# Patient Record
Sex: Male | Born: 1940 | Race: White | Hispanic: No | Marital: Married | Smoking: Former smoker
Health system: Southern US, Community
[De-identification: ages and names within clinical notes are randomized; demographics above are authoritative.]

## PROBLEM LIST (undated history)

## (undated) DIAGNOSIS — E785 Hyperlipidemia, unspecified: Secondary | ICD-10-CM

## (undated) DIAGNOSIS — E119 Type 2 diabetes mellitus without complications: Secondary | ICD-10-CM

## (undated) DIAGNOSIS — R0600 Dyspnea, unspecified: Secondary | ICD-10-CM

## (undated) DIAGNOSIS — K635 Polyp of colon: Secondary | ICD-10-CM

## (undated) DIAGNOSIS — I219 Acute myocardial infarction, unspecified: Secondary | ICD-10-CM

## (undated) DIAGNOSIS — I739 Peripheral vascular disease, unspecified: Secondary | ICD-10-CM

## (undated) DIAGNOSIS — I10 Essential (primary) hypertension: Secondary | ICD-10-CM

## (undated) DIAGNOSIS — I2510 Atherosclerotic heart disease of native coronary artery without angina pectoris: Secondary | ICD-10-CM

## (undated) HISTORY — PX: ANGIOPLASTY: SHX39

## (undated) HISTORY — DX: Essential (primary) hypertension: I10

## (undated) HISTORY — DX: Atherosclerotic heart disease of native coronary artery without angina pectoris: I25.10

## (undated) HISTORY — DX: Type 2 diabetes mellitus without complications: E11.9

## (undated) HISTORY — DX: Hyperlipidemia, unspecified: E78.5

## (undated) HISTORY — DX: Polyp of colon: K63.5

## (undated) HISTORY — PX: PTCA: SHX146

## (undated) HISTORY — PX: CATARACT EXTRACTION: SUR2

## (undated) HISTORY — DX: Peripheral vascular disease, unspecified: I73.9

---

## 2002-02-14 LAB — HM COLONOSCOPY

## 2004-04-22 ENCOUNTER — Ambulatory Visit: Payer: Self-pay | Admitting: Family Medicine

## 2004-04-27 ENCOUNTER — Ambulatory Visit: Payer: Self-pay | Admitting: Family Medicine

## 2004-05-04 ENCOUNTER — Ambulatory Visit: Payer: Self-pay | Admitting: Family Medicine

## 2004-05-25 ENCOUNTER — Ambulatory Visit: Payer: Self-pay | Admitting: Family Medicine

## 2004-10-21 ENCOUNTER — Ambulatory Visit: Payer: Self-pay | Admitting: Family Medicine

## 2004-10-27 ENCOUNTER — Ambulatory Visit: Payer: Self-pay | Admitting: Family Medicine

## 2004-11-03 ENCOUNTER — Ambulatory Visit: Payer: Self-pay | Admitting: Family Medicine

## 2005-01-26 ENCOUNTER — Ambulatory Visit: Payer: Self-pay | Admitting: Family Medicine

## 2005-02-15 ENCOUNTER — Ambulatory Visit: Payer: Self-pay | Admitting: Family Medicine

## 2005-09-03 ENCOUNTER — Ambulatory Visit: Payer: Self-pay | Admitting: Family Medicine

## 2005-10-15 ENCOUNTER — Ambulatory Visit: Payer: Self-pay | Admitting: Family Medicine

## 2005-10-18 ENCOUNTER — Encounter: Payer: Self-pay | Admitting: Family Medicine

## 2005-10-18 LAB — CONVERTED CEMR LAB: PSA: 0.5 ng/mL

## 2005-11-19 ENCOUNTER — Ambulatory Visit: Payer: Self-pay | Admitting: Family Medicine

## 2005-12-14 ENCOUNTER — Ambulatory Visit: Payer: Self-pay | Admitting: Family Medicine

## 2006-02-18 ENCOUNTER — Ambulatory Visit: Payer: Self-pay | Admitting: Family Medicine

## 2006-03-04 ENCOUNTER — Ambulatory Visit: Payer: Self-pay | Admitting: Family Medicine

## 2006-03-24 ENCOUNTER — Ambulatory Visit: Payer: Self-pay | Admitting: Family Medicine

## 2006-04-06 ENCOUNTER — Ambulatory Visit: Payer: Self-pay | Admitting: Family Medicine

## 2006-05-05 ENCOUNTER — Ambulatory Visit: Payer: Self-pay | Admitting: Family Medicine

## 2006-06-23 ENCOUNTER — Ambulatory Visit: Payer: Self-pay | Admitting: Family Medicine

## 2006-06-23 LAB — CONVERTED CEMR LAB
BUN: 15 mg/dL (ref 6–23)
Creatinine, Ser: 1.2 mg/dL (ref 0.4–1.5)
Direct LDL: 54 mg/dL
Hgb A1c MFr Bld: 11.2 % — ABNORMAL HIGH (ref 4.6–6.0)
Potassium: 4.9 meq/L (ref 3.5–5.1)
Triglycerides: 211 mg/dL (ref 0–149)

## 2006-07-07 ENCOUNTER — Ambulatory Visit: Payer: Self-pay | Admitting: Endocrinology

## 2006-07-21 ENCOUNTER — Ambulatory Visit: Payer: Self-pay | Admitting: Endocrinology

## 2006-08-11 ENCOUNTER — Ambulatory Visit: Payer: Self-pay | Admitting: Endocrinology

## 2006-08-31 ENCOUNTER — Ambulatory Visit: Payer: Self-pay | Admitting: Cardiology

## 2006-09-08 ENCOUNTER — Ambulatory Visit: Payer: Self-pay

## 2006-09-08 ENCOUNTER — Ambulatory Visit: Payer: Self-pay | Admitting: Endocrinology

## 2006-10-13 ENCOUNTER — Ambulatory Visit: Payer: Self-pay | Admitting: Cardiology

## 2006-10-20 ENCOUNTER — Ambulatory Visit: Payer: Self-pay | Admitting: Endocrinology

## 2006-11-03 ENCOUNTER — Encounter: Payer: Self-pay | Admitting: Family Medicine

## 2006-11-03 DIAGNOSIS — E119 Type 2 diabetes mellitus without complications: Secondary | ICD-10-CM

## 2006-11-03 DIAGNOSIS — E785 Hyperlipidemia, unspecified: Secondary | ICD-10-CM

## 2006-11-03 DIAGNOSIS — I10 Essential (primary) hypertension: Secondary | ICD-10-CM

## 2006-11-03 HISTORY — DX: Essential (primary) hypertension: I10

## 2006-11-03 HISTORY — DX: Hyperlipidemia, unspecified: E78.5

## 2006-11-03 HISTORY — DX: Type 2 diabetes mellitus without complications: E11.9

## 2006-12-02 ENCOUNTER — Ambulatory Visit: Payer: Self-pay | Admitting: Endocrinology

## 2006-12-02 LAB — CONVERTED CEMR LAB: Hgb A1c MFr Bld: 8.8 % — ABNORMAL HIGH (ref 4.6–6.0)

## 2006-12-16 ENCOUNTER — Encounter: Payer: Self-pay | Admitting: Endocrinology

## 2007-01-09 ENCOUNTER — Encounter: Payer: Self-pay | Admitting: Family Medicine

## 2007-03-03 ENCOUNTER — Ambulatory Visit: Payer: Self-pay | Admitting: Endocrinology

## 2007-03-03 LAB — CONVERTED CEMR LAB: Hgb A1c MFr Bld: 8.9 % — ABNORMAL HIGH (ref 4.6–6.0)

## 2007-05-22 ENCOUNTER — Encounter: Payer: Self-pay | Admitting: Family Medicine

## 2007-06-01 ENCOUNTER — Ambulatory Visit: Payer: Self-pay | Admitting: Family Medicine

## 2007-06-01 DIAGNOSIS — M79609 Pain in unspecified limb: Secondary | ICD-10-CM | POA: Insufficient documentation

## 2007-06-01 DIAGNOSIS — J069 Acute upper respiratory infection, unspecified: Secondary | ICD-10-CM | POA: Insufficient documentation

## 2007-06-01 LAB — CONVERTED CEMR LAB: Uric Acid, Serum: 4.3 mg/dL (ref 2.4–7.0)

## 2007-06-21 ENCOUNTER — Telehealth (INDEPENDENT_AMBULATORY_CARE_PROVIDER_SITE_OTHER): Payer: Self-pay | Admitting: *Deleted

## 2007-06-23 ENCOUNTER — Encounter: Payer: Self-pay | Admitting: Endocrinology

## 2007-07-07 ENCOUNTER — Ambulatory Visit: Payer: Self-pay | Admitting: Endocrinology

## 2007-07-07 LAB — CONVERTED CEMR LAB: Hgb A1c MFr Bld: 9.8 % — ABNORMAL HIGH

## 2007-08-31 ENCOUNTER — Telehealth (INDEPENDENT_AMBULATORY_CARE_PROVIDER_SITE_OTHER): Payer: Self-pay | Admitting: *Deleted

## 2007-08-31 ENCOUNTER — Encounter: Payer: Self-pay | Admitting: Endocrinology

## 2007-09-22 ENCOUNTER — Ambulatory Visit: Payer: Self-pay | Admitting: Endocrinology

## 2007-09-22 LAB — CONVERTED CEMR LAB: Hgb A1c MFr Bld: 11.2 % — ABNORMAL HIGH (ref 4.6–6.0)

## 2007-10-02 ENCOUNTER — Encounter: Payer: Self-pay | Admitting: Endocrinology

## 2007-11-10 ENCOUNTER — Ambulatory Visit: Payer: Self-pay | Admitting: Endocrinology

## 2008-01-10 ENCOUNTER — Ambulatory Visit: Payer: Self-pay | Admitting: Endocrinology

## 2008-01-11 ENCOUNTER — Encounter: Payer: Self-pay | Admitting: Endocrinology

## 2008-01-11 LAB — CONVERTED CEMR LAB: Hgb A1c MFr Bld: 9.9 % — ABNORMAL HIGH (ref 4.6–6.0)

## 2008-01-12 ENCOUNTER — Ambulatory Visit: Payer: Self-pay | Admitting: Endocrinology

## 2008-01-16 ENCOUNTER — Encounter: Payer: Self-pay | Admitting: Endocrinology

## 2008-02-01 ENCOUNTER — Encounter: Payer: Self-pay | Admitting: Endocrinology

## 2008-03-20 ENCOUNTER — Ambulatory Visit: Payer: Self-pay | Admitting: Endocrinology

## 2008-03-20 LAB — CONVERTED CEMR LAB: Hgb A1c MFr Bld: 11.7 % — ABNORMAL HIGH (ref 4.6–6.0)

## 2008-03-22 ENCOUNTER — Ambulatory Visit: Payer: Self-pay | Admitting: Endocrinology

## 2008-03-25 ENCOUNTER — Encounter: Payer: Self-pay | Admitting: Endocrinology

## 2008-05-20 ENCOUNTER — Ambulatory Visit: Payer: Self-pay | Admitting: Endocrinology

## 2008-06-07 ENCOUNTER — Ambulatory Visit: Payer: Self-pay | Admitting: Endocrinology

## 2008-07-26 ENCOUNTER — Ambulatory Visit: Payer: Self-pay | Admitting: Endocrinology

## 2008-07-26 LAB — CONVERTED CEMR LAB: Hgb A1c MFr Bld: 11 % — ABNORMAL HIGH (ref 4.6–6.0)

## 2008-09-06 ENCOUNTER — Ambulatory Visit: Payer: Self-pay | Admitting: Endocrinology

## 2008-11-08 ENCOUNTER — Ambulatory Visit: Payer: Self-pay | Admitting: Endocrinology

## 2008-11-08 LAB — CONVERTED CEMR LAB
ALT: 43 units/L (ref 0–53)
AST: 32 units/L (ref 0–37)
Albumin: 3.9 g/dL (ref 3.5–5.2)
Alkaline Phosphatase: 54 units/L (ref 39–117)
BUN: 18 mg/dL (ref 6–23)
Bilirubin, Direct: 0.1 mg/dL (ref 0.0–0.3)
CO2: 29 meq/L (ref 19–32)
Calcium: 9.7 mg/dL (ref 8.4–10.5)
Chloride: 102 meq/L (ref 96–112)
Cholesterol: 176 mg/dL (ref 0–200)
Creatinine, Ser: 1.3 mg/dL (ref 0.4–1.5)
Direct LDL: 84.9 mg/dL
GFR calc non Af Amer: 58.63 mL/min (ref >60)
Glucose, Bld: 167 mg/dL — ABNORMAL HIGH (ref 70–99)
HDL: 42.5 mg/dL (ref >39.00)
Hgb A1c MFr Bld: 10.2 % — ABNORMAL HIGH (ref 4.6–6.5)
Potassium: 4.8 meq/L (ref 3.5–5.1)
Sodium: 139 meq/L (ref 135–145)
Total Bilirubin: 0.8 mg/dL (ref 0.3–1.2)
Total CHOL/HDL Ratio: 4
Total Protein: 7.2 g/dL (ref 6.0–8.3)
Triglycerides: 304 mg/dL — ABNORMAL HIGH (ref 0.0–149.0)
VLDL: 60.8 mg/dL — ABNORMAL HIGH (ref 0.0–40.0)

## 2008-12-27 ENCOUNTER — Ambulatory Visit: Payer: Self-pay | Admitting: Family Medicine

## 2008-12-27 LAB — HM DIABETES FOOT EXAM

## 2009-01-27 ENCOUNTER — Encounter (INDEPENDENT_AMBULATORY_CARE_PROVIDER_SITE_OTHER): Payer: Self-pay | Admitting: *Deleted

## 2009-02-14 ENCOUNTER — Ambulatory Visit: Payer: Self-pay | Admitting: Endocrinology

## 2009-02-14 LAB — CONVERTED CEMR LAB: Hgb A1c MFr Bld: 8.9 % — ABNORMAL HIGH (ref 4.6–6.5)

## 2009-02-20 ENCOUNTER — Encounter: Payer: Self-pay | Admitting: Endocrinology

## 2009-03-13 ENCOUNTER — Telehealth: Payer: Self-pay | Admitting: Endocrinology

## 2009-03-13 ENCOUNTER — Encounter: Payer: Self-pay | Admitting: Endocrinology

## 2009-03-21 ENCOUNTER — Ambulatory Visit: Payer: Self-pay | Admitting: Endocrinology

## 2009-06-18 ENCOUNTER — Telehealth: Payer: Self-pay | Admitting: Endocrinology

## 2009-06-27 ENCOUNTER — Ambulatory Visit: Payer: Self-pay | Admitting: Endocrinology

## 2009-06-27 LAB — CONVERTED CEMR LAB: Hgb A1c MFr Bld: 10 % — ABNORMAL HIGH (ref 4.6–6.5)

## 2009-09-26 ENCOUNTER — Ambulatory Visit: Payer: Self-pay | Admitting: Endocrinology

## 2009-09-26 LAB — CONVERTED CEMR LAB: Hgb A1c MFr Bld: 9.3 % — ABNORMAL HIGH (ref 4.6–6.5)

## 2009-10-07 ENCOUNTER — Telehealth: Payer: Self-pay | Admitting: Internal Medicine

## 2009-10-15 LAB — HM DIABETES EYE EXAM

## 2009-10-31 ENCOUNTER — Ambulatory Visit: Payer: Self-pay | Admitting: Endocrinology

## 2009-12-01 ENCOUNTER — Telehealth: Payer: Self-pay | Admitting: Family Medicine

## 2009-12-19 ENCOUNTER — Ambulatory Visit: Payer: Self-pay | Admitting: Endocrinology

## 2009-12-19 LAB — CONVERTED CEMR LAB: Hgb A1c MFr Bld: 10.3 % — ABNORMAL HIGH (ref 4.6–6.5)

## 2010-02-18 ENCOUNTER — Encounter: Payer: Self-pay | Admitting: Endocrinology

## 2010-02-20 ENCOUNTER — Telehealth: Payer: Self-pay | Admitting: Endocrinology

## 2010-02-23 ENCOUNTER — Telehealth: Payer: Self-pay | Admitting: Family Medicine

## 2010-03-05 ENCOUNTER — Telehealth: Payer: Self-pay | Admitting: Endocrinology

## 2010-03-09 ENCOUNTER — Telehealth: Payer: Self-pay | Admitting: Endocrinology

## 2010-03-10 ENCOUNTER — Telehealth: Payer: Self-pay | Admitting: Family Medicine

## 2010-03-13 ENCOUNTER — Encounter: Payer: Self-pay | Admitting: Endocrinology

## 2010-03-27 ENCOUNTER — Ambulatory Visit: Payer: Self-pay | Admitting: Endocrinology

## 2010-03-27 LAB — CONVERTED CEMR LAB: Hgb A1c MFr Bld: 10.1 % — ABNORMAL HIGH (ref 4.6–6.5)

## 2010-06-01 ENCOUNTER — Telehealth: Payer: Self-pay | Admitting: Endocrinology

## 2010-06-14 LAB — CONVERTED CEMR LAB
Basophils Absolute: 0.1 10*3/uL (ref 0.0–0.1)
Basophils Relative: 0.8 % (ref 0.0–3.0)
Bilirubin Urine: NEGATIVE
Blood in Urine, dipstick: NEGATIVE
Creatinine,U: 288.7 mg/dL
Eosinophils Absolute: 0.4 10*3/uL (ref 0.0–0.7)
Eosinophils Relative: 3.7 % (ref 0.0–5.0)
Glucose, Urine, Semiquant: NEGATIVE
HCT: 45.7 % (ref 39.0–52.0)
Hemoglobin: 15.7 g/dL (ref 13.0–17.0)
Ketones, urine, test strip: NEGATIVE
Lymphocytes Relative: 24.7 % (ref 12.0–46.0)
Lymphs Abs: 3 10*3/uL (ref 0.7–4.0)
MCHC: 34.2 g/dL (ref 30.0–36.0)
MCV: 88.3 fL (ref 78.0–100.0)
Microalb Creat Ratio: 49.5 mg/g — ABNORMAL HIGH (ref 0.0–30.0)
Microalb, Ur: 14.3 mg/dL — ABNORMAL HIGH (ref 0.0–1.9)
Monocytes Absolute: 1.3 10*3/uL — ABNORMAL HIGH (ref 0.1–1.0)
Monocytes Relative: 11.1 % (ref 3.0–12.0)
Neutro Abs: 7.3 10*3/uL (ref 1.4–7.7)
Neutrophils Relative %: 59.7 % (ref 43.0–77.0)
Nitrite: NEGATIVE
PSA: 0.79 ng/mL (ref 0.10–4.00)
Platelets: 225 10*3/uL (ref 150.0–400.0)
RBC: 5.18 M/uL (ref 4.22–5.81)
RDW: 13.7 % (ref 11.5–14.6)
Specific Gravity, Urine: >=1.03
TSH: 2.8 microintl units/mL (ref 0.35–5.50)
Urobilinogen, UA: 0.2
WBC Urine, dipstick: NEGATIVE
WBC: 12.1 10*3/uL — ABNORMAL HIGH (ref 4.5–10.5)
pH: 5

## 2010-06-18 NOTE — Assessment & Plan Note (Signed)
Summary: 2 WK ROV /NWS $50   Vital Signs:  Patient profile:   70 year old male Height:      69 inches Weight:      229 pounds BMI:     33.94 Temp:     96.7 degrees F oral Pulse rate:   98 / minute BP sitting:   138 / 72  (left arm) Cuff size:   large  Vitals Entered By: Charlynne Cousins CMA (September 06, 2008 7:59 AM) CC: follow-up visit, pt co blood sugar still running high in the am   Primary Provider:  Dr. Sherren Mocha  CC:  follow-up visit and pt co blood sugar still running high in the am.  History of Present Illness: pt takes novolin 70/30 100 units am and 65 pm.  he has mild hypoglycemia between breakfast and lunch, when he is active.  no cbg record, but says it is over 200.  he feels well in general.  Current Medications (verified): 1)  Ramipril 10 Mg  Caps (Ramipril) .... Take 1 By Mouth Two Times A Day Qd 2)  Amlodipine Besylate 10 Mg  Tabs (Amlodipine Besylate) .... Take 1 By Mouth Qd 3)  One Touch Ultra Test Strips .... Uad 4)  Adult Aspirin Low Strength 81 Mg  Tbdp (Aspirin) 5)  Zocor 40 Mg  Tabs (Simvastatin) .... Q.h.s. 6)  Metoprolol Tartrate 25 Mg  Tabs (Metoprolol Tartrate) .... Take 1 By Mouth Two Times A Day Qd 7)  100 Unit Insulin Syringes .... Once A Day 8)  Novolin 70/30 70-30 % Susp (Insulin Isophane & Regular) .Marland Kitchen.. 100 Units Am and 65 Units Pm  Allergies: No Known Drug Allergies  Past History:  Past Medical History:    PN    PROTEINURIA    CAD    FOOT PAIN, RIGHT (ICD-729.5)    VIRAL URI (ICD-465.9)    HYPERLIPIDEMIA (ICD-272.4)    HYPERTENSION (ICD-401.9)    DIABETES MELLITUS, TYPE II (ICD-250.00) (01/12/2008)  Review of Systems  The patient denies syncope.    Physical Exam  Skin:  insulin injection sites at anterior abdomen are normal    Impression & Recommendations:  Problem # 1:  DIABETES MELLITUS, TYPE II (ICD-250.00) needs increased rx  Medications Added to Medication List This Visit: 1)  Novolin 70/30 70-30 % Susp (Insulin isophane &  regular) .Marland Kitchen.. 100 units am and 65 units pm  Other Orders: Est. Patient Level III [id number])  Patient Instructions: 1)  increase novolin 70/30 to 100 units am (80 if you are going to be active), and 65 units pm. 2)  check your blood glucose 3 times a day.  vary the time of day between before the 3 meals and at bedtime.  also check if you feel as though your glucose might be very high or too low.  bring a record of this to your doctor appointments. 3)  Please schedule a follow-up appointment in 2 months.

## 2010-06-18 NOTE — Miscellaneous (Signed)
  Clinical Lists Changes  Medications: Added new medication of * ONE TOUCH ULTRA TEST STRIPS uad - Signed Rx of ONE TOUCH ULTRA TEST STRIPS uad;  #100 x 4;  Signed;  Entered by: Vilinda Blanks, RN;  Authorized by: Dorena Cookey MD;  Method used: Telephoned to [facility]  [phone]*, [address] [place], [place]  [address], Ph: [phone] or [phone], Fax: [phone]    Prescriptions: ONE TOUCH ULTRA TEST STRIPS uad  #100 x 4   Entered by:   Vilinda Blanks, RN   Authorized by:   Dorena Cookey MD   Signed by:   Vilinda Blanks, RN on 01/09/2007   Method used:   Telephoned to ...       [facility]  [phone]*       [address]       [place], [place]  [address]       Ph: [phone] or [phone]       Fax: [phone]   RxID:   [id number]

## 2010-06-18 NOTE — Progress Notes (Signed)
Summary: Rx refill req  Phone Note Refill Request Message from:  Patient on June 01, 2010 9:24 AM  Refills Requested: Medication #1:  AMLODIPINE BESYLATE 10 MG  TABS take 1 by mouth qd   Dosage confirmed as above?Dosage Confirmed   Supply Requested: 6 months  Method Requested: Electronic Initial call taken by: Crissie Sickles, CMA,  June 01, 2010 9:24 AM    Prescriptions: AMLODIPINE BESYLATE 10 MG  TABS (AMLODIPINE BESYLATE) take 1 by mouth qd  #90 x 1   Entered by:   Crissie Sickles, CMA   Authorized by:   Donavan Foil MD   Signed by:   Crissie Sickles, CMA on 06/01/2010   Method used:   Electronically to        [facility] (retail)       [address]       [place], [place]  [address]       Ph: [phone] or [phone]       Fax: [phone]   RxID:   [id number]

## 2010-06-18 NOTE — Letter (Signed)
Summary: denial for pt assistance/[employer]  denial for pt assistance/[employer]   Imported By: Bubba Hales 02/21/2008 08:08:31  _____________________________________________________________________  External Attachment:    Type:   Image     Comment:   External Document

## 2010-06-18 NOTE — Assessment & Plan Note (Signed)
Summary: SUGAR IS HIGH/ MED IS NOT WORKING /NWS $50   Vital Signs:  Patient Profile:   70 Years Old Male Weight:      227.8 pounds O2 Sat:      97 % O2 treatment:    Room Air Temp:     97.2 degrees F oral Pulse rate:   80 / minute BP sitting:   140 / 72  (left arm) Cuff size:   regular  Pt. in pain?   no  Vitals Entered By: Tomma Lightning (May 20, 2008 2:38 PM)                  PCP:  Dr. Sherren Mocha  Chief Complaint:  BLOOD SUGAR RUNNING HIG IN THE 200'S.  History of Present Illness: pt says cbg's are high-100's to 200's, with no pattern throughout the day.  he feels well.    Prior Medications Reviewed Using: Patient Recall  Updated Prior Medication List: RAMIPRIL 10 MG  CAPS (RAMIPRIL) take 1 by mouth two times a day qd AMLODIPINE BESYLATE 10 MG  TABS (AMLODIPINE BESYLATE) take 1 by mouth qd * ONE TOUCH ULTRA TEST STRIPS uad ADULT ASPIRIN LOW STRENGTH 81 MG  TBDP (ASPIRIN)  ZOCOR 40 MG  TABS (SIMVASTATIN) q.h.s. METOPROLOL TARTRATE 25 MG  TABS (METOPROLOL TARTRATE) take 1 by mouth two times a day qd * 100 UNIT INSULIN SYRINGES once a day NOVOLIN 70/30 70-30 % SUSP (INSULIN ISOPHANE & REGULAR) 70 units am and 30 units pm  Current Allergies: No known allergies   Past Medical History:    Reviewed history from 01/12/2008 and no changes required:       PN       PROTEINURIA       CAD       FOOT PAIN, RIGHT (ICD-729.5)       VIRAL URI (ICD-465.9)       HYPERLIPIDEMIA (ICD-272.4)       HYPERTENSION (ICD-401.9)       DIABETES MELLITUS, TYPE II (ICD-250.00)     Review of Systems  The patient denies hypoglycemia.     Physical Exam  General:     obese.   Pulses:     dorsalis pedis intact bilat.   Extremities:     no deformity.  no ulcer on the feet.  feet are of normal color and temp.  no edema  Neurologic:     sensation is intact to touch on the feet     Impression & Recommendations:  Problem # 1:  DIABETES MELLITUS, TYPE II (ICD-250.00) needs  increased rx   Medications Added to Medication List This Visit: 1)  Novolin 70/30 70-30 % Susp (Insulin isophane & regular) .... 80 units am and 40 units pm   Patient Instructions: 1)  increase novolin 70/30 to 80 units am and 40 units pm 2)  continue to increase if glucoses are over 200 3)  ret 14d   ]

## 2010-06-18 NOTE — Progress Notes (Signed)
Summary: rx refill req  Phone Note Refill Request Message from:  Fax from Pharmacy on March 05, 2010 3:31 PM  Refills Requested: Medication #1:  LISINOPRIL 20 MG TABS Take 1 tablet by mouth every morning   Dosage confirmed as above?Dosage Confirmed   Last Refilled: 12/01/2009  Medication #2:  ZOCOR 40 MG  TABS q.h.s.   Dosage confirmed as above?Dosage Confirmed   Last Refilled: 12/01/2009  Medication #3:  METOPROLOL TARTRATE 50 MG TABS Take 1 tablet by mouth every morning   Dosage confirmed as above?Dosage Confirmed   Last Refilled: 12/01/2009  Method Requested: Fax to [facility] Initial call taken by: Rebeca Alert MA,  March 05, 2010 3:32 PM    Prescriptions: METOPROLOL TARTRATE 50 MG TABS (METOPROLOL TARTRATE) Take 1 tablet by mouth every morning  #90 x 0   Entered by:   Rebeca Alert MA   Authorized by:   Donavan Foil MD   Signed by:   Rebeca Alert MA on 03/05/2010   Method used:   Faxed to ...       [facility]             , [place]         Ph:        Fax: [phone]   RxID:   [id number] ZOCOR 40 MG  TABS (SIMVASTATIN) q.h.s.  #90 x 0   Entered by:   Rebeca Alert MA   Authorized by:   Donavan Foil MD   Signed by:   Rebeca Alert MA on 03/05/2010   Method used:   Faxed to ...       [facility]order)             , [place]         Ph:        Fax: [phone]   RxID:   [id number] LISINOPRIL 20 MG TABS (LISINOPRIL) Take 1 tablet by mouth every morning  #90 Tablet x 0   Entered by:   Rebeca Alert MA   Authorized by:   Donavan Foil MD   Signed by:   Rebeca Alert MA on 03/05/2010   Method used:   Faxed to ...       [facility]order)             , [place]         Ph:        Fax: [phone]   RxID:   [id number]

## 2010-06-18 NOTE — Medication Information (Signed)
Summary: Diabetic supplies/[facility]  Diabetic supplies/[facility]   Imported By: Bubba Hales 03/19/2009 10:41:04  _____________________________________________________________________  External Attachment:    Type:   Image     Comment:   External Document

## 2010-06-18 NOTE — Assessment & Plan Note (Signed)
Summary: per pt 1 1/2 mth fu---d/t  ---stc   Vital Signs:  Patient profile:   70 year old male Height:      69 inches (175.26 cm) Weight:      225.4 pounds (102.45 kg) BMI:     33.41 O2 Sat:      97 % Temp:     96.7 degrees F (35.94 degrees C) oral Pulse rate:   75 / minute BP sitting:   132 / 70  (left arm) Cuff size:   regular  Vitals Entered By: Tomma Lightning (July 26, 2008 8:08 AM)   Primary Provider:  Dr. Sherren Mocha   History of Present Illness: pt had to again increase his insulin due to cbg's over 200 at all times of day.  there is no pattern throughout the day.  he feels well in general.    Current Medications (verified): 1)  Ramipril 10 Mg  Caps (Ramipril) .... Take 1 By Mouth Two Times A Day Qd 2)  Amlodipine Besylate 10 Mg  Tabs (Amlodipine Besylate) .... Take 1 By Mouth Qd 3)  One Touch Ultra Test Strips .... Uad 4)  Adult Aspirin Low Strength 81 Mg  Tbdp (Aspirin) 5)  Zocor 40 Mg  Tabs (Simvastatin) .... Q.h.s. 6)  Metoprolol Tartrate 25 Mg  Tabs (Metoprolol Tartrate) .... Take 1 By Mouth Two Times A Day Qd 7)  100 Unit Insulin Syringes .... Once A Day 8)  Novolin 70/30 70-30 % Susp (Insulin Isophane & Regular) .... 95 Units Am and 40 Units Pm  Allergies (verified): No Known Drug Allergies  Past History  Past Medical History: PN PROTEINURIA CAD FOOT PAIN, RIGHT (ICD-729.5) VIRAL URI (ICD-465.9) HYPERLIPIDEMIA (ICD-272.4) HYPERTENSION (ICD-401.9) DIABETES MELLITUS, TYPE II (ICD-250.00) (01/12/2008)  Review of Systems  The patient denies hypoglycemia.    Physical Exam  General:  obese.   Pulses:  dorsalis pedis intact bilat.   Extremities:  no deformity.  no ulcer on the feet.  feet are of normal color and temp.  no edema  Neurologic:  sensation is intact to touch on the feet  Additional Exam:  he brings a record of his cbg's which i have reviewed today.  A1C%                 [H]  11.0 %        Impression & Recommendations:  Problem # 1:   DIABETES MELLITUS, TYPE II (ICD-250.00) needs increased rx  Medications Added to Medication List This Visit: 1)  Novolin 70/30 70-30 % Susp (Insulin isophane & regular) .Marland Kitchen.. 100 units am and 50 units pm  Other Orders: TLB-A1C / Hgb A1C (Glycohemoglobin) (83036-A1C) Est. Patient Level III [id number])  Patient Instructions: 1)  increase novolin 70/30 to 100 units am and 50 units pm 2)  continue to increase until glucose at some time of day is low-100's 3)  ret 6 weeks 4)  check your blood glucose 1-2 times a day.  vary the time of day between before the 3 meals and at bedtime.  also check if you feel as though your glucose might be very high or too low.  bring a record of this to your doctor appointments. 5)  Please take Aspirin 81 mg once a day to decrease your risk of heart attack or stroke.  Let us know if you get stomach upset or bleeding problems.   6)  Call if you are having sugars under 70. Prescriptions: NOVOLIN 70/30 70-30 % SUSP (INSULIN ISOPHANE &  REGULAR) 100 units am and 50 units pm  #5 vials x 11   Entered and Authorized by:   Donavan Foil MD   Signed by:   Donavan Foil MD on 07/26/2008   Method used:   Electronically to        [facility]  [phone]* (retail)       [address]       [place], [place]  [address]       Ph: [phone] or [phone]       Fax: [phone]   RxID:   [id number]

## 2010-06-18 NOTE — Progress Notes (Signed)
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 pt states he is a diabetic and it wont be appropriate for him  to wear the shoes due to him being diabetic, pt requesting a written statement from dr ellsion stating he is a diabetic and the shoes would not be appropriate. ........Emeric Zea made aware that dr ellsion is out till monday 09-04-07 Initial call taken by: Nonah Mattes,  August 31, 2007 1:40 PM  Follow-up for Phone Call        i did letter Follow-up by: Donavan Foil MD,  August 31, 2007 8:25 PM  Additional Follow-up for Phone Call Additional follow up Details #1::        September 01, 2007 9:14 AM  called pt to inform that noter is ready and it will be put at front desk for pick up . print letter off ,put up at front desk  Additional Follow-up by: Nonah Mattes,  September 01, 2007 9:15 AM

## 2010-06-18 NOTE — Assessment & Plan Note (Signed)
Summary: 77 WK ROV /NWS $50   Vital Signs:  Patient Profile:   70 Years Old Male Weight:      220.25 pounds Temp:     97.1 degrees F oral Pulse rate:   88 / minute BP sitting:   140 / 79  (right arm)  Vitals Entered By: Jiles Garter (November 10, 2007 7:57 AM)                 PCP:  Dr. Sherren Mocha  Chief Complaint:  Follow up Diabetes Management.  History of Present Illness: Currently takes Lantus 90 units daily, and Humalog 10 units with each meal. On this schedule, all of his glucoses are in the 100s. In general, it's lowest in the morning.  feels well in general    Current Allergies (reviewed today): No known allergies   Past Medical History:    Reviewed history from 11/03/2006 and no changes required:       Diabetes mellitus, type II       Hypertension       Hyperlipidemia       PN       PROTEINURIA       CAD     Review of Systems       No hypoglycemia since he's been on the schedule   Physical Exam  General:     obese.   Pulses:     dorsalis pedis intact bilat.  Extremities:     no deformity.  no ulcer on the feet.  feet are of normal color and temp.  no edema  Neurologic:     sensation is intact to touch on the feet     Impression & Recommendations:  Problem # 1:  DIABETES MELLITUS, TYPE II (ICD-250.00)  Orders: Est. Patient Level III [id number])   Medications Added to Medication List This Visit: 1)  Humalog 100 Unit/ml Soln (Insulin lispro (human)) .Marland Kitchen.. 10units three times a day 2)  100 Unit Insulin Syringes  .... Once a day   Patient Instructions: 1)  Same insulin 2)  A1c Just prior to next appointment, in 2 months, 250.00   Prescriptions: 100 UNIT INSULIN SYRINGES once a day  #50 x 11   Entered and Authorized by:   Donavan Foil MD   Signed by:   Donavan Foil MD on 11/10/2007   Method used:   Electronically sent to ...       [facility]  [phone]*       [place], [place]  [address]       Ph: [phone] or [phone]       Fax: [phone]   RxID:   [id number]  ] Current Allergies (reviewed today): No known allergies

## 2010-06-18 NOTE — Assessment & Plan Note (Signed)
Summary: 2 mth fu $50 stc   Vital Signs:  Patient Profile:   70 Years Old Male Weight:      222.8 pounds Temp:     97.8 degrees F oral Pulse rate:   90 / minute BP sitting:   146 / 82  (left arm) Cuff size:   regular  Pt. in pain?   no  Vitals Entered By: Tomma Lightning (January 12, 2008 8:05 AM)              Is Patient Diabetic? Yes      PCP:  Dr. Sherren Mocha  Chief Complaint:  2 month follow-up.  History of Present Illness: feels well.  has hypoglycemia very seldom, and is unable to completely desribe the circumstances of these episodes.      Current Allergies: No known allergies   Past Medical History:    Reviewed history from 11/03/2006 and no changes required:       PN       PROTEINURIA       CAD       FOOT PAIN, RIGHT (ICD-729.5)       VIRAL URI (ICD-465.9)       HYPERLIPIDEMIA (ICD-272.4)       HYPERTENSION (ICD-401.9)       DIABETES MELLITUS, TYPE II (ICD-250.00)     Review of Systems  The patient denies syncope.     Physical Exam  General:     well developed, well nourished, in no acute distress Neck:     no masses, thyromegaly, or abnormal cervical nodes    Impression & Recommendations:  Problem # 1:  DIABETES MELLITUS, TYPE II (ICD-250.00) Assessment: Improved HgbA1C: 9.9 (01/10/2008) Triglycerides: 211 (06/23/2006) Creatinine: 1.2 (06/23/2006) he would be better off with a simpler regimen, but he works 3rd shift   Patient Instructions: 1)  reduce lantus to 80/day 2)  increase humalog to 20 with each meal 3)  ret 3 mos with a1c prior 250.00   ]

## 2010-06-18 NOTE — Letter (Signed)
Summary: [facility]N for Diabetic Supplies/[facility]  [facility]N for Diabetic Supplies/[facility]   Imported By: Jerrye Noble D'jimraou 06/29/2007 15:52:56  _____________________________________________________________________  External Attachment:    Type:   Image     Comment:   External Document

## 2010-06-18 NOTE — Assessment & Plan Note (Signed)
Summary: 3 MTH FU---$50---STC   Vital Signs:  Patient Profile:   70 Years Old Male Weight:      224.4 pounds O2 Sat:      95 % O2 treatment:    Room Air Temp:     96.9 degrees F oral Pulse rate:   97 / minute BP sitting:   146 / 70  (left arm) Cuff size:   regular  Pt. in pain?   no  Vitals Entered By: Tomma Lightning (March 22, 2008 7:59 AM)                  PCP:  Dr. Sherren Mocha  Chief Complaint:  3 month follow-up.  History of Present Illness: pt made the changes i advised at last ov, but he disagreed with them, and went back to the previous dosage.  no cbg record, but says his cbg's are "all over 200."  feels well. he has gained weight since last ov. says the cost of meds is a primary concern to him.    Prior Medications Reviewed Using: Patient Recall  Updated Prior Medication List: RAMIPRIL 10 MG  CAPS (RAMIPRIL) take 1 by mouth two times a day qd AMLODIPINE BESYLATE 10 MG  TABS (AMLODIPINE BESYLATE) take 1 by mouth qd * ONE TOUCH ULTRA TEST STRIPS uad LANTUS 100 UNIT/ML  SOLN (INSULIN GLARGINE) 100 units qd HUMALOG 100 UNIT/ML  SOLN (INSULIN LISPRO (HUMAN)) 10units three times a day ADULT ASPIRIN LOW STRENGTH 81 MG  TBDP (ASPIRIN)  ZOCOR 40 MG  TABS (SIMVASTATIN) q.h.s. METOPROLOL TARTRATE 25 MG  TABS (METOPROLOL TARTRATE) take 1 by mouth two times a day qd * 100 UNIT INSULIN SYRINGES once a day  Current Allergies: No known allergies   Past Medical History:    Reviewed history from 01/12/2008 and no changes required:       PN       PROTEINURIA       CAD       FOOT PAIN, RIGHT (ICD-729.5)       VIRAL URI (ICD-465.9)       HYPERLIPIDEMIA (ICD-272.4)       HYPERTENSION (ICD-401.9)       DIABETES MELLITUS, TYPE II (ICD-250.00)     Review of Systems  The patient denies hypoglycemia.     Physical Exam  General:     well developed, well nourished, in no acute distress Psych:     alert and cooperative; normal mood and affect; normal attention span and  concentration    Impression & Recommendations:  Problem # 1:  DIABETES MELLITUS, TYPE II (ICD-250.00)  Medications Added to Medication List This Visit: 1)  Novolin 70/30 70-30 % Susp (Insulin isophane & regular) .... 70 units am and 30 units pm   Patient Instructions: 1)  change both current insulins to novolin 70/30 70 units am and 30 units pm. 2)  ret 30d with a record of your blood sugar.   Prescriptions: NOVOLIN 70/30 70-30 % SUSP (INSULIN ISOPHANE & REGULAR) 70 units am and 30 units pm  #3 vials x 11   Entered and Authorized by:   Donavan Foil MD   Signed by:   Donavan Foil MD on 03/22/2008   Method used:   Print then Give to Patient   RxID:   [id number] METOPROLOL TARTRATE 25 MG  TABS (METOPROLOL TARTRATE) take 1 by mouth two times a day qd  #60 x 11   Entered and Authorized by:   Hilliard Clark  Rupert Stacks MD   Signed by:   Donavan Foil MD on 03/22/2008   Method used:   Print then Give to Patient   RxID:   [id number] ZOCOR 40 MG  TABS (SIMVASTATIN) q.h.s.  #30 x 11   Entered and Authorized by:   Donavan Foil MD   Signed by:   Donavan Foil MD on 03/22/2008   Method used:   Print then Give to Patient   [id number] AMLODIPINE BESYLATE 10 MG  TABS (AMLODIPINE BESYLATE) take 1 by mouth qd  #30 x 11   Entered and Authorized by:   Donavan Foil MD   Signed by:   Donavan Foil MD on 03/22/2008   Method used:   Print then Give to Patient   RxID:   [id number] RAMIPRIL 10 MG  CAPS (RAMIPRIL) take 1 by mouth two times a day qd  #60 x 11   Entered and Authorized by:   Donavan Foil MD   Signed by:   Donavan Foil MD on 03/22/2008   Method used:   Print then Give to Patient   RxID:   [id number] NOVOLIN 70/30 70-30 % SUSP (INSULIN ISOPHANE & REGULAR) 70 units am and 30 units pm  #3 vials x 11   Entered and Authorized by:   Donavan Foil MD   Signed by:   Donavan Foil MD on 03/22/2008   Method used:   Print then Give to Patient   [id number]  ]

## 2010-06-18 NOTE — Letter (Signed)
Summary: [facility]  [facility]   Imported By: Phillis Knack 03/20/2010 08:26:20  _____________________________________________________________________  External Attachment:    Type:   Image     Comment:   External Document

## 2010-06-18 NOTE — Progress Notes (Signed)
Summary: refills  Phone Note Refill Request Message from:  Fax from Pharmacy on December 01, 2009 12:11 PM  Refills Requested: Medication #1:  ZOCOR 40 MG  TABS q.h.s.  Medication #2:  METOPROLOL TARTRATE 50 MG TABS Take 1 tablet by mouth every morning  Medication #3:  LISINOPRIL 20 MG TABS Take 1 tablet by mouth every morning Initial call taken by: Westley Hummer CMA Deborra Medina),  December 01, 2009 12:11 PM    Prescriptions: METOPROLOL TARTRATE 50 MG TABS (METOPROLOL TARTRATE) Take 1 tablet by mouth every morning  #90 x 0   Entered by:   Westley Hummer CMA ([place])   Authorized by:   Dorena Cookey MD   Signed by:   Westley Hummer CMA ([place]) on 12/01/2009   Method used:   Faxed to ...       [facility]             , [place]         Ph:        Fax: [phone]   RxID:   [id number] LISINOPRIL 20 MG TABS (LISINOPRIL) Take 1 tablet by mouth every morning  #90 x 0   Entered by:   Westley Hummer CMA ([place])   Authorized by:   Dorena Cookey MD   Signed by:   Westley Hummer CMA ([place]) on 12/01/2009   Method used:   Faxed to ...       [facility] ([facility]order)             , [place]         Ph:        Fax: [phone]   RxID:   [id number] ZOCOR 40 MG  TABS (SIMVASTATIN) q.h.s.  #90 x 0   Entered by:   Westley Hummer CMA ([place])   Authorized by:   Dorena Cookey MD   Signed by:   Westley Hummer CMA ([place]) on 12/01/2009   Method used:   Faxed to ...       [facility]order)             , [place]         Ph:        Fax: [phone]   RxID:   [id number]

## 2010-06-18 NOTE — Assessment & Plan Note (Signed)
Summary: 3 MTH FU  STC   Vital Signs:  Patient profile:   70 year old male Height:      67.5 inches (171.45 cm) Weight:      236 pounds (107.27 kg) O2 Sat:      97 % on Room air Temp:     97.0 degrees F (36.11 degrees C) oral Pulse rate:   76 / minute BP sitting:   132 / 70  (left arm) Cuff size:   large  Vitals Entered By: Gardenia Phlegm CMA (June 27, 2009 7:59 AM)  O2 Flow:  Room air CC: 3 month follow up/ CF Is Patient Diabetic? Yes   Primary Provider:  Dr. Sherren Mocha  CC:  3 month follow up/ CF.  History of Present Illness: no cbg record, but states cbg's are sometimes in the 70's in the early hours of the am.  pt states he feels well in general.  Current Medications (verified): 1)  Amlodipine Besylate 10 Mg  Tabs (Amlodipine Besylate) .... Take 1 By Mouth Qd 2)  One Touch Ultra Test Strips .... Uad 3)  Adult Aspirin Low Strength 81 Mg  Tbdp (Aspirin) 4)  Zocor 40 Mg  Tabs (Simvastatin) .... Q.h.s. 5)  100 Unit Insulin Syringes .... Once A Day 6)  Novolin 70/30 70-30 % Susp (Insulin Isophane & Regular) .Marland Kitchen.. 100 Units Bid 7)  Lisinopril 20 Mg Tabs (Lisinopril) .... Take 1 Tablet By Mouth Every Morning 8)  Metoprolol Tartrate 50 Mg Tabs (Metoprolol Tartrate) .... Take 1 Tablet By Mouth Every Morning  Allergies (verified): No Known Drug Allergies  Past History:  Past Medical History: Last updated: 01/12/2008 PN PROTEINURIA CAD FOOT PAIN, RIGHT (ICD-729.5) VIRAL URI (ICD-465.9) HYPERLIPIDEMIA (ICD-272.4) HYPERTENSION (ICD-401.9) DIABETES MELLITUS, TYPE II (ICD-250.00)  Review of Systems  The patient denies syncope.    Physical Exam  General:  obese.  no distress  Neck:  Supple without thyroid enlargement or tenderness. No cervical lymphadenopathy Additional Exam:  Hemoglobin A1C       [H]  10.0 %    Impression & Recommendations:  Problem # 1:  DIABETES MELLITUS, TYPE II (ICD-250.00) therapy is limited by 3rd shift work, and just two times a day  insulin.  Medications Added to Medication List This Visit: 1)  Novolin 70/30 70-30 % Susp (Insulin isophane & regular) .Marland Kitchen.. 110 units am and 90 units pm 2)  Bd Disp Needle 30g X 1" Misc (Needle (disp)) .... Any brand, bid  Other Orders: TLB-A1C / Hgb A1C (Glycohemoglobin) (83036-A1C)  Patient Instructions: 1)  take novolin 70/30, 110 units each am (80 if you are going to be active), and 90 units with the evening meal. 2)  tests are being ordered for you today.  a few days after the test(s), please call [phone] to hear your test results.   3)  check your blood sugar 2 times a day.  vary the time of day when you check, between before the 3 meals, and at bedtime.  also check if you have symptoms of your blood sugar being too high or too low.  please keep a record of the readings and bring it to your next appointment here.  please call us sooner if you are having low blood sugar episodes. 4)  Please schedule a follow-up appointment in 3 months. 5)  (update: i left message on phone-tree:  you should consider a once daily insulin, despite its greater cost.  it is critically important that you bring a record of cbg's  to dr appointments) Prescriptions: BD DISP NEEDLE 30G X 1" MISC (NEEDLE (DISP)) any brand, bid  #100 x 11   Entered and Authorized by:   Donavan Foil MD   Signed by:   Donavan Foil MD on 06/27/2009   Method used:   Electronically to        [facility]  [phone]* (retail)       [address]       [place], [place]  [address]       Ph: [phone] or [phone]       Fax: [phone]   RxID:   [id number] ZOCOR 40 MG  TABS (SIMVASTATIN) q.h.s.  #90 x 3   Entered and Authorized by:   Donavan Foil MD   Signed by:   Donavan Foil MD on 06/27/2009   Method used:   Print then Give to Patient   RxID:   [id number] AMLODIPINE BESYLATE 10 MG  TABS (AMLODIPINE BESYLATE) take 1 by mouth qd  #90 x 3   Entered and Authorized by:   Donavan Foil MD   Signed by:   Donavan Foil MD on 06/27/2009   Method used:   Print then Give to Patient   RxID:   [id number]

## 2010-06-18 NOTE — Miscellaneous (Signed)
  Medications Added METFORMIN HCL 1000 MG  TABS (METFORMIN HCL) take 1 by mouth two times a day ; CPX WHEN DUE; OV FOR RF'S       Clinical Lists Changes  Medications: Changed medication from METFORMIN HCL 1000 MG  TABS (METFORMIN HCL) take 1 by mouth two times a day qd to METFORMIN HCL 1000 MG  TABS (METFORMIN HCL) take 1 by mouth two times a day ; CPX WHEN DUE; OV FOR RF'S - Signed Rx of METFORMIN HCL 1000 MG  TABS (METFORMIN HCL) take 1 by mouth two times a day ; CPX WHEN DUE; OV FOR RF'S;  #100 x 0;  Signed;  Entered by: Vilinda Blanks, RN;  Authorized by: Dorena Cookey MD;  Method used: Electronic    Prescriptions: METFORMIN HCL 1000 MG  TABS (METFORMIN HCL) take 1 by mouth two times a day ; CPX WHEN DUE; OV FOR RF'S  #100 x 0   Entered by:   Vilinda Blanks, RN   Authorized by:   Dorena Cookey MD   Signed by:   Vilinda Blanks, RN on 05/22/2007   Method used:   Electronically sent to ...       [facility]  [phone]*       [address]       [place], [place]  [address]       Ph: [phone] or [phone]       Fax: [phone]   RxID:   [id number]

## 2010-06-18 NOTE — Assessment & Plan Note (Signed)
Summary: FU Bonney Leitz  $50   Vital Signs:  Patient Profile:   70 Years Old Male Weight:      224.6 pounds O2 Sat:      96 % O2 treatment:    Room Air Temp:     97.0 degrees F oral Pulse rate:   70 / minute BP sitting:   140 / 82  (left arm) Cuff size:   regular  Pt. in pain?   no  Vitals Entered By: Tomma Lightning (June 07, 2008 9:05 AM)                  PCP:  Dr. Sherren Mocha  Chief Complaint:  FOLLOW-UP.  History of Present Illness: pt has increased his novolin 70/30 to 95 units am and 40 units pm.  cbg's have improved to mostly mid-100's, with no trend throughout the day.  he feels well in general     Prior Medications Reviewed Using: Patient Recall  Prior Medication List:  RAMIPRIL 10 MG  CAPS (RAMIPRIL) take 1 by mouth two times a day qd AMLODIPINE BESYLATE 10 MG  TABS (AMLODIPINE BESYLATE) take 1 by mouth qd * ONE TOUCH ULTRA TEST STRIPS uad ADULT ASPIRIN LOW STRENGTH 81 MG  TBDP (ASPIRIN)  ZOCOR 40 MG  TABS (SIMVASTATIN) q.h.s. METOPROLOL TARTRATE 25 MG  TABS (METOPROLOL TARTRATE) take 1 by mouth two times a day qd * 100 UNIT INSULIN SYRINGES once a day NOVOLIN 70/30 70-30 % SUSP (INSULIN ISOPHANE & REGULAR) 80 units am and 40 units pm   Updated Prior Medication List: RAMIPRIL 10 MG  CAPS (RAMIPRIL) take 1 by mouth two times a day qd AMLODIPINE BESYLATE 10 MG  TABS (AMLODIPINE BESYLATE) take 1 by mouth qd * ONE TOUCH ULTRA TEST STRIPS uad ADULT ASPIRIN LOW STRENGTH 81 MG  TBDP (ASPIRIN)  ZOCOR 40 MG  TABS (SIMVASTATIN) q.h.s. METOPROLOL TARTRATE 25 MG  TABS (METOPROLOL TARTRATE) take 1 by mouth two times a day qd * 100 UNIT INSULIN SYRINGES once a day NOVOLIN 70/30 70-30 % SUSP (INSULIN ISOPHANE & REGULAR) 80 units am and 40 units pm  Current Allergies: No known allergies   Past Medical History:    Reviewed history from 01/12/2008 and no changes required:       PN       PROTEINURIA       CAD       FOOT PAIN, RIGHT (ICD-729.5)       VIRAL URI (ICD-465.9)    HYPERLIPIDEMIA (ICD-272.4)       HYPERTENSION (ICD-401.9)       DIABETES MELLITUS, TYPE II (ICD-250.00)     Review of Systems  The patient denies hypoglycemia.     Physical Exam  General:     obese.   Psych:     alert and cooperative; normal mood and affect; normal attention span and concentration    Impression & Recommendations:  Problem # 1:  DIABETES MELLITUS, TYPE II (ICD-250.00) therapy limited by pt's request for least expensive meds   Medications Added to Medication List This Visit: 1)  Novolin 70/30 70-30 % Susp (Insulin isophane & regular) .... 95 units am and 40 units pm   Patient Instructions: 1)  continue novolin 70/30 95 units am and 40 units with supper 2)  ret 30d 3)  check your blood glucose 1-2 times a day.  vary the time of day between before the 3 meals and at bedtime.  also check if you feel as though your glucose  might be very high or too low.  bring a record of this to your doctor appointments   Prescriptions: NOVOLIN 70/30 70-30 % SUSP (INSULIN ISOPHANE & REGULAR) 95 units am and 40 units pm  #5 x 11   Entered and Authorized by:   Donavan Foil MD   Signed by:   Donavan Foil MD on 06/07/2008   Method used:   Electronically to        [facility]  [phone]* (retail)       [address]       [place], [place]  [address]       Ph: [phone] or [phone]       Fax: [phone]   RxID:   [id number]

## 2010-06-18 NOTE — Miscellaneous (Signed)
Summary: chronic conditionform/[facility]  chronic conditionform/[facility]   Imported By: Bubba Hales 01/15/2008 11:23:30  _____________________________________________________________________  External Attachment:    Type:   Image     Comment:   External Document

## 2010-06-18 NOTE — Letter (Signed)
Summary: Recall Colonoscopy Letter  [facility]  [place], [place] [address]   Phone: [phone]  Fax: [phone]      January 27, 2009 MRN: [id number]   [place], [place]  [id number]   Dear [name],   According to your medical record, it is time for you to schedule a Colonoscopy. The American Cancer Society recommends this procedure as a method to detect early colon cancer. Patients with a family history of colon cancer, or a personal history of colon polyps or inflammatory bowel disease are at increased risk.  This letter has beeen generated based on the recommendations made at the time of your procedure. If you feel that in your particular situation this may no longer apply, please contact our office.  Please call our office at [phone] to schedule this appointment or to update your records at your earliest convenience.  Thank you for cooperating with [place] to provide you with the very best care possible.   Sincerely,   Dora L. Olevia Perches, M.D.  [facility] [phone]

## 2010-06-18 NOTE — Assessment & Plan Note (Signed)
Summary: 3 mth fu  $50   stc   Vital Signs:  Patient profile:   70 year old male Height:      67.5 inches (171.45 cm) Weight:      233.13 pounds (105.97 kg) O2 Sat:      96 % on Room air Temp:     96.3 degrees F (35.72 degrees C) oral Pulse rate:   73 / minute BP sitting:   138 / 74  (left arm) Cuff size:   large  Vitals Entered By: Gardenia Phlegm CMA (February 14, 2009 7:54 AM)  O2 Flow:  Room air CC: follow-up visit/CF Is Patient Diabetic? Yes   Primary Provider:  Dr. Sherren Mocha  CC:  follow-up visit/CF.  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   he brings a record of his cbg's which i have reviewed today.   he takes novolin 70/30, 100 units two times a day.  he has frequent mild hypoglycemia when he is active during the day.  it is otherwise in the low to mid-100's.  Current Medications (verified): 1)  Amlodipine Besylate 10 Mg  Tabs (Amlodipine Besylate) .... Take 1 By Mouth Qd 2)  One Touch Ultra Test Strips .... Uad 3)  Adult Aspirin Low Strength 81 Mg  Tbdp (Aspirin) 4)  Zocor 40 Mg  Tabs (Simvastatin) .... Q.h.s. 5)  100 Unit Insulin Syringes .... Once A Day 6)  Novolin 70/30 70-30 % Susp (Insulin Isophane & Regular) .Marland Kitchen.. 100 Units Am and 80 Units Pm 7)  Lisinopril 20 Mg Tabs (Lisinopril) .... Take 1 Tablet By Mouth Every Morning 8)  Metoprolol Tartrate 50 Mg Tabs (Metoprolol Tartrate) .... Take 1 Tablet By Mouth Every Morning  Allergies (verified): No Known Drug Allergies  Past History:  Past Medical History: Last updated: 01/12/2008 PN PROTEINURIA CAD FOOT PAIN, RIGHT (ICD-729.5) VIRAL URI (ICD-465.9) HYPERLIPIDEMIA (ICD-272.4) HYPERTENSION (ICD-401.9) DIABETES MELLITUS, TYPE II (ICD-250.00)  Review of Systems  The patient denies syncope.    Physical Exam  General:  obese.  no distress  Pulses:  dorsalis pedis intact bilat.   Extremities:  no deformity.  no ulcer on the feet.  feet are of  normal color and temp.   trace right pedal edema and trace left pedal edema.   Neurologic:  sensation is intact to touch on the feet  Additional Exam:  Hemoglobin A1C       [H]  8.9 %   Impression & Recommendations:  Problem # 1:  DIABETES MELLITUS, TYPE II (ICD-250.00) needs increased rx  Medications Added to Medication List This Visit: 1)  Novolin 70/30 70-30 % Susp (Insulin isophane & regular) .Marland Kitchen.. 100 units am and 90 units pm  Other Orders: TLB-A1C / Hgb A1C (Glycohemoglobin) (83036-A1C) Est. Patient Level III [id number])  Patient Instructions: 1)  take novolin 70/30, 100 units each am (80 if you are going to be active), and 90 units with the evening meal. 2)  tests are being ordered for you today.  a few days after the test(s), please call [phone] to hear your test results.  t 3)  check your blood sugar 2 times a day.  vary the time of day when you check, between before the 3 meals, and at bedtime.  also check if you have symptoms of your blood sugar being too high or too low.  please keep a record of the readings and bring it to your next appointment here.  please call  us sooner if you are having low blood sugar episodes. 4)  Please schedule a follow-up appointment in 3 months.

## 2010-06-18 NOTE — Progress Notes (Signed)
Summary: Diabetic supply forms  Phone Note Other Incoming   Caller: [facility] Summary of Call: rx received from [facility] for pt diabetic supply. form signed by MD, faxed and sent to scan Initial call taken by: Crissie Sickles, [place],  March 13, 2009 2:47 PM

## 2010-06-18 NOTE — Progress Notes (Signed)
Summary: CALL FROM PHARMACY (possible drug interaction)  Phone Note From Pharmacy   Caller: [facility] [phone]  Wille Glaser) Request: Speak with Nurse, Speak with Provider Summary of Call: Joe with [facility] called to advise a possible drug interaction between Amlodipine  and  Zocor..... Pharmacy called to inform MD of Drug interaction between Zocor 40mg  and Amlodipine 10mg .Pharmacy is requesting adj to Zocor dosage, please advise..... Dr Louretta Parma this should be directed to PCP for pt..... Call back for [facility]  [phone]  or  [phone] -----    Reference Order # [id number].  Initial call taken by: Duanne Moron,  March 10, 2010 12:24 PM

## 2010-06-18 NOTE — Progress Notes (Signed)
Summary: Drug Interaction  Phone Note From Pharmacy   Caller: [facility] [phone] ref [id number] Summary of Call: Pharmacy called to inform MD of drug interaction with Zocor 40 and Amplodipine 10. Pharmacy is suggesting change to Zocor 20mg . Please advise, SAE is out of office until 02/25/2010. Initial call taken by: Crissie Sickles, [place],  February 23, 2010 11:28 AM  Follow-up for Phone Call        Phone call completed Follow-up by: Dorena Cookey MD,  February 23, 2010 12:45 PM

## 2010-06-18 NOTE — Medication Information (Signed)
Summary: Diabetic Supplies / [facility]  Diabetic Supplies / [facility]   Imported By: Rise Patience 02/19/2010 11:49:49  _____________________________________________________________________  External Attachment:    Type:   Image     Comment:   External Document

## 2010-06-18 NOTE — Assessment & Plan Note (Signed)
Summary: 2 MO ROV/$50 /NWS   Vital Signs:  Patient profile:   70 year old male Height:      69 inches Weight:      232 pounds BMI:     34.38 Temp:     96.9 degrees F oral Pulse rate:   86 / minute BP sitting:   148 / 72  (left arm) Cuff size:   large  Vitals Entered By: Ami Bullins CMA (November 08, 2008 8:04 AM) CC: follow-up visit/ ab   Primary Provider:  Dr. Sherren Mocha  CC:  follow-up visit/ ab.  History of Present Illness: no cbg record, but states cbg's are as low as 70 at lunch on days when he is active.  he increased pm insulin to 75 units.  he feels well in general.   Current Medications (verified): 1)  Ramipril 10 Mg  Caps (Ramipril) .... Take 1 By Mouth Two Times A Day Qd 2)  Amlodipine Besylate 10 Mg  Tabs (Amlodipine Besylate) .... Take 1 By Mouth Qd 3)  One Touch Ultra Test Strips .... Uad 4)  Adult Aspirin Low Strength 81 Mg  Tbdp (Aspirin) 5)  Zocor 40 Mg  Tabs (Simvastatin) .... Q.h.s. 6)  Metoprolol Tartrate 25 Mg  Tabs (Metoprolol Tartrate) .... Take 1 By Mouth Two Times A Day Qd 7)  100 Unit Insulin Syringes .... Once A Day 8)  Novolin 70/30 70-30 % Susp (Insulin Isophane & Regular) .Marland Kitchen.. 100 Units Am and 65 Units Pm  Allergies (verified): No Known Drug Allergies  Past History:  Past Medical History: Last updated: 01/12/2008 PN PROTEINURIA CAD FOOT PAIN, RIGHT (ICD-729.5) VIRAL URI (ICD-465.9) HYPERLIPIDEMIA (ICD-272.4) HYPERTENSION (ICD-401.9) DIABETES MELLITUS, TYPE II (ICD-250.00)  Review of Systems  The patient denies syncope.    Physical Exam  General:  obese.   Neck:  Supple without thyroid enlargement or tenderness. No cervical lymphadenopathy, neck masses or tracheal deviation.    Impression & Recommendations:  Problem # 1:  DIABETES MELLITUS, TYPE II (ICD-250.00) therapy limited by his desire for the cheapest possible insulin, 3rd shift work, lack of cbg info, and his request for only two times a day insulin.  Medications Added to  Medication List This Visit: 1)  Novolin 70/30 70-30 % Susp (Insulin isophane & regular) .Marland Kitchen.. 100 units am and 75 units pm  Other Orders: TLB-A1C / Hgb A1C (Glycohemoglobin) (83036-A1C) TLB-BMP (Basic Metabolic Panel-BMET) (99991111) TLB-Lipid Panel (80061-LIPID) TLB-Hepatic/Liver Function Pnl (80076-HEPATIC) Est. Patient Level III [id number])  Patient Instructions: 1)  take novolin 70/30, 100 units each am (80 if you are going to be active), and 75 units with the evening meal. 2)  tests are being ordered for you today.  a few days after the test(s), please call [phone] to hear your test results.  this is very important to do because the results may change the instructions you see here 3)  (update: i left message on phone-tree:  it is very important to bring glucose record with you to dr appts) 4)  check your blood sugar 2 times a day.  vary the time of day when you check, between before the 3 meals, and at bedtime.  also check if you have symptoms of your blood sugar being too high or too low.  please keep a record of the readings and bring it to your next appointment here.  please call us sooner if you are having low blood sugar episodes. 5)  Please schedule a follow-up appointment in 3 months. 6)  i advised physical with dr Sherren Mocha

## 2010-06-18 NOTE — Assessment & Plan Note (Signed)
Summary: 3 MTH FU   STC   Vital Signs:  Patient profile:   70 year old male Height:      67.5 inches (171.45 cm) Weight:      234.50 pounds (106.59 kg) BMI:     36.32 O2 Sat:      96 % on Room air Temp:     96.8 degrees F (36 degrees C) oral Pulse rate:   84 / minute BP sitting:   124 / 64  (left arm) Cuff size:   large  Vitals Entered By: Gardenia Phlegm RMA (Sep 26, 2009 8:55 AM)  O2 Flow:  Room air CC: 3 month follow up/ CF Is Patient Diabetic? Yes   Primary Provider:  Dr. Sherren Mocha  CC:  3 month follow up/ CF.  History of Present Illness: pt says the cost of a medication is very important to him.  pt states he feels well in general.  no cbg record, but states cbg's are sometimes low at any time of day, due to variations of his diet or activity.  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    Current Medications (verified): 1)  Amlodipine Besylate 10 Mg  Tabs (Amlodipine Besylate) .... Take 1 By Mouth Qd 2)  One Touch Ultra Test Strips .... Uad 3)  Adult Aspirin Low Strength 81 Mg  Tbdp (Aspirin) 4)  Zocor 40 Mg  Tabs (Simvastatin) .... Q.h.s. 5)  Novolin 70/30 70-30 % Susp (Insulin Isophane & Regular) .Marland Kitchen.. 110 Units Am and 90 Units Pm 6)  Lisinopril 20 Mg Tabs (Lisinopril) .... Take 1 Tablet By Mouth Every Morning 7)  Metoprolol Tartrate 50 Mg Tabs (Metoprolol Tartrate) .... Take 1 Tablet By Mouth Every Morning 8)  Bd Disp Needle 30g X 1" Misc (Needle (Disp)) .... Any Brand, Bid  Allergies (verified): No Known Drug Allergies  Past History:  Past Medical History: Last updated: 01/12/2008 PN PROTEINURIA CAD FOOT PAIN, RIGHT (ICD-729.5) VIRAL URI (ICD-465.9) HYPERLIPIDEMIA (ICD-272.4) HYPERTENSION (ICD-401.9) DIABETES MELLITUS, TYPE II (ICD-250.00)  Review of Systems  The patient denies syncope.    Physical Exam  General:  obese.   Pulses:  dorsalis pedis intact bilat.   Extremities:  no  deformity.  no ulcer on the feet.  feet are of normal color and temp.   trace right pedal edema and trace left pedal edema.   Neurologic:  sensation is intact to touch on the feet    Impression & Recommendations:  Problem # 1:  DIABETES MELLITUS, TYPE II (ICD-250.00) two times a day nph would be simpler  Medications Added to Medication List This Visit: 1)  Humulin N 100 Unit/ml Susp (Insulin isophane human) .Marland Kitchen.. 110 units two times a day  Other Orders: Est. Patient Level III [id number])  Patient Instructions: 1)  tests are being ordered for you today.  a few days after the test(s), please call [phone] to hear your test results.   2)  check your blood sugar 2 times a day.  vary the time of day when you check, between before the 3 meals, and at bedtime.  also check if you have symptoms of your blood sugar being too high or too low.  please keep a record of the readings and bring it to your next appointment here.  please call us sooner if you are having low blood sugar episodes. 3)  Please schedule a follow-up appointment in 1 month. 4)  change current insulin to nph, 110 units two times a day.  however, if you are going to work overnight or be very active, take only 70 units. Prescriptions: HUMULIN N 100 UNIT/ML SUSP (INSULIN ISOPHANE HUMAN) 110 units two times a day  #8 vials x 11   Entered and Authorized by:   Donavan Foil MD   Signed by:   Donavan Foil MD on 09/26/2009   Method used:   Electronically to        [facility]  [phone]* (retail)       [address]       [place], [place]  [address]       Ph: [phone] or [phone]       Fax: [phone]   RxID:   [id number]   Appended Document: Orders Update     Clinical Lists Changes  Orders: Added new Test order of TLB-A1C / Hgb A1C (Glycohemoglobin) (83036-A1C) - Signed

## 2010-06-18 NOTE — Medication Information (Signed)
Summary: Humalog pt assistance/[facility]  Humalog pt assistance/[facility]   Imported By: Bubba Hales 01/24/2008 08:20:23  _____________________________________________________________________  External Attachment:    Type:   Image     Comment:   External Document

## 2010-06-18 NOTE — Assessment & Plan Note (Signed)
Summary: 3 MO ROV/  N[facility]   Vital Signs:  Patient profile:   70 year old male Height:      68 inches (172.72 cm) Weight:      242 pounds (110.00 kg) BMI:     36.93 O2 Sat:      96 % on Room air Temp:     98.1 degrees F (36.72 degrees C) oral Pulse rate:   93 / minute BP sitting:   128 / 74  (left arm) Cuff size:   large  Vitals Entered By: Rebeca Alert CMA Deborra Medina) (March 27, 2010 8:37 AM)  O2 Flow:  Room air CC: 3 month F/U/aj Is Patient Diabetic? Yes   Primary Provider:  Dr. Sherren Mocha  CC:  3 month F/U/aj.  History of Present Illness: no cbg record, but he says when he does not work overnight, cbg's are much higher in the evening than in am (he says it varies from 70-300).    Current Medications (verified): 1)  Amlodipine Besylate 10 Mg  Tabs (Amlodipine Besylate) .... Take 1 By Mouth Qd 2)  One Touch Ultra Test Strips .... Uad 3)  Adult Aspirin Low Strength 81 Mg  Tbdp (Aspirin) 4)  Zocor 40 Mg  Tabs (Simvastatin) .... Q.h.s. 5)  Lisinopril 20 Mg Tabs (Lisinopril) .... Take 1 Tablet By Mouth Every Morning 6)  Metoprolol Tartrate 50 Mg Tabs (Metoprolol Tartrate) .... Take 1 Tablet By Mouth Every Morning 7)  Bd Disp Needle 30g X 1" Misc (Needle (Disp)) .... Any Brand, Bid 8)  Humulin N 100 Unit/ml Susp (Insulin Isophane Human) .Marland Kitchen.. 120 Units Two Times A Day  Allergies (verified): No Known Drug Allergies  Past History:  Past Medical History: Last updated: 01/12/2008 PN PROTEINURIA CAD FOOT PAIN, RIGHT (ICD-729.5) VIRAL URI (ICD-465.9) HYPERLIPIDEMIA (ICD-272.4) HYPERTENSION (ICD-401.9) DIABETES MELLITUS, TYPE II (ICD-250.00)  Review of Systems  The patient denies syncope.    Physical Exam  General:  obese.  no distress  Pulses:  dorsalis pedis intact bilat.   Extremities:  no deformity.  no ulcer on the feet.  feet are of normal color and temp.    1+ right pedal edema and 2+ left pedal edema.   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    Medications Added to Medication List This Visit: 1)  Humulin N 100 Unit/ml Susp (Insulin isophane human) .Marland KitchenMarland KitchenMarland Kitchen 130 units each am, and 110 units in the evening. 2)  Humulin N 100 Unit/ml Susp (Insulin isophane human) .Marland KitchenMarland KitchenMarland Kitchen 150 units each am, and 110 units in the evening.  Other Orders: TLB-A1C / Hgb A1C (Glycohemoglobin) (83036-A1C) Est. Patient Level III [id number])  Patient Instructions: 1)  Please schedule a follow-up appointment in 1 month. 2)  change nph insulin to 130 units am and 110 units in the evening.  however, if you are going to work overnight, be very active, or eating less than usual, take only 70 units. 3)  Please schedule a follow-up appointment in 3 months. 4)  it is very important that you check your  blood sugar, and bring the record to you dr appointments. 5)  it is also very important that you see dr todd for a regular physical. 6)  (  update: i left message on phone-tree:  increase insulin to 150 units qam.  otherwise as above). Prescriptions: ZOCOR 40 MG  TABS (SIMVASTATIN) q.h.s.  #90 x 0   Entered and Authorized by:   Donavan Foil MD   Signed by:   Donavan Foil MD on 03/27/2010   Method used:   Electronically to        [facility] (retail)       [address]       [place], [place]  [address]       Ph: [phone] or [phone]       Fax: [phone]   RxID:   [id number]    Orders Added: 1)  TLB-A1C / Hgb A1C (Glycohemoglobin) [83036-A1C] 2)  Est. Patient Level III [id number]   Immunization History:  Influenza Immunization History:    Influenza:  historical (02/14/2010)   Immunization History:  Influenza Immunization  History:    Influenza:  Historical (02/14/2010)

## 2010-06-18 NOTE — Assessment & Plan Note (Signed)
     Visit Type:  Follow-up Visit PCP:  Dr. Sherren Mocha  Chief Complaint:  diabetes.  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  His glucoses are highest in the morning.  He feels this may be due to eating a snack at night, but he says the snack is quite light.  he brings an extensive record of his glucoses, which i have reviewed today.    Past Medical History:    Reviewed history from 11/03/2006 and no changes required:       Diabetes mellitus, type II       Hypertension       Hyperlipidemia       PN       PROTEINURIA       CAD      Physical Exam  General:     healthy appearing.   Psych:     alert and cooperative; normal mood and affect; normal attention span and concentration    Impression & Recommendations:  Problem # 1:  DIABETES MELLITUS, TYPE II (ICD-250.00)  His updated medication list for this problem includes:    Metformin Hcl 1000 Mg Tabs (Metformin hcl) .Marland Kitchen... Take 1 by mouth two times a day qd HgbA1C: 8.8 (12/02/2006) Triglycerides: 211 (06/23/2006) Creatinine: 1.2 (06/23/2006) a1c=8.9 (03/03/07)    Patient Instructions: 1)  continue Lantus 50 units nightly 2)  continue Humalog 20 units with each meal. 3)  take 5 units of Humalog with your H. S. snack, if you choose to eat one.  I've told him that it's okay that he is back at bedtime, but there is no medical reason to do so. 4)  Please schedule a follow-up appointment in 3 months.    ]

## 2010-06-18 NOTE — Progress Notes (Signed)
Summary: Schedule Colonoscopy   Phone Note Outgoing Call Call back at Home Phone [phone]   Call placed by: Bernita Buffy CMA Deborra Medina),  Oct 07, 2009 12:35 PM Call placed to: Patient Summary of Call: patient due for colonoscopy he states that he does not wish to have his colonoscopy I explained the importance of having another colonoscopy considering he has a personal hx of colon polyps. He still declined to schedule.  Initial call taken by: Bernita Buffy CMA Deborra Medina),  Oct 07, 2009 12:36 PM

## 2010-06-18 NOTE — Assessment & Plan Note (Signed)
Summary: med check/mhf  Medications Added LANTUS 100 UNIT/ML  SOLN (INSULIN GLARGINE) 55 units in am HUMALOG 100 UNIT/ML  SOLN (INSULIN LISPRO (HUMAN)) 20 units three times a day ADULT ASPIRIN LOW STRENGTH 81 MG  TBDP (ASPIRIN)       Allergies Added: NKDA  Vital Signs:  Patient Profile:   70 Years Old Male Weight:      216 pounds (98.18 kg) Temp:     97.8 degrees F (36.56 degrees C) oral Pulse rate:   110 / minute BP sitting:   153 / 78  Vitals Entered By: Vilinda Blanks, RN (June 01, 2007 11:24 AM)                 PCP:  Dr. Sherren Mocha  Chief Complaint:  1)Cough, sore throat, runny eyes & nose, and sweating/fever-2wks          2) meds .  History of Present Illness: Eric Choi is a 70 year old, married male, who comes in today for evaluation of two problems.  He said, head congestion, runny nose, cough for about two and half weeks.  He feels some tightness and wheezing in his chest.  He had fever initially, but that is gone.  However, now he has chills daily.  He has underlying diabetes.  He says his blood sugar varies is still not under good control.  It's been seeing Dr. Loanne Drilling who is adjust his medicines however, because working nights, etc. it's hard to control his medications and his blood sugar.  He had the sudden onset of pain in his right ankle 3 weeks ago.  It's finally dissipated.  He is never any problem like this in the past.  There is no history of trauma.  Current Allergies (reviewed today): No known allergies   Past Medical History:    Reviewed history from 11/03/2006 and no changes required:       Diabetes mellitus, type II       Hypertension       Hyperlipidemia       PN       PROTEINURIA       CAD   Family History:    Reviewed history and no changes required:       father died at age 68, COPD, diabetes, and alcoholism       mother in good health              twin brothers in good healthone sister died of breast cancer  Social History:   Occupation:    Married    Former Smoker    Alcohol use-no    Drug use-no    Regular exercise-yes   Risk Factors:  Tobacco use:  quit Drug use:  no Alcohol use:  no Exercise:  yes   Review of Systems      See HPI   Physical Exam  General:     Well-developed,well-nourished,in no acute distress; alert,appropriate and cooperative throughout examination Head:     Normocephalic and atraumatic without obvious abnormalities. No apparent alopecia or balding. Eyes:     No corneal or conjunctival inflammation noted. EOMI. Perrla. Funduscopic exam benign, without hemorrhages, exudates or papilledema. Vision grossly normal. Ears:     External ear exam shows no significant lesions or deformities.  Otoscopic examination reveals clear canals, tympanic membranes are intact bilaterally without bulging, retraction, inflammation or discharge. Hearing is grossly normal bilaterally. Nose:     External nasal examination shows no deformity or inflammation. Nasal mucosa are  pink and moist without lesions or exudates. Mouth:     Oral mucosa and oropharynx without lesions or exudates.  Teeth in good repair. Neck:     No deformities, masses, or tenderness noted. Chest Wall:     No deformities, masses, tenderness or gynecomastia noted. Lungs:     Normal respiratory effort, chest expands symmetrically. Lungs are clear to auscultation, no crackles or wheezes. Msk:     No deformity or scoliosis noted of thoracic or lumbar spine.   Pulses:     R and L carotid,radial,femoral,dorsalis pedis and posterior tibial pulses are full and equal bilaterally Extremities:     No clubbing, cyanosis, edema, or deformity noted with normal full range of motion of all joints.      Impression & Recommendations:  Problem # 1:  VIRAL URI (ICD-465.9) Assessment: New  His updated medication list for this problem includes:    Adult Aspirin Low Strength 81 Mg Tbdp (Aspirin)   Problem # 2:  FOOT PAIN, RIGHT  (ICD-729.5) Assessment: New  Orders: Venipuncture [id number]) TLB-Uric Acid, Blood (84550-URIC)   Complete Medication List: 1)  Lipitor 20 Mg Tabs (Atorvastatin calcium) .... Take 1 by mouth qd 2)  Metoprolol Succinate 50 Mg Tb24 (Metoprolol succinate) .... Take 1 by mouth qd 3)  Ramipril 10 Mg Caps (Ramipril) .... Take 1 by mouth two times a day qd 4)  Metformin Hcl 1000 Mg Tabs (Metformin hcl) .... Take 1 by mouth two times a day ; cpx when due; ov for rf's 5)  Amlodipine Besylate 10 Mg Tabs (Amlodipine besylate) .... Take 1 by mouth qd 6)  One Touch Ultra Test Strips  .... Uad 7)  Lantus 100 Unit/ml Soln (Insulin glargine) .... 55 units in am 8)  Humalog 100 Unit/ml Soln (Insulin lispro (human)) .... 20 units three times a day 9)  Adult Aspirin Low Strength 81 Mg Tbdp (Aspirin)   Patient Instructions: 1)  drank 40 ounces of water a day, when a vaporizer or humidifier in your bedroom at night, take one or 2 teaspoons of the cough syrup at bedtime as needed, use two puffs of the Qvair twice a day, take amoxicillin, 500 mg two in the morning, one at bedtime until the bottle is empty. 2)  We will get blood work today to rule out gout.  Will call you in a week to 10 days we get the report    ]

## 2010-06-18 NOTE — Progress Notes (Signed)
Summary: rx refill req  Phone Note Refill Request Message from:  Fax from Pharmacy on February 20, 2010 9:48 AM  Refills Requested: Medication #1:  AMLODIPINE BESYLATE 10 MG  TABS take 1 by mouth qd   Dosage confirmed as above?Dosage Confirmed   Notes: 90 day supply [facility]  Method Requested: Fax to [facility] Initial call taken by: Rebeca Alert MA,  February 20, 2010 9:48 AM    Prescriptions: AMLODIPINE BESYLATE 10 MG  TABS (AMLODIPINE BESYLATE) take 1 by mouth qd  #90 x 0   Entered by:   Rebeca Alert MA   Authorized by:   Donavan Foil MD   Signed by:   Rebeca Alert MA on 02/20/2010   Method used:   Faxed to ...       [facility] (mail-order)             , [place]         Ph:        Fax: [phone]   RxID:   [id number]

## 2010-06-18 NOTE — Assessment & Plan Note (Signed)
Summary: 3 MTH FU-STC   Vital Signs:  Patient Profile:   71 Years Old Male Weight:      223.0 pounds Temp:     97.0 degrees F oral Pulse rate:   88 / minute BP sitting:   136 / 81  (right arm) Cuff size:   regular  Vitals Entered By: Tomma Lightning (Sep 22, 2007 8:07 AM)                 PCP:  Dr. Sherren Mocha  Chief Complaint:  3 month follow-up.  History of Present Illness: he says his glucose goes low between breakfast and lunch, if he does a lot of work then.  pt says his cbg sometimes goes over 200 if he hasn't eaten in a while. patient wants to change his Lipitor to a similar generic product also wants generic bp meds    Current Allergies: No known allergies   Past Medical History:    Reviewed history from 11/03/2006 and no changes required:       Diabetes mellitus, type II       Hypertension       Hyperlipidemia       PN       PROTEINURIA       CAD   Social History:    Occupation:    Married    Former Smoker    Alcohol use-no    Drug use-no    Regular exercise-yes    works 3rd shift    Review of Systems  The patient denies syncope and prolonged cough.     Physical Exam  General:     obese.   Skin:     insulin injection sites at anterior abdomen are normal  Additional Exam:     A1C%                 [H]  11.2 %     Impression & Recommendations:  Problem # 1:  DIABETES MELLITUS, TYPE II (ICD-250.00) he appears to need simpler regimen His updated medication list for this problem includes:    Lantus 100 Unit/ml Soln (Insulin glargine) .Marland KitchenMarland KitchenMarland KitchenMarland Kitchen 100 units qd    Humalog 100 Unit/ml Soln (Insulin lispro (human)) .Marland Kitchen... 20 units three times a day  Orders: TLB-A1C / Hgb A1C (Glycohemoglobin) (83036-A1C) Est. Patient Level IV [id number]   Problem # 2:  HYPERLIPIDEMIA (ICD-272.4)  The following medications were removed from the medication list:    Lipitor 20 Mg Tabs (Atorvastatin calcium) .Marland Kitchen... Take 1 by mouth qd  His updated medication list for this  problem includes:    Zocor 40 Mg Tabs (Simvastatin) ..... Q.h.s.   Problem # 3:  HYPERTENSION (ICD-401.9)  The following medications were removed from the medication list:    Metoprolol Succinate 50 Mg Tb24 (Metoprolol succinate) .Marland Kitchen... Take 1 by mouth qd  His updated medication list for this problem includes:    Ramipril 10 Mg Caps (Ramipril) .Marland Kitchen... Take 1 by mouth two times a day qd    Amlodipine Besylate 10 Mg Tabs (Amlodipine besylate) .Marland Kitchen... Take 1 by mouth qd    Metoprolol Tartrate 25 Mg Tabs (Metoprolol tartrate) .Marland Kitchen... Take 1 by mouth two times a day qd   Medications Added to Medication List This Visit: 1)  Lantus 100 Unit/ml Soln (Insulin glargine) .Marland Kitchen.. 100 units qd 2)  Zocor 40 Mg Tabs (Simvastatin) .... Q.h.s. 3)  Metoprolol Tartrate 25 Mg Tabs (Metoprolol tartrate) .... Take 1 by mouth two times a day qd  Patient Instructions: 1)  increase lantus to 100/d 2)  decrease humalog to 10 with each meal 3)  ret 6 weeks 4)  change Lipitor to Zocor 40 mg a day 5)  I told the patient his amlodipine and ramipril are both generic already   Prescriptions: METOPROLOL TARTRATE 25 MG  TABS (METOPROLOL TARTRATE) take 1 by mouth two times a day qd  #180 x 3   Entered by:   Tomma Lightning   Authorized by:   Donavan Foil MD   Signed by:   Tomma Lightning on 09/22/2007   Method used:   Electronically sent to ...       [facility]  [id number]       [place], [place]  [address]       Ph: [phone] or [phone]       Fax: [phone]   RxID:   [id number] METOPROLOL TARTRATE 25 MG  TABS (METOPROLOL TARTRATE) take 1 by mouth two times a day qd  #180 x 3   Entered by:   Tomma Lightning   Authorized by:   Donavan Foil MD   Signed by:   Tomma Lightning on 09/22/2007   Method used:   Print then Give to Patient   [id number] LANTUS 100 UNIT/ML  SOLN (INSULIN GLARGINE) 100 units qd  #11 x 3   Entered and Authorized by:   Donavan Foil MD   Signed  by:   Donavan Foil MD on 09/22/2007   Method used:   Print then Give to Patient   RxID:   [id number] METOPROLOL SUCCINATE 50 MG  TB24 (METOPROLOL SUCCINATE) take 1 by mouth qd  #90 x 3   Entered and Authorized by:   Donavan Foil MD   Signed by:   Donavan Foil MD on 09/22/2007   Method used:   Print then Give to Patient   RxID:   [id number] ZOCOR 40 MG  TABS (SIMVASTATIN) q.h.s.  #90 x 3   Entered and Authorized by:   Donavan Foil MD   Signed by:   Donavan Foil MD on 09/22/2007   Method used:   Print then Give to Patient   RxID:   [id number] AMLODIPINE BESYLATE 10 MG  TABS (AMLODIPINE BESYLATE) take 1 by mouth qd  #90 x 3   Entered and Authorized by:   Donavan Foil MD   Signed by:   Donavan Foil MD on 09/22/2007   Method used:   Print then Give to Patient   RxID:   [id number] RAMIPRIL 10 MG  CAPS (RAMIPRIL) take 1 by mouth two times a day qd  #180 x 3   Entered and Authorized by:   Donavan Foil MD   Signed by:   Donavan Foil MD on 09/22/2007   Method used:   Print then Give to Patient   [id number]  ]

## 2010-06-18 NOTE — Assessment & Plan Note (Signed)
Summary: EMP-WILL FAST//CCM   Vital Signs:  Patient profile:   70 year old male Height:      67.5 inches Weight:      228 pounds Temp:     97.5 degrees F oral BP sitting:   120 / 78  (left arm) Cuff size:   regular  Vitals Entered By: Westley Hummer CMA Deborra Medina) (December 27, 2008 9:13 AM)  Reason for Visit cpx  Primary Care Provider:  Dr. Sherren Mocha   History of Present Illness: Eric Choi is a 70 year old male type I diabetic has not been under good control.  Dr. Loanne Drilling and has been trying to help [place] manage his diabetes.  However, it's difficult.  His current insulin regime is 90 units of 7030 in the morning, 80 units in the evening.  Fasting blood sugars range from 70 to 160.  Hemoglobin A1c in June was 10.2.  he has had  bilateral cataract extractions by Dr. Katy Fitch, , but has not been back for follow-up for routine eye exams.  Emphasized.  Annual screening.  Eye exam for diabetic retinopathy  He has a little bit of tingling below the base of his right toe, but otherwise no neuropathy.  His blood pressure is treated with Prempro, 10 mg b.i.d., Norvasc, 10 mg daily, and metacarpal, 25 mg b.i.d.  We will simplify his regime to take all his medications at one time in the morning.  BP today 120/78.  Previously when he went to see Dr. Ebony Hail.  He had not taken his blood pressure medication prior to the visit and his blood pressure was elevated.  Nares taking it daily, and his BP is dropped back to normal.  He does not get routine dental care.  Get a colonoscopy and GI.  He was found to have a polyp tubular recall card, tetanus, 2007, pneumonia shot 2002.  Declines any flu shots.  Allergies (verified): No Known Drug Allergies  Past History:  Past medical, surgical, family and social histories (including risk factors) reviewed, and no changes noted (except as noted below).  Past Medical History: Reviewed history from 01/12/2008 and no changes required. PN PROTEINURIA CAD FOOT PAIN, RIGHT  (ICD-729.5) VIRAL URI (ICD-465.9) HYPERLIPIDEMIA (ICD-272.4) HYPERTENSION (ICD-401.9) DIABETES MELLITUS, TYPE II (ICD-250.00)  Past Surgical History: Reviewed history from 11/03/2006 and no changes required. Percutaneous transluminal coronary angioplasty Cataract extraction R 02'  L 05'  Family History: Reviewed history from 06/01/2007 and no changes required. father died at age 60, COPD, diabetes, and alcoholism mother in good health  twin brothers in good healthone sister died of breast cancer  Social History: Reviewed history from 09/22/2007 and no changes required. Occupation: Married Former Smoker Alcohol use-no Drug use-no Regular exercise-yes works 3rd shift  Review of Systems      See HPI  Physical Exam  General:  Well-developed,well-nourished,in no acute distress; alert,appropriate and cooperative throughout examination Head:  Normocephalic and atraumatic without obvious abnormalities. No apparent alopecia or balding. Eyes:  No corneal or conjunctival inflammation noted. EOMI. Perrla. Funduscopic exam benign, without hemorrhages, exudates or papilledema. Vision grossly normal. Ears:  External ear exam shows no significant lesions or deformities.  Otoscopic examination reveals clear canals, tympanic membranes are intact bilaterally without bulging, retraction, inflammation or discharge. Hearing is grossly normal bilaterally. Nose:  External nasal examination shows no deformity or inflammation. Nasal mucosa are pink and moist without lesions or exudates. Mouth:  Oral mucosa and oropharynx without lesions or exudates.  Teeth in good repair. Neck:  No deformities, masses, or  tenderness noted. Chest Wall:  No deformities, masses, tenderness or gynecomastia noted. Breasts:  No masses or gynecomastia noted Lungs:  Normal respiratory effort, chest expands symmetrically. Lungs are clear to auscultation, no crackles or wheezes. Heart:  Normal rate and regular rhythm. S1 and  S2 normal without gallop, murmur, click, rub or other extra sounds. Abdomen:  Bowel sounds positive,abdomen soft and non-tender without masses, organomegaly or hernias noted. Rectal:  No external abnormalities noted. Normal sphincter tone. No rectal masses or tenderness. Genitalia:  Testes bilaterally descended without nodularity, tenderness or masses. No scrotal masses or lesions. No penis lesions or urethral discharge. Prostate:  Prostate gland firm and smooth, no enlargement, nodularity, tenderness, mass, asymmetry or induration. Msk:  No deformity or scoliosis noted of thoracic or lumbar spine.   Pulses:  R and L carotid,radial,femoral,dorsalis pedis and posterior tibial pulses are full and equal bilaterally Extremities:  No clubbing, cyanosis, edema, or deformity noted with normal full range of motion of all joints.   Neurologic:  No cranial nerve deficits noted. Station and gait are normal. Plantar reflexes are down-going bilaterally. DTRs are symmetrical throughout. Sensory, motor and coordinative functions appear intact.  Diabetes Management Exam:    Foot Exam (with socks and/or shoes not present):       Sensory-Pinprick/Light touch:          Left medial foot (L-4): normal          Left dorsal foot (L-5): normal          Left lateral foot (S-1): normal       Sensory-Monofilament:          Left foot: normal       Inspection:          Left foot: normal       Nails:          Left foot: normal    Eye Exam:       Eye Exam done elsewhere          Date: 2005          Results: normal          Done by: groat   Impression & Recommendations:  Problem # 1:  HYPERTENSION (ICD-401.9) Assessment Improved  The following medications were removed from the medication list:    Ramipril 10 Mg Caps (Ramipril) .Marland Kitchen... Take 1 by mouth two times a day qd    Metoprolol Tartrate 25 Mg Tabs (Metoprolol tartrate) .Marland Kitchen... Take 1 by mouth two times a day qd His updated medication list for this problem  includes:    Amlodipine Besylate 10 Mg Tabs (Amlodipine besylate) .Marland Kitchen... Take 1 by mouth qd    Lisinopril 20 Mg Tabs (Lisinopril) .Marland Kitchen... Take 1 tablet by mouth every morning    Metoprolol Tartrate 50 Mg Tabs (Metoprolol tartrate) .Marland Kitchen... Take 1 tablet by mouth every morning  Orders: Prescription Created Electronically [id number]) Venipuncture [id number]) TLB-CBC Platelet - w/Differential (85025-CBCD) TLB-TSH (Thyroid Stimulating Hormone) (84443-TSH) TLB-PSA (Prostate Specific Antigen) (84153-PSA) TLB-Microalbumin/Creat Ratio, Urine (82043-MALB) EKG w/ Interpretation (93000)  Problem # 2:  DIABETES MELLITUS, TYPE II (ICD-250.00) Assessment: Unchanged  The following medications were removed from the medication list:    Ramipril 10 Mg Caps (Ramipril) .Marland Kitchen... Take 1 by mouth two times a day qd His updated medication list for this problem includes:    Adult Aspirin Low Strength 81 Mg Tbdp (Aspirin)    Novolin 70/30 70-30 % Susp (Insulin isophane & regular) .Marland KitchenMarland KitchenMarland KitchenMarland Kitchen 100 units am and 80 units  pm    Lisinopril 20 Mg Tabs (Lisinopril) .Marland Kitchen... Take 1 tablet by mouth every morning  Orders: Prescription Created Electronically [id number]) Venipuncture [id number]) TLB-CBC Platelet - w/Differential (85025-CBCD) TLB-TSH (Thyroid Stimulating Hormone) (84443-TSH) TLB-PSA (Prostate Specific Antigen) (84153-PSA) TLB-Microalbumin/Creat Ratio, Urine (82043-MALB)  Complete Medication List: 1)  Amlodipine Besylate 10 Mg Tabs (Amlodipine besylate) .... Take 1 by mouth qd 2)  One Touch Ultra Test Strips  .... Uad 3)  Adult Aspirin Low Strength 81 Mg Tbdp (Aspirin) 4)  Zocor 40 Mg Tabs (Simvastatin) .... Q.h.s. 5)  100 Unit Insulin Syringes  .... Once a day 6)  Novolin 70/30 70-30 % Susp (Insulin isophane & regular) .Marland Kitchen.. 100 units am and 80 units pm 7)  Lisinopril 20 Mg Tabs (Lisinopril) .... Take 1 tablet by mouth every morning 8)  Metoprolol Tartrate 50 Mg Tabs (Metoprolol tartrate) .... Take 1 tablet by mouth every  morning  Other Orders: Pneumococcal Vaccine [id number]) Admin 1st Vaccine [id number])  Patient Instructions: 1)  continue the morning dose at 90 units.  Increase the evening dose by 5 units every week until your fasting blood sugar drops to 100 see Dr. Loanne Drilling in 4 weeks for follow-up 2)  It is important that you exercise regularly at least 20 minutes 5 times a week. If you develop chest pain, have severe difficulty breathing, or feel very tired , stop exercising immediately and seek medical attention. 3)  Schedule a colonoscopy/sigmoidoscopy to help detect colon cancer. 4)  Take an Aspirin every day. 5)  Check your blood sugars regularly. If your readings are usually above : or below 70 you should contact our office. 6)  It is important that your Diabetic A1c level is checked every 3 months. 7)  See your eye doctor yearly to check for diabetic eye damage. 8)  Check your feet each night for sore areas, calluses or signs of infection. 9)  Check your Blood Pressure regularly. If it is above: you should make an appointment. Prescriptions: ZOCOR 40 MG  TABS (SIMVASTATIN) q.h.s.  #100 x 3   Entered and Authorized by:   Dorena Cookey MD   Signed by:   Dorena Cookey MD on 12/27/2008   Method used:   Electronically to        [facility]  [phone]* (retail)       [place], [place]  [address]       Ph: [phone] or [phone]       Fax: [phone]   RxID:   [id number] AMLODIPINE BESYLATE 10 MG  TABS (AMLODIPINE BESYLATE) take 1 by mouth qd  #100 x 3   Entered and Authorized by:   Dorena Cookey MD   Signed by:   Dorena Cookey MD on 12/27/2008   Method used:   Electronically to        [facility]  [phone]* (retail)       [place], [place]  [address]       Ph: [phone] or [phone]       Fax: [phone]   [id number] METOPROLOL TARTRATE 50 MG TABS (METOPROLOL TARTRATE) Take 1 tablet by mouth every morning  #100 x 3    Entered and Authorized by:   Dorena Cookey MD   Signed by:   Dorena Cookey MD on 12/27/2008   Method used:   Electronically to  [facility]  [phone]* (retail)       [place], [place]  [address]       Ph: [phone] or [phone]       Fax: [phone]   RxID:   [id number] LISINOPRIL 20 MG TABS (LISINOPRIL) Take 1 tablet by mouth every morning  #100 x 3   Entered and Authorized by:   Dorena Cookey MD   Signed by:   Dorena Cookey MD on 12/27/2008   Method used:   Electronically to        [facility]  [phone]* (retail)       [address] [place], [place]  [address]       Ph: [phone] or [phone]       Fax: [phone]   RxID:   [id number]    Immunizations Administered:  Pneumonia Vaccine:    Vaccine Type: Pneumovax    Site: left deltoid    Mfr: Merck    Dose: 0.5 ml    Route: IM    Given by: Westley Hummer CMA ([place])    Exp. Date: 02/21/2010    Lot #: [id number]    Physician counseled: yes   Laboratory Results   Urine Tests    Routine Urinalysis   Color: yellow Appearance: Clear Glucose: negative   (Normal Range: Negative) Bilirubin: negative   (Normal Range: Negative) Ketone: negative   (Normal Range: Negative) Spec. Gravity: >=1.030   (Normal Range: 1.003-1.035) Blood: negative   (Normal Range: Negative) pH: 5.0   (Normal Range: 5.0-8.0) Protein: 2+   (Normal Range: Negative) Urobilinogen: 0.2   (Normal Range: 0-1) Nitrite: negative   (Normal Range: Negative) Leukocyte Esterace: negative   (Normal Range: Negative)    Comments: Oneita Kras

## 2010-06-18 NOTE — Medication Information (Signed)
Summary: diabetes supplies/[facility]  diabetes supplies/[facility]   Imported By: Bubba Hales 03/29/2008 10:47:51  _____________________________________________________________________  External Attachment:    Type:   Image     Comment:   External Document

## 2010-06-18 NOTE — Medication Information (Signed)
Summary: Lantus insulin/pt assistance/[employer]  Lantus insulin/pt assistance/[employer]   Imported By: Bubba Hales 01/24/2008 08:22:13  _____________________________________________________________________  External Attachment:    Type:   Image     Comment:   External Document

## 2010-06-18 NOTE — Assessment & Plan Note (Signed)
Summary: 1 MTH FU--D/T---OOT/JULY--STC   Vital Signs:  Patient profile:   70 year old male Height:      68 inches (172.72 cm) Weight:      236.13 pounds (107.33 kg) BMI:     36.03 O2 Sat:      95 % on Room air Temp:     97.8 degrees F (36.56 degrees C) oral Pulse rate:   72 / minute BP sitting:   112 / 66  (left arm) Cuff size:   large  Vitals Entered By: Rebeca Alert MA (December 19, 2009 8:24 AM)  O2 Flow:  Room air CC: 1 mo F/U/aj Is Patient Diabetic? Yes   Primary Provider:  Dr. Sherren Mocha  CC:  1 mo F/U/aj.  History of Present Illness: he brings a record of his cbg's which i have reviewed today.  cbg is as low as 70's, when he works overnight, or with activity.  he has not been reducing insulin on those occasions.  pt states he feels well in general.  Current Medications (verified): 1)  Amlodipine Besylate 10 Mg  Tabs (Amlodipine Besylate) .... Take 1 By Mouth Qd 2)  One Touch Ultra Test Strips .... Uad 3)  Adult Aspirin Low Strength 81 Mg  Tbdp (Aspirin) 4)  Zocor 40 Mg  Tabs (Simvastatin) .... Q.h.s. 5)  Lisinopril 20 Mg Tabs (Lisinopril) .... Take 1 Tablet By Mouth Every Morning 6)  Metoprolol Tartrate 50 Mg Tabs (Metoprolol Tartrate) .... Take 1 Tablet By Mouth Every Morning 7)  Bd Disp Needle 30g X 1" Misc (Needle (Disp)) .... Any Brand, Bid 8)  Humulin N 100 Unit/ml Susp (Insulin Isophane Human) .Marland Kitchen.. 110 Units Two Times A Day  Allergies (verified): No Known Drug Allergies  Past History:  Past Medical History: Last updated: 01/12/2008 PN PROTEINURIA CAD FOOT PAIN, RIGHT (ICD-729.5) VIRAL URI (ICD-465.9) HYPERLIPIDEMIA (ICD-272.4) HYPERTENSION (ICD-401.9) DIABETES MELLITUS, TYPE II (ICD-250.00)  Review of Systems  The patient denies hypoglycemia.    Physical Exam  General:  normal appearance.   Skin:  insulin injection sites at anterior abdomen are normal  Additional Exam:  Hemoglobin A1C       [H]  10.3 %    Impression &  Recommendations:  Problem # 1:  DIABETES MELLITUS, TYPE II (ICD-250.00) needs increased rx  Medications Added to Medication List This Visit: 1)  Humulin N 100 Unit/ml Susp (Insulin isophane human) .Marland Kitchen.. 120 units two times a day  Other Orders: TLB-A1C / Hgb A1C (Glycohemoglobin) (83036-A1C) Est. Patient Level III [id number])  Patient Instructions: 1)  blood tests are being ordered for you today.  please call [phone] to hear your test results. 2)  pending the test results, please continue the same medications for now 3)  check your blood sugar 2 times a day.  vary the time of day when you check, between before the 3 meals, and at bedtime.  also check if you have symptoms of your blood sugar being too high or too low.  please keep a record of the readings and bring it to your next appointment here.  please call us sooner if you are having low blood sugar episodes.  it is very important to bring this list to appointments to best manage your diabetes. 4)  Please schedule a follow-up appointment in 1 month. 5)  continue nph insulin, 110 units two times a day.  however, if you are going to work overnight, be very active, or eating less than usual, take only 70 units.  6)  please see dr todd for your physical, as you are due for it. 7)  (update: i left message on phone-tree:  increase nph to 120 units two times a day, except only take 70 at times noted above).

## 2010-06-18 NOTE — Medication Information (Signed)
Summary: not eligiible for assistance/[facility]  not eligiible for assistance/[facility] [employer]   Imported By: Bubba Hales 02/16/2008 09:03:58  _____________________________________________________________________  External Attachment:    Type:   Image     Comment:   External Document

## 2010-06-18 NOTE — Letter (Signed)
Summary: Diabetic Eye Exam/[facility]  Diabetic Eye Exam/[facility]   Imported By: Phillis Knack 03/03/2009 08:37:12  _____________________________________________________________________  External Attachment:    Type:   Image     Comment:   External Document

## 2010-06-18 NOTE — Letter (Signed)
Summary: Generic Letter  [facility]  [address] [place], [place] [address]   Phone: [phone]  Fax: [phone]    08/31/2007  [address] [place], [place]  [address]  Dear Mr. Holston,  due to your diabetes, you should wear soft shoes only.   Sincerely,   Renato Shin MD [facility]

## 2010-06-18 NOTE — Assessment & Plan Note (Signed)
Summary: 1 MTH FU---STC   Vital Signs:  Patient profile:   70 year old male Height:      68 inches Weight:      237.25 pounds BMI:     36.20 O2 Sat:      95 % on Room air Temp:     97.8 degrees F oral Pulse rate:   84 / minute BP sitting:   138 / 70  (left arm) Cuff size:   large  Vitals Entered By: Crissie Sickles, CMA (October 31, 2009 9:01 AM)  O2 Flow:  Room air CC: 1 mth F/U - DM Is Patient Diabetic? Yes Did you bring your meter with you today? No Pain Assessment Patient in pain? no        Primary Provider:  Dr. Sherren Mocha  CC:  1 mth F/U - DM.  History of Present Illness: pt is taking nph insulin 110 units two times a day, and has not reduced it to 70 on any occasion.   no cbg record, but he continues to have intermittent cbg's in the 70's approx 1/week, with exertion.  if he is not active, cbg's are consistently over 200.  Current Medications (verified): 1)  Amlodipine Besylate 10 Mg  Tabs (Amlodipine Besylate) .... Take 1 By Mouth Qd 2)  One Touch Ultra Test Strips .... Uad 3)  Adult Aspirin Low Strength 81 Mg  Tbdp (Aspirin) 4)  Zocor 40 Mg  Tabs (Simvastatin) .... Q.h.s. 5)  Lisinopril 20 Mg Tabs (Lisinopril) .... Take 1 Tablet By Mouth Every Morning 6)  Metoprolol Tartrate 50 Mg Tabs (Metoprolol Tartrate) .... Take 1 Tablet By Mouth Every Morning 7)  Bd Disp Needle 30g X 1" Misc (Needle (Disp)) .... Any Brand, Bid 8)  Humulin N 100 Unit/ml Susp (Insulin Isophane Human) .Marland Kitchen.. 110 Units Two Times A Day  Allergies (verified): No Known Drug Allergies  Past History:  Past Medical History: Last updated: 01/12/2008 PN PROTEINURIA CAD FOOT PAIN, RIGHT (ICD-729.5) VIRAL URI (ICD-465.9) HYPERLIPIDEMIA (ICD-272.4) HYPERTENSION (ICD-401.9) DIABETES MELLITUS, TYPE II (ICD-250.00)  Review of Systems  The patient denies syncope.    Physical Exam  General:  normal appearance.   Psych:  Alert and cooperative; normal mood and affect; normal attention span  and concentration.     Impression & Recommendations:  Problem # 1:  DIABETES MELLITUS, TYPE II (ICD-250.00) therapy limited by noncompliance with cbg recording.  i'll do the best i can.  Other Orders: Est. Patient Level III [id number])  Patient Instructions: 1)  check your blood sugar 2 times a day.  vary the time of day when you check, between before the 3 meals, and at bedtime.  also check if you have symptoms of your blood sugar being too high or too low.  please keep a record of the readings and bring it to your next appointment here.  please call us sooner if you are having low blood sugar episodes.  it is very important to bring this list to appointments to best manage your diabetes. 2)  Please schedule a follow-up appointment in 1 month. 3)  continue nph insulin, 110 units two times a day.  however, if you are going to work overnight, be very active, or eating less than usual, take only 70 units. Prescriptions: HUMULIN N 100 UNIT/ML SUSP (INSULIN ISOPHANE HUMAN) 110 units two times a day  #8 vials x 11   Entered and Authorized by:   Donavan Foil MD   Signed by:   Jacelyn Pi  Loanne Drilling MD on 10/31/2009   Method used:   Print then Give to Patient   RxID:   [id number]

## 2010-06-18 NOTE — Assessment & Plan Note (Signed)
Summary: 4 MTH FU-STC    Vital Signs:  Patient Profile:   71 Years Old Male Weight:      219.8 pounds Temp:     96.9 degrees F oral Pulse rate:   85 / minute BP sitting:   146 / 81  (right arm) Cuff size:   large  Vitals Entered By: Tomma Lightning (July 07, 2007 8:05 AM)                 Visit Type:  Follow-up Visit PCP:  Dr. Sherren Mocha  Chief Complaint:  dm.  History of Present Illness: now works 3rd shift.  states cbg's higher in am(often over 200)  than at hs.  does not take humalog with hs snack at hs as advised.      Current Allergies: No known allergies   Past Medical History:    Reviewed history from 11/03/2006 and no changes required:       Diabetes mellitus, type II       Hypertension       Hyperlipidemia       PN       PROTEINURIA       CAD     Review of Systems       has occ mild hypoglycemia before lunch   Physical Exam  General:     obese.   Pulses:     dorsalis pedis intact bilat.  Extremities:     no deformity.  no ulcer on the feet.  feet are of normal color and temp.  no edema has bilat onychomycosis Neurologic:     sensation is intact to touch on the feet Additional Exam:     a1c=9.8    Impression & Recommendations:  Problem # 1:  DIABETES MELLITUS, TYPE II (ICD-250.00) therapy limited by irregular schedule The following medications were removed from the medication list:    Metformin Hcl 1000 Mg Tabs (Metformin hcl) .Marland Kitchen... Take 1 by mouth two times a day ; cpx when due; ov for rf's  His updated medication list for this problem includes:    Lantus 100 Unit/ml Soln (Insulin glargine) .Marland KitchenMarland KitchenMarland KitchenMarland Kitchen 50 units qd    Humalog 100 Unit/ml Soln (Insulin lispro (human)) .Marland Kitchen... 20 units three times a day  Orders: TLB-A1C / Hgb A1C (Glycohemoglobin) (83036-A1C) Est. Patient Level III [id number])    Patient Instructions: 1)  increase lantus to 70 units at night 2)  decrease humalog to 15 units with each meal 3)  ret 6 weeks 4)  d/c  metformin    ]

## 2010-06-18 NOTE — Progress Notes (Signed)
Summary: Rx request  Phone Note Call from Patient Call back at Home Phone [phone]   Caller: Patient Summary of Call: pt called requesting Rx for Insulin filled 06/15/2009 be sent to correct Pharmacy -- [facility]. Initial call taken by: Crissie Sickles, CMA,  June 18, 2009 10:39 AM    Prescriptions: NOVOLIN 70/30 70-30 % SUSP (INSULIN ISOPHANE & REGULAR) 100 units bid  #1 month x 3   Entered by:   Crissie Sickles, CMA   Authorized by:   Donavan Foil MD   Signed by:   Crissie Sickles, CMA on 06/18/2009   Method used:   Electronically to        [facility]  [phone]* (retail)       [address] [place], [place]  [address]       Ph: [phone] or [phone]       Fax: [phone]   [id number]

## 2010-06-18 NOTE — Progress Notes (Signed)
Summary: Drug interaction  Phone Note From Pharmacy   Caller: [facility] [phone] ref [id number] Summary of Call: Pharmacy called to inform MD of Drug interaction between Zocor 40mg  and Amlodipine 10mg .Pharmacy is requesting adj to Zocor dosage, please advise. Initial call taken by: Crissie Sickles, [place],  March 09, 2010 9:33 AM  Follow-up for Phone Call        please direct to pcp dr todd, as i only see pt for dm. Follow-up by: Donavan Foil MD,  March 09, 2010 9:36 AM

## 2010-06-18 NOTE — Medication Information (Signed)
Summary: [facility]or change/[facility]  [facility]   Imported By: Susy Manor 10/04/2007 08:29:07  _____________________________________________________________________  External Attachment:    Type:   Image     Comment:   External Document

## 2010-06-18 NOTE — Progress Notes (Signed)
  Medications Added LANTUS 100 UNIT/ML  SOLN (INSULIN GLARGINE) 50 units qd       Phone Note Call from Patient Call back at Home Phone [phone]   Caller: [phone] Call For: dr Loanne Drilling Summary of Call: per pt wife call pt want the lantus 100 units vavles instead of the pens would like to pick up an rx  Initial call taken by: Nonah Mattes,  June 21, 2007 1:35 PM  Follow-up for Phone Call        rx printed and given to triage Follow-up by: Donavan Foil MD,  June 21, 2007 4:06 PM  Additional Follow-up for Phone Call Additional follow up Details #1::        called pt spoke with pt wife informed her rx is ready for pickup stated she will be by thur to pick up.June 21, 2007 4:29 PM   Additional Follow-up by: Nonah Mattes,  June 21, 2007 4:29 PM    New/Updated Medications: LANTUS 100 UNIT/ML  SOLN (INSULIN GLARGINE) 50 units qd   Prescriptions: LANTUS 100 UNIT/ML  SOLN (INSULIN GLARGINE) 50 units qd  #6 x 3   Entered and Authorized by:   Donavan Foil MD   Signed by:   Donavan Foil MD on 06/21/2007   Method used:   Print then Give to Patient   RxID:   [id number]

## 2010-06-18 NOTE — Assessment & Plan Note (Signed)
Summary: f/u appt/sae/cd   Vital Signs:  Patient profile:   70 year old male Height:      67.5 inches (171.45 cm) Weight:      233.50 pounds (106.14 kg) O2 Sat:      95 % on Room air Temp:     96.6 degrees F (35.89 degrees C) oral Pulse rate:   68 / minute BP sitting:   152 / 78  (left arm) Cuff size:   large  Vitals Entered By: Gardenia Phlegm CMA (March 21, 2009 8:27 AM)  O2 Flow:  Room air CC: follow-up visit/ pt states he doesn't want flu vax/ CF Is Patient Diabetic? Yes   Primary Provider:  Dr. Sherren Mocha  CC:  follow-up visit/ pt states he doesn't want flu vax/ CF.  History of Present Illness: pt states he feels well in general.  he had hypoglycemia at noon yesterday (he took 1000 units in the am).  however, he says it is 200 in am (he says in general, it is higher in am than the afternoon).    Current Medications (verified): 1)  Amlodipine Besylate 10 Mg  Tabs (Amlodipine Besylate) .... Take 1 By Mouth Qd 2)  One Touch Ultra Test Strips .... Uad 3)  Adult Aspirin Low Strength 81 Mg  Tbdp (Aspirin) 4)  Zocor 40 Mg  Tabs (Simvastatin) .... Q.h.s. 5)  100 Unit Insulin Syringes .... Once A Day 6)  Novolin 70/30 70-30 % Susp (Insulin Isophane & Regular) .Marland Kitchen.. 100 Units Am and 90 Units Pm 7)  Lisinopril 20 Mg Tabs (Lisinopril) .... Take 1 Tablet By Mouth Every Morning 8)  Metoprolol Tartrate 50 Mg Tabs (Metoprolol Tartrate) .... Take 1 Tablet By Mouth Every Morning  Allergies (verified): No Known Drug Allergies  Past History:  Past Medical History: Last updated: 01/12/2008 PN PROTEINURIA CAD FOOT PAIN, RIGHT (ICD-729.5) VIRAL URI (ICD-465.9) HYPERLIPIDEMIA (ICD-272.4) HYPERTENSION (ICD-401.9) DIABETES MELLITUS, TYPE II (ICD-250.00)  Review of Systems  The patient denies syncope.    Physical Exam  General:  normal appearance.   Skin:  insulin injection sites at anterior abdomen are normal    Impression & Recommendations:  Problem # 1:  DIABETES MELLITUS,  TYPE II (ICD-250.00) therapy limited by noncompliance.  i'll do the best i can.  Medications Added to Medication List This Visit: 1)  Novolin 70/30 70-30 % Susp (Insulin isophane & regular) .Marland Kitchen.. 100 units bid  Other Orders: Est. Patient Level III [id number])  Patient Instructions: 1)  take novolin 70/30, 100 units each am (80 if you are going to be active), and 100 units with the evening meal. 2)  tests are being ordered for you today.  a few days after the test(s), please call [phone] to hear your test results.   3)  check your blood sugar 2 times a day.  vary the time of day when you check, between before the 3 meals, and at bedtime.  also check if you have symptoms of your blood sugar being too high or too low.  please keep a record of the readings and bring it to your next appointment here.  please call us sooner if you are having low blood sugar episodes. 4)  Please schedule a follow-up appointment in 3 months. Prescriptions: ZOCOR 40 MG  TABS (SIMVASTATIN) q.h.s.  #90 x 3   Entered and Authorized by:   Donavan Foil MD   Signed by:   Donavan Foil MD on 03/21/2009   Method used:  Print then Give to Patient   RxID:   [id number] AMLODIPINE BESYLATE 10 MG  TABS (AMLODIPINE BESYLATE) take 1 by mouth qd  #90 x 3   Entered and Authorized by:   Donavan Foil MD   Signed by:   Donavan Foil MD on 03/21/2009   Method used:   Print then Give to Patient   RxID:   [id number]

## 2010-06-25 ENCOUNTER — Other Ambulatory Visit: Payer: Self-pay | Admitting: Endocrinology

## 2010-06-25 ENCOUNTER — Encounter (INDEPENDENT_AMBULATORY_CARE_PROVIDER_SITE_OTHER): Payer: Self-pay | Admitting: *Deleted

## 2010-06-25 ENCOUNTER — Ambulatory Visit (INDEPENDENT_AMBULATORY_CARE_PROVIDER_SITE_OTHER): Payer: MEDICARE | Admitting: Endocrinology

## 2010-06-25 ENCOUNTER — Encounter: Payer: Self-pay | Admitting: Endocrinology

## 2010-06-25 ENCOUNTER — Other Ambulatory Visit: Payer: MEDICARE

## 2010-06-25 DIAGNOSIS — E119 Type 2 diabetes mellitus without complications: Secondary | ICD-10-CM

## 2010-06-25 LAB — HEMOGLOBIN A1C: Hgb A1c MFr Bld: 9 % — ABNORMAL HIGH (ref 4.6–6.5)

## 2010-07-02 NOTE — Assessment & Plan Note (Signed)
Summary: 39m f/u  STC   Vital Signs:  Patient profile:   70 year old male Height:      68 inches (172.72 cm) Weight:      242.25 pounds (110.11 kg) BMI:     36.97 O2 Sat:      95 % on Room air Temp:     98.1 degrees F (36.72 degrees C) oral Pulse rate:   95 / minute Pulse rhythm:   regular BP sitting:   142 / 72  (left arm) Cuff size:   large  Vitals Entered By: Rebeca Alert CMA (AAMA) (June 25, 2010 9:00 AM)  O2 Flow:  Room air CC: 3 month F/U/aj Is Patient Diabetic? Yes   Primary Provider:  Dr. Sherren Mocha  CC:  3 month F/U/aj.  History of Present Illness: pt states he feels well in general.  he brings a record of his cbg's which i have reviewed today.  it varies from 62-150, with most between 100 and 130.  he takes nph insulin 120 units two times a day.  he has lost a few lbs, due to his efforts.    Current Medications (verified): 1)  Amlodipine Besylate 10 Mg  Tabs (Amlodipine Besylate) .... Take 1 By Mouth Qd 2)  One Touch Ultra Test Strips .... Uad 3)  Adult Aspirin Low Strength 81 Mg  Tbdp (Aspirin) 4)  Zocor 40 Mg  Tabs (Simvastatin) .... Q.h.s. 5)  Lisinopril 20 Mg Tabs (Lisinopril) .... Take 1 Tablet By Mouth Every Morning 6)  Metoprolol Tartrate 50 Mg Tabs (Metoprolol Tartrate) .... Take 1 Tablet By Mouth Every Morning 7)  Bd Disp Needle 30g X 1" Misc (Needle (Disp)) .... Any Brand, Bid 8)  Humulin N 100 Unit/ml Susp (Insulin Isophane Human) .Marland KitchenMarland KitchenMarland Kitchen 150 Units Each Am, and 110 Units in The Evening.  Allergies (verified): No Known Drug Allergies  Past History:  Past Medical History: Last updated: 01/12/2008 PN PROTEINURIA CAD FOOT PAIN, RIGHT (ICD-729.5) VIRAL URI (ICD-465.9) HYPERLIPIDEMIA (ICD-272.4) HYPERTENSION (ICD-401.9) DIABETES MELLITUS, TYPE II (ICD-250.00)  Review of Systems  The patient denies syncope.    Physical Exam  General:  obese.  no distress  Skin:  injection sites at the anterior abdomen are without lesions.     Impression &  Recommendations:  Problem # 1:  DIABETES MELLITUS, TYPE II ([id number]) uncertain control  Other Orders: TLB-A1C / Hgb A1C (Glycohemoglobin) (83036-A1C) Est. Patient Level III [id number])  Patient Instructions: 1)  Please schedule a follow-up appointment in 3 months 2)  blood tests are being ordered for you today.  please call [phone] to hear your test results. 3)  pending the test results, please continue nph insulin 120 units two times a day.  however, if you are going to work overnight, be very active, or eating less than usual, take only 70 units. 4)  Please schedule a follow-up appointment in 3 months. 5)  please also make a phycical appointment with dr todd.   Prescriptions: METOPROLOL TARTRATE 50 MG TABS (METOPROLOL TARTRATE) Take 1 tablet by mouth every morning  #90 x 0   Entered and Authorized by:   Donavan Foil MD   Signed by:   Donavan Foil MD on 06/25/2010   Method used:   Electronically to        [facility] (retail)       [address]       [place], [place]  [id number]       Ph: [phone] or [phone]       Fax: [phone]   [id number] LISINOPRIL 20 MG TABS (LISINOPRIL) Take 1 tablet by mouth every morning  #90 Tablet x 0   Entered and Authorized by:   Donavan Foil MD   Signed by:   Donavan Foil MD on 06/25/2010   Method used:   Electronically to        [facility] (retail)       [address]       [place], [place]  [address]       Ph: [phone] or [phone]       Fax: [phone]   RxID:   [id number]    Orders Added: 1)  TLB-A1C / Hgb A1C (Glycohemoglobin) [83036-A1C] 2)  Est. Patient Level III [id number]

## 2010-07-10 ENCOUNTER — Ambulatory Visit: Payer: MEDICARE | Admitting: Family Medicine

## 2010-07-12 ENCOUNTER — Encounter: Payer: Self-pay | Admitting: Family Medicine

## 2010-07-13 ENCOUNTER — Ambulatory Visit (INDEPENDENT_AMBULATORY_CARE_PROVIDER_SITE_OTHER): Payer: MEDICARE | Admitting: Family Medicine

## 2010-07-13 ENCOUNTER — Encounter: Payer: Self-pay | Admitting: Family Medicine

## 2010-07-13 DIAGNOSIS — E119 Type 2 diabetes mellitus without complications: Secondary | ICD-10-CM

## 2010-07-13 DIAGNOSIS — E1065 Type 1 diabetes mellitus with hyperglycemia: Secondary | ICD-10-CM

## 2010-07-13 DIAGNOSIS — E1159 Type 2 diabetes mellitus with other circulatory complications: Secondary | ICD-10-CM | POA: Insufficient documentation

## 2010-07-13 DIAGNOSIS — E1165 Type 2 diabetes mellitus with hyperglycemia: Secondary | ICD-10-CM | POA: Insufficient documentation

## 2010-07-13 DIAGNOSIS — IMO0002 Reserved for concepts with insufficient information to code with codable children: Secondary | ICD-10-CM

## 2010-07-13 DIAGNOSIS — E109 Type 1 diabetes mellitus without complications: Secondary | ICD-10-CM

## 2010-07-13 MED ORDER — SIMVASTATIN 40 MG PO TABS: 40.0000 mg | ORAL_TABLET | Freq: Every day | ORAL | Status: DC

## 2010-07-13 NOTE — Progress Notes (Signed)
  Subjective:    Patient ID: LAEL GARHART, male    DOB: [birth year], 70 y.o.   MRN: [id number]  HPI Tylek is a 70 year old male, who comes in today for follow-up of diabetes, type I.  A couple weeks ago, Dr. Ebony Hail changed his medication.  He is currently taking Humulin and 100 120 units b.i.d. Blood sugars sometimes drops to 60.  He can feel it rarely eats something and will come up to [address].  A1c has dropped down to 9.0%.  He states when he follows his diet and exercises on a regular basis,,,,,,,,,,,, he now has a [place], and he is exercising 30 minutes daily,,,,,,,,,, his blood sugars are much better.  When he gets off his diet and eats something.  He shouldn't eat and his blood sugar shoots up.  BP 140/70 on Norvasc 10 daily, lisinopril, 20 daily, beta blocker 50 mg daily.  We discussed various strategies including reconsultation with the diabetic teaching nurse because his dietary indiscretions R. Big part of his hyperglycemia   Review of Systems    neg Objective:   Physical Exam Well-developed well-nourished, male in no acute distress       Assessment & Plan:  Diabetes type I,,,,,,,,,, improved about and not to ideal control.  Hemoglobin A1c of 9.  Plan continue exercise consult with the diabetic teaching nurse.  Follow-up in 3 months

## 2010-07-13 NOTE — Patient Instructions (Signed)
Continue your current doses of insulin.  Continue daily exercise program.  We will get you set up to see the diabetic teaching nurse to help improve your diet.  Follow-up A1c in 3 months

## 2010-09-14 ENCOUNTER — Telehealth: Payer: Self-pay | Admitting: *Deleted

## 2010-09-14 MED ORDER — LISINOPRIL 20 MG PO TABS: 20.0000 mg | ORAL_TABLET | Freq: Every day | ORAL | Status: DC

## 2010-09-14 NOTE — Telephone Encounter (Signed)
90 day rx refill of lisinopril 20mg  tab sent to [facility].

## 2010-09-25 ENCOUNTER — Encounter: Payer: Self-pay | Admitting: Endocrinology

## 2010-09-25 ENCOUNTER — Other Ambulatory Visit: Payer: Medicare Other

## 2010-09-25 ENCOUNTER — Other Ambulatory Visit (INDEPENDENT_AMBULATORY_CARE_PROVIDER_SITE_OTHER): Payer: Medicare Other

## 2010-09-25 ENCOUNTER — Ambulatory Visit (INDEPENDENT_AMBULATORY_CARE_PROVIDER_SITE_OTHER): Payer: Medicare Other | Admitting: Endocrinology

## 2010-09-25 VITALS — BP 118/70 | HR 79 | Temp 97.6°F | Ht 69.0 in | Wt 241.0 lb

## 2010-09-25 DIAGNOSIS — IMO0002 Reserved for concepts with insufficient information to code with codable children: Secondary | ICD-10-CM

## 2010-09-25 DIAGNOSIS — E1065 Type 1 diabetes mellitus with hyperglycemia: Secondary | ICD-10-CM

## 2010-09-25 LAB — BASIC METABOLIC PANEL
BUN: 18 mg/dL (ref 6–23)
CO2: 29 mEq/L (ref 19–32)
Calcium: 10.3 mg/dL (ref 8.4–10.5)
Chloride: 102 mEq/L (ref 96–112)
Creatinine, Ser: 1.4 mg/dL (ref 0.4–1.5)
GFR: 52.22 mL/min — ABNORMAL LOW (ref >60.00)
Glucose, Bld: 103 mg/dL — ABNORMAL HIGH (ref 70–99)
Potassium: 4.7 mEq/L (ref 3.5–5.1)
Sodium: 140 mEq/L (ref 135–145)

## 2010-09-25 LAB — HEMOGLOBIN A1C: Hgb A1c MFr Bld: 9 % — ABNORMAL HIGH (ref 4.6–6.5)

## 2010-09-25 NOTE — Progress Notes (Signed)
  Subjective:    Patient ID: Eric Choi, male    DOB: 01-Jul-1940, 70 y.o.   MRN: [id number]  HPI pt states he feels well in general.  he brings a record of his cbg's which i have reviewed today.  It varies from 68-157.  It is low when he is active, but is unable to anticipate the activity. Past Medical History  Diagnosis Date  . DIABETES MELLITUS, TYPE II 11/03/2006  . HYPERLIPIDEMIA 11/03/2006  . HYPERTENSION 11/03/2006  . CAD (coronary artery disease)     Past Surgical History  Procedure Date  . Angioplasty     percutaneous transluminal   . Cataract extraction     History   Social History  . Marital Status: Married    Spouse Name: N/A    Number of Children: N/A  . Years of Education: N/A   Occupational History  . Not on file.   Social History Main Topics  . Smoking status: Former Research scientist (life sciences)  . Smokeless tobacco: Not on file  . Alcohol Use: No  . Drug Use: No  . Sexually Active:    Other Topics Concern  . Not on file   Social History Narrative  . No narrative on file    Current Outpatient Prescriptions on File Prior to Visit  Medication Sig Dispense Refill  . amLODipine (NORVASC) 10 MG tablet Take 10 mg by mouth daily.        Marland Kitchen aspirin 81 MG tablet Take 81 mg by mouth daily.        Marland Kitchen glucose blood test strip 1 each by Other route as directed. Use as instructed       . insulin NPH (HUMULIN N) 100 UNIT/ML injection Inject 120 Units into the skin.       Marland Kitchen insulin NPH (HUMULIN N) 100 UNIT/ML injection Inject 120 Units into the skin 2 (two) times daily.       Marland Kitchen lisinopril (PRINIVIL,ZESTRIL) 20 MG tablet Take 1 tablet (20 mg total) by mouth daily.  90 tablet  0  . metoprolol (LOPRESSOR) 50 MG tablet Take 50 mg by mouth daily.       Marland Kitchen NEEDLE, DISP, 30 G (B-D DISP NEEDLE 30GX1") 30G X 1" MISC by Does not apply route as directed.        . simvastatin (ZOCOR) 40 MG tablet Take 1 tablet (40 mg total) by mouth at bedtime.  100 tablet  3    No Known Allergies  Family  History  Problem Relation Age of Onset  . COPD Father   . Alcohol abuse Father   . Diabetes Father   . Cancer Sister     breast    BP 118/70  Pulse 79  Temp(Src) 97.6 F (36.4 C) (Oral)  Ht 5\' 9"  (1.753 m)  Wt 241 lb (109.317 kg)  BMI 35.59 kg/m2  SpO2 95%    Review of Systems Denies loc    Objective:   Physical Exam Pulses: dorsalis pedis intact bilat.   Feet: no deformity.  no ulcer on the feet.  feet are of normal color and temp.  no edema Neuro: sensation is intact to touch on the feet    Lab Results  Component Value Date   HGBA1C 9.0* 09/25/2010     Assessment & Plan:  Dm.  therapy limited by pt's need for a simple, inexpensive insulin regimen, and by 3rd shift work.  We have to eliminate hypoglycemia before we can improve a1c.

## 2010-09-25 NOTE — Patient Instructions (Addendum)
Please schedule a follow-up appointment in 3 months blood tests are being ordered for you today.  please call [phone] to hear your test results. pending the test results, please continue nph insulin 120 units two times a day.  however, if you are going to work overnight, be very active, or eating less than usual, take only 70 units.  If you are unable to anticipate the activity, eat a light snack with it.   Please schedule a follow-up appointment in 3 months.   Please make an appointment with dr todd for a regular physical, as you are due. (update: i left message on phone-tree:  rx as we discussed)

## 2010-09-29 NOTE — Assessment & Plan Note (Signed)
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  He has done quite well since that time.  He was referred  back to [place] by Dr. Renato Shin for followup cardiac check since he had  not been seen since having a treadmill test after his heart attack.  He  has had no recent symptoms of shortness of breath, chest pain, or  palpitations.  I saw him with Eric Choi last month, and we arranged  for him to have a Myoview scan, which showed no evidence of ischemia, no  residual scar, and a normal ejection fraction.   PAST MEDICAL HISTORY:  Significant for:  1. Hypertension.  2. Diabetes.  3. Hyperlipidemia.  4. He also has a history of colon polyps.   CURRENT MEDICATIONS:  Includes metformin, Lantus insulin, Humalog  insulin, amlodipine/benazepril, Lipitor, Ramipril, metoprolol.   EXAMINATION:  Blood pressure is 152/80.  Pulse 85 and regular.  There was no venous distention.  The carotid pulses were full without  bruits.  There was a left carotid bruit.  CHEST:  Was clear.  CARDIAC:  Rhythm was regular.  I could hear no murmurs or gallops.  ABDOMEN:  Soft without organomegaly.  Peripheral pulses were full.  There was no peripheral edema.   IMPRESSION:  1. Coronary artery disease, status post anterior wall myocardial      infarction 1994 treated with direction atherectomy of the left      anterior descending.  2. Hypertension, under optimal control.  3. Hyperlipidemia.  4. Diabetes.  5. Left carotid bruit.   RECOMMENDATIONS:  I think Eric Choi is doing quite well.  His blood  pressure is not optimal, and we will increase his Altace from 5  b.i.d.  to 10 b.i.d.  His cardiac situation is stable, and the main cardiac  issues are now related to secondary prevention.  We will plan to  continue his  current medications, and he has followup with Dr. Loanne Drilling, who will  manage his secondary prevention.  We will plan to see him back on a  p.r.n. basis.     Bruce Alfonso Patten Olevia Perches, MD, [facility]  Electronically Signed    BRB/MedQ  DD: 10/13/2006  DT: 10/13/2006  Job #: [id number]

## 2010-10-02 NOTE — Consult Note (Signed)
[facility]                          ENDOCRINOLOGY CONSULTATION   Eric Choi, Eric Choi                       MRN:          [id number]  DATE:07/07/2006                            DOB:          [birth year]    REFERRING PHYSICIAN:  Dellis Filbert A. Sherren Mocha, MD   REASON FOR REFERRAL:  Diabetes.   HISTORY OF PRESENT ILLNESS:  A 69 year old man who reports a 13-year  history of type 2 diabetes complicated by CAD.  He has been on insulin  for the last 18 months and currently takes Lantus 78 units a day.  He  states his glucoses are about 200 in the morning.  It is higher later in  the day, symptomatically, he is several years of slight foot pain, which  is present at rest but worse with walking and no associated numbness.   PAST MEDICAL HISTORY:  1. Hypertension.  2. Dyslipidemia.   SOCIAL HISTORY:  He works as a Pharmacist, community.  He is married.  He will retire  in less than a year.   FAMILY HISTORY:  Negative for diabetes in his immediate family.   REVIEW OF SYSTEMS:  Denies chest pain.  Denies hypoglycemia.   PHYSICAL EXAMINATION:  VITAL SIGNS:  Blood pressure 155/81, heart rate  91, temperature 96.9, weight 218.  GENERAL:  No distress.  SKIN:  No rash.  Normal texture and temperature.  HEENT:  No proptosis.  No per periorbital swelling.  Pharynx is normal.  NECK:  Supple.  No goiter.  CHEST:  Clear to auscultation.  No respiratory distress.  CARDIOVASCULAR:  No JVD.  There is trace bilateral pretibial edema.  Regular rate and rhythm.  No murmur.  EXTREMITIES:  Pedal pulses are absent.  Carotid arteries:  No bruits.  Feet:  Normal color and temperature.  There is no ulcer present in the  feet.  NEUROLOGICAL:  Alert and oriented.  Sensation is intact to touch in the  feet.   LABORATORY DATA:  Laboratory studies forwarded by Dr. Sherren Mocha on 07-16-06, hemoglobin A1C 11.2.   IMPRESSION:  1. Uncertain type of diabetes.  I agree with Dr. Sherren Mocha that he needs     adjustments in his therapy.  2. History of coronary artery disease.  3. Rest pain of both feet, worse with exercise, suggestive of      claudication.   PLAN:  1. We discussed the risk of diarrhea and the importance of diet and      exercise therapy.  2. Make Lantus 80 units a day to make it an even number.  3. We discussed the importance of risk factor control.  4. Add Humalog 10 breakfast, 5 lunch, 10 supper, as the pattern of his      glucose indicates he needs mealtime insulin.  5. Return in a few weeks.  6. This patient should have arterial Dopplers of the lower extremities      as well as a cardiac nuclear study if he has not had one of these      recently.     Sean A. Loanne Drilling, MD  Electronically  Signed    SAE/MedQ  DD: 07/07/2006  DT: 07/08/2006  Job #: [id number]   cc:   Dellis Filbert A. Sherren Mocha, MD

## 2010-10-02 NOTE — Assessment & Plan Note (Signed)
[facility] OFFICE NOTE   Eric Choi, Eric Choi                       MRN:          [id number]  DATE:08/31/2006                            DOB:          [birth year]    REFERRING PHYSICIAN:  Dellis Filbert A. Sherren Mocha, MD   HISTORY OF PRESENT ILLNESS:  Eric Choi is a 70 year old male patient  with a history of coronary artery disease, status post anterior wall  myocardial infarction June 1994 treated with directional coronary  atherectomy of the proximal LAD.  At the time of his cardiac  catheterization, he was noted to have a nondominant RCA.  His left  circumflex showed no significant disease.  His ventriculography at that  time showed severe hypokinesis of the anterolateral wall and apex.  No  ejection fraction was given on the report I have in his chart.  Postinfarct, the patient did undergo routine treadmill test with Dr.  Olevia Perches.  The last time he saw Dr. Olevia Perches was in July of 1995.  At that  time, it was noted that both of his treadmill tests at 3 and 6 months  postinfarct were negative.  The patient is referred back to Dr. Olevia Perches  today for follow up of his coronary disease, as we have not seen him in  quite some time.   The patient denies any chest pain, shortness of breath, syncope or near  syncope.  He denies any orthopnea or paroxysmal nocturnal dyspnea.  He  is able to exert himself without any symptoms of shortness of breath or  chest pain.  He denies any palpitations.  He does note leg pain from  time to time with exertion.  He denies any nonhealing ulcers or rest  pain.  He denies any unilateral weakness, monolocular blindness, facial  drooping or difficulty with speech.   PAST MEDICAL HISTORY:  1. As noted above, significant for coronary artery disease, status      post anterior wall myocardial infarction treated with angioplasty      in 1994.  2. Diabetes mellitus.  3. Hypertension.  4.  Hyperlipidemia.  5. History of colon polyps.   CURRENT MEDICATIONS:  1. Metformin 1 gm b.i.d.  2. Amlodipine 10 mg daily.  3. Metoprolol 50 mg 1/2 tablet daily.  4. Aspirin 81 mg daily.  5. Lantus as directed.  6. Ramipril 5 mg twice daily.  7. Lipitor 20 mg daily.  8. Humalog as directed.   ALLERGIES:  NO KNOWN DRUG ALLERGIES.   SOCIAL HISTORY:  He is an ex-smoker.  He quit smoking in 1996.  He has  about a 25-pack-year history.  Denies any alcohol abuse.  He is a Dietitian.  He is married, and has 2 children.   FAMILY HISTORY:  Significant for coronary disease.  His father had a  myocardial infarction.  He has a brother who has peripheral arterial  disease, and another brother who has had a stroke.  His mother is alive  at age 30.  He thinks that she has heart problems, but this is not  clear.  REVIEW OF SYSTEMS:  Please see HPI.  Denies any fever, chills, cough.  No hematochezia, hematuria, dysuria.  Denies any hemoptysis.  Denies any  dysphagia or odynophagia.  The rest of the review of systems are  negative.   PHYSICAL EXAMINATION:  He is well nourished, well developed, in no acute  distress.  Blood pressure 165/81.  Pulse 95.  Weight 217 pounds.  HEENT:  Head normocephalic and atraumatic.  Eyes:  PERRLA.  Sclerae are  clear.  NECK:  Without JVD.  Lymphatics: Without lymphadenopathy.  Endocrine:  Without thyromegaly.  Carotids:  There is a faint left carotid bruit  noted.  CARDIAC:  Normal S1 and S2.  Regular rate and rhythm with a [id number]  systolic ejection murmur heard best at sternal border.  LUNGS:  Clear to auscultation bilaterally without wheezing, rhonchi or  rales.  ABDOMEN:  Soft and non-tender with normoactive bowel sounds.  No  organomegaly.  No bruits.  EXTREMITIES:  Without edema.  Calves are soft and non-tender.  SKIN:  Warm and dry.  VASCULAR EXAM:  Femoral artery pulses 2+ bilaterally without bruits.  Popliteal, dorsalis pedis, and posterior  tibialis pulses 2+ bilaterally.  NEUROLOGIC:  He is alert and oriented x3.  Cranial nerves 2 through 12  are grossly intact.  Electrocardiogram revealed sinus rhythm.  Heart rate of 97.  No acute  changes.   IMPRESSION:  1. Coronary artery disease, status post anterior wall myocardial      infarction in 1994, treated with PTCA.  2. Hypertension.  3. Hyperlipidemia.  4. Diabetes mellitus.  5. Systolic murmur.  6. Left carotid bruit.  7. Exertional leg pain.   PLAN:  The patient was also seen and examined by Dr. Olevia Perches.  At this  point in time, we recommend proceeding with stress Myoview testing to  screen him for progression of coronary disease.  We feel that this is  most important in light of his history of diabetes and prior history of  coronary artery disease.  He also has a carotid bruit.  We will set him  up for carotid Dopplers to rule out significant stenosis.  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  The  patient is going to lose his job shortly, and is somewhat reluctant to  do several tests all at once.  At this point in time, we will hold off  on an echocardiogram.  He does have some leg pain with exertion.  This  sounds like it could be consistent with claudication, although he has  good pulses on exam, and no femoral artery bruits.  We will hold off on  any further testing at this point in time.  The patient needs better  blood pressure control.  His metoprolol will be increased to 1 tablet a  day.  He will follow up with Dr. Olevia Perches in the next 4 to 6 weeks after  the above testing.      Richardson Dopp, PA-C  Electronically Signed      Vanna Scotland. Olevia Perches, MD, [facility]  Electronically Signed   SW/MedQ  DD: 08/31/2006  DT: 08/31/2006  Job #: [id number]   cc:   Dellis Filbert A. Sherren Mocha, MD  Jacelyn Pi. Loanne Drilling, MD

## 2010-10-06 ENCOUNTER — Other Ambulatory Visit (INDEPENDENT_AMBULATORY_CARE_PROVIDER_SITE_OTHER): Payer: Medicare Other

## 2010-10-06 DIAGNOSIS — Z125 Encounter for screening for malignant neoplasm of prostate: Secondary | ICD-10-CM

## 2010-10-06 DIAGNOSIS — IMO0002 Reserved for concepts with insufficient information to code with codable children: Secondary | ICD-10-CM

## 2010-10-06 DIAGNOSIS — I10 Essential (primary) hypertension: Secondary | ICD-10-CM

## 2010-10-06 DIAGNOSIS — E119 Type 2 diabetes mellitus without complications: Secondary | ICD-10-CM

## 2010-10-06 DIAGNOSIS — E1065 Type 1 diabetes mellitus with hyperglycemia: Secondary | ICD-10-CM

## 2010-10-06 DIAGNOSIS — Z0000 Encounter for general adult medical examination without abnormal findings: Secondary | ICD-10-CM

## 2010-10-06 LAB — LIPID PANEL
Cholesterol: 169 mg/dL (ref 0–200)
HDL: 41.4 mg/dL (ref >39.00)
Total CHOL/HDL Ratio: 4
Triglycerides: 337 mg/dL — ABNORMAL HIGH (ref 0.0–149.0)
VLDL: 67.4 mg/dL — ABNORMAL HIGH (ref 0.0–40.0)

## 2010-10-06 LAB — POCT URINALYSIS DIPSTICK
Bilirubin, UA: NEGATIVE
Blood, UA: NEGATIVE
Ketones, UA: NEGATIVE
Leukocytes, UA: NEGATIVE
Nitrite, UA: NEGATIVE
Spec Grav, UA: 1.025
Urobilinogen, UA: 0.2
pH, UA: 5

## 2010-10-06 LAB — TSH: TSH: 2.38 u[IU]/mL (ref 0.35–5.50)

## 2010-10-06 LAB — HEPATIC FUNCTION PANEL
ALT: 33 U/L (ref 0–53)
AST: 24 U/L (ref 0–37)
Albumin: 4 g/dL (ref 3.5–5.2)
Alkaline Phosphatase: 47 U/L (ref 39–117)
Bilirubin, Direct: 0.1 mg/dL (ref 0.0–0.3)
Total Bilirubin: 0.8 mg/dL (ref 0.3–1.2)
Total Protein: 6.8 g/dL (ref 6.0–8.3)

## 2010-10-06 LAB — BASIC METABOLIC PANEL
BUN: 19 mg/dL (ref 6–23)
CO2: 26 mEq/L (ref 19–32)
Calcium: 10 mg/dL (ref 8.4–10.5)
Chloride: 104 mEq/L (ref 96–112)
Creatinine, Ser: 1.4 mg/dL (ref 0.4–1.5)
GFR: 52.22 mL/min — ABNORMAL LOW (ref >60.00)
Glucose, Bld: 128 mg/dL — ABNORMAL HIGH (ref 70–99)
Potassium: 5.4 mEq/L — ABNORMAL HIGH (ref 3.5–5.1)
Sodium: 139 mEq/L (ref 135–145)

## 2010-10-06 LAB — CBC WITH DIFFERENTIAL/PLATELET
Basophils Absolute: 0 10*3/uL (ref 0.0–0.1)
Basophils Relative: 0.4 % (ref 0.0–3.0)
Eosinophils Absolute: 0.3 10*3/uL (ref 0.0–0.7)
Eosinophils Relative: 3 % (ref 0.0–5.0)
HCT: 45.3 % (ref 39.0–52.0)
Hemoglobin: 15.2 g/dL (ref 13.0–17.0)
Lymphocytes Relative: 27.4 % (ref 12.0–46.0)
Lymphs Abs: 3 10*3/uL (ref 0.7–4.0)
MCHC: 33.5 g/dL (ref 30.0–36.0)
MCV: 90.6 fl (ref 78.0–100.0)
Monocytes Absolute: 1 10*3/uL (ref 0.1–1.0)
Monocytes Relative: 9.3 % (ref 3.0–12.0)
Neutro Abs: 6.5 10*3/uL (ref 1.4–7.7)
Neutrophils Relative %: 59.9 % (ref 43.0–77.0)
Platelets: 240 10*3/uL (ref 150.0–400.0)
RBC: 5.01 Mil/uL (ref 4.22–5.81)
RDW: 14.7 % — ABNORMAL HIGH (ref 11.5–14.6)
WBC: 10.9 10*3/uL — ABNORMAL HIGH (ref 4.5–10.5)

## 2010-10-06 LAB — MICROALBUMIN / CREATININE URINE RATIO
Creatinine,U: 141.1 mg/dL
Microalb Creat Ratio: 5.2 mg/g (ref 0.0–30.0)
Microalb, Ur: 7.3 mg/dL — ABNORMAL HIGH (ref 0.0–1.9)

## 2010-10-06 LAB — HEMOGLOBIN A1C: Hgb A1c MFr Bld: 9.1 % — ABNORMAL HIGH (ref 4.6–6.5)

## 2010-10-06 LAB — PSA: PSA: 0.86 ng/mL (ref 0.10–4.00)

## 2010-10-06 LAB — LDL CHOLESTEROL, DIRECT: Direct LDL: 93.3 mg/dL

## 2010-10-15 ENCOUNTER — Ambulatory Visit (INDEPENDENT_AMBULATORY_CARE_PROVIDER_SITE_OTHER): Payer: Medicare Other | Admitting: Family Medicine

## 2010-10-15 ENCOUNTER — Encounter: Payer: Self-pay | Admitting: Family Medicine

## 2010-10-15 DIAGNOSIS — Z23 Encounter for immunization: Secondary | ICD-10-CM

## 2010-10-15 DIAGNOSIS — I10 Essential (primary) hypertension: Secondary | ICD-10-CM

## 2010-10-15 DIAGNOSIS — IMO0002 Reserved for concepts with insufficient information to code with codable children: Secondary | ICD-10-CM

## 2010-10-15 DIAGNOSIS — E1065 Type 1 diabetes mellitus with hyperglycemia: Secondary | ICD-10-CM

## 2010-10-15 MED ORDER — AMLODIPINE BESYLATE 10 MG PO TABS: 10.0000 mg | ORAL_TABLET | Freq: Every day | ORAL | Status: DC

## 2010-10-15 MED ORDER — METOPROLOL TARTRATE 50 MG PO TABS: 50.0000 mg | ORAL_TABLET | Freq: Every day | ORAL | Status: DC

## 2010-10-15 MED ORDER — LISINOPRIL 20 MG PO TABS: 20.0000 mg | ORAL_TABLET | Freq: Every day | ORAL | Status: DC

## 2010-10-15 MED ORDER — SIMVASTATIN 40 MG PO TABS: 40.0000 mg | ORAL_TABLET | Freq: Every day | ORAL | Status: DC

## 2010-10-15 NOTE — Progress Notes (Signed)
  Subjective:    Patient ID: Eric Choi, male    DOB: January 04, 1941, 70 y.o.   MRN: [id number]  HPIDonald is a delightful, 70 year old, divorced male, nonsmoker, who comes in today for evaluation of diabetes, hypertension, and hyperlipidemia.  He is a type I diabetic and is currently taking insulin 120 units b.i.d.  Dr. Loanne Drilling is also helping follow with his diabetes.  His sugars were dropping the 60 to 80 range however his A1c is 9.1.  He has no retinopathy, and no peripheral neuropathy.  Yearly eye exam June 2011 normal.  We tried many different things to get his blood sugar normal however, nothing seems to work.  He does take Norvasc 10 mg daily and lisinopril 20 daily and Lopressor 50 daily for hypertension.  BP at home 140/80.  He takes Zocor 40 mg nightly along with a baby aspirin.  Lipids are fairly normal.  He gets routine eye care, dental care, previous colonoscopy showed some polyps.  He was given a recall letter, but has not gone back yet.  Encouraged to go back for follow-up colonoscopy.    Review of Systems  Constitutional: Negative.   HENT: Negative.   Eyes: Negative.   Respiratory: Negative.   Cardiovascular: Negative.   Gastrointestinal: Negative.   Genitourinary: Negative.   Musculoskeletal: Negative.   Skin: Negative.   Neurological: Negative.   Hematological: Negative.   Psychiatric/Behavioral: Negative.        Objective:   Physical Exam  Constitutional: He is oriented to person, place, and time. He appears well-developed and well-nourished.  HENT:  Head: Normocephalic and atraumatic.  Right Ear: External ear normal.  Left Ear: External ear normal.  Nose: Nose normal.  Mouth/Throat: Oropharynx is clear and moist.  Eyes: Conjunctivae and EOM are normal. Pupils are equal, round, and reactive to light.  Neck: Normal range of motion. Neck supple. No JVD present. No tracheal deviation present. No thyromegaly present.  Cardiovascular: Normal rate, regular  rhythm, normal heart sounds and intact distal pulses.  Exam reveals no gallop and no friction rub.   No murmur heard. Pulmonary/Chest: Effort normal and breath sounds normal. No stridor. No respiratory distress. He has no wheezes. He has no rales. He exhibits no tenderness.  Abdominal: Soft. Bowel sounds are normal. He exhibits no distension and no mass. There is no tenderness. There is no rebound and no guarding.  Genitourinary: Rectum normal, prostate normal and penis normal. Guaiac negative stool. No penile tenderness.  Musculoskeletal: Normal range of motion. He exhibits no edema and no tenderness.  Lymphadenopathy:    He has no cervical adenopathy.  Neurological: He is alert and oriented to person, place, and time. He has normal reflexes. No cranial nerve deficit. He exhibits normal muscle tone.  Skin: Skin is warm and dry. No rash noted. No erythema. No pallor.  Psychiatric: He has a normal mood and affect. His behavior is normal. Judgment and thought content normal.          Assessment & Plan:  Diabetes type 1, fairly normal morning sugars, however, A1c still elevated, however, all the other things.  We tried have not worked there for I would defer back to Dr. Ebony Hail.  Hypertension.  Continue current medication.  Hyperlipidemia.  Continue current medication.  Follow-up with me in one year 3 months with Dr. Ebony Hail

## 2010-10-15 NOTE — Patient Instructions (Signed)
Continue your current medications.  Follow-up with Dr. Loanne Drilling every 3 months or per his instructions.  C [place]  yearly.  Next May 3,,,,,,,,,,,,,, annual exam.  Remember to walk the day.  She did not work.  I

## 2010-11-27 ENCOUNTER — Other Ambulatory Visit: Payer: Self-pay | Admitting: Endocrinology

## 2010-11-27 MED ORDER — GLUCOSE BLOOD VI STRP: ORAL_STRIP | Status: AC

## 2010-12-04 ENCOUNTER — Other Ambulatory Visit: Payer: Self-pay | Admitting: Endocrinology

## 2010-12-16 ENCOUNTER — Encounter: Payer: Self-pay | Admitting: *Deleted

## 2010-12-23 ENCOUNTER — Encounter: Payer: Self-pay | Admitting: Endocrinology

## 2010-12-23 ENCOUNTER — Other Ambulatory Visit (INDEPENDENT_AMBULATORY_CARE_PROVIDER_SITE_OTHER): Payer: Medicare Other

## 2010-12-23 ENCOUNTER — Ambulatory Visit (INDEPENDENT_AMBULATORY_CARE_PROVIDER_SITE_OTHER): Payer: Medicare Other | Admitting: Endocrinology

## 2010-12-23 VITALS — BP 142/70 | HR 87 | Temp 97.8°F | Ht 70.0 in | Wt 244.8 lb

## 2010-12-23 DIAGNOSIS — IMO0002 Reserved for concepts with insufficient information to code with codable children: Secondary | ICD-10-CM

## 2010-12-23 DIAGNOSIS — E1065 Type 1 diabetes mellitus with hyperglycemia: Secondary | ICD-10-CM

## 2010-12-23 LAB — HEMOGLOBIN A1C: Hgb A1c MFr Bld: 9.7 % — ABNORMAL HIGH (ref 4.6–6.5)

## 2010-12-23 NOTE — Progress Notes (Signed)
Subjective:    Patient ID: Eric Choi, male    DOB: 10/10/1940, 70 y.o.   MRN: [id number]  HPI pt states he feels well in general.  no cbg record, but states cbg's are 100-200.  He finds it difficult to see any pattern in his cbg's due to 3rd shift work. Past Medical History  Diagnosis Date  . DIABETES MELLITUS, TYPE II 11/03/2006  . HYPERLIPIDEMIA 11/03/2006  . HYPERTENSION 11/03/2006  . CAD (coronary artery disease)     Past Surgical History  Procedure Date  . Angioplasty     percutaneous transluminal   . Cataract extraction 2002, 2005    bilateral  . Ptca     History   Social History  . Marital Status: Married    Spouse Name: N/A    Number of Children: N/A  . Years of Education: N/A   Occupational History  . Not on file.   Social History Main Topics  . Smoking status: Former Research scientist (life sciences)  . Smokeless tobacco: Not on file  . Alcohol Use: No  . Drug Use: No  . Sexually Active:    Other Topics Concern  . Not on file   Social History Narrative   Works 3rd shiftRegular Exercise-yes    Current Outpatient Prescriptions on File Prior to Visit  Medication Sig Dispense Refill  . ALPHA LIPOIC ACID PO Take by mouth.        Marland Kitchen amLODipine (NORVASC) 10 MG tablet TAKE ONE TABLET BY MOUTH DAILY  90 tablet  1  . aspirin 81 MG tablet Take 81 mg by mouth daily.        . Calcium-Vitamin D 600-200 MG-UNIT per tablet Take 1 tablet by mouth daily.        . fish oil-omega-3 fatty acids 1000 MG capsule Take 2 g by mouth daily.        Marland Kitchen glucose blood (BAYER CONTOUR TEST) test strip Use as instructed  100 each  12  . lisinopril (PRINIVIL,ZESTRIL) 20 MG tablet Take 1 tablet (20 mg total) by mouth daily.  100 tablet  3  . metoprolol (LOPRESSOR) 50 MG tablet Take 1 tablet (50 mg total) by mouth daily.  100 tablet  3  . NEEDLE, DISP, 30 G (B-D DISP NEEDLE 30GX1") 30G X 1" MISC by Does not apply route as directed.        . simvastatin (ZOCOR) 40 MG tablet Take 1 tablet (40 mg total) by mouth  at bedtime.  100 tablet  3  . Specialty Vitamins Products (MAGNESIUM, AMINO ACID CHELATE,) 133 MG tablet Take 1 tablet by mouth daily.          No Known Allergies  Family History  Problem Relation Age of Onset  . COPD Father   . Alcohol abuse Father   . Diabetes Father   . Cancer Sister     breast    BP 142/70  Pulse 87  Temp(Src) 97.8 F (36.6 C) (Oral)  Ht 5\' 10"  (1.778 m)  Wt 244 lb 12.8 oz (111.041 kg)  BMI 35.13 kg/m2  SpO2 95%    Review of Systems denies hypoglycemia    Objective:   Physical Exam GENERAL: no distress. SKIN:  Insulin injection sites at the anterior abdomen are normal  Lab Results  Component Value Date   HGBA1C 9.7* 12/23/2010      Assessment & Plan:  Dm.  therapy is limited by lack of cbg record, need for a simple schedule, and 3rd shift work.

## 2010-12-23 NOTE — Patient Instructions (Addendum)
Please schedule a follow-up appointment in 3 months blood tests are being ordered for you today.  please call [phone] to hear your test results.  You will be prompted to enter the 9-digit "MRN" number that appears at the top left of this page, followed by #.  Then you will hear the message. pending the test results, please increase nph insulin to 130 units two times a day.  however, if you are going to work overnight, be very active, or eating less than usual, take only 70 units.  If you are unable to anticipate the activity, eat a light snack with it.   Please schedule a follow-up appointment in 3 months.   check your blood sugar 2 times a day.  vary the time of day when you check, between before the 3 meals, and at bedtime.  also check if you have symptoms of your blood sugar being too high or too low.  please keep a record of the readings and bring it to your next appointment here.  please call us sooner if you are having low blood sugar episodes. good diet and exercise habits significanly improve the control of your diabetes.  please let me know if you wish to be referred to a dietician.  high blood sugar is very risky to your health.  you should see an eye doctor every year. controlling your blood pressure and cholesterol drastically reduces the damage diabetes does to your body.  this also applies to quitting smoking.  please discuss these with your doctor.  you should take an aspirin every day, unless you have been advised by a doctor not to.

## 2011-01-13 ENCOUNTER — Other Ambulatory Visit: Payer: Self-pay | Admitting: Endocrinology

## 2011-02-15 ENCOUNTER — Telehealth: Payer: Self-pay | Admitting: *Deleted

## 2011-02-15 NOTE — Telephone Encounter (Signed)
Ok.  Same dosage 

## 2011-02-15 NOTE — Telephone Encounter (Signed)
[facility] called req a call back regarding pt's insulin Rx

## 2011-02-15 NOTE — Telephone Encounter (Signed)
[place] no longer can get Humulin N and want to know if rx can be changed to Novolin N-please advise

## 2011-02-16 MED ORDER — INSULIN NPH (HUMAN) (ISOPHANE) 100 UNIT/ML ~~LOC~~ SUSP: 130.0000 [IU] | Freq: Two times a day (BID) | SUBCUTANEOUS | Status: DC

## 2011-02-16 NOTE — Telephone Encounter (Signed)
Rx sent to [facility].

## 2011-02-16 NOTE — Telephone Encounter (Signed)
Pt's wife informed of insulin change.

## 2011-04-01 ENCOUNTER — Encounter: Payer: Self-pay | Admitting: Endocrinology

## 2011-04-01 ENCOUNTER — Ambulatory Visit (INDEPENDENT_AMBULATORY_CARE_PROVIDER_SITE_OTHER): Payer: Medicare Other | Admitting: Endocrinology

## 2011-04-01 ENCOUNTER — Other Ambulatory Visit (INDEPENDENT_AMBULATORY_CARE_PROVIDER_SITE_OTHER): Payer: Medicare Other

## 2011-04-01 VITALS — BP 134/76 | HR 83 | Temp 97.9°F | Ht 69.0 in | Wt 243.1 lb

## 2011-04-01 DIAGNOSIS — IMO0002 Reserved for concepts with insufficient information to code with codable children: Secondary | ICD-10-CM

## 2011-04-01 DIAGNOSIS — E1065 Type 1 diabetes mellitus with hyperglycemia: Secondary | ICD-10-CM

## 2011-04-01 LAB — HEMOGLOBIN A1C: Hgb A1c MFr Bld: 9.1 % — ABNORMAL HIGH (ref 4.6–6.5)

## 2011-04-01 NOTE — Patient Instructions (Addendum)
blood tests are being ordered for you today.  please call [phone] to hear your test results.  You will be prompted to enter the 9-digit "MRN" number that appears at the top left of this page, followed by #.  Then you will hear the message. pending the test results, please continue nph insulin 125 units two times a day.  however, if you are going to work overnight, be very active, or eating less than usual, take only 70 units.  If you are unable to anticipate the activity, eat a light snack with it.   Please schedule a follow-up appointment in 3 months.   check your blood sugar 2 times a day.  vary the time of day when you check, between before the 3 meals, and at bedtime.  also check if you have symptoms of your blood sugar being too high or too low.  please keep a record of the readings and bring it to your next appointment here.  please call us sooner if you are having low blood sugar episodes.

## 2011-04-01 NOTE — Progress Notes (Signed)
Subjective:    Patient ID: Eric Choi, male    DOB: [birth year], 70 y.o.   MRN: [id number]  HPI pt returns for f/u of insulin-requiring diabetes (1990).  He states he feels well in general. he brings a record of his cbg's which i have reviewed today.  He had to reduce insulin to 125 units bid, due to hypoglycemia.  Since then, cbg's vary from 76-137.  He finds it difficult to see any pattern in his cbg's due to 3rd shift work.    Past Medical History  Diagnosis Date  . DIABETES MELLITUS, TYPE II 11/03/2006  . HYPERLIPIDEMIA 11/03/2006  . HYPERTENSION 11/03/2006  . CAD (coronary artery disease)     Past Surgical History  Procedure Date  . Angioplasty     percutaneous transluminal   . Cataract extraction 2002, 2005    bilateral  . Ptca     History   Social History  . Marital Status: Married    Spouse Name: N/A    Number of Children: N/A  . Years of Education: N/A   Occupational History  . Not on file.   Social History Main Topics  . Smoking status: Former Research scientist (life sciences)  . Smokeless tobacco: Not on file  . Alcohol Use: No  . Drug Use: No  . Sexually Active:    Other Topics Concern  . Not on file   Social History Narrative   Works 3rd shiftRegular Exercise-yes    Current Outpatient Prescriptions on File Prior to Visit  Medication Sig Dispense Refill  . ALPHA LIPOIC ACID PO Take by mouth.        Marland Kitchen amLODipine (NORVASC) 10 MG tablet TAKE ONE TABLET BY MOUTH DAILY  90 tablet  1  . aspirin 81 MG tablet Take 81 mg by mouth daily.        . Calcium-Vitamin D 600-200 MG-UNIT per tablet Take 1 tablet by mouth daily.        . fish oil-omega-3 fatty acids 1000 MG capsule Take 2 g by mouth daily.        Marland Kitchen glucose blood (BAYER CONTOUR TEST) test strip Use as instructed  100 each  12  . insulin NPH (NOVOLIN N) 100 UNIT/ML injection Inject 130 Units into the skin 2 (two) times daily.  80 mL  5  . lisinopril (PRINIVIL,ZESTRIL) 20 MG tablet Take 1 tablet (20 mg total) by mouth daily.  100  tablet  3  . metoprolol (LOPRESSOR) 50 MG tablet Take 1 tablet (50 mg total) by mouth daily.  100 tablet  3  . NEEDLE, DISP, 30 G (B-D DISP NEEDLE 30GX1") 30G X 1" MISC by Does not apply route as directed.        . simvastatin (ZOCOR) 40 MG tablet Take 1 tablet (40 mg total) by mouth at bedtime.  100 tablet  3  . Specialty Vitamins Products (MAGNESIUM, AMINO ACID CHELATE,) 133 MG tablet Take 1 tablet by mouth daily.          No Known Allergies  Family History  Problem Relation Age of Onset  . COPD Father   . Alcohol abuse Father   . Diabetes Father   . Cancer Sister     breast    BP 134/76  Pulse 83  Temp(Src) 97.9 F (36.6 C) (Oral)  Ht 5\' 9"  (1.753 m)  Wt 243 lb 1.3 oz (110.26 kg)  BMI 35.90 kg/m2  SpO2 95%  Review of Systems denies hypoglycemia    Objective:  Physical Exam VITAL SIGNS:  See vs page GENERAL: no distress Pulses: dorsalis pedis intact bilat.   Feet: no deformity.  no ulcer on the feet.  feet are of normal color and temp.  Trace bilat leg edema.  There is bilateral onychomycosis Neuro: sensation is intact to touch on the feet     Assessment & Plan:  Dm. therapy is limited by lack of cbg record, need for a simple, inexpensive insulin schedule, and 3rd shift work.

## 2011-06-30 ENCOUNTER — Encounter: Payer: Self-pay | Admitting: Endocrinology

## 2011-06-30 ENCOUNTER — Other Ambulatory Visit (INDEPENDENT_AMBULATORY_CARE_PROVIDER_SITE_OTHER): Payer: Medicare Other

## 2011-06-30 ENCOUNTER — Ambulatory Visit (INDEPENDENT_AMBULATORY_CARE_PROVIDER_SITE_OTHER): Payer: Medicare Other | Admitting: Endocrinology

## 2011-06-30 VITALS — BP 102/60 | HR 83 | Temp 97.0°F | Ht 69.0 in | Wt 239.0 lb

## 2011-06-30 DIAGNOSIS — E1065 Type 1 diabetes mellitus with hyperglycemia: Secondary | ICD-10-CM

## 2011-06-30 DIAGNOSIS — IMO0002 Reserved for concepts with insufficient information to code with codable children: Secondary | ICD-10-CM

## 2011-06-30 LAB — HEMOGLOBIN A1C: Hgb A1c MFr Bld: 9 % — ABNORMAL HIGH (ref 4.6–6.5)

## 2011-06-30 NOTE — Progress Notes (Signed)
Subjective:    Patient ID: Eric Choi, male    DOB: 1940/07/05, 71 y.o.   MRN: [id number]  HPI pt returns for f/u of insulin-requiring diabetes (1990).  He states he feels well in general. no cbg record, but states he had to reduce insulin to 120 units bid, due to hypoglycemia.  Since then, cbg's vary from 76-137.  He finds it difficult to see any pattern in his cbg's due to 3rd shift work.  He has lost a few lbs, due to his efforts.   Past Medical History  Diagnosis Date  . DIABETES MELLITUS, TYPE II 11/03/2006  . HYPERLIPIDEMIA 11/03/2006  . HYPERTENSION 11/03/2006  . CAD (coronary artery disease)     Past Surgical History  Procedure Date  . Angioplasty     percutaneous transluminal   . Cataract extraction 2002, 2005    bilateral  . Ptca     History   Social History  . Marital Status: Married    Spouse Name: N/A    Number of Children: N/A  . Years of Education: N/A   Occupational History  . Not on file.   Social History Main Topics  . Smoking status: Former Research scientist (life sciences)  . Smokeless tobacco: Not on file  . Alcohol Use: No  . Drug Use: No  . Sexually Active:    Other Topics Concern  . Not on file   Social History Narrative   Works 3rd shiftRegular Exercise-yes    Current Outpatient Prescriptions on File Prior to Visit  Medication Sig Dispense Refill  . ALPHA LIPOIC ACID PO Take by mouth.        Marland Kitchen amLODipine (NORVASC) 10 MG tablet TAKE ONE TABLET BY MOUTH DAILY  90 tablet  1  . aspirin 81 MG tablet Take 81 mg by mouth daily.        . Calcium-Vitamin D 600-200 MG-UNIT per tablet Take 1 tablet by mouth daily.        . fish oil-omega-3 fatty acids 1000 MG capsule Take 2 g by mouth daily.        Marland Kitchen glucose blood (BAYER CONTOUR TEST) test strip Use as instructed  100 each  12  . lisinopril (PRINIVIL,ZESTRIL) 20 MG tablet Take 1 tablet (20 mg total) by mouth daily.  100 tablet  3  . metoprolol (LOPRESSOR) 50 MG tablet Take 1 tablet (50 mg total) by mouth daily.  100  tablet  3  . NEEDLE, DISP, 30 G (B-D DISP NEEDLE 30GX1") 30G X 1" MISC by Does not apply route as directed.        . simvastatin (ZOCOR) 40 MG tablet Take 1 tablet (40 mg total) by mouth at bedtime.  100 tablet  3  . Specialty Vitamins Products (MAGNESIUM, AMINO ACID CHELATE,) 133 MG tablet Take 1 tablet by mouth daily.          No Known Allergies  Family History  Problem Relation Age of Onset  . COPD Father   . Alcohol abuse Father   . Diabetes Father   . Cancer Sister     breast    BP 102/60  Pulse 83  Temp(Src) 97 F (36.1 C) (Oral)  Ht 5\' 9"  (1.753 m)  Wt 239 lb (108.41 kg)  BMI 35.29 kg/m2  SpO2 96%   Review of Systems Denies loc.    Objective:   Physical Exam Pulses: dorsalis pedis intact bilat.   Feet: no deformity.  no ulcer on the feet.  feet are of  normal color and temp.  Trace bilat leg edema.  There is bilateral onychomycosis Neuro: sensation is intact to touch on the feet.   Lab Results  Component Value Date   HGBA1C 9.0* 06/30/2011      Assessment & Plan:  DM, needs increased rx.  therapy limited by pt's need for a simple, inexpensive regimen, and by 3rd shift work

## 2011-06-30 NOTE — Patient Instructions (Addendum)
A diabetes blood test are being ordered for you today.  please call [phone] to hear your test results.  You will be prompted to enter the 9-digit "MRN" number that appears at the top left of this page, followed by #.  Then you will hear the message. pending the test results, please continue nph insulin 120 units two times a day.  however, if you are going to work overnight, be very active, or eating less than usual, take only 70 units.  If you are unable to anticipate the activity, eat a light snack with it.   Please schedule a follow-up appointment in 3 months.   check your blood sugar 2 times a day.  vary the time of day when you check, between before the 3 meals, and at bedtime.  also check if you have symptoms of your blood sugar being too high or too low.  please keep a record of the readings and bring it to your next appointment here.  please call us sooner if you are having low blood sugar episodes.   (update: i left message on phone-tree:  Increase to 140 bid.  Take only 70 if exertion is expected)

## 2011-10-01 ENCOUNTER — Other Ambulatory Visit (INDEPENDENT_AMBULATORY_CARE_PROVIDER_SITE_OTHER): Payer: Medicare Other

## 2011-10-01 ENCOUNTER — Ambulatory Visit (INDEPENDENT_AMBULATORY_CARE_PROVIDER_SITE_OTHER): Payer: Medicare Other | Admitting: Endocrinology

## 2011-10-01 ENCOUNTER — Encounter: Payer: Self-pay | Admitting: Endocrinology

## 2011-10-01 VITALS — BP 110/72 | HR 84 | Temp 97.1°F | Wt 229.0 lb

## 2011-10-01 DIAGNOSIS — IMO0002 Reserved for concepts with insufficient information to code with codable children: Secondary | ICD-10-CM

## 2011-10-01 DIAGNOSIS — E1065 Type 1 diabetes mellitus with hyperglycemia: Secondary | ICD-10-CM

## 2011-10-01 DIAGNOSIS — I2510 Atherosclerotic heart disease of native coronary artery without angina pectoris: Secondary | ICD-10-CM | POA: Insufficient documentation

## 2011-10-01 LAB — HEMOGLOBIN A1C: Hgb A1c MFr Bld: 8.5 % — ABNORMAL HIGH (ref 4.6–6.5)

## 2011-10-01 NOTE — Progress Notes (Signed)
  Subjective:    Patient ID: Eric Choi, male    DOB: May 21, 1940, 71 y.o.   MRN: [id number]  HPI pt returns for f/u of insulin-requiring diabetes (dx'ed 0000000; complicated by retinopathy and CAD).  Pt says he had frequent hypoglycemia when he took 140 units.   Past Medical History  Diagnosis Date  . DIABETES MELLITUS, TYPE II 11/03/2006  . HYPERLIPIDEMIA 11/03/2006  . HYPERTENSION 11/03/2006  . CAD (coronary artery disease)     Past Surgical History  Procedure Date  . Angioplasty     percutaneous transluminal   . Cataract extraction 2002, 2005    bilateral  . Ptca     History   Social History  . Marital Status: Married    Spouse Name: N/A    Number of Children: N/A  . Years of Education: N/A   Occupational History  . Not on file.   Social History Main Topics  . Smoking status: Former Research scientist (life sciences)  . Smokeless tobacco: Not on file  . Alcohol Use: No  . Drug Use: No  . Sexually Active:    Other Topics Concern  . Not on file   Social History Narrative   Works 3rd shiftRegular Exercise-yes    Current Outpatient Prescriptions on File Prior to Visit  Medication Sig Dispense Refill  . ALPHA LIPOIC ACID PO Take by mouth.        Marland Kitchen amLODipine (NORVASC) 10 MG tablet TAKE ONE TABLET BY MOUTH DAILY  90 tablet  1  . aspirin 81 MG tablet Take 81 mg by mouth daily.        . Calcium-Vitamin D 600-200 MG-UNIT per tablet Take 1 tablet by mouth daily.        . fish oil-omega-3 fatty acids 1000 MG capsule Take 2 g by mouth daily.        Marland Kitchen glucose blood (BAYER CONTOUR TEST) test strip Use as instructed  100 each  12  . insulin NPH (HUMULIN N,NOVOLIN N) 100 UNIT/ML injection Inject 130 Units into the skin 2 (two) times daily.       Marland Kitchen lisinopril (PRINIVIL,ZESTRIL) 20 MG tablet Take 1 tablet (20 mg total) by mouth daily.  100 tablet  3  . metoprolol (LOPRESSOR) 50 MG tablet Take 1 tablet (50 mg total) by mouth daily.  100 tablet  3  . NEEDLE, DISP, 30 G (B-D DISP NEEDLE 30GX1") 30G X 1"  MISC by Does not apply route as directed.        . simvastatin (ZOCOR) 40 MG tablet Take 1 tablet (40 mg total) by mouth at bedtime.  100 tablet  3  . Specialty Vitamins Products (MAGNESIUM, AMINO ACID CHELATE,) 133 MG tablet Take 1 tablet by mouth daily.          No Known Allergies  Family History  Problem Relation Age of Onset  . COPD Father   . Alcohol abuse Father   . Diabetes Father   . Cancer Sister     breast    BP 110/72  Pulse 84  Temp(Src) 97.1 F (36.2 C) (Oral)  Wt 229 lb (103.874 kg)  SpO2 95%  Review of Systems Denies loc    Objective:   Physical Exam VITAL SIGNS:  See vs page GENERAL: no distress. SKIN:  Insulin injection sites at the anterior abdomen are normal      Assessment & Plan:

## 2011-10-01 NOTE — Patient Instructions (Addendum)
blood tests are being requested for you today.  You will receive a letter with results. pending the test results, please continue nph insulin 130 units two times a day.  however, if you are going to work overnight, be very active, or eating less than usual, take only 70 units.  If you are unable to anticipate the activity, eat a light snack with it.   Please schedule a follow-up appointment in 3 months.   check your blood sugar 2 times a day.  vary the time of day when you check, between before the 3 meals, and at bedtime.  also check if you have symptoms of your blood sugar being too high or too low.  please keep a record of the readings and bring it to your next appointment here.  please call us sooner if you are having low blood sugar episodes.

## 2011-10-04 ENCOUNTER — Telehealth: Payer: Self-pay | Admitting: *Deleted

## 2011-10-04 NOTE — Telephone Encounter (Signed)
Called pt to inform of lab results, pt informed (letter also mailed to pt). 

## 2011-10-23 ENCOUNTER — Other Ambulatory Visit: Payer: Self-pay | Admitting: Family Medicine

## 2011-11-29 ENCOUNTER — Other Ambulatory Visit: Payer: Self-pay | Admitting: Family Medicine

## 2011-12-07 LAB — HM DIABETES EYE EXAM

## 2011-12-20 ENCOUNTER — Other Ambulatory Visit: Payer: Self-pay | Admitting: Family Medicine

## 2011-12-31 ENCOUNTER — Other Ambulatory Visit: Payer: Self-pay | Admitting: Family Medicine

## 2012-01-05 ENCOUNTER — Ambulatory Visit (INDEPENDENT_AMBULATORY_CARE_PROVIDER_SITE_OTHER): Payer: Medicare Other | Admitting: Endocrinology

## 2012-01-05 ENCOUNTER — Other Ambulatory Visit (INDEPENDENT_AMBULATORY_CARE_PROVIDER_SITE_OTHER): Payer: Medicare Other

## 2012-01-05 ENCOUNTER — Encounter: Payer: Self-pay | Admitting: Endocrinology

## 2012-01-05 VITALS — BP 118/68 | HR 73 | Temp 97.4°F | Ht 67.0 in | Wt 241.0 lb

## 2012-01-05 DIAGNOSIS — E1065 Type 1 diabetes mellitus with hyperglycemia: Secondary | ICD-10-CM

## 2012-01-05 DIAGNOSIS — IMO0002 Reserved for concepts with insufficient information to code with codable children: Secondary | ICD-10-CM

## 2012-01-05 LAB — HEMOGLOBIN A1C: Hgb A1c MFr Bld: 8.3 % — ABNORMAL HIGH (ref 4.6–6.5)

## 2012-01-05 MED ORDER — GLUCOSE BLOOD VI STRP: ORAL_STRIP | Status: DC

## 2012-01-05 NOTE — Patient Instructions (Addendum)
blood tests are being requested for you today.  You will receive a letter with results. pending the test results, please continue nph insulin 130 units two times a day.  however, if you are going to work overnight, be very active, or eating less than usual, take only 70 units.  For your safety, take these insulin amounts no matter what your blood sugar is.  If you are unable to anticipate the activity, eat a light snack with it.   Please schedule a follow-up appointment in 3 months.   check your blood sugar 2 times a day.  vary the time of day when you check, between before the 3 meals, and at bedtime.  also check if you have symptoms of your blood sugar being too high or too low.  please keep a record of the readings and bring it to your next appointment here.  please call us sooner if you are having low blood sugar episodes.

## 2012-01-05 NOTE — Progress Notes (Signed)
Subjective:    Patient ID: Eric Choi, male    DOB: 1940-12-29, 71 y.o.   MRN: [id number]  HPI pt returns for f/u of insulin-requiring diabetes (dx'ed 0000000; complicated by retinopathy and CAD; he has requested a simple, inexpensive insulin regimen).  Pt says he continues to have hypoglycemia when he works overnight, or is active.  He says he takes extra insulin if cbg is over 150.  He is about to reduce his work schedule, though.   Past Medical History  Diagnosis Date  . DIABETES MELLITUS, TYPE II 11/03/2006  . HYPERLIPIDEMIA 11/03/2006  . HYPERTENSION 11/03/2006  . CAD (coronary artery disease)     Past Surgical History  Procedure Date  . Angioplasty     percutaneous transluminal   . Cataract extraction 2002, 2005    bilateral  . Ptca     History   Social History  . Marital Status: Married    Spouse Name: N/A    Number of Children: N/A  . Years of Education: N/A   Occupational History  . Not on file.   Social History Main Topics  . Smoking status: Former Research scientist (life sciences)  . Smokeless tobacco: Not on file  . Alcohol Use: No  . Drug Use: No  . Sexually Active:    Other Topics Concern  . Not on file   Social History Narrative   Works 3rd shiftRegular Exercise-yes    Current Outpatient Prescriptions on File Prior to Visit  Medication Sig Dispense Refill  . ALPHA LIPOIC ACID PO Take by mouth.        Marland Kitchen amLODipine (NORVASC) 10 MG tablet TAKE ONE TABLET BY MOUTH DAILY  90 tablet  1  . amLODipine (NORVASC) 10 MG tablet TAKE ONE TABLET BY MOUTH DAILY  90 tablet  2  . aspirin 81 MG tablet Take 81 mg by mouth daily.        . Calcium-Vitamin D 600-200 MG-UNIT per tablet Take 1 tablet by mouth daily.        . fish oil-omega-3 fatty acids 1000 MG capsule Take 2 g by mouth daily.        . insulin NPH (HUMULIN N,NOVOLIN N) 100 UNIT/ML injection Inject 130 Units into the skin 2 (two) times daily.       Marland Kitchen lisinopril (PRINIVIL,ZESTRIL) 20 MG tablet TAKE 1 TABLET (20 MG TOTAL) BY MOUTH  DAILY.  90 tablet  0  . metoprolol (LOPRESSOR) 50 MG tablet TAKE ONE TABLET BY MOUTH DAILY. **NEED OFFICE VISIT**  60 tablet  0  . NEEDLE, DISP, 30 G (B-D DISP NEEDLE 30GX1") 30G X 1" MISC by Does not apply route as directed.        . simvastatin (ZOCOR) 40 MG tablet TAKE 1 TABLET (40 MG TOTAL) BY MOUTH AT BEDTIME.  30 tablet  0  . Specialty Vitamins Products (MAGNESIUM, AMINO ACID CHELATE,) 133 MG tablet Take 1 tablet by mouth daily.          No Known Allergies  Family History  Problem Relation Age of Onset  . COPD Father   . Alcohol abuse Father   . Diabetes Father   . Cancer Sister     breast   BP 118/68  Pulse 73  Temp 97.4 F (36.3 C) (Oral)  Ht 5\' 7"  (1.702 m)  Wt 241 lb (109.317 kg)  BMI 37.75 kg/m2  SpO2 95%  Review of Systems Denies LOC.    Objective:   Physical Exam Pulses: dorsalis pedis intact bilat.  Feet: no deformity. no ulcer on the feet. feet are of normal color and temp. Trace bilat leg edema. There is bilateral onychomycosis.   Neuro: sensation is intact to touch on the feet.     Assessment & Plan:  DM.  therapy limited by noncompliance.  i'll do the best i can.

## 2012-01-06 ENCOUNTER — Telehealth: Payer: Self-pay | Admitting: *Deleted

## 2012-01-06 NOTE — Telephone Encounter (Signed)
Called pt to inform of lab results, pt's spouse informed (letter also mailed to pt).

## 2012-01-19 ENCOUNTER — Other Ambulatory Visit: Payer: Self-pay | Admitting: Endocrinology

## 2012-02-08 ENCOUNTER — Encounter: Payer: Self-pay | Admitting: Family Medicine

## 2012-02-08 ENCOUNTER — Ambulatory Visit (INDEPENDENT_AMBULATORY_CARE_PROVIDER_SITE_OTHER): Payer: Medicare Other | Admitting: Family Medicine

## 2012-02-08 VITALS — BP 140/80 | Temp 98.0°F | Wt 243.0 lb

## 2012-02-08 DIAGNOSIS — Z23 Encounter for immunization: Secondary | ICD-10-CM

## 2012-02-08 DIAGNOSIS — E1065 Type 1 diabetes mellitus with hyperglycemia: Secondary | ICD-10-CM

## 2012-02-08 DIAGNOSIS — IMO0002 Reserved for concepts with insufficient information to code with codable children: Secondary | ICD-10-CM

## 2012-02-08 MED ORDER — METFORMIN HCL 500 MG PO TABS: ORAL_TABLET | ORAL | Status: DC

## 2012-02-08 MED ORDER — SIMVASTATIN 40 MG PO TABS: 40.0000 mg | ORAL_TABLET | Freq: Every day | ORAL | Status: DC

## 2012-02-08 NOTE — Patient Instructions (Signed)
Continue your current dose of insulin  Add metformin 500 mg,,,,,,,,,,,, one half tab prior to breakfast and one half tab prior to evening male  Return in 3 months for followup  Labs nonfasting one week prior

## 2012-02-08 NOTE — Progress Notes (Signed)
  Subjective:    Patient ID: Eric Choi, male    DOB: [birth year], 71 y.o.   MRN: [id number]  HPI Eric Choi is a 71 year old male nonsmoker who comes in today for followup of diabetes type 1  He is not working third shift anymore and his blood sugars under much better control however his A1c is still 8.3%. His fasting blood sugar this morning at home was 210 he's reluctant to tighten up on his diabetes because when he does he states his blood sugar drops in the 60 and 70 range. He's driving part time. He currently takes 130 units of insulin twice daily. 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

## 2012-02-09 ENCOUNTER — Telehealth: Payer: Self-pay | Admitting: Family Medicine

## 2012-02-09 DIAGNOSIS — E1065 Type 1 diabetes mellitus with hyperglycemia: Secondary | ICD-10-CM

## 2012-02-09 DIAGNOSIS — IMO0002 Reserved for concepts with insufficient information to code with codable children: Secondary | ICD-10-CM

## 2012-02-09 MED ORDER — SIMVASTATIN 40 MG PO TABS: 40.0000 mg | ORAL_TABLET | Freq: Every day | ORAL | Status: DC

## 2012-02-09 MED ORDER — METFORMIN HCL 500 MG PO TABS: ORAL_TABLET | ORAL | Status: DC

## 2012-02-09 NOTE — Telephone Encounter (Signed)
Patient's spouse called stating that he was in to see the MD on yesterday and his rxs were called into [facility] and should have gone into [facility] on [address]. Please assist and inform patient when done.

## 2012-02-09 NOTE — Telephone Encounter (Signed)
Rx sent to [facility] 

## 2012-02-23 ENCOUNTER — Other Ambulatory Visit: Payer: Self-pay | Admitting: Family Medicine

## 2012-03-20 ENCOUNTER — Other Ambulatory Visit: Payer: Self-pay | Admitting: Family Medicine

## 2012-03-24 ENCOUNTER — Telehealth: Payer: Self-pay | Admitting: *Deleted

## 2012-03-24 MED ORDER — BAYER CONTOUR NEXT EZ W/DEVICE KIT: 1.0000 | PACK | Freq: Once | Status: DC

## 2012-03-24 NOTE — Telephone Encounter (Signed)
Left msg on triage stating pt is requesting renewal on all his diabetic supplies, including battery. Pls return call bck @ [phone] with reference # [id number]...lmb

## 2012-03-24 NOTE — Telephone Encounter (Signed)
Called pt, spoke with pt's wife. Advised her Dr Loanne Drilling has rx for a new meter. Pt to call on Monday to schedule ov.

## 2012-03-24 NOTE — Telephone Encounter (Signed)
Pt requesting renewal on all diabetic supplies, including battery. Please advise.

## 2012-03-24 NOTE — Telephone Encounter (Signed)
i sent rx for new meter.   Ov is due soon

## 2012-03-27 ENCOUNTER — Telehealth: Payer: Self-pay

## 2012-03-27 DIAGNOSIS — IMO0002 Reserved for concepts with insufficient information to code with codable children: Secondary | ICD-10-CM

## 2012-03-27 DIAGNOSIS — E1065 Type 1 diabetes mellitus with hyperglycemia: Secondary | ICD-10-CM

## 2012-03-27 MED ORDER — LISINOPRIL 20 MG PO TABS: 20.0000 mg | ORAL_TABLET | Freq: Every day | ORAL | Status: DC

## 2012-03-27 MED ORDER — BAYER CONTOUR NEXT EZ W/DEVICE KIT: 1.0000 | PACK | Freq: Once | Status: DC

## 2012-03-27 NOTE — Telephone Encounter (Signed)
Needs diabetic supplies sent to [facility] instead of [facility].  Pt's wife already canceled rx at [facility].

## 2012-04-04 ENCOUNTER — Ambulatory Visit: Payer: Medicare Other | Admitting: Endocrinology

## 2012-05-04 ENCOUNTER — Other Ambulatory Visit: Payer: Medicare Other

## 2012-05-11 ENCOUNTER — Ambulatory Visit: Payer: Medicare Other | Admitting: Family Medicine

## 2012-06-09 ENCOUNTER — Other Ambulatory Visit (INDEPENDENT_AMBULATORY_CARE_PROVIDER_SITE_OTHER): Payer: Medicare Other

## 2012-06-09 DIAGNOSIS — E1065 Type 1 diabetes mellitus with hyperglycemia: Secondary | ICD-10-CM

## 2012-06-09 DIAGNOSIS — IMO0002 Reserved for concepts with insufficient information to code with codable children: Secondary | ICD-10-CM

## 2012-06-09 LAB — BASIC METABOLIC PANEL
BUN: 20 mg/dL (ref 6–23)
CO2: 29 mEq/L (ref 19–32)
Calcium: 9.8 mg/dL (ref 8.4–10.5)
Chloride: 104 mEq/L (ref 96–112)
Creatinine, Ser: 1.4 mg/dL (ref 0.4–1.5)
GFR: 55.06 mL/min — ABNORMAL LOW (ref >60.00)
Glucose, Bld: 133 mg/dL — ABNORMAL HIGH (ref 70–99)
Potassium: 4.5 mEq/L (ref 3.5–5.1)
Sodium: 140 mEq/L (ref 135–145)

## 2012-06-09 LAB — MICROALBUMIN / CREATININE URINE RATIO
Creatinine,U: 157.1 mg/dL
Microalb Creat Ratio: 5.5 mg/g (ref 0.0–30.0)
Microalb, Ur: 8.6 mg/dL — ABNORMAL HIGH (ref 0.0–1.9)

## 2012-06-09 LAB — HEMOGLOBIN A1C: Hgb A1c MFr Bld: 8.6 % — ABNORMAL HIGH (ref 4.6–6.5)

## 2012-06-23 ENCOUNTER — Other Ambulatory Visit: Payer: Self-pay

## 2012-06-23 ENCOUNTER — Other Ambulatory Visit: Payer: Self-pay | Admitting: Endocrinology

## 2012-06-23 NOTE — Telephone Encounter (Signed)
PATIENT PHARMACY REQUEST REFILL ON MEDICATION  NOVOLIN N RELION. DO NOT SEE ON PATIENT MED LIST. PLEASE ADVISE.

## 2012-06-23 NOTE — Telephone Encounter (Signed)
Ov is due 

## 2012-07-04 ENCOUNTER — Encounter: Payer: Self-pay | Admitting: Family Medicine

## 2012-07-04 ENCOUNTER — Ambulatory Visit (INDEPENDENT_AMBULATORY_CARE_PROVIDER_SITE_OTHER): Payer: Medicare Other | Admitting: Family Medicine

## 2012-07-04 VITALS — BP 120/70 | Temp 98.4°F | Wt 246.0 lb

## 2012-07-04 DIAGNOSIS — IMO0002 Reserved for concepts with insufficient information to code with codable children: Secondary | ICD-10-CM

## 2012-07-04 DIAGNOSIS — E1065 Type 1 diabetes mellitus with hyperglycemia: Secondary | ICD-10-CM

## 2012-07-04 MED ORDER — INSULIN NPH (HUMAN) (ISOPHANE) 100 UNIT/ML ~~LOC~~ SUSP: 130.0000 [IU] | Freq: Every day | SUBCUTANEOUS | Status: DC

## 2012-07-04 NOTE — Patient Instructions (Signed)
Continue your current treatment program except increase the metformin to one full tablet before breakfast  Followup in 3 months  Labs one week prior

## 2012-07-04 NOTE — Progress Notes (Signed)
  Subjective:    Patient ID: Eric Choi, male    DOB: 11-Aug-1940, 72 y.o.   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 The with the current regime he'll have episodes in the morning worse blood sugar dropped to 60. He states he can tell his blood sugar begins to drop he has numbness and tingling in his right arm. He develops hypoglycemia about 2-3 times a week but is a little feel it checks his blood sugar and then eat something right away.  A1c is 8.6% blood sugar 133    Review of Systems Review of systems otherwise negative still working part-time driving a truck    Objective:   Physical Exam Well-developed well-nourished male in no acute distress       Assessment & Plan:  Diabetes type 1 not at goal plan increase metformin 500 mg before breakfast continue to 50 before evening meal and insulin followup A1c in 3 months

## 2012-07-06 ENCOUNTER — Other Ambulatory Visit: Payer: Self-pay | Admitting: Endocrinology

## 2012-07-06 MED ORDER — INSULIN NPH (HUMAN) (ISOPHANE) 100 UNIT/ML ~~LOC~~ SUSP: 130.0000 [IU] | Freq: Two times a day (BID) | SUBCUTANEOUS | Status: DC

## 2012-07-11 ENCOUNTER — Telehealth: Payer: Self-pay | Admitting: Family Medicine

## 2012-07-11 MED ORDER — METFORMIN HCL 500 MG PO TABS: ORAL_TABLET | ORAL | Status: DC

## 2012-07-11 NOTE — Telephone Encounter (Signed)
Per wife pt metformin directions has change please call new rx into [facility] [address]

## 2012-07-11 NOTE — Telephone Encounter (Signed)
Rx sent 

## 2012-07-27 ENCOUNTER — Other Ambulatory Visit: Payer: Self-pay | Admitting: Family Medicine

## 2012-08-28 ENCOUNTER — Telehealth: Payer: Self-pay | Admitting: Endocrinology

## 2012-08-28 NOTE — Telephone Encounter (Signed)
Pt is due an OV. Jennifer LM on VN advising pt to call for appt on 07/11/12. To date, pt has never called to sch an appt w/ Dr. Loanne Drilling / Gayleen Orem.

## 2012-08-30 ENCOUNTER — Other Ambulatory Visit: Payer: Self-pay | Admitting: Family Medicine

## 2012-09-15 ENCOUNTER — Other Ambulatory Visit (INDEPENDENT_AMBULATORY_CARE_PROVIDER_SITE_OTHER): Payer: Medicare Other

## 2012-09-15 DIAGNOSIS — E1065 Type 1 diabetes mellitus with hyperglycemia: Secondary | ICD-10-CM

## 2012-09-15 DIAGNOSIS — IMO0002 Reserved for concepts with insufficient information to code with codable children: Secondary | ICD-10-CM

## 2012-09-15 LAB — HEMOGLOBIN A1C: Hgb A1c MFr Bld: 8.5 % — ABNORMAL HIGH (ref 4.6–6.5)

## 2012-09-18 LAB — BASIC METABOLIC PANEL
BUN: 22 mg/dL (ref 6–23)
CO2: 28 mEq/L (ref 19–32)
Calcium: 9.4 mg/dL (ref 8.4–10.5)
Chloride: 102 mEq/L (ref 96–112)
Creatinine, Ser: 1.7 mg/dL — ABNORMAL HIGH (ref 0.4–1.5)
GFR: 43.41 mL/min — ABNORMAL LOW (ref >60.00)
Glucose, Bld: 114 mg/dL — ABNORMAL HIGH (ref 70–99)
Potassium: 4.4 mEq/L (ref 3.5–5.1)
Sodium: 137 mEq/L (ref 135–145)

## 2012-10-05 ENCOUNTER — Ambulatory Visit: Payer: Medicare Other | Admitting: Family Medicine

## 2012-10-12 ENCOUNTER — Ambulatory Visit (INDEPENDENT_AMBULATORY_CARE_PROVIDER_SITE_OTHER): Payer: Medicare Other | Admitting: Family Medicine

## 2012-10-12 ENCOUNTER — Encounter: Payer: Self-pay | Admitting: Family Medicine

## 2012-10-12 VITALS — BP 130/80 | Temp 97.9°F | Wt 250.0 lb

## 2012-10-12 DIAGNOSIS — I2510 Atherosclerotic heart disease of native coronary artery without angina pectoris: Secondary | ICD-10-CM

## 2012-10-12 DIAGNOSIS — IMO0002 Reserved for concepts with insufficient information to code with codable children: Secondary | ICD-10-CM

## 2012-10-12 DIAGNOSIS — E1065 Type 1 diabetes mellitus with hyperglycemia: Secondary | ICD-10-CM

## 2012-10-12 NOTE — Progress Notes (Signed)
  Subjective:    Patient ID: Eric Choi, male    DOB: [birth year], 72 y.o.   MRN: [id number]  HPI Eric Choi is a delightful 72 year old male,,,,,,,, still working part-time truck Geophysicist/field seismologist,,,,,, who comes in today for followup of diabetes type 1  He's currently on 130 units twice daily. He also takes 500 mg on metformin prior to breakfast and 250 mg prior to his evening meal. Fasting sugar 3 weeks ago 114 hemoglobin A1c however is 8.5%. And over the last year we've not been able to get his hemoglobin A1c back to normal. GI wise he can't tolerate anymore metformin   Review of Systems Review of systems otherwise negative no neuropathy    Objective:   Physical Exam Well-developed well-nourished male no acute distress vital signs stable BP 130/80 examination of feet shows no neuropathy he does have some fungal infection in his toenails. Advised to soak and file weekly       Assessment & Plan:  Diabetes type 1 not at goal plan consult with Eric Choi.  Fungal infection toes nails,,,,,,, soak and file weekly

## 2012-10-12 NOTE — Patient Instructions (Signed)
Continue your current medicines for now  We will get you set up for a consult with the endocrinologist Dr.gherghe,,,,,,,,,

## 2012-10-13 ENCOUNTER — Encounter: Payer: Self-pay | Admitting: Endocrinology

## 2012-10-13 ENCOUNTER — Ambulatory Visit (INDEPENDENT_AMBULATORY_CARE_PROVIDER_SITE_OTHER): Payer: Medicare Other | Admitting: Endocrinology

## 2012-10-13 VITALS — BP 130/80 | HR 80 | Ht 69.0 in | Wt 244.0 lb

## 2012-10-13 DIAGNOSIS — E1039 Type 1 diabetes mellitus with other diabetic ophthalmic complication: Secondary | ICD-10-CM

## 2012-10-13 DIAGNOSIS — IMO0002 Reserved for concepts with insufficient information to code with codable children: Secondary | ICD-10-CM

## 2012-10-13 DIAGNOSIS — E1065 Type 1 diabetes mellitus with hyperglycemia: Secondary | ICD-10-CM

## 2012-10-13 NOTE — Progress Notes (Signed)
Subjective:    Patient ID: Eric Choi, male    DOB: [birth year], 72 y.o.   MRN: [id number]  HPI pt returns for f/u of insulin-requiring diabetes (dx'ed 1990; he has mild if any neuropathy of the lower extremities; he has associated retinopathy and CAD; he has requested a simple, inexpensive insulin regimen; he has never had severe hypoglycemia or DKA).  no cbg record, but he says he continues to have hypoglycemia when he works overnight, or is active.  He has not been reducing his insulin in these situations, as advised.  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   Past Medical History  Diagnosis Date  . DIABETES MELLITUS, TYPE II 11/03/2006  . HYPERLIPIDEMIA 11/03/2006  . HYPERTENSION 11/03/2006  . CAD (coronary artery disease)     Past Surgical History  Procedure Laterality Date  . Angioplasty      percutaneous transluminal   . Cataract extraction  2002, 2005    bilateral  . Ptca      History   Social History  . Marital Status: Married    Spouse Name: N/A    Number of Children: N/A  . Years of Education: N/A   Occupational History  . Not on file.   Social History Main Topics  . Smoking status: Former Research scientist (life sciences)  . Smokeless tobacco: Not on file  . Alcohol Use: No  . Drug Use: No  . Sexually Active:    Other Topics Concern  . Not on file   Social History Narrative   Works 3rd shift   Regular Exercise-yes    Current Outpatient Prescriptions on File Prior to Visit  Medication Sig Dispense Refill  . amLODipine (NORVASC) 10 MG tablet TAKE ONE TABLET BY MOUTH DAILY  90 tablet  1  . aspirin 81 MG tablet Take 81 mg by mouth daily.        . Blood Glucose Monitoring Suppl (CONTOUR NEXT EZ MONITOR) W/DEVICE KIT 1 Device by Does not apply route once.  1 kit  0  . glucose blood (BAYER CONTOUR TEST) test strip Use as directed to check blood sugar twice daily dx 250.03  200 each  3  . Lancets Misc. (ACCU-CHEK MULTICLIX LANCET DEV)  KIT       . lisinopril (PRINIVIL,ZESTRIL) 20 MG tablet Take 1 tablet (20 mg total) by mouth daily.  90 tablet  2  . metFORMIN (GLUCOPHAGE) 500 MG tablet One  tab prior to breakfast and one half tab prior to evening meal  150 tablet  3  . metoprolol (LOPRESSOR) 50 MG tablet TAKE ONE TABLET BY MOUTH DAILY. **NEED OFFICE VISIT**  60 tablet  3  . NEEDLE, DISP, 30 G (B-D DISP NEEDLE 30GX1") 30G X 1" MISC by Does not apply route as directed.        . simvastatin (ZOCOR) 40 MG tablet Take 1 tablet (40 mg total) by mouth at bedtime.  100 tablet  3   No current facility-administered medications on file prior to visit.    No Known Allergies  Family History  Problem Relation Age of Onset  . COPD Father   . Alcohol abuse Father   . Diabetes Father   . Cancer Sister     breast   BP 130/80  Pulse 80  Ht 5\' 9"  (1.753 m)  Wt 244 lb (110.678 kg)  BMI 36.02 kg/m2  SpO2 98%  Review of Systems Denies LOC and weight change.  Objective:   Physical Exam VITAL SIGNS:  See vs page GENERAL: no distress.  Obese.  Lab Results  Component Value Date   HGBA1C 8.5* 09/15/2012      Assessment & Plan:  DM: The pattern of his cbg's indicates he needs some adjustment in his therapy.  therapy limited by noncompliance with insulin dosing.  i'll do the best i can.  This insulin regimen was chosen from multiple options, for its simplicity and relatively low cost.  The benefits of glycemic control must be weighed against the risks of hypoglycemia.

## 2012-10-13 NOTE — Patient Instructions (Addendum)
blood tests are being requested for you today.  We'll contact you with results. Please come back for a follow-up appointment in 3 months.   pending the test results, please increase your nph insulin to 140 units two times a day.  however, if you are going to work overnight, be very active, or eating less than usual, take only 70 units.  For your safety, take these insulin amounts no matter what your blood sugar is.  If you are unable to anticipate the activity, eat a light snack with it.   check your blood sugar 2 times a day.  vary the time of day when you check, between before the 3 meals, and at bedtime.  also check if you have symptoms of your blood sugar being too high or too low.  please keep a record of the readings and bring it to your next appointment here.  please call us sooner if you are having low blood sugar episodes.

## 2012-10-24 ENCOUNTER — Other Ambulatory Visit: Payer: Self-pay | Admitting: Family Medicine

## 2012-11-06 ENCOUNTER — Other Ambulatory Visit: Payer: Self-pay

## 2012-11-06 MED ORDER — INSULIN NPH (HUMAN) (ISOPHANE) 100 UNIT/ML ~~LOC~~ SUSP: 140.0000 [IU] | Freq: Two times a day (BID) | SUBCUTANEOUS | Status: DC

## 2012-12-13 ENCOUNTER — Other Ambulatory Visit: Payer: Self-pay | Admitting: *Deleted

## 2012-12-14 ENCOUNTER — Other Ambulatory Visit: Payer: Self-pay | Admitting: *Deleted

## 2012-12-14 MED ORDER — GLUCOSE BLOOD VI STRP: ORAL_STRIP | Status: DC

## 2012-12-14 NOTE — Telephone Encounter (Signed)
Rx refill test strips

## 2012-12-18 ENCOUNTER — Other Ambulatory Visit: Payer: Self-pay

## 2013-01-19 ENCOUNTER — Ambulatory Visit (INDEPENDENT_AMBULATORY_CARE_PROVIDER_SITE_OTHER): Payer: Medicare Other | Admitting: Endocrinology

## 2013-01-19 ENCOUNTER — Encounter: Payer: Self-pay | Admitting: Endocrinology

## 2013-01-19 VITALS — BP 130/72 | HR 78 | Ht 69.0 in | Wt 244.0 lb

## 2013-01-19 DIAGNOSIS — E1039 Type 1 diabetes mellitus with other diabetic ophthalmic complication: Secondary | ICD-10-CM

## 2013-01-19 LAB — HEMOGLOBIN A1C: Hgb A1c MFr Bld: 8.9 % — ABNORMAL HIGH (ref 4.6–6.5)

## 2013-01-19 NOTE — Patient Instructions (Addendum)
blood tests are being requested for you today.  We'll contact you with results. Please come back for a follow-up appointment in 3 months.   pending the test results, please change your nph insulin to 160 units in the morning, and 120 units in the evening.  however, if you are going to be very active, or eating less than usual, take only 70 units.  For your safety, take these insulin amounts no matter what your blood sugar is.  If you are unable to anticipate the activity, eat a light snack with it.   On this type of insulin schedule, you should eat meals on a regular schedule.  If a meal is missed or significantly delayed, your blood sugar could go low. check your blood sugar 2 times a day.  vary the time of day when you check, between before the 3 meals, and at bedtime.  also check if you have symptoms of your blood sugar being too high or too low.  please keep a record of the readings and bring it to your next appointment here.  please call us sooner if you are having low blood sugar episodes.

## 2013-01-19 NOTE — Progress Notes (Signed)
Subjective:    Patient ID: Eric Choi, male    DOB: [birth year], 72 y.o.   MRN: [id number]  HPI pt returns for f/u of insulin-requiring diabetes (dx'ed 1990; he has mild if any neuropathy of the lower extremities; he has associated retinopathy and CAD; he has requested a simple, inexpensive insulin regimen; he has never had severe hypoglycemia or DKA).  no cbg record, but he says he continues to have hypoglycemia in the early hrs of the am.  It is in general higher as the day goes on.  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   Past Medical History  Diagnosis Date  . DIABETES MELLITUS, TYPE II 11/03/2006  . HYPERLIPIDEMIA 11/03/2006  . HYPERTENSION 11/03/2006  . CAD (coronary artery disease)     Past Surgical History  Procedure Laterality Date  . Angioplasty      percutaneous transluminal   . Cataract extraction  2002, 2005    bilateral  . Ptca      History   Social History  . Marital Status: Married    Spouse Name: N/A    Number of Children: N/A  . Years of Education: N/A   Occupational History  . Not on file.   Social History Main Topics  . Smoking status: Former Research scientist (life sciences)  . Smokeless tobacco: Not on file  . Alcohol Use: No  . Drug Use: No  . Sexual Activity:    Other Topics Concern  . Not on file   Social History Narrative   Works 3rd shift   Regular Exercise-yes    Current Outpatient Prescriptions on File Prior to Visit  Medication Sig Dispense Refill  . amLODipine (NORVASC) 10 MG tablet TAKE ONE TABLET BY MOUTH DAILY  90 tablet  1  . aspirin 81 MG tablet Take 81 mg by mouth daily.        Marland Kitchen glucose blood (BAYER CONTOUR TEST) test strip Use as directed to check blood sugar twice daily dx 250.03  200 each  3  . glucose blood test strip Test blood glucose 2 times daily as instructed by physician.  200 each  3  . Lancets Misc. (ACCU-CHEK MULTICLIX LANCET DEV) KIT       . lisinopril (PRINIVIL,ZESTRIL)  20 MG tablet Take 1 tablet (20 mg total) by mouth daily.  90 tablet  2  . metoprolol (LOPRESSOR) 50 MG tablet TAKE ONE TABLET BY MOUTH DAILY. **NEED OFFICE VISIT**  60 tablet  2  . NEEDLE, DISP, 30 G (B-D DISP NEEDLE 30GX1") 30G X 1" MISC by Does not apply route as directed.        . simvastatin (ZOCOR) 40 MG tablet Take 1 tablet (40 mg total) by mouth at bedtime.  100 tablet  3   No current facility-administered medications on file prior to visit.    No Known Allergies  Family History  Problem Relation Age of Onset  . COPD Father   . Alcohol abuse Father   . Diabetes Father   . Cancer Sister     breast   BP 130/72  Pulse 78  Ht 5\' 9"  (1.753 m)  Wt 244 lb (110.678 kg)  BMI 36.02 kg/m2  SpO2 98%  Review of Systems Denies LOC.  He has lost weight, due to his efforts.     Objective:   Physical Exam VITAL SIGNS:  See vs page GENERAL: no distress  Lab Results  Component Value  Date   HGBA1C 8.9* 01/19/2013      Assessment & Plan:  DM: The pattern of his cbg's indicates he needs some adjustment in his therapy.  This insulin regimen was chosen from multiple options, for its simplicity and relatively low cost.  The benefits of glycemic control must be weighed against the risks of hypoglycemia.   CAD: in this context, he should avoid hypoglycemia.

## 2013-02-05 ENCOUNTER — Other Ambulatory Visit: Payer: Self-pay | Admitting: Family Medicine

## 2013-03-02 ENCOUNTER — Other Ambulatory Visit: Payer: Self-pay | Admitting: Family Medicine

## 2013-03-14 ENCOUNTER — Other Ambulatory Visit: Payer: Self-pay | Admitting: Family Medicine

## 2013-04-26 ENCOUNTER — Other Ambulatory Visit: Payer: Self-pay | Admitting: Family Medicine

## 2013-04-27 ENCOUNTER — Encounter: Payer: Self-pay | Admitting: Endocrinology

## 2013-04-27 ENCOUNTER — Ambulatory Visit (INDEPENDENT_AMBULATORY_CARE_PROVIDER_SITE_OTHER): Payer: Medicare Other | Admitting: Endocrinology

## 2013-04-27 VITALS — BP 124/80 | HR 80 | Temp 98.0°F | Ht 69.0 in | Wt 243.0 lb

## 2013-04-27 DIAGNOSIS — IMO0002 Reserved for concepts with insufficient information to code with codable children: Secondary | ICD-10-CM

## 2013-04-27 DIAGNOSIS — E1065 Type 1 diabetes mellitus with hyperglycemia: Secondary | ICD-10-CM

## 2013-04-27 LAB — HEMOGLOBIN A1C: Hgb A1c MFr Bld: 9.4 % — ABNORMAL HIGH (ref 4.6–6.5)

## 2013-04-27 NOTE — Patient Instructions (Addendum)
blood tests are being requested for you today.  We'll contact you with results. Please come back for a follow-up appointment in 3 months.   pending the test results, please continue your nph insulin, 160 units in the morning, and 120 units in the evening.  however, if you are going to be very active, or eating less than usual, take only 70 units.  For your safety, take these insulin amounts no matter what your blood sugar is.  If you are unable to anticipate the activity, eat a light snack with it.   On this type of insulin schedule, you should eat meals on a regular schedule.  If a meal is missed or significantly delayed, your blood sugar could go low. check your blood sugar 2 times a day.  vary the time of day when you check, between before the 3 meals, and at bedtime.  also check if you have symptoms of your blood sugar being too high or too low.  please keep a record of the readings and bring it to your next appointment here.  please call us sooner if you are having low blood sugar episodes.

## 2013-04-27 NOTE — Progress Notes (Signed)
Subjective:    Patient ID: Eric Choi, male    DOB: Apr 26, 1941, 72 y.o.   MRN: [id number]  HPI pt returns for f/u of insulin-requiring diabetes (dx'ed 1990, when he presented with near-syncope (glucose was 500); he has mild if any neuropathy of the lower extremities; he has associated retinopathy and CAD; he has been on insulin since 2004; he has requested a simple, inexpensive insulin regimen; he has never had severe hypoglycemia or DKA).  he brings a record of his cbg's which i have reviewed today.  It varies from 67-170.  It is in general higher as the day goes on.  In the afternoon, activity significantly lowers cbg.  However, he says the system of taking less in am if he is going to be active, is working well.   Past Medical History  Diagnosis Date  . DIABETES MELLITUS, TYPE II 11/03/2006  . HYPERLIPIDEMIA 11/03/2006  . HYPERTENSION 11/03/2006  . CAD (coronary artery disease)     Past Surgical History  Procedure Laterality Date  . Angioplasty      percutaneous transluminal   . Cataract extraction  2002, 2005    bilateral  . Ptca      History   Social History  . Marital Status: Married    Spouse Name: N/A    Number of Children: N/A  . Years of Education: N/A   Occupational History  . Not on file.   Social History Main Topics  . Smoking status: Former Research scientist (life sciences)  . Smokeless tobacco: Not on file  . Alcohol Use: No  . Drug Use: No  . Sexual Activity:    Other Topics Concern  . Not on file   Social History Narrative   Works 3rd shift   Regular Exercise-yes    Current Outpatient Prescriptions on File Prior to Visit  Medication Sig Dispense Refill  . amLODipine (NORVASC) 10 MG tablet TAKE ONE TABLET BY MOUTH DAILY  90 tablet  0  . aspirin 81 MG tablet Take 81 mg by mouth daily.        Marland Kitchen glucose blood (BAYER CONTOUR TEST) test strip Use as directed to check blood sugar twice daily dx 250.03  200 each  3  . glucose blood test strip Test blood glucose 2 times daily as  instructed by physician.  200 each  3  . insulin NPH (HUMULIN N,NOVOLIN N) 100 UNIT/ML injection 190 units in the morning, and 110 units in the evening      . Lancets Misc. (ACCU-CHEK MULTICLIX LANCET DEV) KIT       . lisinopril (PRINIVIL,ZESTRIL) 20 MG tablet Take 1 tablet (20 mg total) by mouth daily.  90 tablet  2  . lisinopril (PRINIVIL,ZESTRIL) 20 MG tablet TAKE 1 TABLET (20 MG TOTAL) BY MOUTH DAILY.  90 tablet  1  . metoprolol (LOPRESSOR) 50 MG tablet TAKE ONE TABLET BY MOUTH DAILY  60 tablet  1  . NEEDLE, DISP, 30 G (B-D DISP NEEDLE 30GX1") 30G X 1" MISC by Does not apply route as directed.        . simvastatin (ZOCOR) 40 MG tablet TAKE 1 TABLET (40 MG TOTAL) BY MOUTH AT BEDTIME.  90 tablet  2   No current facility-administered medications on file prior to visit.    No Known Allergies  Family History  Problem Relation Age of Onset  . COPD Father   . Alcohol abuse Father   . Diabetes Father   . Cancer Sister  breast   BP 124/80  Pulse 80  Temp(Src) 98 F (36.7 C) (Oral)  Ht 5\' 9"  (1.753 m)  Wt 243 lb (110.224 kg)  BMI 35.87 kg/m2  SpO2 95%  Review of Systems Denies LOC and weight change    Objective:   Physical Exam VITAL SIGNS:  See vs page GENERAL: no distress  Lab Results  Component Value Date   HGBA1C 9.4* 04/27/2013      Assessment & Plan:  DM: he needs increased rx  This insulin regimen was chosen from multiple options, for its simplicity and relatively low cost.  The benefits of glycemic control must be weighed against the risks of hypoglycemia.   CAD: in this context, he should avoid hypoglycemia.

## 2013-06-01 ENCOUNTER — Other Ambulatory Visit: Payer: Self-pay | Admitting: Family Medicine

## 2013-07-09 ENCOUNTER — Encounter: Payer: Self-pay | Admitting: Endocrinology

## 2013-07-25 ENCOUNTER — Telehealth: Payer: Self-pay | Admitting: Family Medicine

## 2013-07-25 NOTE — Telephone Encounter (Signed)
Patient Information:  Caller Name: Izora Gala  Phone: [phone]  Patient: Connolly, Trezise  Gender: Male  DOB: Apr 15, 1941  Age: 73 Years  PCP: Stevie Kern [facility])  Office Follow Up:  Does the office need to follow up with this patient?: No  Instructions For The Office: N/A  RN Note:  Pt fell approx 1 month ago, landed on his L side. Has cont'd L chest and L sided abdominal pain.  Symptoms  Reason For Call & Symptoms: Left sided abdominal and rib pain after a fall  Reviewed Health History In EMR: Yes  Reviewed Medications In EMR: Yes  Reviewed Allergies In EMR: Yes  Reviewed Surgeries / Procedures: Yes  Date of Onset of Symptoms: 06/27/2013  Guideline(s) Used:  Chest Injury  Disposition Per Guideline:   See Within 3 Days in Office  Reason For Disposition Reached:   Injury is still painful or swollen after 2 weeks  Advice Given:  Limit Activities:  Avoid strenuous activities or contact sports until the pain is gone.  Patient Will Follow Care Advice:  YES  Appointment Scheduled:  07/26/2013 09:15:00 Appointment Scheduled Provider:  Stevie Kern [facility])

## 2013-07-26 ENCOUNTER — Ambulatory Visit (INDEPENDENT_AMBULATORY_CARE_PROVIDER_SITE_OTHER): Payer: Medicare Other | Admitting: Family Medicine

## 2013-07-26 ENCOUNTER — Encounter: Payer: Self-pay | Admitting: Family Medicine

## 2013-07-26 VITALS — BP 130/68 | HR 71 | Temp 98.4°F | Resp 20 | Ht 69.0 in | Wt 245.0 lb

## 2013-07-26 DIAGNOSIS — R1012 Left upper quadrant pain: Secondary | ICD-10-CM

## 2013-07-26 NOTE — Progress Notes (Signed)
   Subjective:    Patient ID: Eric Choi, male    DOB: 07/12/1940, 73 y.o.   MRN: [id number]  HPI Eric Choi is a 73 year old male who comes in today for evaluation of abdominal pain  He states that 3 weeks ago on a Monday he developed the sudden onset of fever chills nausea vomiting and diarrhea. This went on for about 24 hours went away. Since that time he said discomfort and he points to his left upper quadrant as a source of his pain. He says it comes and goes sometimes dull sometimes sharp. Sometimes it wakes up at night and he is nauseated with a. He's had no further vomiting or diarrhea.  He has underlying hypertension BP 130/68 on medication  His underlying diabetes which is been difficult to control by Dr. Clois Comber.......... he's currently on insulin 130 units twice daily. He said no urinary tract complaints   Review of Systems    negative Objective:   Physical Exam Well-developed well-nourished male in no acute distress vital signs stable is afebrile examination the abdomen the abdomen is soft bowel sounds normal there is a small ventral hernia. Liver spleen kidneys not enlarged I cannot palpate masses there is no tenderness no rebound.  Rectal examination normal stool guaiac-negative       Assessment & Plan:  Left upper quadrant abdominal pain etiology unknown GI consult for further evaluation

## 2013-07-26 NOTE — Patient Instructions (Signed)
Call GI,,,,,,,,,,,,

## 2013-07-26 NOTE — Progress Notes (Signed)
Pre-visit discussion using our clinic review tool. No additional management support is needed unless otherwise documented below in the visit note.  

## 2013-07-27 ENCOUNTER — Ambulatory Visit (INDEPENDENT_AMBULATORY_CARE_PROVIDER_SITE_OTHER): Payer: Medicare Other | Admitting: Endocrinology

## 2013-07-27 ENCOUNTER — Encounter: Payer: Self-pay | Admitting: Endocrinology

## 2013-07-27 VITALS — BP 120/70 | HR 80 | Temp 97.9°F | Ht 69.0 in | Wt 243.0 lb

## 2013-07-27 DIAGNOSIS — E1039 Type 1 diabetes mellitus with other diabetic ophthalmic complication: Secondary | ICD-10-CM

## 2013-07-27 LAB — MICROALBUMIN / CREATININE URINE RATIO
Creatinine,U: 61.1 mg/dL
Microalb Creat Ratio: 8.7 mg/g (ref 0.0–30.0)
Microalb, Ur: 5.3 mg/dL — ABNORMAL HIGH (ref 0.0–1.9)

## 2013-07-27 LAB — HEMOGLOBIN A1C: Hgb A1c MFr Bld: 9.3 % — ABNORMAL HIGH (ref 4.6–6.5)

## 2013-07-27 NOTE — Patient Instructions (Addendum)
blood tests are being requested for you today.  We'll contact you with results. Please come back for a follow-up appointment in 3 months.   pending the test results, please change your nph insulin to 160 units in the morning, and 110 units in the evening.  however, if you are going to be very active, or eating less than usual, take only 70 units.  For your safety, take these insulin amounts no matter what your blood sugar is.  If you are unable to anticipate the activity, eat a light snack with it.   On this type of insulin schedule, you should eat meals on a regular schedule.  If a meal is missed or significantly delayed, your blood sugar could go low. check your blood sugar 2 times a day.  vary the time of day when you check, between before the 3 meals, and at bedtime.  also check if you have symptoms of your blood sugar being too high or too low.  please keep a record of the readings and bring it to your next appointment here.  please call us sooner if you are having low blood sugar episodes.

## 2013-07-27 NOTE — Progress Notes (Signed)
Subjective:    Patient ID: Eric Choi, male    DOB: 07-09-1940, 73 y.o.   MRN: [id number]  HPI pt returns for f/u of insulin-requiring diabetes (dx'ed 1990, when he presented with near-syncope (glucose was 500); he has mild if any neuropathy of the lower extremities; he has associated retinopathy and CAD; he has been on insulin since 2004; he has requested a simple, inexpensive insulin regimen; he has never had severe hypoglycemia or DKA).  He takes 135 units bid.  On further questioning, pt says he takes a widely varying dosage of insulin.  no cbg record, but states cbg's are vary from 65-200.  It is in general higher as the day goes on.  pt states he feels well in general. Past Medical History  Diagnosis Date  . DIABETES MELLITUS, TYPE II 11/03/2006  . HYPERLIPIDEMIA 11/03/2006  . HYPERTENSION 11/03/2006  . CAD (coronary artery disease)     Past Surgical History  Procedure Laterality Date  . Angioplasty      percutaneous transluminal   . Cataract extraction  2002, 2005    bilateral  . Ptca      History   Social History  . Marital Status: Married    Spouse Name: N/A    Number of Children: N/A  . Years of Education: N/A   Occupational History  . Not on file.   Social History Main Topics  . Smoking status: Former Research scientist (life sciences)  . Smokeless tobacco: Not on file  . Alcohol Use: No  . Drug Use: No  . Sexual Activity:    Other Topics Concern  . Not on file   Social History Narrative   Works 3rd shift   Regular Exercise-yes    Current Outpatient Prescriptions on File Prior to Visit  Medication Sig Dispense Refill  . amLODipine (NORVASC) 10 MG tablet TAKE ONE TABLET BY MOUTH DAILY  90 tablet  0  . aspirin 81 MG tablet Take 81 mg by mouth daily.        Marland Kitchen glucose blood (BAYER CONTOUR TEST) test strip Use as directed to check blood sugar twice daily dx 250.03  200 each  3  . glucose blood test strip Test blood glucose 2 times daily as instructed by physician.  200 each  3  .  insulin NPH (HUMULIN N,NOVOLIN N) 100 UNIT/ML injection 160 units in the morning, and 110 units in the evening      . Lancets Misc. (ACCU-CHEK MULTICLIX LANCET DEV) KIT       . lisinopril (PRINIVIL,ZESTRIL) 20 MG tablet TAKE 1 TABLET (20 MG TOTAL) BY MOUTH DAILY.  90 tablet  1  . metoprolol (LOPRESSOR) 50 MG tablet TAKE ONE TABLET BY MOUTH DAILY  60 tablet  1  . NEEDLE, DISP, 30 G (B-D DISP NEEDLE 30GX1") 30G X 1" MISC by Does not apply route as directed.        . simvastatin (ZOCOR) 40 MG tablet TAKE 1 TABLET (40 MG TOTAL) BY MOUTH AT BEDTIME.  90 tablet  2   No current facility-administered medications on file prior to visit.    No Known Allergies  Family History  Problem Relation Age of Onset  . COPD Father   . Alcohol abuse Father   . Diabetes Father   . Cancer Sister     breast    BP 120/70  Pulse 80  Temp(Src) 97.9 F (36.6 C) (Oral)  Ht 5' 9"  (1.753 m)  Wt 243 lb (110.224 kg)  BMI  35.87 kg/m2  SpO2 95%  Review of Systems He denies LOC and weight change.    Objective:   Physical Exam VITAL SIGNS:  See vs page GENERAL: no distress  Lab Results  Component Value Date   HGBA1C 9.3* 07/27/2013      Assessment & Plan:  DM: poor control Noncompliance with insulin dosing and cbg recording.  This complicates the rx of DM. CAD: in this context, he should avoid hypoglycemia

## 2013-08-27 ENCOUNTER — Other Ambulatory Visit: Payer: Self-pay | Admitting: Family Medicine

## 2013-09-04 ENCOUNTER — Other Ambulatory Visit: Payer: Self-pay | Admitting: Family Medicine

## 2013-09-12 ENCOUNTER — Ambulatory Visit (AMBULATORY_SURGERY_CENTER): Payer: Self-pay | Admitting: *Deleted

## 2013-09-12 VITALS — Ht 69.0 in | Wt 244.0 lb

## 2013-09-12 DIAGNOSIS — Z86010 Personal history of colonic polyps: Secondary | ICD-10-CM

## 2013-09-12 MED ORDER — MOVIPREP 100 G PO SOLR: ORAL | Status: DC

## 2013-09-12 NOTE — Progress Notes (Signed)
Patient denies any allergies to eggs or soy. Patient denies any problems with anesthesia. No oxygen use at home, no diet/wt loss pills per patient. Encouraged patient to check his Blood Sugar frequently during prep days, & drink lots of liquids and 24 hr. # given to patient and wife and encouraged to call us if needed. Pt verbalizes understanding. Patient states he fell about 2 months ago, and been having left side pain and nausea since then. Patient states he was checked out by MD and was fine.

## 2013-09-13 ENCOUNTER — Other Ambulatory Visit: Payer: Self-pay | Admitting: Family Medicine

## 2013-09-14 ENCOUNTER — Encounter: Payer: Self-pay | Admitting: Internal Medicine

## 2013-09-24 ENCOUNTER — Telehealth: Payer: Self-pay | Admitting: Endocrinology

## 2013-09-24 NOTE — Telephone Encounter (Signed)
Pt lost meter please call the pt's pharmacy to submit a rx. accucheck meter

## 2013-09-24 NOTE — Telephone Encounter (Signed)
Requested call back to discuss.  

## 2013-09-25 MED ORDER — ACCU-CHEK AVIVA PLUS W/DEVICE KIT: PACK | Status: DC

## 2013-09-25 NOTE — Telephone Encounter (Signed)
Rx sent for Accu-Chek Avivia Plus

## 2013-09-26 ENCOUNTER — Ambulatory Visit (AMBULATORY_SURGERY_CENTER): Payer: Medicare Other | Admitting: Internal Medicine

## 2013-09-26 ENCOUNTER — Encounter: Payer: Self-pay | Admitting: Internal Medicine

## 2013-09-26 VITALS — BP 124/74 | HR 74 | Temp 97.3°F | Resp 18 | Ht 69.0 in | Wt 244.0 lb

## 2013-09-26 DIAGNOSIS — D126 Benign neoplasm of colon, unspecified: Secondary | ICD-10-CM

## 2013-09-26 DIAGNOSIS — Z86010 Personal history of colonic polyps: Secondary | ICD-10-CM

## 2013-09-26 MED ORDER — SODIUM CHLORIDE 0.9 % IV SOLN: 500.0000 mL | INTRAVENOUS | Status: DC

## 2013-09-26 NOTE — Patient Instructions (Signed)
FOLLOW DISCHARGE INSTRUCTIONS (BLUE AND GREEN SHEETS).YOU HAD AN ENDOSCOPIC PROCEDURE TODAY AT [facility]: Refer to the procedure report that was given to you for any specific questions about what was found during the examination.  If the procedure report does not answer your questions, please call your gastroenterologist to clarify.  If you requested that your care partner not be given the details of your procedure findings, then the procedure report has been included in a sealed envelope for you to review at your convenience later.  YOU SHOULD EXPECT: Some feelings of bloating in the abdomen. Passage of more gas than usual.  Walking can help get rid of the air that was put into your GI tract during the procedure and reduce the bloating. If you had a lower endoscopy (such as a colonoscopy or flexible sigmoidoscopy) you may notice spotting of blood in your stool or on the toilet paper. If you underwent a bowel prep for your procedure, then you may not have a normal bowel movement for a few days.  DIET: Your first meal following the procedure should be a light meal and then it is ok to progress to your normal diet.  A half-sandwich or bowl of soup is an example of a good first meal.  Heavy or fried foods are harder to digest and may make you feel nauseous or bloated.  Likewise meals heavy in dairy and vegetables can cause extra gas to form and this can also increase the bloating.  Drink plenty of fluids but you should avoid alcoholic beverages for 24 hours.  ACTIVITY: Your care partner should take you home directly after the procedure.  You should plan to take it easy, moving slowly for the rest of the day.  You can resume normal activity the day after the procedure however you should NOT DRIVE or use heavy machinery for 24 hours (because of the sedation medicines used during the test).    SYMPTOMS TO REPORT IMMEDIATELY: A gastroenterologist can be reached at any hour.  During normal  business hours, 8:30 AM to 5:00 PM Monday through Friday, call [phone].  After hours and on weekends, please call the GI answering service at [phone] who will take a message and have the physician on call contact you.   Following lower endoscopy (colonoscopy or flexible sigmoidoscopy):  Excessive amounts of blood in the stool  Significant tenderness or worsening of abdominal pains  Swelling of the abdomen that is new, acute  Fever of 100F or higher    FOLLOW UP: If any biopsies were taken you will be contacted by phone or by letter within the next 1-3 weeks.  Call your gastroenterologist if you have not heard about the biopsies in 3 weeks.  Our staff will call the home number listed on your records the next business day following your procedure to check on you and address any questions or concerns that you may have at that time regarding the information given to you following your procedure. This is a courtesy call and so if there is no answer at the home number and we have not heard from you through the emergency physician on call, we will assume that you have returned to your regular daily activities without incident.  Polyp and high fiber diet information given.  Dr. Olevia Perches will advise you about next colonoscopy after pathology report is reviewed.  SIGNATURES/CONFIDENTIALITY: You and/or your care partner have signed paperwork which will be entered into your electronic medical record.  These signatures attest to the fact that that the information above on your After Visit Summary has been reviewed and is understood.  Full responsibility of the confidentiality of this discharge information lies with you and/or your care-partner.

## 2013-09-26 NOTE — Progress Notes (Signed)
Called to room to assist during endoscopic procedure.  Patient ID and intended procedure confirmed with present staff. Received instructions for my participation in the procedure from the performing physician.  

## 2013-09-26 NOTE — Progress Notes (Signed)
Report to pacu rn, vss, bbs=clear 

## 2013-09-26 NOTE — Op Note (Signed)
[facility]  [address] [place], [address]   COLONOSCOPY PROCEDURE REPORT  PATIENT: Eric, Choi  MR#: [id number] BIRTHDATE: 05-27-1940 , 73  yrs. old GENDER: Male ENDOSCOPIST: Lafayette Dragon, MD REFERRED [id number] Delora Fuel, M.D. PROCEDURE DATE:  09/26/2013 PROCEDURE:   Colonoscopy with snare polypectomy First Screening Colonoscopy - Avg.  risk and is 50 yrs.  old or older - No.  Prior Negative Screening - Now for repeat screening. 10 or more years since last screening  History of Adenoma - Now for follow-up colonoscopy & has been > or = to 3 yrs.  N/A  Polyps Removed Today? Yes. ASA CLASS:   Class II INDICATIONS:Average risk patient for colon cancer and colonoscopy October 2003 showed hyperplastic polyp. MEDICATIONS: MAC sedation, administered by CRNA and propofol (Diprivan) 200mg  IV  DESCRIPTION OF PROCEDURE:   After the risks benefits and alternatives of the procedure were thoroughly explained, informed consent was obtained.  A digital rectal exam revealed no abnormalities of the rectum.   The LB [id number]-H190 [id number] and LB CF-H180AL Loaner [id number]  endoscope was introduced through the anus and advanced to the cecum, which was identified by both the appendix and ileocecal valve. No adverse events experienced.   The quality of the prep was good, using MoviPrep  The instrument was then slowly withdrawn as the colon was fully examined.      COLON FINDINGS: A polypoid shaped semi-pedunculated polyp ranging between 3-53mm in size was found in the descending colon.  A polypectomy was performed with a cold snare.  The resection was complete and the polyp tissue was completely retrieved. Retroflexed views revealed no abnormalities. The time to cecum=4 minutes 44 seconds.  Withdrawal time=13 minutes 59 seconds.  The scope was withdrawn and the procedure completed. COMPLICATIONS: There were no complications.  ENDOSCOPIC IMPRESSION: Semi-pedunculated polyp  ranging between 3-62mm in size was found in the descending colon; polypectomy was performed with a cold snare  RECOMMENDATIONS: 1.  Await pathology results 2.  high fiber diet Recall colonoscopy pending path report   eSigned:  Lafayette Dragon, MD 09/26/2013 9:48 AM   cc:   PATIENT NAME:  Eric, Choi MR#: [id number]

## 2013-09-27 ENCOUNTER — Telehealth: Payer: Self-pay | Admitting: *Deleted

## 2013-09-27 NOTE — Telephone Encounter (Signed)
  Follow up Call-  Call back number 09/26/2013  Post procedure Call Back phone  # [phone]  Permission to leave phone message Yes     Patient questions:  Do you have a fever, pain , or abdominal swelling? no Pain Score  0 *  Have you tolerated food without any problems? yes  Have you been able to return to your normal activities? yes  Do you have any questions about your discharge instructions: Diet   no Medications  no Follow up visit  no  Do you have questions or concerns about your Care? no  Actions: * If pain score is 4 or above: No action needed, pain <4.

## 2013-10-01 ENCOUNTER — Encounter: Payer: Self-pay | Admitting: Internal Medicine

## 2013-10-02 ENCOUNTER — Encounter: Payer: Self-pay | Admitting: *Deleted

## 2013-10-19 ENCOUNTER — Encounter: Payer: Self-pay | Admitting: Endocrinology

## 2013-10-19 ENCOUNTER — Ambulatory Visit (INDEPENDENT_AMBULATORY_CARE_PROVIDER_SITE_OTHER): Payer: Medicare Other | Admitting: Endocrinology

## 2013-10-19 VITALS — BP 124/66 | HR 87 | Temp 97.9°F | Ht 69.0 in | Wt 242.0 lb

## 2013-10-19 DIAGNOSIS — E1039 Type 1 diabetes mellitus with other diabetic ophthalmic complication: Secondary | ICD-10-CM

## 2013-10-19 LAB — HEMOGLOBIN A1C: Hgb A1c MFr Bld: 9.4 % — ABNORMAL HIGH (ref 4.6–6.5)

## 2013-10-19 NOTE — Progress Notes (Signed)
Subjective:    Patient ID: Eric Choi, male    DOB: August 04, 1940, 73 y.o.   MRN: [id number]  HPI pt returns for f/u of insulin-requiring diabetes (dx'ed 1990, when he presented with near-syncope (glucose was 500); he has mild if any neuropathy of the lower extremities; he has associated retinopathy and CAD; he has been on insulin since 2004; he has requested a simple, inexpensive insulin regimen; he has never had pancreatitis, severe hypoglycemia or DKA).  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  He takes 140 units, bid.  no cbg record, but states cbg's vary from 60-200.  It is in general higher as the day goes on.  However, he has mild hypoglycemia, during the day, approx once a week, with activity.   Past Medical History  Diagnosis Date  . DIABETES MELLITUS, TYPE II 11/03/2006  . HYPERLIPIDEMIA 11/03/2006  . HYPERTENSION 11/03/2006  . CAD (coronary artery disease)     Past Surgical History  Procedure Laterality Date  . Angioplasty  20 years ago    percutaneous transluminal   . Cataract extraction  2002, 2005    bilateral  . Ptca      History   Social History  . Marital Status: Married    Spouse Name: N/A    Number of Children: N/A  . Years of Education: N/A   Occupational History  . Not on file.   Social History Main Topics  . Smoking status: Former Research scientist (life sciences)  . Smokeless tobacco: Never Used  . Alcohol Use: No  . Drug Use: No  . Sexual Activity: Not on file   Other Topics Concern  . Not on file   Social History Narrative   Works 3rd shift   Regular Exercise-yes    Current Outpatient Prescriptions on File Prior to Visit  Medication Sig Dispense Refill  . amLODipine (NORVASC) 10 MG tablet TAKE ONE TABLET BY MOUTH DAILY  90 tablet  1  . aspirin 81 MG tablet Take 81 mg by mouth daily.        . Blood Glucose Monitoring Suppl (ACCU-CHEK AVIVA PLUS) W/DEVICE KIT Use to check blood sugars 2 times per day. Dx code 250.03  1 kit  0  . glucose blood (BAYER CONTOUR TEST) test strip  Use as directed to check blood sugar twice daily dx 250.03  200 each  3  . glucose blood test strip Test blood glucose 2 times daily as instructed by physician.  200 each  3  . Lancets Misc. (ACCU-CHEK MULTICLIX LANCET DEV) KIT       . lisinopril (PRINIVIL,ZESTRIL) 20 MG tablet TAKE 1 TABLET (20 MG TOTAL) BY MOUTH DAILY.  90 tablet  3  . metoprolol (LOPRESSOR) 50 MG tablet TAKE ONE TABLET BY MOUTH DAILY  180 tablet  1  . NEEDLE, DISP, 30 G (B-D DISP NEEDLE 30GX1") 30G X 1" MISC by Does not apply route as directed.        . simvastatin (ZOCOR) 40 MG tablet TAKE 1 TABLET (40 MG TOTAL) BY MOUTH AT BEDTIME.  90 tablet  2   No current facility-administered medications on file prior to visit.    No Known Allergies  Family History  Problem Relation Age of Onset  . COPD Father   . Alcohol abuse Father   . Diabetes Father   . Cancer Sister     breast  . Colon cancer Neg Hx     BP 124/66  Pulse 87  Temp(Src) 97.9 F (36.6 C) (  Oral)  Ht 5' 9"  (1.753 m)  Wt 242 lb (109.77 kg)  BMI 35.72 kg/m2  SpO2 94%    Review of Systems He denies LOC and weight change    Objective:   Physical Exam VITAL SIGNS:  See vs page GENERAL: no distress Pulses: dorsalis pedis intact bilat.  Feet: no deformity. no ulcer on the feet. feet are of normal color and temp. Trace bilat leg edema. There is bilateral onychomycosis.  Neuro: sensation is intact to touch on the feet.  Lab Results  Component Value Date   HGBA1C 9.4* 10/19/2013      Assessment & Plan:  DM: moderate exacerbation Noncompliance with insulin dosing and cbg recording, not improved.  This complicates the rx of DM, so we can't shoot for an a1c of 7% CAD: in this context, he should avoid hypoglycemia.  This impairs the ability to achieve glycemic control.  I'll work around this as best I can.  Patient is advised the following: Patient Instructions  blood tests are being requested for you today.  We'll contact you with results.   Please  come back for a follow-up appointment in 3 months.   pending the test results, please change your nph insulin to 160 units in the morning, and 110 units in the evening.  however, if you are going to be very active, or eating less than usual, take only 70 units.  For your safety, take these insulin amounts no matter what your blood sugar is.  If you are unable to anticipate the activity, eat a light snack with it.   On this type of insulin schedule, you should eat meals on a regular schedule.  If a meal is missed or significantly delayed, your blood sugar could go low. check your blood sugar 2 times a day.  vary the time of day when you check, between before the 3 meals, and at bedtime.  also check if you have symptoms of your blood sugar being too high or too low.  please keep a record of the readings and bring it to your next appointment here.  please call us sooner if you are having low blood sugar episodes.

## 2013-10-19 NOTE — Patient Instructions (Addendum)
blood tests are being requested for you today.  We'll contact you with results.   Please come back for a follow-up appointment in 3 months.   pending the test results, please change your nph insulin to 160 units in the morning, and 110 units in the evening.  however, if you are going to be very active, or eating less than usual, take only 70 units.  For your safety, take these insulin amounts no matter what your blood sugar is.  If you are unable to anticipate the activity, eat a light snack with it.   On this type of insulin schedule, you should eat meals on a regular schedule.  If a meal is missed or significantly delayed, your blood sugar could go low. check your blood sugar 2 times a day.  vary the time of day when you check, between before the 3 meals, and at bedtime.  also check if you have symptoms of your blood sugar being too high or too low.  please keep a record of the readings and bring it to your next appointment here.  please call us sooner if you are having low blood sugar episodes.

## 2013-11-07 ENCOUNTER — Other Ambulatory Visit: Payer: Self-pay | Admitting: Family Medicine

## 2013-11-12 ENCOUNTER — Other Ambulatory Visit: Payer: Self-pay | Admitting: Endocrinology

## 2013-11-30 ENCOUNTER — Telehealth: Payer: Self-pay | Admitting: *Deleted

## 2013-11-30 NOTE — Telephone Encounter (Signed)
Left message with wife that patient will need to schedule a physical or/and have his cholesterol checked.  Patient will call back to schedule.  Labs will be ordered at that time. Diabetic bundle

## 2013-12-10 ENCOUNTER — Encounter: Payer: Self-pay | Admitting: Endocrinology

## 2013-12-10 LAB — HM DIABETES EYE EXAM

## 2014-01-18 ENCOUNTER — Encounter: Payer: Self-pay | Admitting: Endocrinology

## 2014-01-18 ENCOUNTER — Ambulatory Visit (INDEPENDENT_AMBULATORY_CARE_PROVIDER_SITE_OTHER): Payer: Medicare Other | Admitting: Endocrinology

## 2014-01-18 VITALS — BP 112/60 | HR 84 | Temp 97.6°F | Ht 69.0 in | Wt 247.0 lb

## 2014-01-18 DIAGNOSIS — E1039 Type 1 diabetes mellitus with other diabetic ophthalmic complication: Secondary | ICD-10-CM

## 2014-01-18 DIAGNOSIS — Z23 Encounter for immunization: Secondary | ICD-10-CM

## 2014-01-18 LAB — LIPID PANEL
Cholesterol: 149 mg/dL (ref 0–200)
HDL: 28.4 mg/dL — ABNORMAL LOW (ref >39.00)
NonHDL: 120.6
Total CHOL/HDL Ratio: 5
Triglycerides: 398 mg/dL — ABNORMAL HIGH (ref 0.0–149.0)
VLDL: 79.6 mg/dL — ABNORMAL HIGH (ref 0.0–40.0)

## 2014-01-18 LAB — HEMOGLOBIN A1C: Hgb A1c MFr Bld: 9.3 % — ABNORMAL HIGH (ref 4.6–6.5)

## 2014-01-18 LAB — BASIC METABOLIC PANEL
BUN: 19 mg/dL (ref 6–23)
CO2: 27 mEq/L (ref 19–32)
Calcium: 9.8 mg/dL (ref 8.4–10.5)
Chloride: 103 mEq/L (ref 96–112)
Creatinine, Ser: 1.6 mg/dL — ABNORMAL HIGH (ref 0.4–1.5)
GFR: 44.47 mL/min — ABNORMAL LOW (ref >60.00)
Glucose, Bld: 136 mg/dL — ABNORMAL HIGH (ref 70–99)
Potassium: 5 mEq/L (ref 3.5–5.1)
Sodium: 137 mEq/L (ref 135–145)

## 2014-01-18 LAB — LDL CHOLESTEROL, DIRECT: Direct LDL: 74.6 mg/dL

## 2014-01-18 MED ORDER — INSULIN NPH (HUMAN) (ISOPHANE) 100 UNIT/ML ~~LOC~~ SUSP: SUBCUTANEOUS | Status: DC

## 2014-01-18 NOTE — Progress Notes (Signed)
Subjective:    Patient ID: Eric Choi, male    DOB: 1941-02-06, 73 y.o.   MRN: [id number]  HPI pt returns for f/u of insulin-requiring diabetes (dx'ed 1990, when he presented with near-syncope (glucose was 413); complicated by retinopathy and CAD; he has been on insulin since 2004; he has requested a simple, inexpensive insulin regimen; he has never had pancreatitis, severe hypoglycemia or DKA). no cbg record, but states cbg's vary from 60-200.  It is in general higher as the day goes on. pt states he feels well in general, except for fatigue.  He takes 150 units bid. Past Medical History  Diagnosis Date  . DIABETES MELLITUS, TYPE II 11/03/2006  . HYPERLIPIDEMIA 11/03/2006  . HYPERTENSION 11/03/2006  . CAD (coronary artery disease)     Past Surgical History  Procedure Laterality Date  . Angioplasty  20 years ago    percutaneous transluminal   . Cataract extraction  2002, 2005    bilateral  . Ptca      History   Social History  . Marital Status: Married    Spouse Name: N/A    Number of Children: N/A  . Years of Education: N/A   Occupational History  . Not on file.   Social History Main Topics  . Smoking status: Former Research scientist (life sciences)  . Smokeless tobacco: Never Used  . Alcohol Use: No  . Drug Use: No  . Sexual Activity: Not on file   Other Topics Concern  . Not on file   Social History Narrative   Works 3rd shift   Regular Exercise-yes    Current Outpatient Prescriptions on File Prior to Visit  Medication Sig Dispense Refill  . ACCU-CHEK AVIVA PLUS test strip Test blood glucose two  times daily as instructed  by physician  200 each  2  . amLODipine (NORVASC) 10 MG tablet TAKE ONE TABLET BY MOUTH DAILY  90 tablet  1  . aspirin 81 MG tablet Take 81 mg by mouth daily.        . Blood Glucose Monitoring Suppl (ACCU-CHEK AVIVA PLUS) W/DEVICE KIT Use to check blood sugars 2 times per day. Dx code 250.03  1 kit  0  . glucose blood (BAYER CONTOUR TEST) test strip Use as  directed to check blood sugar twice daily dx 250.03  200 each  3  . Lancets Misc. (ACCU-CHEK MULTICLIX LANCET DEV) KIT       . lisinopril (PRINIVIL,ZESTRIL) 20 MG tablet TAKE 1 TABLET (20 MG TOTAL) BY MOUTH DAILY.  90 tablet  3  . metoprolol (LOPRESSOR) 50 MG tablet TAKE ONE TABLET BY MOUTH DAILY  180 tablet  1  . NEEDLE, DISP, 30 G (B-D DISP NEEDLE 30GX1") 30G X 1" MISC by Does not apply route as directed.        . simvastatin (ZOCOR) 40 MG tablet TAKE 1 TABLET (40 MG TOTAL) BY MOUTH AT BEDTIME.  90 tablet  1   No current facility-administered medications on file prior to visit.    No Known Allergies  Family History  Problem Relation Age of Onset  . COPD Father   . Alcohol abuse Father   . Diabetes Father   . Cancer Sister     breast  . Colon cancer Neg Hx     BP 112/60  Pulse 84  Temp(Src) 97.6 F (36.4 C) (Oral)  Ht _0  (1.753 m)  Wt 247 lb (112.038 kg)  BMI 36.46 kg/m2  SpO2 94%  Review of  Systems Denies LOC and weight change    Objective:   Physical Exam VITAL SIGNS:  See vs page GENERAL: no distress Pulses: dorsalis pedis intact bilat.  Feet: no deformity. Trace bilat leg edema. There is bilateral onychomycosis and varicosities.   Skin: no ulcer on the feet. feet are of normal color and temp.  Neuro: sensation is intact to touch on the feet.    Lab Results  Component Value Date   HGBA1C 9.3* 01/18/2014       Assessment & Plan:  DM: severe exacerbation Noncompliance with cbg recording and insulin dosing: I'll work around this as best I can. CAD: in this setting, he should avoid hypoglycemia.    Patient is advised the following: Patient Instructions  blood tests are being requested for you today.  We'll contact you with results.   Please come back for a follow-up appointment in 3 months.   pending the test results, please change your nph insulin to 180 units in the morning, and 120 units in the evening.  however, if you are going to be very active, or  eating less than usual, take only 70 units.  For your safety, take these insulin amounts no matter what your blood sugar is.  If you are unable to anticipate the activity, eat a light snack with it.   On this type of insulin schedule, you should eat meals on a regular schedule.  If a meal is missed or significantly delayed, your blood sugar could go low. check your blood sugar 2 times a day.  vary the time of day when you check, between before the 3 meals, and at bedtime.  also check if you have symptoms of your blood sugar being too high or too low.  please keep a record of the readings and bring it to your next appointment here.  please call us sooner if you are having low blood sugar episodes.

## 2014-01-18 NOTE — Patient Instructions (Addendum)
blood tests are being requested for you today.  We'll contact you with results.   Please come back for a follow-up appointment in 3 months.   pending the test results, please change your nph insulin to 180 units in the morning, and 120 units in the evening.  however, if you are going to be very active, or eating less than usual, take only 70 units.  For your safety, take these insulin amounts no matter what your blood sugar is.  If you are unable to anticipate the activity, eat a light snack with it.   On this type of insulin schedule, you should eat meals on a regular schedule.  If a meal is missed or significantly delayed, your blood sugar could go low. check your blood sugar 2 times a day.  vary the time of day when you check, between before the 3 meals, and at bedtime.  also check if you have symptoms of your blood sugar being too high or too low.  please keep a record of the readings and bring it to your next appointment here.  please call us sooner if you are having low blood sugar episodes.

## 2014-02-01 ENCOUNTER — Other Ambulatory Visit (INDEPENDENT_AMBULATORY_CARE_PROVIDER_SITE_OTHER): Payer: Medicare Other

## 2014-02-01 DIAGNOSIS — E1129 Type 2 diabetes mellitus with other diabetic kidney complication: Secondary | ICD-10-CM

## 2014-02-01 DIAGNOSIS — E785 Hyperlipidemia, unspecified: Secondary | ICD-10-CM

## 2014-02-01 DIAGNOSIS — Z0000 Encounter for general adult medical examination without abnormal findings: Secondary | ICD-10-CM

## 2014-02-01 DIAGNOSIS — I10 Essential (primary) hypertension: Secondary | ICD-10-CM

## 2014-02-01 LAB — POCT URINALYSIS DIPSTICK
Bilirubin, UA: NEGATIVE
Clarity, UA: NEGATIVE
Glucose, UA: NEGATIVE
Ketones, UA: NEGATIVE
Leukocytes, UA: NEGATIVE
Nitrite, UA: NEGATIVE
Protein, UA: NEGATIVE
Spec Grav, UA: 1.015
Urobilinogen, UA: 1
pH, UA: 5

## 2014-02-01 LAB — CBC WITH DIFFERENTIAL/PLATELET
Basophils Absolute: 0.1 10*3/uL (ref 0.0–0.1)
Basophils Relative: 0.5 % (ref 0.0–3.0)
Eosinophils Absolute: 0.5 10*3/uL (ref 0.0–0.7)
Eosinophils Relative: 4.7 % (ref 0.0–5.0)
HCT: 44.7 % (ref 39.0–52.0)
Hemoglobin: 14.6 g/dL (ref 13.0–17.0)
Lymphocytes Relative: 27.4 % (ref 12.0–46.0)
Lymphs Abs: 3.1 10*3/uL (ref 0.7–4.0)
MCHC: 32.6 g/dL (ref 30.0–36.0)
MCV: 89.7 fl (ref 78.0–100.0)
Monocytes Absolute: 1.3 10*3/uL — ABNORMAL HIGH (ref 0.1–1.0)
Monocytes Relative: 11.3 % (ref 3.0–12.0)
Neutro Abs: 6.4 10*3/uL (ref 1.4–7.7)
Neutrophils Relative %: 56.1 % (ref 43.0–77.0)
Platelets: 276 10*3/uL (ref 150.0–400.0)
RBC: 4.98 Mil/uL (ref 4.22–5.81)
RDW: 15.2 % (ref 11.5–15.5)
WBC: 11.4 10*3/uL — ABNORMAL HIGH (ref 4.0–10.5)

## 2014-02-01 LAB — BASIC METABOLIC PANEL
BUN: 17 mg/dL (ref 6–23)
CO2: 26 mEq/L (ref 19–32)
Calcium: 9.7 mg/dL (ref 8.4–10.5)
Chloride: 105 mEq/L (ref 96–112)
Creatinine, Ser: 1.5 mg/dL (ref 0.4–1.5)
GFR: 47.84 mL/min — ABNORMAL LOW (ref >60.00)
Glucose, Bld: 100 mg/dL — ABNORMAL HIGH (ref 70–99)
Potassium: 5 mEq/L (ref 3.5–5.1)
Sodium: 141 mEq/L (ref 135–145)

## 2014-02-01 LAB — LIPID PANEL
Cholesterol: 141 mg/dL (ref 0–200)
HDL: 27.9 mg/dL — ABNORMAL LOW (ref >39.00)
NonHDL: 113.1
Total CHOL/HDL Ratio: 5
Triglycerides: 260 mg/dL — ABNORMAL HIGH (ref 0.0–149.0)
VLDL: 52 mg/dL — ABNORMAL HIGH (ref 0.0–40.0)

## 2014-02-01 LAB — HEPATIC FUNCTION PANEL
ALT: 31 U/L (ref 0–53)
AST: 28 U/L (ref 0–37)
Albumin: 4.1 g/dL (ref 3.5–5.2)
Alkaline Phosphatase: 54 U/L (ref 39–117)
Bilirubin, Direct: 0.1 mg/dL (ref 0.0–0.3)
Total Bilirubin: 0.5 mg/dL (ref 0.2–1.2)
Total Protein: 7.3 g/dL (ref 6.0–8.3)

## 2014-02-01 LAB — LDL CHOLESTEROL, DIRECT: Direct LDL: 77.5 mg/dL

## 2014-02-01 LAB — HEMOGLOBIN A1C: Hgb A1c MFr Bld: 9 % — ABNORMAL HIGH (ref 4.6–6.5)

## 2014-02-01 LAB — PSA: PSA: 0.77 ng/mL (ref 0.10–4.00)

## 2014-02-01 LAB — MICROALBUMIN / CREATININE URINE RATIO
Creatinine,U: 111.9 mg/dL
Microalb Creat Ratio: 6.8 mg/g (ref 0.0–30.0)
Microalb, Ur: 7.6 mg/dL — ABNORMAL HIGH (ref 0.0–1.9)

## 2014-02-01 LAB — TSH: TSH: 4.04 u[IU]/mL (ref 0.35–4.50)

## 2014-02-11 ENCOUNTER — Ambulatory Visit (INDEPENDENT_AMBULATORY_CARE_PROVIDER_SITE_OTHER): Payer: Medicare Other | Admitting: Family Medicine

## 2014-02-11 ENCOUNTER — Encounter: Payer: Self-pay | Admitting: Family Medicine

## 2014-02-11 VITALS — BP 130/80 | Temp 97.5°F | Ht 68.25 in | Wt 245.0 lb

## 2014-02-11 DIAGNOSIS — E1039 Type 1 diabetes mellitus with other diabetic ophthalmic complication: Secondary | ICD-10-CM

## 2014-02-11 DIAGNOSIS — E785 Hyperlipidemia, unspecified: Secondary | ICD-10-CM

## 2014-02-11 DIAGNOSIS — I10 Essential (primary) hypertension: Secondary | ICD-10-CM

## 2014-02-11 MED ORDER — METOPROLOL TARTRATE 50 MG PO TABS: ORAL_TABLET | ORAL | Status: DC

## 2014-02-11 MED ORDER — AMLODIPINE BESYLATE 10 MG PO TABS: ORAL_TABLET | ORAL | Status: DC

## 2014-02-11 MED ORDER — SIMVASTATIN 40 MG PO TABS: ORAL_TABLET | ORAL | Status: DC

## 2014-02-11 MED ORDER — LISINOPRIL 20 MG PO TABS: ORAL_TABLET | ORAL | Status: DC

## 2014-02-11 NOTE — Patient Instructions (Signed)
Continue your current medications  We will get you set up a second opinion with Dr. Altha Harm gheghe  Followup in one year or sooner for any problems

## 2014-02-11 NOTE — Progress Notes (Signed)
Pre visit review using our clinic review tool, if applicable. No additional management support is needed unless otherwise documented below in the visit note. Lab Results  Component Value Date   HGBA1C 9.0* 02/01/2014   HGBA1C 9.3* 01/18/2014   HGBA1C 9.4* 10/19/2013   Lab Results  Component Value Date   MICROALBUR 7.6* 02/01/2014   CREATININE 1.5 02/01/2014

## 2014-02-11 NOTE — Progress Notes (Signed)
   Subjective:    Patient ID: Eric Choi, male    DOB: 1940/07/15, 73 y.o.   MRN: [id number]  HPI Eric Choi is a 73 year old male who comes in today for evaluation of hypertension and hyperlipidemia  He has diabetes type 1 and is followed by Dr. Bryson Ha and endocrinology. A1c is 9. Fasting blood sugar week ago was 100. He's at 250 units of insulin twice daily  I wonder if you might benefit from a consult with Jettie Booze??????????  He gets routine eye care, no dental care he says he has an upper dental plate and a cousin who is a Pharmacist, community. As noted his teeth checked yearly  He had a colonoscopy recently showed one polyp he is due for followup    Review of Systems  Constitutional: Negative.   HENT: Negative.   Eyes: Negative.   Respiratory: Negative.   Cardiovascular: Negative.   Gastrointestinal: Negative.   Genitourinary: Negative.   Musculoskeletal: Negative.   Skin: Negative.   Neurological: Negative.   Psychiatric/Behavioral: Negative.        Objective:   Physical Exam  Nursing note and vitals reviewed. Constitutional: He is oriented to person, place, and time. He appears well-developed and well-nourished.  HENT:  Head: Normocephalic and atraumatic.  Right Ear: External ear normal.  Left Ear: External ear normal.  Nose: Nose normal.  Mouth/Throat: Oropharynx is clear and moist.  Eyes: Conjunctivae and EOM are normal. Pupils are equal, round, and reactive to light.  Neck: Normal range of motion. Neck supple. No JVD present. No tracheal deviation present. No thyromegaly present.  Cardiovascular: Normal rate, regular rhythm, normal heart sounds and intact distal pulses.  Exam reveals no gallop and no friction rub.   No murmur heard. No carotid neurologic bruits peripheral pulses 2+ and symmetrical  No neuropathy  Pulmonary/Chest: Effort normal and breath sounds normal. No stridor. No respiratory distress. He has no wheezes. He has no rales. He exhibits no tenderness.   Abdominal: Soft. Bowel sounds are normal. He exhibits no distension and no mass. There is no tenderness. There is no rebound and no guarding.  Genitourinary: Rectum normal and penis normal. Guaiac negative stool. No penile tenderness.  1+ symmetrical BPH  Musculoskeletal: Normal range of motion. He exhibits no edema and no tenderness.  Lymphadenopathy:    He has no cervical adenopathy.  Neurological: He is alert and oriented to person, place, and time. He has normal reflexes. No cranial nerve deficit. He exhibits normal muscle tone.  Skin: Skin is warm and dry. No rash noted. No erythema. No pallor.  Total body skin exam normal except for 2 lesions of concern......Marland Kitchen #1 left forehead.......Marland Kitchen #2 right cheek they've been increasing in size and have some abnormal pigmentation,,,,,,,,,, advised to return for removal..  Psychiatric: He has a normal mood and affect. His behavior is normal. Judgment and thought content normal.          Assessment & Plan:  Healthy male  Diabetes type 1.......Marland Kitchen on 300 units of insulin daily........Marland Kitchen A1c 9.0%........ patient expresses her frustration of not having his blood sugar normal....... second opinion with Dr. Mady Gemma  Hypertension at goal continue current therapy  Hyperlipidemia LDL 77........... continue current statin.

## 2014-02-19 ENCOUNTER — Encounter: Payer: Self-pay | Admitting: Internal Medicine

## 2014-02-19 ENCOUNTER — Ambulatory Visit (INDEPENDENT_AMBULATORY_CARE_PROVIDER_SITE_OTHER): Payer: Medicare Other | Admitting: Internal Medicine

## 2014-02-19 VITALS — BP 118/62 | HR 78 | Temp 97.7°F | Resp 12 | Wt 246.0 lb

## 2014-02-19 DIAGNOSIS — E1065 Type 1 diabetes mellitus with hyperglycemia: Secondary | ICD-10-CM

## 2014-02-19 DIAGNOSIS — E1121 Type 2 diabetes mellitus with diabetic nephropathy: Secondary | ICD-10-CM

## 2014-02-19 DIAGNOSIS — E1165 Type 2 diabetes mellitus with hyperglycemia: Secondary | ICD-10-CM

## 2014-02-19 DIAGNOSIS — IMO0002 Reserved for concepts with insufficient information to code with codable children: Secondary | ICD-10-CM

## 2014-02-19 DIAGNOSIS — E1129 Type 2 diabetes mellitus with other diabetic kidney complication: Secondary | ICD-10-CM

## 2014-02-19 MED ORDER — INSULIN NPH (HUMAN) (ISOPHANE) 100 UNIT/ML ~~LOC~~ SUSP: 80.0000 [IU] | Freq: Two times a day (BID) | SUBCUTANEOUS | Status: DC

## 2014-02-19 MED ORDER — INSULIN REGULAR HUMAN 100 UNIT/ML IJ SOLN: 20.0000 [IU] | Freq: Two times a day (BID) | INTRAMUSCULAR | Status: DC

## 2014-02-19 NOTE — Patient Instructions (Addendum)
Please decrease the NPH dose and start Regular insulin as follows:  Insulin Before breakfast Before lunch Before dinner  Regular 20  20  NPH 80  80  Please inject the insulin 30 min before meals.  Please let me know if the sugars are consistently <80 or >200.  When injecting insulin:  Inject in the abdomen  Rotate the injection sites around the belly button  Change needle for each injection  Keep needle in for 10 sec after last unit of insulin in  Keep the insulin in use out of the fridge  Please return in 1 month with your sugar log.

## 2014-02-19 NOTE — Progress Notes (Signed)
Patient ID: Eric Choi, male   DOB: Sep 15, 1940, 73 y.o.   MRN: [id number]  HPI: Eric Choi is a 73 y.o.-year-old male, referred by his PCP, Eric Choi, for management of DM2, insulin-dependent, uncontrolled, with complications (CAD, CKD, PN). He was seeing Eric Choi before, is here for a second opinion, as referred by PCP. He is here with his wife, who offers part of the history.  Patient has been diagnosed with diabetes in 1990; he started on insulin in 2005.  Last hemoglobin A1c was: Lab Results  Component Value Date   HGBA1C 9.0* 02/01/2014   HGBA1C 9.3* 01/18/2014   HGBA1C 9.4* 10/19/2013   Pt is on: - NPH vial 160 units in am and 140 units before dinner He was on Metformin, but has CKD.   Pt checks his sugars 3-6x a day and they are: - am: 90-200 - 1-2h after b'fast: 150s - before lunch: 60-100 if he does not eat a midmorning snack - 2h after lunch: n/c - before dinner: 150s - 1-2h after dinner: 120-125 - bedtime: <120 - nighttime: can be 60-70 >> crackers and milk Has occasional. Lowest sugar was 60 or 70s; he has hypoglycemia awareness at 90.  Highest sugar was >300s.   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  - Pt has CKD, last BUN/creatinine:  Lab Results  Component Value Date   BUN 17 02/01/2014   CREATININE 1.5 02/01/2014  He is on Lisinopril. - last set of lipids: Lab Results  Component Value Date   CHOL 141 02/01/2014   HDL 27.90* 02/01/2014   LDLDIRECT 77.5 02/01/2014   TRIG 260.0* 02/01/2014   CHOLHDL 5 02/01/2014  He is on Zocor. - last eye exam was in 11/2013. ?Eric.  Had cataracts. - + numbness and tingling in his feet.  I reviewed his chart and he also has a history of HTN, HL. Last TSH: Lab Results  Component Value Date   TSH 4.04 02/01/2014   Pt has FH of  DM in father, MGF, PGM.  ROS: Constitutional: no weight gain/loss, + fatigue, no subjective hyperthermia/hypothermia, + nocturia Eyes: no blurry vision, no xerophthalmia ENT: no sore throat, no nodules palpated in throat, no dysphagia/odynophagia, no hoarseness, + hypoacusis Cardiovascular: no CP/+ SOB/no palpitations/leg swelling Respiratory: no cough/+ SOB Gastrointestinal: no N/V/D/C Musculoskeletal: + both: muscle/joint aches Skin: no rashes, + itching Neurological: no tremors/numbness/tingling/dizziness Psychiatric: no depression/anxiety  Past Medical History  Diagnosis Date  . DIABETES MELLITUS, TYPE II 11/03/2006  . HYPERLIPIDEMIA 11/03/2006  . HYPERTENSION 11/03/2006  . CAD (coronary artery disease)    Past Surgical History  Procedure Laterality Date  . Angioplasty  20 years ago    percutaneous transluminal   . Cataract extraction  2002, 2005    bilateral  . Ptca     History   Social History  . Marital Status: Married    Spouse Name: N/A    Number of Children: 2   Occupational History  . 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 Smoking status: Former Research scientist (life sciences), quit in 1989  . Smokeless tobacco: Never Used  . Alcohol Use: No  . Drug Use: No   Social History Narrative   Works 3rd shift   Regular Exercise-yes   Current Outpatient Prescriptions on File Prior to  Visit  Medication Sig Dispense Refill  . ACCU-CHEK AVIVA PLUS test strip Test blood glucose two  times daily as instructed  by physician  200 each  2  . amLODipine (NORVASC) 10 MG tablet TAKE ONE TABLET BY MOUTH DAILY  90 tablet  3  . aspirin 81 MG tablet Take 81 mg by mouth daily.        . Blood Glucose Monitoring Suppl (ACCU-CHEK AVIVA PLUS) W/DEVICE KIT Use to check blood sugars 2 times per day. Dx code 250.03  1 kit  0  . glucose blood (BAYER CONTOUR TEST) test strip Use as directed to check blood sugar twice daily dx 250.03  200 each  3  . Lancets Misc. (ACCU-CHEK MULTICLIX LANCET DEV)  KIT       . lisinopril (PRINIVIL,ZESTRIL) 20 MG tablet TAKE 1 TABLET (20 MG TOTAL) BY MOUTH DAILY.  90 tablet  3  . metoprolol (LOPRESSOR) 50 MG tablet TAKE ONE TABLET BY MOUTH DAILY  180 tablet  3  . NEEDLE, DISP, 30 G (B-D DISP NEEDLE 30GX1") 30G X 1" MISC by Does not apply route as directed.        . simvastatin (ZOCOR) 40 MG tablet TAKE 1 TABLET (40 MG TOTAL) BY MOUTH AT BEDTIME.  90 tablet  3   No current facility-administered medications on file prior to visit.   No Known Allergies Family History  Problem Relation Age of Onset  . COPD Father   . Alcohol abuse Father   . Diabetes Father   . Cancer Sister     breast  . Colon cancer Neg Hx    PE: BP 118/62  Pulse 78  Temp(Src) 97.7 F (36.5 C) (Oral)  Resp 12  Wt 246 lb (111.585 kg)  SpO2 95% Wt Readings from Last 3 Encounters:  02/19/14 246 lb (111.585 kg)  02/11/14 245 lb (111.131 kg)  01/18/14 247 lb (112.038 kg)   Constitutional: overweight, in NAD Eyes: PERRLA, EOMI, no exophthalmos ENT: moist mucous membranes, no thyromegaly, no cervical lymphadenopathy Cardiovascular: RRR, No MRG Respiratory: CTA B Gastrointestinal: abdomen soft, NT, ND, BS+ Musculoskeletal: no deformities, strength intact in all 4 Skin: moist, warm, no rashes Neurological: no tremor with outstretched hands, DTR normal in all 4  ASSESSMENT: 1. DM1, uncontrolled, with complications - CAD - CKD - PN  PLAN:  1. Patient with long-standing, uncontrolled DM2, on insulin therapy, on a large dose of intermediate acting insulin, with subsequent many low CBGs (needs to eat often to keep sugars up).  - We discussed about changes to his insulin regimen, as follows:  Patient Instructions   Please decrease the NPH dose and start Regular insulin as follows:  Insulin Before breakfast Before lunch Before dinner  Regular 20  20  NPH 80  80  Please inject the insulin 30 min before meals.  Please let me know if the sugars are consistently <80 or  >200.  When injecting insulin:  Inject in the abdomen  Rotate the injection sites around the belly button  Change needle for each injection  Keep needle in for 10 sec after last unit of insulin in  Keep the insulin in use out of the fridge  Please return in 1 month with your sugar log.   - pt was given written instructions about mixing the insulins - Strongly advised him to start checking sugars at different times of the day - check 4 times a day, rotating checks - given sugar log and advised how to  fill it and to bring it at next appt  - given foot care handout and explained the principles  - given instructions for hypoglycemia management "15-15 rule"  - advised for yearly eye exams Return in about 1 month (around 03/22/2014).  - time spent with the patient: 40 min, of which >50% was spent in reviewing his sugar levels, his previous diabetes treatments, his eating habits, discussing hypo- and hyper-glycemic episodes, reviewing her previous labs and developing a plan to avoid hypo- an hyper-glycemia. Pt was also taught how to inject insulin correctly and also how to mix the the insulins.

## 2014-02-22 ENCOUNTER — Telehealth: Payer: Self-pay | Admitting: *Deleted

## 2014-02-22 NOTE — Telephone Encounter (Signed)
Called pt and lvm advising him per Dr Arman Filter note. Advised to call back with any questions.

## 2014-02-22 NOTE — Telephone Encounter (Signed)
Please increase Regular insulin as follows:  Insulin  Before breakfast  Before lunch  Before dinner   Regular  20 >> 30  20 >> 30  NPH  80   80   Please let me know if the sugars are consistently <80 or >200.

## 2014-02-22 NOTE — Telephone Encounter (Signed)
Returned call to pt's wife. She stated that pt's bg has been running high. Thurs am it was 147, 2 hrs after breakfast 293 then it dropped down to 176. 2 hrs after lunch it was 275. Last night at 10:30 it was 230. Fast glucose this am was 250. Please advise.

## 2014-02-25 ENCOUNTER — Ambulatory Visit (INDEPENDENT_AMBULATORY_CARE_PROVIDER_SITE_OTHER): Payer: Medicare Other | Admitting: Family Medicine

## 2014-02-25 ENCOUNTER — Encounter: Payer: Self-pay | Admitting: Family Medicine

## 2014-02-25 VITALS — BP 134/64 | Temp 98.0°F | Ht 68.25 in | Wt 244.8 lb

## 2014-02-25 DIAGNOSIS — D239 Other benign neoplasm of skin, unspecified: Secondary | ICD-10-CM

## 2014-02-25 NOTE — Patient Instructions (Signed)
Remove the Band-Aids tomorrow  Return when necessary

## 2014-02-25 NOTE — Progress Notes (Signed)
Pre visit review using our clinic review tool, if applicable. No additional management support is needed unless otherwise documented below in the visit note. 

## 2014-02-25 NOTE — Addendum Note (Signed)
Addended by: Townsend Roger D on: 02/25/2014 01:10 PM   Modules accepted: Orders

## 2014-02-25 NOTE — Progress Notes (Signed)
   Subjective:    Patient ID: Eric Choi, male    DOB: 1940/09/08, 73 y.o.   MRN: [id number]  HPI [name] is a 73 year old male who comes in today for removal of 3 lesions  Lesion #1 is 6 mm x 6 mm the right side of his face enlarging dark pigment  Lesion #2 6 x 6 mm below #1 elevated crusty with dark pigmentation  Lesion #3 15 mm x 15 mm left temple lesion has been increasing in size it has dark pigmentation in it. He has light skin and light eyes and a lot of sun exposure in the past. No history of skin cancer   Review of Systems Review of systems otherwise negative    Objective:   Physical Exam Well-developed well-nourished male no acute distress vital signs stable he is afebrile all 3 lesions were anesthetized with 1% Xylocaine with epinephrine and removed with 2 mm margins. The bases were cauterized all 3 lesions were sent for pathologic analysis.  Clinically they appear to be dysplastic nevi inflamed path pending       Assessment & Plan:  Dysplastic nevi past pending

## 2014-03-01 ENCOUNTER — Other Ambulatory Visit: Payer: Self-pay | Admitting: Family Medicine

## 2014-03-25 ENCOUNTER — Encounter: Payer: Self-pay | Admitting: Internal Medicine

## 2014-03-25 ENCOUNTER — Telehealth: Payer: Self-pay | Admitting: Internal Medicine

## 2014-03-25 ENCOUNTER — Ambulatory Visit (INDEPENDENT_AMBULATORY_CARE_PROVIDER_SITE_OTHER): Payer: Medicare Other | Admitting: Internal Medicine

## 2014-03-25 VITALS — BP 122/58 | HR 86 | Temp 97.8°F | Resp 12 | Wt 239.0 lb

## 2014-03-25 DIAGNOSIS — E1129 Type 2 diabetes mellitus with other diabetic kidney complication: Secondary | ICD-10-CM

## 2014-03-25 DIAGNOSIS — E1121 Type 2 diabetes mellitus with diabetic nephropathy: Secondary | ICD-10-CM

## 2014-03-25 DIAGNOSIS — IMO0002 Reserved for concepts with insufficient information to code with codable children: Secondary | ICD-10-CM

## 2014-03-25 DIAGNOSIS — E1165 Type 2 diabetes mellitus with hyperglycemia: Secondary | ICD-10-CM

## 2014-03-25 LAB — HEMOGLOBIN A1C: Hgb A1c MFr Bld: 7.7 % — ABNORMAL HIGH (ref 4.6–6.5)

## 2014-03-25 MED ORDER — INSULIN REGULAR HUMAN 100 UNIT/ML IJ SOLN
15.0000 [IU] | Freq: Two times a day (BID) | INTRAMUSCULAR | Status: DC
Start: 2014-03-25 — End: 2014-03-25

## 2014-03-25 MED ORDER — INSULIN REGULAR HUMAN 100 UNIT/ML IJ SOLN: INTRAMUSCULAR | Status: DC

## 2014-03-25 NOTE — Telephone Encounter (Signed)
Patient wife stated that patient need Novolin called in to [facility] on [address], he need it today.

## 2014-03-25 NOTE — Telephone Encounter (Signed)
Called pt's wife. She said that ins will not cover Humulin R until after the first of the year. Ok to send Novolin R in to pt's pharmacy? Please advise.

## 2014-03-25 NOTE — Patient Instructions (Signed)
Please change the insulin doses as follows: Insulin Before breakfast Before lunch Before dinner  Regular 25 with a smaller meal 30 with a regular meal 35 with a larger meal 15 units 25 with a smaller meal 30 with a regular meal 35 with a larger meal  NPH 80  80 >> 65   Please do not take the insulin >30 min before a meal. Please return in 1.5 month with your sugar log.  Please let me know if the sugars are consistently <80 or >200. Please stop at the lab.

## 2014-03-25 NOTE — Telephone Encounter (Signed)
Yes, ok 

## 2014-03-25 NOTE — Progress Notes (Signed)
Patient ID: Eric Choi, male   DOB: 03/30/1941, 73 y.o.   MRN: [id number]  HPI: Eric Choi is a 73 y.o.-year-old male, returning for f/u for DM2 dx 1990, insulin-dependent since 2005, uncontrolled, with complications (CAD, CKD, PN). He is here with his wife, who offers part of the history.  Last hemoglobin A1c was: Lab Results  Component Value Date   HGBA1C 9.0* 02/01/2014   HGBA1C 9.3* 01/18/2014   HGBA1C 9.4* 10/19/2013   Pt was on: - NPH vial 160 units in am and 140 units before dinner He was on Metformin, but has CKD.   At last visit, we switched to: Insulin Before breakfast Before lunch Before dinner  Regular 20 >> 30  20 >> 30  NPH 80  80    Pt checks his sugars 3-6x a day and they are fluctuating, with a few lows a night and in am: - am: 90-200 >> 70-228 - 1-2h after b'fast: 150s >> 118-180 - before lunch: 60-100 if he does not eat a midmorning snack >> 81-180 - 2h after lunch: n/c >> 94, 137-221 - before dinner: 150s >> 94, 101-230 - 1-2h after dinner: 120-125 >> 98-204 - bedtime: <120 - nighttime: can be 60-70 >> crackers and milk >> 53, 81, 161, 172 Has occasional. Lowest sugar was 53; he has hypoglycemia awareness at 90.  Highest sugar was >300s >> 200s.   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  - Pt has CKD, last BUN/creatinine:  Lab Results  Component Value Date   BUN 17 02/01/2014   CREATININE 1.5 02/01/2014  He is on Lisinopril. - last set of lipids: Lab Results  Component Value Date   CHOL 141 02/01/2014   HDL 27.90* 02/01/2014   LDLDIRECT 77.5 02/01/2014   TRIG 260.0* 02/01/2014   CHOLHDL 5 02/01/2014  He is on Zocor. - last eye exam was in 11/2013. ?DR. Dr Katy Fitch. Had cataracts.  - + numbness and tingling in his feet.  I reviewed his chart and  he also has a history of HTN, HL. Last TSH: Lab Results  Component Value Date   TSH 4.04 02/01/2014   I reviewed pt's medications, allergies, PMH, social hx, family hx and no changes required, except as mentioned above.  ROS: Constitutional: no weight gain/loss, + fatigue, no subjective hyperthermia/hypothermia, + nocturia Eyes: no blurry vision, no xerophthalmia ENT: no sore throat, no nodules palpated in throat, no dysphagia/odynophagia, no hoarseness, + hypoacusis Cardiovascular: no CP/+ SOB/no palpitations/leg swelling Respiratory: no cough/+ SOB Gastrointestinal: no N/V/D/C Musculoskeletal: + both: muscle/joint aches Skin: no rashes, + itching Neurological: no tremors/numbness/tingling/dizziness  PE: BP 122/58 mmHg  Pulse 86  Temp(Src) 97.8 F (36.6 C) (Oral)  Resp 12  Wt 239 lb (108.41 kg)  SpO2 97% Wt Readings from Last 3 Encounters:  03/25/14 239 lb (108.41 kg)  02/25/14 244 lb 12.8 oz (111.041 kg)  02/19/14 246 lb (111.585 kg)   Constitutional: overweight, in NAD Eyes: PERRLA, EOMI, no exophthalmos ENT: moist mucous membranes, no thyromegaly, no cervical lymphadenopathy Cardiovascular: RRR, No MRG Respiratory: CTA B Gastrointestinal: abdomen soft, NT, ND, BS+ Musculoskeletal: no deformities, strength intact in all 4 Skin: moist, warm, no rashes Neurological: no tremor with outstretched hands, DTR normal in all 4  ASSESSMENT: 1. DM1, uncontrolled, with complications - CAD -  CKD - PN  PLAN:  1. Patient with long-standing, uncontrolled DM2, on basal-bolus insulin therapy (short acting insulin added at last visit). Sugars fluctuating, but he has lows at night >> sugars low-high in am, and some higher sugars after lunch and before dinner. We will need to add insulin with lunch and decrease his NPH before dinner. If still lows at night >> will need to move the NPH at bedtime. - I advised him to:  Patient Instructions  Please change the insulin doses as  follows: Insulin Before breakfast Before lunch Before dinner  Regular 25 with a smaller meal 30 with a regular meal 35 with a larger meal 15 units 25 with a smaller meal 30 with a regular meal 35 with a larger meal  NPH 80  80 >> 65   Please do not take the insulin >30 min before a meal. Please return in 1.5 month with your sugar log.  Please let me know if the sugars are consistently <80 or >200. Please stop at the lab.  - continue checking sugars at different times of the day - check 4 times a day, rotating checks - advised for yearly eye exams - he is UTD - he had PNA and flu shot - will check A1c today - refilled Insulin R Return in about 6 weeks (around 05/06/2014).  Office Visit on 03/25/2014  Component Date Value Ref Range Status  . Hgb A1c MFr Bld 03/25/2014 7.7* 4.6 - 6.5 % Final   Glycemic Control Guidelines for People with Diabetes:Non Diabetic:  <6%Goal of Therapy: <7%Additional Action Suggested:  >8%   A1c improved!

## 2014-04-02 ENCOUNTER — Telehealth: Payer: Self-pay | Admitting: Internal Medicine

## 2014-04-02 ENCOUNTER — Other Ambulatory Visit: Payer: Self-pay | Admitting: *Deleted

## 2014-04-02 MED ORDER — GLUCOSE BLOOD VI STRP: ORAL_STRIP | Status: DC

## 2014-04-02 NOTE — Telephone Encounter (Signed)
Patient need new prescription for Accu check strips for [employer]iva plus meter

## 2014-04-19 ENCOUNTER — Ambulatory Visit: Payer: Medicare Other | Admitting: Endocrinology

## 2014-05-07 ENCOUNTER — Ambulatory Visit: Payer: Medicare Other | Admitting: Internal Medicine

## 2014-05-08 ENCOUNTER — Other Ambulatory Visit: Payer: Self-pay | Admitting: Family Medicine

## 2014-05-09 ENCOUNTER — Encounter: Payer: Self-pay | Admitting: Internal Medicine

## 2014-05-09 ENCOUNTER — Ambulatory Visit (INDEPENDENT_AMBULATORY_CARE_PROVIDER_SITE_OTHER): Payer: Medicare Other | Admitting: Internal Medicine

## 2014-05-09 VITALS — BP 124/68 | HR 85 | Temp 97.5°F | Resp 12 | Wt 237.8 lb

## 2014-05-09 DIAGNOSIS — IMO0002 Reserved for concepts with insufficient information to code with codable children: Secondary | ICD-10-CM

## 2014-05-09 DIAGNOSIS — E1129 Type 2 diabetes mellitus with other diabetic kidney complication: Secondary | ICD-10-CM

## 2014-05-09 DIAGNOSIS — E1165 Type 2 diabetes mellitus with hyperglycemia: Secondary | ICD-10-CM

## 2014-05-09 DIAGNOSIS — E1121 Type 2 diabetes mellitus with diabetic nephropathy: Secondary | ICD-10-CM

## 2014-05-09 NOTE — Progress Notes (Signed)
Patient ID: Eric Choi, male   DOB: January 09, 1941, 73 y.o.   MRN: [id number]  HPI: Eric Choi is a 73 y.o.-year-old male, returning for f/u for DM2 dx 1990, insulin-dependent since 2005, uncontrolled, with complications (CAD, CKD, PN). He is here with his wife, who offers part of the history.  Last hemoglobin A1c was: Lab Results  Component Value Date   HGBA1C 7.7* 03/25/2014   HGBA1C 9.0* 02/01/2014   HGBA1C 9.3* 01/18/2014   Pt was on: - NPH vial 160 units in am and 140 units before dinner He was on Metformin, but has CKD.   At last visit, we switched to insulin N and R:  Insulin Before breakfast Before lunch Before dinner  Regular 25 with a smaller meal 30 with a regular meal 35 with a larger meal 15 units - not taking now 25 with a smaller meal 30 with a regular meal 35 with a larger meal  NPH 80 >> 75  80 >> 65   Pt checks his sugars 3-6x a day and they are fluctuating, with a few lows a night and in am: - am: 90-200 >> 70-228 >> 60, 97-195, 211 - 1-2h after b'fast: 150s >> 118-180 >> 128-209 - before lunch: 60-100 if he does not eat a midmorning snack >> 81-180 >> 64-157 - 2h after lunch: n/c >> 94, 137-221 >> 94-287 (he does not take lunch insulin) - before dinner: 150s >> 94, 101-230 >> 108-211 - 1-2h after dinner: 120-125 >> 98-204 >> 93-251 - bedtime: <120 >> n/c - nighttime: can be 60-70 >> crackers and milk >> 53, 81, 161, 172 >> 66-203 Has occasional. Lowest sugar was 60; he has hypoglycemia awareness at 90.  Highest sugar was >300s >> 200s.   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  - Pt has CKD, last BUN/creatinine:  Lab Results  Component Value Date   BUN 17 02/01/2014   CREATININE 1.5 02/01/2014  He is on Lisinopril. - last set of lipids: Lab Results   Component Value Date   CHOL 141 02/01/2014   HDL 27.90* 02/01/2014   LDLDIRECT 77.5 02/01/2014   TRIG 260.0* 02/01/2014   CHOLHDL 5 02/01/2014  He is on Zocor. - last eye exam was in 11/2013. ?DR. Dr Katy Fitch. Had cataracts.  - + numbness and tingling in his feet.  I reviewed his chart and he also has a history of HTN, HL. Last TSH: Lab Results  Component Value Date   TSH 4.04 02/01/2014   I reviewed pt's medications, allergies, PMH, social hx, family hx, and changes were documented in the history of present illness. Otherwise, unchanged from my initial visit note.  ROS: Constitutional: no weight gain/loss, no fatigue, no subjective hyperthermia/hypothermia Eyes: no blurry vision, no xerophthalmia ENT: no sore throat, no nodules palpated in throat, no dysphagia/odynophagia, no hoarseness Cardiovascular: no CP/SOB/palpitations/leg swelling Respiratory: no cough/SOB Gastrointestinal: no N/V/D/C Musculoskeletal: no muscle/joint aches Skin: no rashes Neurological: no tremors/numbness/tingling/dizziness  PE: BP 124/68 mmHg  Pulse 85  Temp(Src) 97.5 F (36.4 C) (Oral)  Resp 12  Wt 237 lb 12.8 oz (107.865 kg)  SpO2 96% Wt Readings from Last 3 Encounters:  05/09/14 237 lb 12.8 oz (107.865 kg)  03/25/14 239 lb (108.41 kg)  02/25/14 244 lb 12.8 oz (111.041 kg)  Constitutional: overweight, in NAD Eyes: PERRLA, EOMI, no exophthalmos ENT: moist mucous membranes, no thyromegaly, no cervical lymphadenopathy Cardiovascular: RRR, No MRG Respiratory: CTA B Gastrointestinal: abdomen soft, NT, ND, BS+ Musculoskeletal: no deformities, strength intact in all 4 Skin: moist, warm, no rashes Neurological: no tremor with outstretched hands, DTR normal in all 4  ASSESSMENT: 1. DM1, uncontrolled, with complications - CAD - CKD - PN  PLAN:  1. Patient with long-standing, uncontrolled DM2, on basal-bolus insulin therapy. Sugars fluctuating, but less lows at night after we decreased the  NPH at night. He still has higher sugars after lunch and before dinner as he is not taking insulin with lunch >> advised him to restart. He is taking 35 units for dinner every night, regardless of the size of the meal >> advised to decrease the dose for smaller meals >> e.g. 25 units for a sandwich. I underlined the doses that he should take more often. We will decrease his evening NPH a little, too. - I advised him to:  Patient Instructions  Please change the insulin doses as follows: Insulin Before breakfast Before lunch Before dinner  Regular 25 with a smaller meal 30 with a regular meal 35 with a larger meal 15 units 25 with a smaller meal 30 with a regular meal 35 with a larger meal  NPH 75  65 >> 60   Please do not take the insulin >30 min before a meal. Please return in 2 months with your sugar log.  Please let me know if the sugars are consistently <80 or >200.  - continue checking sugars at different times of the day - check 4 times a day, rotating checks - advised for yearly eye exams - he is UTD - he had PNA and flu shot this season -Return in about 2 months (around 07/10/2014).

## 2014-05-09 NOTE — Patient Instructions (Signed)
  Patient Instructions  Please change the insulin doses as follows: Insulin Before breakfast Before lunch Before dinner  Regular 25 with a smaller meal 30 with a regular meal 35 with a larger meal 15 units 25 with a smaller meal 30 with a regular meal 35 with a larger meal  NPH 75  65 >> 60   Please do not take the insulin >30 min before a meal. Please return in 2 months with your sugar log.  Please let me know if the sugars are consistently <80 or >200.

## 2014-05-20 DIAGNOSIS — E11329 Type 2 diabetes mellitus with mild nonproliferative diabetic retinopathy without macular edema: Secondary | ICD-10-CM | POA: Diagnosis not present

## 2014-06-05 ENCOUNTER — Telehealth: Payer: Self-pay | Admitting: Internal Medicine

## 2014-06-05 NOTE — Telephone Encounter (Signed)
error 

## 2014-07-12 ENCOUNTER — Ambulatory Visit (INDEPENDENT_AMBULATORY_CARE_PROVIDER_SITE_OTHER): Payer: Medicare Other | Admitting: Internal Medicine

## 2014-07-12 ENCOUNTER — Encounter: Payer: Self-pay | Admitting: Internal Medicine

## 2014-07-12 VITALS — BP 118/60 | HR 78 | Wt 236.6 lb

## 2014-07-12 DIAGNOSIS — IMO0002 Reserved for concepts with insufficient information to code with codable children: Secondary | ICD-10-CM

## 2014-07-12 DIAGNOSIS — E1129 Type 2 diabetes mellitus with other diabetic kidney complication: Secondary | ICD-10-CM | POA: Diagnosis not present

## 2014-07-12 DIAGNOSIS — E1121 Type 2 diabetes mellitus with diabetic nephropathy: Secondary | ICD-10-CM

## 2014-07-12 DIAGNOSIS — E1165 Type 2 diabetes mellitus with hyperglycemia: Secondary | ICD-10-CM | POA: Diagnosis not present

## 2014-07-12 LAB — HEMOGLOBIN A1C: Hgb A1c MFr Bld: 7.8 % — ABNORMAL HIGH (ref 4.6–6.5)

## 2014-07-12 MED ORDER — INSULIN REGULAR HUMAN 100 UNIT/ML IJ SOLN: INTRAMUSCULAR | Status: DC

## 2014-07-12 MED ORDER — INSULIN NPH (HUMAN) (ISOPHANE) 100 UNIT/ML ~~LOC~~ SUSP: 65.0000 [IU] | Freq: Two times a day (BID) | SUBCUTANEOUS | Status: DC

## 2014-07-12 NOTE — Progress Notes (Signed)
Patient ID: Eric Choi, male   DOB: [birth year], 74 y.o.   MRN: [id number]  HPI: [name] is a 74 y.o.-year-old male, returning for f/u for DM2 dx 1990, insulin-dependent since 2005, uncontrolled, with complications (CAD, CKD, PN). He is here with his wife, who offers part of the history. Last visit was 2 months ago.  He is feeling well, lost weight and tells me his sugars have not ben as good in 10 years!  Last hemoglobin A1c was: Lab Results  Component Value Date   HGBA1C 7.7* 03/25/2014   HGBA1C 9.0* 02/01/2014   HGBA1C 9.3* 01/18/2014   Pt was on: - NPH vial 160 units in am and 140 units before dinner He was on Metformin, but has CKD.   He is now on insulin N and R:  Insulin Before breakfast Before lunch Before dinner  Regular 25 with a smaller meal 30 with a regular meal 35 with a larger meal 15 units with a smaller meal 20 with a regular meal 30 with a larger meal 25 with a smaller meal 30 with a regular meal 35 with a larger meal  NPH 65  65    Pt checks his sugars 3-6x a day and they are better: - am: 90-200 >> 70-228 >> 60, 97-195, 211 >> 98-148, 191, 200 - 1-2h after b'fast: 150s >> 118-180 >> 128-209 >> 64x1; 99-178, 184 - before lunch: 60-100 if he does not eat a midmorning snack >> 81-180 >> 64-157 >> 69, 84-144, 167, 234 - 2h after lunch: n/c >> 94, 137-221 >> 94-287 (he does not take lunch insulin) >> 94-159 - before dinner: 150s >> 94, 101-230 >> 108-211 >> 57x1, 85-147, 172 - 1-2h after dinner: 120-125 >> 98-204 >> 93-251 >> 80 >> 73, 80-139, 202 - bedtime: <120 >> n/c - nighttime: can be 60-70 >> crackers and milk >> 53, 81, 161, 172 >> 66-203 Has occasional lows: lowest 50s. Lowest sugar was 60; he has hypoglycemia awareness at 90.  Highest sugar was >300s >> 200s >> 200s.  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  - Pt has CKD, last BUN/creatinine:  Lab Results  Component Value Date   BUN 17 02/01/2014   CREATININE 1.5 02/01/2014  He is on Lisinopril. - last set of lipids: Lab Results  Component Value Date   CHOL 141 02/01/2014   HDL 27.90* 02/01/2014   LDLDIRECT 77.5 02/01/2014   TRIG 260.0* 02/01/2014   CHOLHDL 5 02/01/2014  He is on Zocor. - last eye exam was in 11/2013. ?DR. Dr Radene Ou. Had cataracts.  - + numbness and tingling in his feet.  I reviewed his chart and he also has a history of HTN, HL.  Last TSH: Lab Results  Component Value Date   TSH 4.04 02/01/2014   I reviewed pt's medications, allergies, PMH, social hx, family hx, and changes were documented in the history of present illness. Otherwise, unchanged from my initial visit note.  ROS: Constitutional: no weight gain/loss, no fatigue, no subjective hyperthermia/hypothermia Eyes: no blurry vision, no xerophthalmia ENT: no sore throat, no nodules palpated in throat, no dysphagia/odynophagia, no hoarseness Cardiovascular: no CP/SOB/palpitations/leg swelling Respiratory: no cough/SOB Gastrointestinal: no N/V/D/C Musculoskeletal: no muscle/joint aches Skin: no rashes Neurological: no tremors/numbness/tingling/dizziness  PE: BP 118/60 mmHg  Pulse  78  Wt 236 lb 9.6 oz (107.321 kg)  SpO2 94% Body mass index is 35.69 kg/(m^2).  Wt Readings from Last 3 Encounters:  07/12/14 236 lb 9.6 oz (107.321 kg)  05/09/14 237 lb 12.8 oz (107.865 kg)  03/25/14 239 lb (108.41 kg)   Constitutional: overweight, in NAD Eyes: PERRLA, EOMI, no exophthalmos ENT: moist mucous membranes, no thyromegaly, no cervical lymphadenopathy Cardiovascular: RRR, No MRG Respiratory: CTA B Gastrointestinal: abdomen soft, NT, ND, BS+ Musculoskeletal: no deformities, strength intact in all 4 Skin: moist, warm, no rashes Neurological: no tremor with outstretched hands, DTR normal  in all 4  ASSESSMENT: 1. DM2, uncontrolled, insulin-dep, with complications - CAD - CKD - PN  PLAN:  1. Patient with long-standing, uncontrolled DM2, on basal-bolus insulin therapy. Sugars much better in last months after we switched to N and R insulins. He still has some lows after being more active, so I advised him to decrease the mealtime R dose by 5-8 units if he plans to be more active after a meal.  Overall, he is doing a great job with taking his insulin as advised and checking his sugars!!! I congratulated him! - I advised him to:  Patient Instructions   Please stop at the lab.  Please continue the insulin doses as follows:  Insulin Before breakfast Before lunch Before dinner  Regular 25 with a smaller meal 30 with a regular meal 35 with a larger meal 15 units with a smaller meal 20 with a regular meal 30 with a larger meal 25 with a smaller meal 30 with a regular meal 35 with a larger meal  NPH 65  65     Please come back for a follow-up appointment in 3 months with your sugar log.  - continue checking sugars at different times of the day - check 4 times a day, rotating checks - Up to date with yearly eye exams - given more sugar logs - check HbA1c today - he had PNA and flu vaccine this season -Return in about 3 months (around 10/10/2014).  Office Visit on 07/12/2014  Component Date Value Ref Range Status  . Hgb A1c MFr Bld 07/12/2014 7.8* 4.6 - 6.5 % Final   Glycemic Control Guidelines for People with Diabetes:Non Diabetic:  <6%Goal of Therapy: <7%Additional Action Suggested:  >8%   HbA1c same as before, but the next one I think will be even better!

## 2014-07-12 NOTE — Patient Instructions (Addendum)
Please stop at the lab..  Please continue the insulin doses as follows:  Insulin Before breakfast Before lunch Before dinner  Regular 25 with a smaller meal 30 with a regular meal 35 with a larger meal 15 units with a smaller meal 20 with a regular meal 30 with a larger meal 25 with a smaller meal 30 with a regular meal 35 with a larger meal  NPH 65  65     Please come back for a follow-up appointment in 3 months with your sugar log.

## 2014-07-16 ENCOUNTER — Telehealth: Payer: Self-pay | Admitting: *Deleted

## 2014-07-16 MED ORDER — INSULIN REGULAR HUMAN 100 UNIT/ML IJ SOLN: 15.0000 [IU] | Freq: Two times a day (BID) | INTRAMUSCULAR | Status: DC

## 2014-07-16 NOTE — Telephone Encounter (Signed)
Pharmacy sent a fax stating that ins is requiring Humulin R instead of Novolin R. Ok to change.

## 2014-08-13 ENCOUNTER — Telehealth: Payer: Self-pay | Admitting: Internal Medicine

## 2014-08-13 ENCOUNTER — Other Ambulatory Visit: Payer: Self-pay | Admitting: *Deleted

## 2014-08-13 MED ORDER — INSULIN REGULAR HUMAN 100 UNIT/ML IJ SOLN: 25.0000 [IU] | Freq: Three times a day (TID) | INTRAMUSCULAR | Status: DC

## 2014-08-13 NOTE — Telephone Encounter (Signed)
Rx for Humulin 5 sent to pharmacy with only taking 2 times daily, pt takes 3 times daily. Sending corrected rx refill to pt's pharmacy.

## 2014-08-16 ENCOUNTER — Other Ambulatory Visit: Payer: Self-pay | Admitting: Family Medicine

## 2014-08-28 NOTE — Telephone Encounter (Signed)
error 

## 2014-09-05 ENCOUNTER — Other Ambulatory Visit: Payer: Self-pay | Admitting: Family Medicine

## 2014-09-24 ENCOUNTER — Other Ambulatory Visit: Payer: Self-pay | Admitting: Family Medicine

## 2014-10-10 ENCOUNTER — Encounter: Payer: Self-pay | Admitting: Internal Medicine

## 2014-10-10 ENCOUNTER — Ambulatory Visit (INDEPENDENT_AMBULATORY_CARE_PROVIDER_SITE_OTHER): Payer: Medicare Other | Admitting: Internal Medicine

## 2014-10-10 VITALS — BP 126/60 | HR 77 | Temp 97.5°F | Resp 12 | Wt 238.0 lb

## 2014-10-10 DIAGNOSIS — E1129 Type 2 diabetes mellitus with other diabetic kidney complication: Secondary | ICD-10-CM

## 2014-10-10 DIAGNOSIS — E1121 Type 2 diabetes mellitus with diabetic nephropathy: Secondary | ICD-10-CM

## 2014-10-10 DIAGNOSIS — IMO0002 Reserved for concepts with insufficient information to code with codable children: Secondary | ICD-10-CM

## 2014-10-10 DIAGNOSIS — E1165 Type 2 diabetes mellitus with hyperglycemia: Secondary | ICD-10-CM

## 2014-10-10 LAB — HEMOGLOBIN A1C: Hgb A1c MFr Bld: 7.4 % — ABNORMAL HIGH (ref 4.6–6.5)

## 2014-10-10 NOTE — Progress Notes (Signed)
Patient ID: Eric Choi, male   DOB: [birth year], 74 y.o.   MRN: [id number]  HPI: Eric Choi is a 74 y.o.-year-old male, returning for f/u for DM2 dx 1990, insulin-dependent since 2005, uncontrolled, with complications (CAD, CKD, PN). He is here with his wife, who offers part of the history. Last visit was 3 months ago.  Last hemoglobin A1c was: Lab Results  Component Value Date   HGBA1C 7.8* 07/12/2014   HGBA1C 7.7* 03/25/2014   HGBA1C 9.0* 02/01/2014   He is now on insulin N and R:  Insulin Before breakfast Before lunch Before dinner  Regular 25 with a smaller meal 30 with a regular meal 35 with a larger meal 25 with a smaller meal 30 with a regular meal 35 with a larger meal 25 with a smaller meal 30 with a regular meal 35 with a larger meal  NPH 65  65    Pt checks his sugars 3-6x a day and they are fluctuating: - am: 90-200 >> 70-228 >> 60, 97-195, 211 >> 98-148, 191, 200 >> 90-185, most 132-187 - 1-2h after b'fast: 150s >> 118-180 >> 128-209 >> 64x1; 99-178, 184 >> n/c - before lunch:81-180 >> 64-157 >> 69, 84-144, 167, 234 >> 96-198, 206 - 2h after lunch: n/c >> 94, 137-221 >> 94-287 (he does not take lunch insulin) >> 94-159 >> 203 - before dinner: 150s >> 94, 101-230 >> 108-211 >> 57x1, 85-147, 172 >> 89-158, 200, 238 - 1-2h after dinner: 120-125 >> 98-204 >> 93-251 >> 80 >> 73, 80-139, 202 >> n/c - bedtime: <120 >> n/c - nighttime: 53, 81, 161, 172 >> 66-203 >> 68-133 (when working nights - 2-3 nights a week) Has occasional lows: lowest 50s. Lowest sugar was 68; he has hypoglycemia awareness at 90.  Highest sugar was >300s >> 200s >> 200s.  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  - Pt has CKD, last BUN/creatinine:  Lab Results  Component Value Date   BUN 17  02/01/2014   CREATININE 1.5 02/01/2014  He is on Lisinopril. - last set of lipids: Lab Results  Component Value Date   CHOL 141 02/01/2014   HDL 27.90* 02/01/2014   LDLDIRECT 77.5 02/01/2014   TRIG 260.0* 02/01/2014   CHOLHDL 5 02/01/2014  He is on Zocor. - last eye exam was in 11/2013. ?DR. Dr Radene Ou. Had cataracts.  - + numbness and tingling in his feet.  I reviewed his chart and he also has a history of HTN, HL.  Last TSH: Lab Results  Component Value Date   TSH 4.04 02/01/2014   I reviewed pt's medications, allergies, PMH, social hx, family hx, and changes were documented in the history of present illness. Otherwise, unchanged from my initial visit note.  ROS: Constitutional: no weight gain/loss, no fatigue, no subjective hyperthermia/hypothermia Eyes: no blurry vision, no xerophthalmia ENT: no sore throat, no nodules palpated in throat, no dysphagia/odynophagia, no hoarseness Cardiovascular: no CP/SOB/palpitations/leg swelling Respiratory: no cough/SOB Gastrointestinal: no N/V/D/C Musculoskeletal: no muscle/joint aches Skin: no rashes Neurological: no tremors/numbness/tingling/dizziness  PE: BP 126/60 mmHg  Pulse 77  Temp(Src) 97.5 F (36.4 C) (Oral)  Resp 12  Wt 238 lb (107.956 kg)  SpO2 95% Body mass index is 35.9 kg/(m^2).  Wt Readings from Last 3 Encounters:  10/10/14 238 lb (  107.956 kg)  07/12/14 236 lb 9.6 oz (107.321 kg)  05/09/14 237 lb 12.8 oz (107.865 kg)   Constitutional: overweight, in NAD Eyes: PERRLA, EOMI, no exophthalmos ENT: moist mucous membranes, no thyromegaly, no cervical lymphadenopathy Cardiovascular: RRR, No MRG Respiratory: CTA B Gastrointestinal: abdomen soft, NT, ND, BS+ Musculoskeletal: no deformities, strength intact in all 4 Skin: moist, warm, no rashes Neurological: no tremor with outstretched hands, DTR normal in all 4  ASSESSMENT: 1. DM2, uncontrolled, insulin-dep, with complications - CAD - CKD - PN  PLAN:  1.  Patient with long-standing, uncontrolled DM2, on basal-bolus insulin therapy. Sugars much better in last months after we switched to N and R insulins. He still has some lows at night when working >> will reduce the insulin with dinner before his work nights , but OTW, I advised him to try to have healthier snacks b/w meals (e.g peanuts instead of crackers) Overall, he is doing a great job with taking his insulin as advised and checking his sugars!!! I congratulated him!  - I advised him to:   Patient Instructions   Please stop at the lab.  Please continue the insulin doses as follows:  Insulin Before breakfast Before lunch Before dinner  Regular 25 with a smaller meal 30 with a regular meal 35 with a larger meal 15 units with a smaller meal 20 with a regular meal 30 with a larger meal 25 with a smaller meal 30 with a regular meal 35 with a larger meal  NPH 65  65    Please use no more than 25 units of regular insulin before dinner before the nights that that you work.  Please come back for a follow-up appointment in 3 months with your sugar log.  - continue checking sugars at different times of the day - check 4 times a day, rotating checks - Up to date with yearly eye exams - given more sugar logs - check HbA1c today -Return in about 3 months (around 01/10/2015).  Office Visit on 10/10/2014  Component Date Value Ref Range Status  . Hgb A1c MFr Bld 10/10/2014 7.4* 4.6 - 6.5 % Final   Glycemic Control Guidelines for People with Diabetes:Non Diabetic:  <6%Goal of Therapy: <7%Additional Action Suggested:  >8%    HbA1c has improved further!

## 2014-10-10 NOTE — Patient Instructions (Signed)
Patient Instructions   Please stop at the lab.  Please continue the insulin doses as follows:  Insulin Before breakfast Before lunch Before dinner  Regular 25 with a smaller meal 30 with a regular meal 35 with a larger meal 15 units with a smaller meal 20 with a regular meal 30 with a larger meal 25 with a smaller meal 30 with a regular meal 35 with a larger meal  NPH 65  65    Please use no more than 25 units of regular insulin before dinner before the nights that that you work.  Please come back for a follow-up appointment in 3 months with your sugar log.

## 2014-10-16 ENCOUNTER — Telehealth: Payer: Self-pay | Admitting: Internal Medicine

## 2014-10-16 MED ORDER — GLUCOSE BLOOD VI STRP: ORAL_STRIP | Status: DC

## 2014-10-16 NOTE — Telephone Encounter (Signed)
Patient need a reorder of test strips,sent to [facility]. Patient would like to know the results of his lab work.

## 2014-10-16 NOTE — Telephone Encounter (Signed)
Called pt and advised him that his A1c was 7.4, improved from 7.8 at last check. Pt voiced understanding. Refill for test strips to [facility] sent.

## 2014-10-17 DIAGNOSIS — E11339 Type 2 diabetes mellitus with moderate nonproliferative diabetic retinopathy without macular edema: Secondary | ICD-10-CM | POA: Diagnosis not present

## 2014-11-12 ENCOUNTER — Other Ambulatory Visit: Payer: Self-pay | Admitting: Family Medicine

## 2015-01-15 ENCOUNTER — Ambulatory Visit (INDEPENDENT_AMBULATORY_CARE_PROVIDER_SITE_OTHER): Payer: Medicare Other | Admitting: Internal Medicine

## 2015-01-15 ENCOUNTER — Other Ambulatory Visit (INDEPENDENT_AMBULATORY_CARE_PROVIDER_SITE_OTHER): Payer: Medicare Other | Admitting: *Deleted

## 2015-01-15 ENCOUNTER — Encounter: Payer: Self-pay | Admitting: Internal Medicine

## 2015-01-15 VITALS — BP 126/64 | HR 79 | Temp 97.9°F | Resp 12 | Wt 238.6 lb

## 2015-01-15 DIAGNOSIS — IMO0002 Reserved for concepts with insufficient information to code with codable children: Secondary | ICD-10-CM

## 2015-01-15 DIAGNOSIS — E1129 Type 2 diabetes mellitus with other diabetic kidney complication: Secondary | ICD-10-CM

## 2015-01-15 DIAGNOSIS — E1121 Type 2 diabetes mellitus with diabetic nephropathy: Secondary | ICD-10-CM

## 2015-01-15 DIAGNOSIS — E1165 Type 2 diabetes mellitus with hyperglycemia: Secondary | ICD-10-CM

## 2015-01-15 LAB — POCT GLYCOSYLATED HEMOGLOBIN (HGB A1C): Hemoglobin A1C: 7.7

## 2015-01-15 MED ORDER — INSULIN NPH (HUMAN) (ISOPHANE) 100 UNIT/ML ~~LOC~~ SUSP
SUBCUTANEOUS | Status: DC
Start: 2015-01-15 — End: 2015-04-01

## 2015-01-15 NOTE — Progress Notes (Signed)
Patient ID: Eric Choi, male   DOB: [birth year], 74 y.o.   MRN: [id number]  HPI: Eric Choi is a 74 y.o.-year-old male, returning for f/u for DM2 dx 1990, insulin-dependent since 2005, uncontrolled, with complications (CAD, CKD, PN). He is here with his wife, who offers part of the history. Last visit was 3 months ago.  Last hemoglobin A1c was: Lab Results  Component Value Date   HGBA1C 7.4* 10/10/2014   HGBA1C 7.8* 07/12/2014   HGBA1C 7.7* 03/25/2014   He is now on insulin N and R:  Insulin Before breakfast Before lunch Before dinner  Regular 20 with a smaller meal 30 with a regular meal 35 with a larger meal 20 with a smaller meal 30 with a regular meal 35 with a larger meal 20 with a smaller meal 30 with a regular meal 35 with a larger meal  NPH 70 10 70   Pt checks his sugars 3-6x a day: - am: 70-228 >> 60, 97-195, 211 >> 98-148, 191, 200 >> 90-185, most 132-187 >> 99-165, 190 - 1-2h after b'fast: 150s >> 118-180 >> 128-209 >> 64x1; 99-178, 184 >> n/c - before lunch:64-157 >> 69, 84-144, 167, 234 >> 96-198, 206 >> 87, 131-192, 221 - 2h after lunch: n/c >> 94, 137-221 >> 94-287 (he does not take lunch insulin) >> 94-159 >> 203 >> n/c - before dinner: 150s >> 94, 101-230 >> 108-211 >> 57x1, 85-147, 172 >> 89-158, 200, 238 >> 97-165, 175 - 1-2h after dinner: 120-125 >> 98-204 >> 93-251 >> 80 >> 73, 80-139, 202 >> n/c - bedtime: <120 >> n/c >> 63, 64, 119-182 - nighttime: 53, 81, 161, 172 >> 66-203 >> 68-133 (when working nights - 2-3 nights a week) Has occasional lows: lowest 50s >> 55; he has hypoglycemia awareness at 90.  Highest sugar was >300s >> 200s   Pt's meals are: - Breakfast: eggs, sausage, grits, coffee - Lunch: sandwich, crackers - Dinner: meat + veggies + starch - Snacks: 1-2: including after dinner; pears + mayonnaise, milk + crackers; cake Diet Dr Malachi Bonds  Drives a truck.  - Pt has CKD, last BUN/creatinine:  Lab Results  Component Value Date   BUN  17 02/01/2014   CREATININE 1.5 02/01/2014  He is on Lisinopril. - last set of lipids: Lab Results  Component Value Date   CHOL 141 02/01/2014   HDL 27.90* 02/01/2014   LDLDIRECT 77.5 02/01/2014   TRIG 260.0* 02/01/2014   CHOLHDL 5 02/01/2014  He is on Zocor. - last eye exam was in 11/2013. ?DR. Dr Radene Ou. Had cataracts.  - + numbness and tingling in his feet.  I reviewed his chart and he also has a history of HTN, HL.  Last TSH: Lab Results  Component Value Date   TSH 4.04 02/01/2014   I reviewed pt's medications, allergies, PMH, social hx, family hx, and changes were documented in the history of present illness. Otherwise, unchanged from my initial visit note.  ROS: Constitutional: no weight gain/loss, no fatigue, no subjective hyperthermia/hypothermia Eyes: no blurry vision, no xerophthalmia ENT: no sore throat, no nodules palpated in throat, no dysphagia/odynophagia, no hoarseness Cardiovascular: no CP/SOB/palpitations/leg swelling Respiratory: no cough/SOB Gastrointestinal: no N/V/D/C Musculoskeletal: no muscle/joint aches Skin: no rashes Neurological: no tremors/numbness/tingling/dizziness  PE: BP 126/64 mmHg  Pulse 79  Temp(Src) 97.9 F (36.6 C) (Oral)  Resp 12  Wt 238 lb 9.6 oz (108.228 kg)  SpO2 95% Body mass index is 35.99 kg/(m^2).  Wt Readings from Last  3 Encounters:  01/15/15 238 lb 9.6 oz (108.228 kg)  10/10/14 238 lb (107.956 kg)  07/12/14 236 lb 9.6 oz (107.321 kg)   Constitutional: overweight, in NAD Eyes: PERRLA, EOMI, no exophthalmos ENT: moist mucous membranes, no thyromegaly, no cervical lymphadenopathy Cardiovascular: RRR, No MRG Respiratory: CTA B Gastrointestinal: abdomen soft, NT, ND, BS+ Musculoskeletal: no deformities, strength intact in all 4 Skin: moist, warm, no rashes Neurological: no tremor with outstretched hands, DTR normal in all 4  ASSESSMENT: 1. DM2, uncontrolled, insulin-dep, with complications - CAD - CKD - PN -  DR  PLAN:  1. Patient with long-standing, uncontrolled DM2, on basal-bolus insulin therapy. Sugars slightly higher especially before lunch >> will raise b'fast R insulin. Also, decrease NPH in am as he has lows mid-day if being active. - I advised him to:  Patient Instructions   Please change: Insulin Before breakfast Before lunch Before dinner  Regular 25 with a smaller meal 30 with a regular meal 35 with a larger meal (if you are not active after breakfast) 25 with a smaller meal 30 with a regular meal 35 with a larger meal 25 with a smaller meal 30 with a regular meal 35 with a larger meal  NPH 60 10 70   Please return in 3 months with your sugar log.  - continue checking sugars at different times of the day - check 4 times a day, rotating checks - has yearly eye exams >> needs a new one - given more sugar logs - check HbA1c today >> 7.7% (increased) -Return in about 3 months (around 04/16/2015).

## 2015-01-15 NOTE — Patient Instructions (Signed)
Please change: Insulin Before breakfast Before lunch Before dinner  Regular 25 with a smaller meal 30 with a regular meal 35 with a larger meal (if you are not active after breakfast) 25 with a smaller meal 30 with a regular meal 35 with a larger meal 25 with a smaller meal 30 with a regular meal 35 with a larger meal  NPH 60 10 70   Please return in 3 months with your sugar log.

## 2015-02-18 ENCOUNTER — Other Ambulatory Visit: Payer: Self-pay | Admitting: Family Medicine

## 2015-03-06 ENCOUNTER — Other Ambulatory Visit: Payer: Self-pay | Admitting: *Deleted

## 2015-03-06 MED ORDER — INSULIN REGULAR HUMAN 100 UNIT/ML IJ SOLN: 25.0000 [IU] | Freq: Three times a day (TID) | INTRAMUSCULAR | Status: DC

## 2015-03-12 ENCOUNTER — Ambulatory Visit (INDEPENDENT_AMBULATORY_CARE_PROVIDER_SITE_OTHER): Payer: Medicare Other | Admitting: Family Medicine

## 2015-03-12 ENCOUNTER — Encounter: Payer: Self-pay | Admitting: Family Medicine

## 2015-03-12 VITALS — BP 130/70 | Temp 97.5°F | Wt 239.0 lb

## 2015-03-12 DIAGNOSIS — E785 Hyperlipidemia, unspecified: Secondary | ICD-10-CM | POA: Diagnosis not present

## 2015-03-12 DIAGNOSIS — E1165 Type 2 diabetes mellitus with hyperglycemia: Secondary | ICD-10-CM

## 2015-03-12 DIAGNOSIS — I10 Essential (primary) hypertension: Secondary | ICD-10-CM | POA: Diagnosis not present

## 2015-03-12 DIAGNOSIS — R351 Nocturia: Secondary | ICD-10-CM

## 2015-03-12 DIAGNOSIS — N401 Enlarged prostate with lower urinary tract symptoms: Secondary | ICD-10-CM | POA: Diagnosis not present

## 2015-03-12 DIAGNOSIS — IMO0002 Reserved for concepts with insufficient information to code with codable children: Secondary | ICD-10-CM

## 2015-03-12 DIAGNOSIS — E1121 Type 2 diabetes mellitus with diabetic nephropathy: Secondary | ICD-10-CM

## 2015-03-12 LAB — BASIC METABOLIC PANEL
BUN: 18 mg/dL (ref 6–23)
CO2: 29 mEq/L (ref 19–32)
Calcium: 10 mg/dL (ref 8.4–10.5)
Chloride: 102 mEq/L (ref 96–112)
Creatinine, Ser: 1.54 mg/dL — ABNORMAL HIGH (ref 0.40–1.50)
GFR: 47.33 mL/min — ABNORMAL LOW (ref >60.00)
Glucose, Bld: 211 mg/dL — ABNORMAL HIGH (ref 70–99)
Potassium: 5.4 mEq/L — ABNORMAL HIGH (ref 3.5–5.1)
Sodium: 140 mEq/L (ref 135–145)

## 2015-03-12 LAB — POCT URINALYSIS DIPSTICK
Bilirubin, UA: NEGATIVE
Leukocytes, UA: NEGATIVE
Nitrite, UA: NEGATIVE
Spec Grav, UA: 1.025
Urobilinogen, UA: 0.2
pH, UA: 5.5

## 2015-03-12 LAB — CBC WITH DIFFERENTIAL/PLATELET
Basophils Absolute: 0.1 10*3/uL (ref 0.0–0.1)
Basophils Relative: 0.4 % (ref 0.0–3.0)
Eosinophils Absolute: 0.7 10*3/uL (ref 0.0–0.7)
Eosinophils Relative: 5.3 % — ABNORMAL HIGH (ref 0.0–5.0)
HCT: 48.7 % (ref 39.0–52.0)
Hemoglobin: 16.1 g/dL (ref 13.0–17.0)
Lymphocytes Relative: 24.3 % (ref 12.0–46.0)
Lymphs Abs: 3.2 10*3/uL (ref 0.7–4.0)
MCHC: 33.2 g/dL (ref 30.0–36.0)
MCV: 89.3 fl (ref 78.0–100.0)
Monocytes Absolute: 1.4 10*3/uL — ABNORMAL HIGH (ref 0.1–1.0)
Monocytes Relative: 10.3 % (ref 3.0–12.0)
Neutro Abs: 7.9 10*3/uL — ABNORMAL HIGH (ref 1.4–7.7)
Neutrophils Relative %: 59.7 % (ref 43.0–77.0)
Platelets: 264 10*3/uL (ref 150.0–400.0)
RBC: 5.46 Mil/uL (ref 4.22–5.81)
RDW: 14.9 % (ref 11.5–15.5)
WBC: 13.2 10*3/uL — ABNORMAL HIGH (ref 4.0–10.5)

## 2015-03-12 LAB — LDL CHOLESTEROL, DIRECT: Direct LDL: 80 mg/dL

## 2015-03-12 LAB — LIPID PANEL
Cholesterol: 163 mg/dL (ref 0–200)
HDL: 35.8 mg/dL — ABNORMAL LOW (ref >39.00)
NonHDL: 127.46
Total CHOL/HDL Ratio: 5
Triglycerides: 378 mg/dL — ABNORMAL HIGH (ref 0.0–149.0)
VLDL: 75.6 mg/dL — ABNORMAL HIGH (ref 0.0–40.0)

## 2015-03-12 LAB — PSA: PSA: 0.73 ng/mL (ref 0.10–4.00)

## 2015-03-12 LAB — HEPATIC FUNCTION PANEL
ALT: 20 U/L (ref 0–53)
AST: 17 U/L (ref 0–37)
Albumin: 4.3 g/dL (ref 3.5–5.2)
Alkaline Phosphatase: 53 U/L (ref 39–117)
Bilirubin, Direct: 0.1 mg/dL (ref 0.0–0.3)
Total Bilirubin: 0.7 mg/dL (ref 0.2–1.2)
Total Protein: 7 g/dL (ref 6.0–8.3)

## 2015-03-12 LAB — TSH: TSH: 1.62 u[IU]/mL (ref 0.35–4.50)

## 2015-03-12 MED ORDER — SIMVASTATIN 40 MG PO TABS: ORAL_TABLET | ORAL | Status: DC

## 2015-03-12 MED ORDER — AMLODIPINE BESYLATE 10 MG PO TABS: ORAL_TABLET | ORAL | Status: DC

## 2015-03-12 MED ORDER — LISINOPRIL 20 MG PO TABS: ORAL_TABLET | ORAL | Status: DC

## 2015-03-12 MED ORDER — METOPROLOL TARTRATE 50 MG PO TABS
50.0000 mg | ORAL_TABLET | Freq: Every day | ORAL | Status: DC
Start: 2015-03-12 — End: 2015-03-14

## 2015-03-12 NOTE — Patient Instructions (Signed)
Continue current medications  Labs today..........Marland Kitchen we will call you if there is anything abnormal  Set up a time in the next 3-6 months to see Georgina Snell or Almyra Free for a physical exam and to get established

## 2015-03-12 NOTE — Progress Notes (Signed)
   Subjective:    Patient ID: Eric Choi, male    DOB: 1940-06-21, 74 y.o.   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  He takes Lopressor 50 mg daily, lisinopril 20 mg daily, and Norvasc 10 mg daily. BP today 130/70  We referred him to Dr. Lorie Apley for evaluation of his diabetes because it was under poor control. Is now under improved control with her excellent care. He states he uses a long-acting insulin 70 units daily and is on a sliding scale of regular before each meal. He's due to see her December 2 for follow-up  Last physical exam here was about a year ago.   Review of Systems Review of systems otherwise negative except he's not walking on a regular basis    Objective:   Physical Exam  Well-developed well-nourished male no acute distress vital signs stable he is afebrile      Assessment & Plan:  Diabetes type 1........ approaching goal........ continue to work with endocrinologist  Hypertension at goal....... continue current medications  Hyperlipidemia........Marland Kitchen recheck labs  Set up CPX with Eric Choi or Eric Choi to establish

## 2015-03-12 NOTE — Progress Notes (Signed)
Pre visit review using our clinic review tool, if applicable. No additional management support is needed unless otherwise documented below in the visit note. 

## 2015-03-13 DIAGNOSIS — E113393 Type 2 diabetes mellitus with moderate nonproliferative diabetic retinopathy without macular edema, bilateral: Secondary | ICD-10-CM | POA: Diagnosis not present

## 2015-03-13 DIAGNOSIS — H35043 Retinal micro-aneurysms, unspecified, bilateral: Secondary | ICD-10-CM | POA: Diagnosis not present

## 2015-03-13 DIAGNOSIS — H3563 Retinal hemorrhage, bilateral: Secondary | ICD-10-CM | POA: Diagnosis not present

## 2015-03-14 ENCOUNTER — Telehealth: Payer: Self-pay | Admitting: Family Medicine

## 2015-03-14 DIAGNOSIS — E785 Hyperlipidemia, unspecified: Secondary | ICD-10-CM

## 2015-03-14 MED ORDER — METOPROLOL TARTRATE 50 MG PO TABS: 50.0000 mg | ORAL_TABLET | Freq: Every day | ORAL | Status: DC

## 2015-03-14 MED ORDER — SIMVASTATIN 40 MG PO TABS
ORAL_TABLET | ORAL | Status: DC
Start: 2015-03-14 — End: 2016-06-13

## 2015-03-14 MED ORDER — LISINOPRIL 20 MG PO TABS: ORAL_TABLET | ORAL | Status: DC

## 2015-03-14 NOTE — Telephone Encounter (Signed)
Pt prescriptions was called in to [facility] . Pt said his prescriptions should be sent to [facility] on [address]   lisinopril (PRINIVIL,ZESTRIL) 20 MG tablet simvastatin (ZOCOR) 40 MG tablet  metoprolol (LOPRESSOR) 50 MG tablet metoprolol (LOPRESSOR) 50 MG tablet

## 2015-03-14 NOTE — Telephone Encounter (Signed)
Refills sent

## 2015-03-18 ENCOUNTER — Other Ambulatory Visit: Payer: Self-pay | Admitting: Family Medicine

## 2015-03-21 DIAGNOSIS — E113493 Type 2 diabetes mellitus with severe nonproliferative diabetic retinopathy without macular edema, bilateral: Secondary | ICD-10-CM | POA: Diagnosis not present

## 2015-03-21 DIAGNOSIS — Z961 Presence of intraocular lens: Secondary | ICD-10-CM | POA: Diagnosis not present

## 2015-03-21 LAB — HM DIABETES EYE EXAM

## 2015-03-31 ENCOUNTER — Telehealth: Payer: Self-pay | Admitting: Internal Medicine

## 2015-03-31 MED ORDER — INSULIN REGULAR HUMAN 100 UNIT/ML IJ SOLN: 25.0000 [IU] | Freq: Three times a day (TID) | INTRAMUSCULAR | Status: DC

## 2015-03-31 NOTE — Telephone Encounter (Signed)
Done

## 2015-03-31 NOTE — Telephone Encounter (Signed)
Pt wife calling regarding the humulin he administers 3 times daily please call in new rx for that amount to [facility] on [address]

## 2015-04-01 ENCOUNTER — Other Ambulatory Visit: Payer: Self-pay | Admitting: *Deleted

## 2015-04-01 MED ORDER — INSULIN NPH (HUMAN) (ISOPHANE) 100 UNIT/ML ~~LOC~~ SUSP: SUBCUTANEOUS | Status: DC

## 2015-04-09 ENCOUNTER — Encounter: Payer: Self-pay | Admitting: Family Medicine

## 2015-04-17 ENCOUNTER — Encounter: Payer: Self-pay | Admitting: Family Medicine

## 2015-04-18 ENCOUNTER — Ambulatory Visit (INDEPENDENT_AMBULATORY_CARE_PROVIDER_SITE_OTHER): Payer: Medicare Other | Admitting: Internal Medicine

## 2015-04-18 ENCOUNTER — Other Ambulatory Visit (INDEPENDENT_AMBULATORY_CARE_PROVIDER_SITE_OTHER): Payer: Medicare Other | Admitting: *Deleted

## 2015-04-18 ENCOUNTER — Encounter: Payer: Self-pay | Admitting: Internal Medicine

## 2015-04-18 VITALS — BP 122/64 | HR 76 | Wt 239.4 lb

## 2015-04-18 DIAGNOSIS — IMO0002 Reserved for concepts with insufficient information to code with codable children: Secondary | ICD-10-CM

## 2015-04-18 DIAGNOSIS — E1165 Type 2 diabetes mellitus with hyperglycemia: Secondary | ICD-10-CM

## 2015-04-18 DIAGNOSIS — E1121 Type 2 diabetes mellitus with diabetic nephropathy: Secondary | ICD-10-CM | POA: Diagnosis not present

## 2015-04-18 LAB — POCT GLYCOSYLATED HEMOGLOBIN (HGB A1C): Hemoglobin A1C: 7.5

## 2015-04-18 NOTE — Progress Notes (Signed)
Patient ID: Eric Choi, male   DOB: August 16, 1940, 74 y.o.   MRN: [id number]  HPI: Eric Choi is a 74 y.o.-year-old male, returning for f/u for DM2 dx 1990, insulin-dependent since 2005, uncontrolled, with complications (CAD, CKD, PN, DR). He is here with his wife, who offers part of the history. Last visit was 3 months ago.  He is not working 3rd shift anymore >> sugars better in last 3 weeks.  Last hemoglobin A1c was: Lab Results  Component Value Date   HGBA1C 7.7 01/15/2015   HGBA1C 7.4* 10/10/2014   HGBA1C 7.8* 07/12/2014   He is now on insulin N and R:  Insulin Before breakfast Before lunch Before dinner  Regular 25 with a smaller meal 30 with a regular meal 35 with a larger meal (if you are not active after breakfast) 25 with a smaller meal 30 with a regular meal 35 with a larger meal 25 with a smaller meal 30 with a regular meal 35 with a larger meal  NPH 60 10 70    Pt checks his sugars 3-6x a day. Sugars are higher when he skips insulin or overeats: - am: 70-228 >> 60, 97-195, 211 >> 98-148, 191, 200 >> 90-185, most 132-187 >> 99-165, 190 >> 78, 105-194, 204, 220 - 1-2h after b'fast: 150s >> 118-180 >> 128-209 >> 64x1; 99-178, 184 >> n/c - before lunch:64-157 >> 69, 84-144, 167, 234 >> 96-198, 206 >> 87, 131-192, 221 >> 71-161, 197 - 2h after lunch: n/c >> 94, 137-221 >> 94-287 (he does not take lunch insulin) >> 94-159 >> 203 >> n/c - before dinner: 150s >> 94, 101-230 >> 108-211 >> 57x1, 85-147, 172 >> 89-158, 200, 238 >> 97-165, 175 >> 67, 74-176, 191 - 1-2h after dinner: 120-125 >> 98-204 >> 93-251 >> 80 >> 73, 80-139, 202 >> n/c - bedtime: <120 >> n/c >> 63, 64, 119-182 >> 105-125 - nighttime: 53, 81, 161, 172 >> 66-203 >> 68-133 (when working nights - 2-3 nights a week) Has occasional lows: lowest 50s >> 55; he has hypoglycemia awareness at 90.  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  - Pt has CKD, last BUN/creatinine:  Lab Results  Component Value Date   BUN 18 03/12/2015   CREATININE 1.54* 03/12/2015  He is on Lisinopril. - last set of lipids: Lab Results  Component Value Date   CHOL 163 03/12/2015   HDL 35.80* 03/12/2015   LDLDIRECT 80.0 03/12/2015   TRIG 378.0* 03/12/2015   CHOLHDL 5 03/12/2015  He is on Zocor. - last eye exam was in 03/21/2015. + DR. Dr Radene Ou. Had cataracts.  - + numbness and tingling in his feet.  I reviewed his chart and he also has a history of HTN, HL.  Last TSH: Lab Results  Component Value Date   TSH 1.62 03/12/2015   I reviewed pt's medications, allergies, PMH, social hx, family hx, and changes were documented in the history of present illness. Otherwise, unchanged from my initial visit note.  ROS: Constitutional: no weight gain/loss, no fatigue, no subjective hyperthermia/hypothermia Eyes: no blurry vision, no xerophthalmia ENT: no sore throat, no nodules palpated in throat, no dysphagia/odynophagia, no hoarseness Cardiovascular: no CP/SOB/palpitations/leg swelling Respiratory: no cough/SOB Gastrointestinal: no N/V/D/C Musculoskeletal: no  muscle/joint aches Skin: no rashes Neurological: no tremors/numbness/tingling/dizziness  PE: BP 122/64 mmHg  Pulse 76  Wt 239 lb 6.4 oz (108.591 kg)  SpO2 97% Body mass index is 36.12 kg/(m^2).  Wt Readings from Last 3 Encounters:  04/18/15 239 lb 6.4 oz (108.591 kg)  03/12/15 239 lb (108.41 kg)  01/15/15 238 lb 9.6 oz (108.228 kg)   Constitutional: overweight, in NAD Eyes: PERRLA, EOMI, no exophthalmos ENT: moist mucous membranes, no thyromegaly, no cervical lymphadenopathy Cardiovascular: RRR, No MRG Respiratory: CTA B Gastrointestinal: abdomen soft, NT, ND, BS+ Musculoskeletal: no deformities, strength intact in  all 4 Skin: moist, warm, no rashes Neurological: no tremor with outstretched hands, DTR normal in all 4  ASSESSMENT: 1. DM2, uncontrolled, insulin-dep, with complications - CAD - CKD - PN - DR  PLAN:  1. Patient with long-standing, uncontrolled DM2, on basal-bolus insulin therapy. Sugars were slightly higher at last visit >> we increased b'fast R insulin and decreased NPH in am as he had lows mid-day if being active. Lats A1c was a little higher, at 7.7%. At this visit, sugars are still fluctuating, but higher in am. He is adjusting his insulin dosages based on the sugars before a meal. 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  - I advised him to continue:   Insulin Before breakfast Before lunch Before dinner  Regular 25 with a smaller meal 30 with a regular meal 35 with a larger meal (if you are not active after breakfast) 25 with a smaller meal 30 with a regular meal 35 with a larger meal 25 with a smaller meal 30 with a regular meal 35 with a larger meal  NPH 60 10 70   Please come back for a follow-up appointment in 3 months  - continue checking sugars at different times of the day - check 3-4 times a day, rotating checks - has yearly eye exams >> UTD - given more sugar logs - check HbA1c today >> 7.5% (lower) -Return in about 3 months (around 07/17/2015).

## 2015-04-18 NOTE — Patient Instructions (Signed)
Insulin Before breakfast Before lunch Before dinner  Regular 25 with a smaller meal 30 with a regular meal 35 with a larger meal (if you are not active after breakfast) 25 with a smaller meal 30 with a regular meal 35 with a larger meal 25 with a smaller meal 30 with a regular meal 35 with a larger meal  NPH 60 10 70   Please come back for a follow-up appointment in 3 months

## 2015-05-29 MED FILL — HumuLIN N 100 UNIT/ML SUSP: 100 | 26 days supply | Qty: 40 | Fill #1

## 2015-05-29 MED FILL — HumuLIN R 100 UNIT/ML SOLN: 100 | 28 days supply | Qty: 30 | Fill #1

## 2015-06-13 ENCOUNTER — Ambulatory Visit (INDEPENDENT_AMBULATORY_CARE_PROVIDER_SITE_OTHER): Payer: Medicare Other | Admitting: Adult Health

## 2015-06-13 ENCOUNTER — Encounter: Payer: Self-pay | Admitting: Adult Health

## 2015-06-13 VITALS — BP 126/70 | Temp 97.9°F | Ht 68.25 in | Wt 241.9 lb

## 2015-06-13 DIAGNOSIS — I10 Essential (primary) hypertension: Secondary | ICD-10-CM | POA: Diagnosis not present

## 2015-06-13 DIAGNOSIS — Z7689 Persons encountering health services in other specified circumstances: Secondary | ICD-10-CM

## 2015-06-13 DIAGNOSIS — Z7189 Other specified counseling: Secondary | ICD-10-CM

## 2015-06-13 DIAGNOSIS — H6122 Impacted cerumen, left ear: Secondary | ICD-10-CM

## 2015-06-13 DIAGNOSIS — E785 Hyperlipidemia, unspecified: Secondary | ICD-10-CM | POA: Diagnosis not present

## 2015-06-13 NOTE — Progress Notes (Signed)
HPI:  Eric Choi is here to establish care. He is a pleasant 75 year old caucasian male who  has a past medical history of DIABETES MELLITUS, TYPE II (11/03/2006); HYPERLIPIDEMIA (11/03/2006); HYPERTENSION (11/03/2006); and CAD (coronary artery disease).  Last PCP and physical: October 2016 with MD Sherren Mocha Immunizations: UTD Diet: Does not follow a diet.  Exercise:  Walks on the treadmill.  Colonoscopy:2015 - every 5 years.   He is followed by: - Dr. Cruzita Lederer - Endocrinology   Has the following chronic problems that require follow up and concerns today:  Uncontrolled diabetes Type II - He last saw Dr. Cruzita Lederer on 04/18/2015. He enjoys going there and feels as though she has done wonders for his diabetes. He reports " this is the best I have felt in a long time. 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   ROS negative for unless reported above: fevers, chills,feeling poorly, unintentional weight loss, hearing or vision loss, chest pain, palpitations, leg claudication, struggling to breath,Not feeling congested in the chest, no orthopenia, no cough,no wheezing, normal appetite, no soft tissue swelling, no hemoptysis, melena, hematochezia, hematuria, falls, loc, si, or thoughts of self harm.    Past Medical History  Diagnosis Date  . DIABETES MELLITUS, TYPE II 11/03/2006  . HYPERLIPIDEMIA 11/03/2006  . HYPERTENSION 11/03/2006  . CAD (coronary artery disease)     Past Surgical History  Procedure Laterality Date  . Angioplasty  20 years ago    percutaneous transluminal   . Cataract extraction  2002, 2005    bilateral  . Ptca      Family History  Problem Relation Age of Onset  . COPD Father   . Alcohol abuse Father   . Diabetes Father   . Cancer Sister     breast  . Colon cancer Neg Hx     Social History   Social History  . Marital Status: Married    Spouse Name: N/A  . Number of Children: N/A  . Years of  Education: N/A   Social History Main Topics  . Smoking status: Former Research scientist (life sciences)  . Smokeless tobacco: Never Used  . Alcohol Use: No  . Drug Use: No  . Sexual Activity: Not Asked   Other Topics Concern  . None   Social History Narrative   Works 3rd shift   Regular Exercise-yes     Current outpatient prescriptions:  .  amLODipine (NORVASC) 10 MG tablet, TAKE ONE TABLET BY MOUTH DAILY, Disp: 90 tablet, Rfl: 2 .  aspirin 81 MG tablet, Take 81 mg by mouth daily.  , Disp: , Rfl:  .  Blood Glucose Monitoring Suppl (ACCU-CHEK AVIVA PLUS) W/DEVICE KIT, Use to check blood sugars 2 times per day. Dx code 250.03, Disp: 1 kit, Rfl: 0 .  glucose blood (ACCU-CHEK AVIVA PLUS) test strip, Use to test blood sugar 4 times daily as instructed. Dx code: E11.29, Disp: 400 each, Rfl: 3 .  insulin NPH Human (HUMULIN N,NOVOLIN N) 100 UNIT/ML injection, Inject 70 units into the skin before breakfast, 10 units before lunch and 70 units before dinner as advised., Disp: 50 mL, Rfl: 2 .  insulin regular (HUMULIN R) 100 units/mL injection, Inject 0.25-0.35 mLs (25-35 Units total) into the skin 3 (three) times daily before meals., Disp: 30 mL, Rfl: 5 .  Lancets Misc. (ACCU-CHEK MULTICLIX LANCET DEV) KIT, , Disp: , Rfl:  .  lisinopril (PRINIVIL,ZESTRIL) 20 MG tablet,  TAKE 1 TABLET (20 MG TOTAL) BY MOUTH DAILY., Disp: 90 tablet, Rfl: 3 .  metoprolol (LOPRESSOR) 50 MG tablet, Take 1 tablet (50 mg total) by mouth daily., Disp: 180 tablet, Rfl: 3 .  NEEDLE, DISP, 30 G (B-D DISP NEEDLE 30GX1") 30G X 1" MISC, by Does not apply route as directed.  , Disp: , Rfl:  .  simvastatin (ZOCOR) 40 MG tablet, TAKE 1 TABLET (40 MG TOTAL) BY MOUTH AT BEDTIME., Disp: 90 tablet, Rfl: 3  EXAM:  Filed Vitals:   06/13/15 0816  BP: 126/70  Temp: 97.9 F (36.6 C)    Body mass index is 36.49 kg/(m^2).  GENERAL: vitals reviewed and listed above, alert, oriented, appears well hydrated and in no acute distress  HEENT: atraumatic,  conjunttiva clear, no obvious abnormalities on inspection of external nose and ears.Cerumen impaction in left ear  NECK: Neck is soft and supple without masses, no adenopathy or thyromegaly, trachea midline, no JVD. Normal range of motion.   LUNGS: clear to auscultation bilaterally, no wheezes, rales or rhonchi, good air movement  CV: Regular rate and rhythm, normal S1/S2, no audible murmurs, gallops, or rubs. No carotid bruit and no peripheral edema.   MS: moves all extremities without noticeable abnormality. No edema noted  Abd: soft/nontender/nondistended/normal bowel sounds. Obese around abdomen.    Skin: warm and dry, no rash   Extremities: No clubbing, cyanosis, or edema. Capillary refill is WNL. Pulses intact bilaterally in upper and lower extremities.   Neuro: CN II-XII intact, sensation and reflexes normal throughout, 5/5 muscle strength in bilateral upper and lower extremities. Normal finger to nose. Normal rapid alternating movements. Normal romberg. No pronator drift.   PSYCH: pleasant and cooperative, no obvious depression or anxiety  ASSESSMENT AND PLAN:  1. Encounter to establish care - Follow up in October 2016 for CPE - Follow up in 6 months for recheck.  - I would like for him to lose weight  2. Hyperlipidemia - Encouraged healthy eating and increasing exercise - Can take Omega 3 - Consider Zetia  3. Essential hypertension - Controlled on current medication  4. Cerumen impaction, left - Cerumen impaction removed from left ear. No signs of infection.   -We reviewed the PMH, PSH, FH, SH, Meds and Allergies. -We provided refills for any medications we will prescribe as needed. -We addressed current concerns per orders and patient instructions. -We have asked for records for pertinent exams, studies, vaccines and notes from previous providers. -We have advised patient to follow up per instructions below.   -Patient advised to return or notify a provider  immediately if symptoms worsen or persist or new concerns arise.   [name], AGNP

## 2015-06-13 NOTE — Progress Notes (Signed)
Pre visit review using our clinic review tool, if applicable. No additional management support is needed unless otherwise documented below in the visit note. 

## 2015-06-13 NOTE — Patient Instructions (Addendum)
It was great meeting you today!  You are due for your next physical in October 2017  Work on diet and exercise. Small changes will help out in the long run.   Follow up with me in 6 months or sooner if needed.

## 2015-06-30 MED FILL — HumuLIN R 100 UNIT/ML SOLN: 100 | 28 days supply | Qty: 30 | Fill #2

## 2015-07-01 ENCOUNTER — Other Ambulatory Visit: Payer: Self-pay | Admitting: Internal Medicine

## 2015-07-01 MED FILL — HumuLIN N 100 UNIT/ML SUSP: 100 | 26 days supply | Qty: 40 | Fill #0

## 2015-07-18 ENCOUNTER — Other Ambulatory Visit (INDEPENDENT_AMBULATORY_CARE_PROVIDER_SITE_OTHER): Payer: Medicare Other | Admitting: *Deleted

## 2015-07-18 ENCOUNTER — Encounter: Payer: Self-pay | Admitting: Internal Medicine

## 2015-07-18 ENCOUNTER — Ambulatory Visit (INDEPENDENT_AMBULATORY_CARE_PROVIDER_SITE_OTHER): Payer: Medicare Other | Admitting: Internal Medicine

## 2015-07-18 VITALS — BP 108/62 | HR 78 | Temp 97.7°F | Resp 12 | Wt 240.0 lb

## 2015-07-18 DIAGNOSIS — E1165 Type 2 diabetes mellitus with hyperglycemia: Secondary | ICD-10-CM

## 2015-07-18 DIAGNOSIS — IMO0002 Reserved for concepts with insufficient information to code with codable children: Secondary | ICD-10-CM

## 2015-07-18 DIAGNOSIS — E1121 Type 2 diabetes mellitus with diabetic nephropathy: Secondary | ICD-10-CM

## 2015-07-18 LAB — POCT GLYCOSYLATED HEMOGLOBIN (HGB A1C): Hemoglobin A1C: 7.5

## 2015-07-18 MED ORDER — INSULIN NPH (HUMAN) (ISOPHANE) 100 UNIT/ML ~~LOC~~ SUSP: SUBCUTANEOUS | Status: DC

## 2015-07-18 NOTE — Progress Notes (Signed)
Patient ID: Eric Choi, male   DOB: [birth year], 75 y.o.   MRN: [id number]  HPI: Eric Choi is a 75 y.o.-year-old male, returning for f/u for DM2 dx 1990, insulin-dependent since 2005, uncontrolled, with complications (CAD, CKD, PN, DR). Last visit was 3 months ago.  He stopped working 3rd shift before last visit >> sugars better >> HbA1c started to improve. He feels great!  He started to drink a tea (? Name) 1 week ago >> sugars much better in last week! His hunger improved. He also started a protein shake.  Last hemoglobin A1c was: Lab Results  Component Value Date   HGBA1C 7.5 04/18/2015   HGBA1C 7.7 01/15/2015   HGBA1C 7.4* 10/10/2014   He is now on insulin N and R:  Insulin Before breakfast Before lunch Before dinner  Regular 25 with a smaller meal 30 with a regular meal 35 with a larger meal (if you are not active after breakfast) 25 with a smaller meal 30 with a regular meal 35 with a larger meal 25 with a smaller meal 30 with a regular meal 35 with a larger meal  NPH 60 10-20 70    Pt checks his sugars 3-6x a day: - am: 98-148, 191, 200 >> 90-185, most 132-187 >> 99-165, 190 >> 78, 105-194, 204, 220 >> 134-204 before tea/94-146 after started to drink the tea - 1-2h after b'fast: 150s >> 118-180 >> 128-209 >> 64x1; 99-178, 184 >> n/c - before lunch:64-157 >> 69, 84-144, 167, 234 >> 96-198, 206 >> 87, 131-192, 221 >> 71-161, 197 >> 86-141 (after tea) - 2h after lunch: n/c >> 94, 137-221 >> 94-287 (he does not take lunch insulin) >> 94-159 >> 203 >> n/c >> 143 (after tea) - before dinner: 57x1, 85-147, 172 >> 89-158, 200, 238 >> 97-165, 175 >> 67, 74-176, 191 >> 87-131, 180 (after tea) - 1-2h after dinner: 120-125 >> 98-204 >> 93-251 >> 80 >> 73, 80-139, 202 >> n/c - bedtime: <120 >> n/c >> 63, 64, 119-182 >> 105-125 >> 89 - nighttime: 53, 81, 161, 172 >> 66-203  >> 68-133 (when working nights - 2-3 nights a week) >> 70-96 Has occasional lows: lowest 50s >> 55 >> 70;  he has hypoglycemia awareness at 90.  Highest sugar was >300s >> 200s >> 204  Pt's meals are: - Breakfast: eggs, sausage, grits, coffee - Lunch: sandwich, crackers - Dinner: meat + veggies + starch - Snacks: 1-2: including after dinner; pears + mayonnaise, milk + crackers; cake Diet Dr Malachi Bonds  He drives a truck.  - Pt has CKD, last BUN/creatinine:  Lab Results  Component Value Date   BUN 18 03/12/2015   CREATININE 1.54* 03/12/2015  He is on Lisinopril. - last set of lipids: Lab Results  Component Value Date   CHOL 163 03/12/2015   HDL 35.80* 03/12/2015   LDLDIRECT 80.0 03/12/2015   TRIG 378.0* 03/12/2015   CHOLHDL 5 03/12/2015  He is on Zocor. - last eye exam was in 03/21/2015. + DR. Dr Radene Ou. Had cataracts.  - + numbness and tingling in his feet.  I reviewed his chart and he also has a history of HTN, HL.  Last TSH: Lab Results  Component Value Date   TSH 1.62 03/12/2015   I reviewed pt's medications, allergies, PMH, social hx, family hx, and changes were documented in the history of present illness. Otherwise, unchanged from my initial visit note.  ROS: Constitutional: no weight gain/loss, no fatigue, no subjective hyperthermia/hypothermia  Eyes: no blurry vision, no xerophthalmia ENT: no sore throat, no nodules palpated in throat, + occas. Dysphagia/no odynophagia, no hoarseness Cardiovascular: no CP/SOB/palpitations/leg swelling Respiratory: no cough/SOB Gastrointestinal: no N/V/D/C Musculoskeletal: no muscle/joint aches Skin: no rashes Neurological: no tremors/numbness/tingling/dizziness  PE: BP 108/62 mmHg  Pulse 78  Temp(Src) 97.7 F (36.5 C) (Oral)  Resp 12  Wt 240 lb (108.863 kg)  SpO2 94% Body mass index is 36.21 kg/(m^2).  Wt Readings from Last 3 Encounters:  07/18/15 240 lb (108.863 kg)  06/13/15 241 lb 14.4 oz (109.725 kg)  04/18/15 239 lb 6.4 oz (108.591 kg)   Constitutional: overweight, in NAD Eyes: PERRLA, EOMI, no exophthalmos ENT:  moist mucous membranes, no thyromegaly, no cervical lymphadenopathy Cardiovascular: RRR, No MRG Respiratory: CTA B Gastrointestinal: abdomen soft, NT, ND, BS+ Musculoskeletal: no deformities, strength intact in all 4 Skin: moist, warm, no rashes Neurological: no tremor with outstretched hands, DTR normal in all 4  ASSESSMENT: 1. DM2, uncontrolled, insulin-dep, with complications - CAD - CKD - PN - DR  PLAN:  1. Patient with long-standing, uncontrolled DM2, on basal-bolus insulin therapy. Sugars improved before last visit after he stopped working nights >> we did not change his insulin doses. Last A1c was a little better, at 7.5%.  - At this visit, sugars are still fluctuating, but higher in am. He started a tea 1 week ago >> sugars much better (!) He will call me with the name of the tea. We will decrease the insulin doses as he has some low CBGs in the 70s after he started the tea. - I advised him to: Patient Instructions   Please decrease the insulin doses as follows:  Insulin Before breakfast Before lunch Before dinner  Regular 25 with a smaller meal 30 with a regular meal  25 with a smaller meal 30 with a regular meal 25 with a smaller meal 30 with a regular meal  NPH 50 10 60   Call me with the name of the tea!  Please return in 3 months with your sugar log.   - continue checking sugars at different times of the day - check 3-4 times a day, rotating checks - has yearly eye exams >> UTD - given more sugar logs - check HbA1c today >> 7.5% (stable) -Return in about 3 months (around 10/18/2015).

## 2015-07-18 NOTE — Patient Instructions (Signed)
Please decrease the insulin doses as follows:  Insulin Before breakfast Before lunch Before dinner  Regular 25 with a smaller meal 30 with a regular meal  25 with a smaller meal 30 with a regular meal 25 with a smaller meal 30 with a regular meal  NPH 50 10 60   Call me with the name of the tea!  Please return in 3 months with your sugar log.

## 2015-07-29 MED FILL — HumuLIN N 100 UNIT/ML SUSP: 100 | 26 days supply | Qty: 40 | Fill #1

## 2015-07-29 MED FILL — HumuLIN R 100 UNIT/ML SOLN: 100 | 28 days supply | Qty: 30 | Fill #3

## 2015-08-27 MED FILL — HumuLIN N 100 UNIT/ML SUSP: 100 | 26 days supply | Qty: 40 | Fill #2

## 2015-09-11 DIAGNOSIS — H3563 Retinal hemorrhage, bilateral: Secondary | ICD-10-CM | POA: Diagnosis not present

## 2015-09-11 DIAGNOSIS — E113393 Type 2 diabetes mellitus with moderate nonproliferative diabetic retinopathy without macular edema, bilateral: Secondary | ICD-10-CM | POA: Diagnosis not present

## 2015-09-11 DIAGNOSIS — H35043 Retinal micro-aneurysms, unspecified, bilateral: Secondary | ICD-10-CM | POA: Diagnosis not present

## 2015-09-18 ENCOUNTER — Other Ambulatory Visit: Payer: Self-pay | Admitting: Family Medicine

## 2015-09-24 ENCOUNTER — Other Ambulatory Visit: Payer: Self-pay | Admitting: Internal Medicine

## 2015-09-24 MED FILL — HumuLIN N 100 UNIT/ML SUSP: 100 | 26 days supply | Qty: 40 | Fill #3

## 2015-09-24 MED FILL — HumuLIN R 100 UNIT/ML SOLN: 100 | 30 days supply | Qty: 30 | Fill #0

## 2015-10-03 ENCOUNTER — Other Ambulatory Visit: Payer: Self-pay | Admitting: Internal Medicine

## 2015-10-22 MED FILL — HumuLIN N 100 UNIT/ML SUSP: 100 | 26 days supply | Qty: 40 | Fill #4

## 2015-11-14 ENCOUNTER — Encounter: Payer: Self-pay | Admitting: Internal Medicine

## 2015-11-14 ENCOUNTER — Ambulatory Visit (INDEPENDENT_AMBULATORY_CARE_PROVIDER_SITE_OTHER): Payer: Medicare Other | Admitting: Internal Medicine

## 2015-11-14 VITALS — BP 138/80 | HR 76 | Ht 68.5 in | Wt 241.0 lb

## 2015-11-14 DIAGNOSIS — Z794 Long term (current) use of insulin: Secondary | ICD-10-CM

## 2015-11-14 DIAGNOSIS — E1165 Type 2 diabetes mellitus with hyperglycemia: Secondary | ICD-10-CM

## 2015-11-14 DIAGNOSIS — E1159 Type 2 diabetes mellitus with other circulatory complications: Secondary | ICD-10-CM

## 2015-11-14 DIAGNOSIS — IMO0002 Reserved for concepts with insufficient information to code with codable children: Secondary | ICD-10-CM

## 2015-11-14 LAB — POCT GLYCOSYLATED HEMOGLOBIN (HGB A1C): Hemoglobin A1C: 7.8

## 2015-11-14 MED ORDER — INSULIN REGULAR HUMAN (CONC) 500 UNIT/ML ~~LOC~~ SOLN: 50.0000 [IU] | Freq: Three times a day (TID) | SUBCUTANEOUS | Status: DC

## 2015-11-14 NOTE — Patient Instructions (Signed)
Please stop N and R insulin and start U500 insulin: - 16 units on the syringe 30 min before b'fast - 10 units on the syringe 30 min before b'fast - 12 units on the syringe 30 min before dinner  Please let me know if the sugars are consistently <80 or >200.  Please return in 1.5 months with your sugar log.

## 2015-11-14 NOTE — Progress Notes (Addendum)
Patient ID: Eric Choi, male   DOB: 04-10-1941, 75 y.o.   MRN: [id number]  HPI: Eric Choi is a 75 y.o.-year-old male, returning for f/u for DM2 dx 1990, insulin-dependent since 2005, uncontrolled, with complications (CAD, CKD, PN, Eric). Last visit was 4 months ago.  His sugars started to improve after he stopped working night shift.  At last visit, he was telling me that he started to drink a tea (? Name) >> sugars much better then. Now he stopped the tea as too expensive.  Last hemoglobin A1c was: Lab Results  Component Value Date   HGBA1C 7.5 07/18/2015   HGBA1C 7.5 04/18/2015   HGBA1C 7.7 01/15/2015   He is now on insulin N and R:  Insulin Before breakfast Before lunch Before dinner  Regular 20 with a smaller meal 30 with a regular meal  15 with a smaller meal 20 with a regular meal 25 with a smaller meal 30 with a regular meal  NPH 60-70 20 60-70   Pt checks his sugars 3-6x a day: - am90-185, most 132-187 >> 99-165, 190 >> 78, 105-194, 204, 220 >> 134-204 before tea/94-146 after started the tea >> 97-191 - 1-2h after b'fast: 150s >> 118-180 >> 128-209 >> 64x1; 99-178, 184 >> n/c - before lunch: 96-198, 206 >> 87, 131-192, 221 >> 71-161, 197 >> 86-141 (after tea) >> 104-161, 184 - 2h after lunch: 94-287 >> 94-159 >> 203 >> n/c >> 143 (after tea) >> n/c - before dinner:97-165, 175 >> 67, 74-176, 191 >> 87-131, 180 (after tea) >> 61, 85-174 - 1-2h after dinner: 120-125 >> 98-204 >> 93-251 >> 80 >> 73, 80-139, 202 >> n/c - bedtime: <120 >> n/c >> 63, 64, 119-182 >> 105-125 >> 89 >> n/c - nighttime: 53, 81, 161, 172 >> 66-203  >> 68-133 (when working nights - 2-3 nights a week) >> 70-96 >> n/c Has occasional lows: lowest 50s >> 55 >> 70 >> 61 x1; he has hypoglycemia awareness at 90.  Highest sugar was >300s >> 200s >> 204 >> 200s  Pt's meals are: - Breakfast: eggs, sausage, grits, coffee - Lunch: sandwich, crackers - Dinner: meat + veggies + starch - Snacks: 1-2:  including after dinner; pears + mayonnaise, milk + crackers; cake Diet Eric Malachi Bonds  He drives a truck.  - Pt has CKD, last BUN/creatinine:  Lab Results  Component Value Date   BUN 18 03/12/2015   CREATININE 1.54* 03/12/2015  He is on Lisinopril. - last set of lipids: Lab Results  Component Value Date   CHOL 163 03/12/2015   HDL 35.80* 03/12/2015   LDLDIRECT 80.0 03/12/2015   TRIG 378.0* 03/12/2015   CHOLHDL 5 03/12/2015  He is on Zocor. - last eye exam was in 03/21/2015. + Eric. Dr Radene Choi. Had cataracts.  - + numbness and tingling in his feet.  I reviewed his chart and he also has a history of HTN, HL.  Last TSH: Lab Results  Component Value Date   TSH 1.62 03/12/2015   I reviewed pt's medications, allergies, PMH, social hx, family hx, and changes were documented in the history of present illness. Otherwise, unchanged from my initial visit note.  ROS: Constitutional: no weight gain/loss, no fatigue, no subjective hyperthermia/hypothermia Eyes: no blurry vision, no xerophthalmia ENT: no sore throat, no nodules palpated in throat, + occas. Dysphagia/no odynophagia, no hoarseness Cardiovascular: no CP/SOB/palpitations/leg swelling Respiratory: no cough/SOB Gastrointestinal: no N/V/D/C Musculoskeletal: no muscle/joint aches Skin: no rashes Neurological: no tremors/numbness/tingling/dizziness  PE: BP 138/80 mmHg  Pulse 76  Ht 5' 8.5" (1.74 m)  Wt 241 lb (109.317 kg)  BMI 36.11 kg/m2  SpO2 95% Body mass index is 36.11 kg/(m^2).  Wt Readings from Last 3 Encounters:  11/14/15 241 lb (109.317 kg)  07/18/15 240 lb (108.863 kg)  06/13/15 241 lb 14.4 oz (109.725 kg)   Constitutional: overweight, in NAD Eyes: PERRLA, EOMI, no exophthalmos ENT: moist mucous membranes, no thyromegaly, no cervical lymphadenopathy Cardiovascular: RRR, No MRG Respiratory: CTA B Gastrointestinal: abdomen soft, NT, ND, BS+ Musculoskeletal: no deformities, strength intact in all 4 Skin: moist,  warm, no rashes Neurological: no tremor with outstretched hands, DTR normal in all 4  ASSESSMENT: 1. DM2, uncontrolled, insulin-dep, with complications - CAD - CKD - PN - Eric  PLAN:  1. Patient with long-standing, uncontrolled DM2, on basal-bolus insulin therapy. Sugars improved before last visit after he stopped working nights and started to drink a tea >> we decreased the insulin doses as he had some low CBGs in the 70s after he started the tea. He stopped the tea since last visit since this was expensive. Sugars are higher. We will need to increase his insulin doses, however, he is already using more than 200 units insulin the day, so I suggested that we start U500 insulin. He ate his wife agree and I explained how the insulin is to be used. We will use this 3 times a day, will with the largest dose in the morning. Since he has plenty of U100 syringes at home, will use these, and I instructed both patient and his wife about how to draw the insulin in the syringe and how to calculate the dose. We performed several exercises and they appear to have understood well how to inject insulin. I did offer to send U500 pens to the pharmacy, however, the patient prefers vials since less expensive. I gave them 2 coupons for free insulin. - For now, I am going to leave the N and R insulins on his medication list just in case he cannot get the more concentrated insulin. - I advised him to, Use the following doses, however, today, to start with the lowest dose: Patient Instructions  Please stop N and R insulin and start U500 insulin: - 16 units on the syringe 30 min before b'fast - 10 units on the syringe 30 min before lunch - 12 units on the syringe 30 min before dinner  Please let me know if the sugars are consistently <80 or >200.  Please return in 1.5 months with your sugar log.   - continue checking sugars at different times of the day - check 3-4 times a day, rotating checks - has yearly eye exams  >> UTD - check HbA1c today >> 7.8% (higher) -Return in about 3 months (around 02/14/2016).  AddendumArmen Pickup at 11/17/2015 11:16 AM     Status: Signed       Expand All Collapse All   [facility] calling about how expensive the u 500 is its over $1000.   The pt is on [facility] and [employer] so cannot use coupons.   Pharmacist is asking for the rx to be reviewed.       Will need to switch back to N and R but maybe he can restart the tea he has been using:  Insulin Before breakfast Before lunch Before dinner  Regular 20 with a smaller meal 30 with a regular meal  15 with a smaller meal  20 with a regular meal 25 with a smaller meal 30 with a regular meal  NPH 60-70 20 60-70

## 2015-11-17 ENCOUNTER — Telehealth: Payer: Self-pay | Admitting: Internal Medicine

## 2015-11-17 MED ORDER — INSULIN NPH (HUMAN) (ISOPHANE) 100 UNIT/ML ~~LOC~~ SUSP: SUBCUTANEOUS | Status: DC

## 2015-11-17 MED ORDER — INSULIN REGULAR HUMAN 100 UNIT/ML IJ SOLN: 15.0000 [IU] | Freq: Three times a day (TID) | INTRAMUSCULAR | Status: DC

## 2015-11-17 MED FILL — HumuLIN R 100 UNIT/ML SOLN: 100 | 33 days supply | Qty: 30 | Fill #0

## 2015-11-17 MED FILL — HumuLIN N 100 UNIT/ML SUSP: 100 | 31 days supply | Qty: 50 | Fill #0

## 2015-11-17 NOTE — Telephone Encounter (Signed)
[facility] calling about how expensive the u 500 is its over $1000.   The pt is on [facility] and [employer] so cannot use coupons.   Pharmacist is asking for the rx to be reviewed.

## 2015-11-17 NOTE — Telephone Encounter (Signed)
Called patient. Spoke with patient wife about the change back to the N and R insulins. Also asked if he could start his tea back, and stated he needed to watch his diet. Wife agreed and stated she would let him know the changes, and they would start back on the other insulins. No other questions or concerns.

## 2015-11-17 NOTE — Telephone Encounter (Signed)
Eric Choi, can you please call pt: Since the concentrated insulin is too expensive, we can switch back to the N and R insulins as we were using before. I will not change his doses right now, maybe he can restart the Tea that he has been drinking before with very good results in terms of blood sugars. Continue to watch his diet.

## 2015-11-17 NOTE — Addendum Note (Signed)
Addended by: Philemon Kingdom on: 11/17/2015 12:29 PM   Modules accepted: Orders, Medications

## 2015-12-12 ENCOUNTER — Telehealth: Payer: Self-pay | Admitting: Adult Health

## 2015-12-12 ENCOUNTER — Other Ambulatory Visit: Payer: Self-pay | Admitting: Adult Health

## 2015-12-12 ENCOUNTER — Encounter: Payer: Self-pay | Admitting: Adult Health

## 2015-12-12 ENCOUNTER — Ambulatory Visit (INDEPENDENT_AMBULATORY_CARE_PROVIDER_SITE_OTHER): Payer: Medicare Other | Admitting: Adult Health

## 2015-12-12 VITALS — BP 140/78 | Temp 98.3°F | Ht 68.5 in | Wt 239.9 lb

## 2015-12-12 DIAGNOSIS — I10 Essential (primary) hypertension: Secondary | ICD-10-CM

## 2015-12-12 DIAGNOSIS — E669 Obesity, unspecified: Secondary | ICD-10-CM

## 2015-12-12 DIAGNOSIS — E7800 Pure hypercholesterolemia, unspecified: Secondary | ICD-10-CM | POA: Diagnosis not present

## 2015-12-12 LAB — LDL CHOLESTEROL, DIRECT: Direct LDL: 70 mg/dL

## 2015-12-12 LAB — LIPID PANEL
Cholesterol: 184 mg/dL (ref 0–200)
HDL: 37.7 mg/dL — ABNORMAL LOW (ref >39.00)
Total CHOL/HDL Ratio: 5
Triglycerides: 492 mg/dL — ABNORMAL HIGH (ref 0.0–149.0)

## 2015-12-12 MED ORDER — EZETIMIBE 10 MG PO TABS: 10.0000 mg | ORAL_TABLET | Freq: Every day | ORAL | 3 refills | Status: DC

## 2015-12-12 NOTE — Telephone Encounter (Signed)
Spoke to Eric Choi and informed him that his Triglycerides had gone up over 100 points and are almost at 500. I recommended he change his diet drastically and start taking Zetia.   He is ok with this plan.   We will follow up in 3 months

## 2015-12-12 NOTE — Patient Instructions (Signed)
It was great seeing you today  Please work on weight loss through diet and exercise.   I will follow up with you regarding your blood work  I will see you in October for your physical

## 2015-12-12 NOTE — Progress Notes (Signed)
Subjective:    Patient ID: Eric Choi, male    DOB: [birth year], 75 y.o.   MRN: [id number]  HPI  75 year old male who presents to the office today for 6 month follow up regarding hypertension, hyperlipidemia and obesity.   He reports that he is trying to eat healthy and exercise. Neither of these he does on a regular basis.   His blood pressure today in the office today was 140/78  He has seen Dr. Cruzita Lederer 4 weeks ago and his A1c was 7.8  Wt Readings from Last 3 Encounters:  12/12/15 239 lb 14.4 oz (108.8 kg)  11/14/15 241 lb (109.3 kg)  07/18/15 240 lb (108.9 kg)     Review of Systems  Constitutional: Negative.   Respiratory: Negative.   Cardiovascular: Negative.   Genitourinary: Negative.   Hematological: Negative.   All other systems reviewed and are negative.  Past Medical History:  Diagnosis Date  . CAD (coronary artery disease)   . Colon polyp   . DIABETES MELLITUS, TYPE II 11/03/2006  . HYPERLIPIDEMIA 11/03/2006  . HYPERTENSION 11/03/2006    Social History   Social History  . Marital status: Married    Spouse name: N/A  . Number of children: N/A  . Years of education: N/A   Occupational History  . Not on file.   Social History Main Topics  . Smoking status: Former Research scientist (life sciences)  . Smokeless tobacco: Never Used  . Alcohol use No  . Drug use: No  . Sexual activity: Not on file   Other Topics Concern  . Not on file   Social History Narrative   Works 3rd shift   Regular Exercise-yes    Past Surgical History:  Procedure Laterality Date  . ANGIOPLASTY  20 years ago   percutaneous transluminal   . CATARACT EXTRACTION  2002, 2005   bilateral  . PTCA      Family History  Problem Relation Age of Onset  . COPD Father   . Alcohol abuse Father   . Diabetes Father   . Cancer Sister     breast  . Colon cancer Neg Hx     No Known Allergies  Current Outpatient Prescriptions on File Prior to Visit  Medication Sig Dispense Refill  . ACCU-CHEK AVIVA  PLUS test strip Use to test blood sugar 4  times daily as instructed 400 each 1  . amLODipine (NORVASC) 10 MG tablet TAKE ONE TABLET BY MOUTH DAILY 90 tablet 1  . aspirin 81 MG tablet Take 81 mg by mouth daily.      . Blood Glucose Monitoring Suppl (ACCU-CHEK AVIVA PLUS) W/DEVICE KIT Use to check blood sugars 2 times per day. Dx code 250.03 1 kit 0  . insulin NPH Human (HUMULIN N) 100 UNIT/ML injection INJECT 60-70 UNITS BEFORE BREAKFAST, 20 UNITS BEFORE LUNCH, AND 60-70 UNITS BEFORE DINNER 50 mL 3  . insulin regular (HUMULIN R) 100 units/mL injection Inject 0.15-0.3 mLs (15-30 Units total) into the skin 3 (three) times daily before meals. As advised. 30 mL 3  . Lancets Misc. (ACCU-CHEK MULTICLIX LANCET DEV) KIT     . lisinopril (PRINIVIL,ZESTRIL) 20 MG tablet TAKE 1 TABLET (20 MG TOTAL) BY MOUTH DAILY. 90 tablet 3  . metoprolol (LOPRESSOR) 50 MG tablet Take 1 tablet (50 mg total) by mouth daily. 180 tablet 3  . NEEDLE, DISP, 30 G (B-D DISP NEEDLE 30GX1") 30G X 1" MISC by Does not apply route as directed.      Marland Kitchen  simvastatin (ZOCOR) 40 MG tablet TAKE 1 TABLET (40 MG TOTAL) BY MOUTH AT BEDTIME. 90 tablet 3   No current facility-administered medications on file prior to visit.     BP 140/78   Temp 98.3 F (36.8 C) (Oral)   Ht 5' 8.5" (1.74 m)   Wt 239 lb 14.4 oz (108.8 kg)   BMI 35.95 kg/m       Objective:   Physical Exam  Constitutional: He is oriented to person, place, and time. He appears well-developed and well-nourished. No distress.  obese  Cardiovascular: Normal rate, regular rhythm, normal heart sounds and intact distal pulses.  Exam reveals no gallop and no friction rub.   No murmur heard. Pulmonary/Chest: Effort normal and breath sounds normal.  Neurological: He is alert and oriented to person, place, and time.  Skin: Skin is warm and dry. No rash noted. He is not diaphoretic. No erythema. No pallor.  Psychiatric: He has a normal mood and affect. His behavior is normal.  Judgment normal.  Nursing note and vitals reviewed.     Assessment & Plan:  1. Hypercholesterolemia - Lipid panel - Needs to start exercising on a regular basis and eating healthy  2. Essential hypertension - Near goal  - Start monitoring at home  - Consider changing medication in the future.   3. Obesity - Diet and exercise - Portion control   [name], NP

## 2015-12-15 ENCOUNTER — Telehealth: Payer: Self-pay | Admitting: Adult Health

## 2015-12-15 MED ORDER — EZETIMIBE 10 MG PO TABS: 10.0000 mg | ORAL_TABLET | Freq: Every day | ORAL | 3 refills | Status: DC

## 2015-12-15 NOTE — Telephone Encounter (Signed)
Rx sent 

## 2015-12-15 NOTE — Telephone Encounter (Signed)
Pt needs his new rx  ezetimibe (ZETIA) 10 MG tablet   Resent to his regular pharm   [facility] / [facility]  Also would like to know if ok to take with this med with his simvastatin (ZOCOR) 40 MG tablet

## 2015-12-15 NOTE — Telephone Encounter (Signed)
I called the pts wife and informed her the Rx was sent to the [facility] and per Dr Maudie Mercury he can take the two medications together being the triglyceride level is so high; however there is a low risk of side effects of taking these together.  Message forwarded to [facility] to review on Tuesday and Eric Choi was informed that if Eric Choi decides to go anything differently we will call her tomorrow.

## 2015-12-18 MED FILL — HumuLIN N 100 UNIT/ML SUSP: 100 | 31 days supply | Qty: 50 | Fill #1

## 2015-12-18 MED FILL — HumuLIN R 100 UNIT/ML SOLN: 100 | 33 days supply | Qty: 30 | Fill #1

## 2016-01-22 MED FILL — HumuLIN N 100 UNIT/ML SUSP: 100 | 31 days supply | Qty: 50 | Fill #2

## 2016-01-30 ENCOUNTER — Ambulatory Visit: Payer: Medicare Other | Admitting: Internal Medicine

## 2016-02-06 ENCOUNTER — Ambulatory Visit (INDEPENDENT_AMBULATORY_CARE_PROVIDER_SITE_OTHER): Payer: Medicare Other | Admitting: Internal Medicine

## 2016-02-06 ENCOUNTER — Encounter: Payer: Self-pay | Admitting: Internal Medicine

## 2016-02-06 VITALS — BP 132/68 | HR 75 | Ht 68.0 in | Wt 239.0 lb

## 2016-02-06 DIAGNOSIS — Z794 Long term (current) use of insulin: Secondary | ICD-10-CM | POA: Diagnosis not present

## 2016-02-06 DIAGNOSIS — E1159 Type 2 diabetes mellitus with other circulatory complications: Secondary | ICD-10-CM | POA: Diagnosis not present

## 2016-02-06 DIAGNOSIS — IMO0002 Reserved for concepts with insufficient information to code with codable children: Secondary | ICD-10-CM

## 2016-02-06 DIAGNOSIS — E1165 Type 2 diabetes mellitus with hyperglycemia: Secondary | ICD-10-CM | POA: Diagnosis not present

## 2016-02-06 LAB — POCT GLYCOSYLATED HEMOGLOBIN (HGB A1C): Hemoglobin A1C: 7.5

## 2016-02-06 NOTE — Progress Notes (Signed)
Patient ID: Eric Choi, male   DOB: 1940/11/15, 75 y.o.   MRN: [id number]  HPI: Eric Choi is a 75 y.o.-year-old male, returning for f/u for DM2 dx 1990, insulin-dependent since 2005, uncontrolled, with complications (CAD, CKD, PN, Eric). Last visit was 3 months ago.  His sugars started to improve after he stopped working night shift. Occasionally drives at night.  He stopped drinking smoothies yesterday.  Last hemoglobin A1c was: Lab Results  Component Value Date   HGBA1C 7.8 11/14/2015   HGBA1C 7.5 07/18/2015   HGBA1C 7.5 04/18/2015   He is now on insulin N and R. We tried U500 >> too expensive:  Eric Choi at 11/17/2015 11:16 AM     Expand All Collapse All   Eric Choi long pharmacy calling about how expensive the u 500 is its over $1000.   The pt is on [facility] and [employer] so cannot use coupons.   Pharmacist is asking for the rx to be reviewed.       Insulin Before breakfast Before lunch Before dinner  Regular  30 with a regular meal  10 units  30 with a regular meal  NPH 60 20 60   Pt checks his sugars 3-5x a day: - am: 78, 105-194, 204, 220 >> 134-204 before tea/94-146 after started the tea >> 97-191 >> 105-169, 187 - 1-2h after b'fast: 150s >> 118-180 >> 128-209 >> 64x1; 99-178, 184 >> n/c - before lunch: 96-198, 206 >> 87, 131-192, 221 >> 71-161, 197 >> 86-141 (after tea) >> 104-161, 184 >> 60-184, 214 - 2h after lunch: 94-287 >> 94-159 >> 203 >> n/c >> 143 (after tea) >> n/c - before dinner:97-165, 175 >> 67, 74-176, 191 >> 87-131, 180 (after tea) >> 61, 85-174 >>> 67-158, 202, 226 - 1-2h after dinner: 120-125 >> 98-204 >> 93-251 >> 80 >> 73, 80-139, 202 >> n/c - bedtime: <120 >> n/c >> 63, 64, 119-182 >> 105-125 >> 89 >> n/c >> 72, 114 - nighttime: 53, 81, 161, 172 >> 66-203  >> 68-133 (when working nights - 2-3 nights a week) >> 70-96 >> n/c Has occasional lows: lowest 50s >> 55 >> 70 >> 61 x1 >> 60; he has hypoglycemia awareness at 90.  Highest sugar was >300s  >> 200s >> 204 >> 200s >> 200s.  Pt's meals are: - Breakfast: eggs, sausage, grits, coffee - Lunch: sandwich, crackers - Dinner: meat + veggies + starch - Snacks: 1-2: including after dinner; pears + mayonnaise, milk + crackers; cake Diet Eric Eric Choi  He drives a truck.  - Pt has CKD, last BUN/creatinine:  Lab Results  Component Value Date   BUN 18 03/12/2015   CREATININE 1.54 (H) 03/12/2015  He is on Lisinopril. - last set of lipids: Lab Results  Component Value Date   CHOL 184 12/12/2015   HDL 37.70 (L) 12/12/2015   LDLDIRECT 70.0 12/12/2015   TRIG (H) 12/12/2015    492.0 Triglyceride is over 400; calculations on Lipids are invalid.   CHOLHDL 5 12/12/2015  He is on Zocor. - last eye exam was in 03/21/2015. + Eric. Dr Radene Choi. Had cataracts.  - + numbness and tingling in his feet.  I reviewed his chart and he also has a history of HTN, HL.  Last TSH: Lab Results  Component Value Date   TSH 1.62 03/12/2015   I reviewed pt's medications, allergies, PMH, social hx, family hx, and changes were documented in the history of present illness. Otherwise, unchanged from  my initial visit note.  ROS: Constitutional: no weight gain/loss, no fatigue, no subjective hyperthermia/hypothermia Eyes: no blurry vision, no xerophthalmia ENT: no sore throat, no nodules palpated in throat, no dysphagia/no odynophagia, no hoarseness Cardiovascular: no CP/SOB/palpitations/leg swelling Respiratory: no cough/SOB Gastrointestinal: no N/V/D/C Musculoskeletal: no muscle/joint aches Skin: no rashes Neurological: no tremors/numbness/tingling/dizziness  PE: BP 132/68 (BP Location: Left Arm, Patient Position: Sitting)   Pulse 75   Ht '5\' 8"'$  (1.727 m)   Wt 239 lb (108.4 kg)   SpO2 96%   BMI 36.34 kg/m  Body mass index is 36.34 kg/m.  Wt Readings from Last 3 Encounters:  02/06/16 239 lb (108.4 kg)  12/12/15 239 lb 14.4 oz (108.8 kg)  11/14/15 241 lb (109.3 kg)   Constitutional: overweight, in  NAD Eyes: PERRLA, EOMI, no exophthalmos ENT: moist mucous membranes, no thyromegaly, no cervical lymphadenopathy Cardiovascular: RRR, No MRG Respiratory: CTA B Gastrointestinal: abdomen soft, NT, ND, BS+ Musculoskeletal: no deformities, strength intact in all 4 Skin: moist, warm, no rashes Neurological: no tremor with outstretched hands, DTR normal in all 4  ASSESSMENT: 1. DM2, uncontrolled, insulin-dep, with complications - CAD - CKD - PN - Eric  PLAN:  1. Patient with long-standing, uncontrolled DM2, on basal-bolus insulin therapy. Sugars are same as before, but I believe they would improve as he just stopped drinking smoothies. His sugars are above target in am >> will suggest to try to move NPH at bedtime. - at last visit, I suggested U500 insulin >> this was too expensive >> we had to go back to N and R insulin. He describes that he is injecting insulin about his knee (!) and I strongly advised him to use the abdomen. If he cannot use the abdomen, then the next best injection site is the thigh. - I advised him to: Patient Instructions   Please use the abdomen for the insulin injections.  Please try to move the NPH at bedtime: Insulin Before breakfast Before lunch Before dinner Bedtime  Regular 30 units  10 units 30 units -  NPH 60 units 20 units - 60 units   Please return in 3 months with your sugar log.   - continue checking sugars at different times of the day - check 3-4 times a day, rotating checks - has yearly eye exams >> UTD - He prefers to get his flu shot at [facility].  - check HbA1c today >> 7.5% (better) - Return to clinic in 3 months  Eric Kingdom, MD PhD [facility] Endocr[facility]ology

## 2016-02-06 NOTE — Addendum Note (Signed)
Addended by: Caprice Beaver T on: 02/06/2016 10:21 AM   Modules accepted: Orders

## 2016-02-06 NOTE — Patient Instructions (Addendum)
Please use the abdomen for the insulin injections.  Please try to move the NPH at bedtime: Insulin Before breakfast Before lunch Before dinner Bedtime  Regular 30 units  10 units 30 units -  NPH 60 units 20 units - 60 units   Please return in 3 months with your sugar log.

## 2016-02-18 MED FILL — HumuLIN R 100 UNIT/ML SOLN: 100 | 33 days supply | Qty: 30 | Fill #2

## 2016-02-27 ENCOUNTER — Ambulatory Visit: Payer: Medicare Other | Admitting: Internal Medicine

## 2016-03-04 DIAGNOSIS — E113393 Type 2 diabetes mellitus with moderate nonproliferative diabetic retinopathy without macular edema, bilateral: Secondary | ICD-10-CM | POA: Diagnosis not present

## 2016-03-04 DIAGNOSIS — H35043 Retinal micro-aneurysms, unspecified, bilateral: Secondary | ICD-10-CM | POA: Diagnosis not present

## 2016-03-04 DIAGNOSIS — H43822 Vitreomacular adhesion, left eye: Secondary | ICD-10-CM | POA: Diagnosis not present

## 2016-03-04 DIAGNOSIS — H3563 Retinal hemorrhage, bilateral: Secondary | ICD-10-CM | POA: Diagnosis not present

## 2016-03-04 MED FILL — HumuLIN N 100 UNIT/ML SUSP: 100 | 31 days supply | Qty: 50 | Fill #3

## 2016-03-05 ENCOUNTER — Encounter: Payer: Self-pay | Admitting: Adult Health

## 2016-03-05 ENCOUNTER — Ambulatory Visit (INDEPENDENT_AMBULATORY_CARE_PROVIDER_SITE_OTHER): Payer: Medicare Other | Admitting: Adult Health

## 2016-03-05 VITALS — BP 160/74 | Temp 97.5°F | Ht 68.0 in | Wt 237.0 lb

## 2016-03-05 DIAGNOSIS — Z0000 Encounter for general adult medical examination without abnormal findings: Secondary | ICD-10-CM | POA: Diagnosis not present

## 2016-03-05 DIAGNOSIS — IMO0002 Reserved for concepts with insufficient information to code with codable children: Secondary | ICD-10-CM

## 2016-03-05 DIAGNOSIS — Z794 Long term (current) use of insulin: Secondary | ICD-10-CM

## 2016-03-05 DIAGNOSIS — I10 Essential (primary) hypertension: Secondary | ICD-10-CM

## 2016-03-05 DIAGNOSIS — E1159 Type 2 diabetes mellitus with other circulatory complications: Secondary | ICD-10-CM | POA: Diagnosis not present

## 2016-03-05 DIAGNOSIS — E785 Hyperlipidemia, unspecified: Secondary | ICD-10-CM

## 2016-03-05 DIAGNOSIS — E1165 Type 2 diabetes mellitus with hyperglycemia: Secondary | ICD-10-CM

## 2016-03-05 LAB — BASIC METABOLIC PANEL
BUN: 21 mg/dL (ref 6–23)
CO2: 28 mEq/L (ref 19–32)
Calcium: 10 mg/dL (ref 8.4–10.5)
Chloride: 103 mEq/L (ref 96–112)
Creatinine, Ser: 1.56 mg/dL — ABNORMAL HIGH (ref 0.40–1.50)
GFR: 46.51 mL/min — ABNORMAL LOW (ref >60.00)
Glucose, Bld: 170 mg/dL — ABNORMAL HIGH (ref 70–99)
Potassium: 4.5 mEq/L (ref 3.5–5.1)
Sodium: 139 mEq/L (ref 135–145)

## 2016-03-05 LAB — CBC WITH DIFFERENTIAL/PLATELET
Basophils Absolute: 0.1 10*3/uL (ref 0.0–0.1)
Basophils Relative: 0.5 % (ref 0.0–3.0)
Eosinophils Absolute: 0.7 10*3/uL (ref 0.0–0.7)
Eosinophils Relative: 5.2 % — ABNORMAL HIGH (ref 0.0–5.0)
HCT: 46.7 % (ref 39.0–52.0)
Hemoglobin: 15.7 g/dL (ref 13.0–17.0)
Lymphocytes Relative: 22.7 % (ref 12.0–46.0)
Lymphs Abs: 3 10*3/uL (ref 0.7–4.0)
MCHC: 33.6 g/dL (ref 30.0–36.0)
MCV: 87.8 fl (ref 78.0–100.0)
Monocytes Absolute: 1.4 10*3/uL — ABNORMAL HIGH (ref 0.1–1.0)
Monocytes Relative: 10.3 % (ref 3.0–12.0)
Neutro Abs: 8.2 10*3/uL — ABNORMAL HIGH (ref 1.4–7.7)
Neutrophils Relative %: 61.3 % (ref 43.0–77.0)
Platelets: 264 10*3/uL (ref 150.0–400.0)
RBC: 5.32 Mil/uL (ref 4.22–5.81)
RDW: 15 % (ref 11.5–15.5)
WBC: 13.3 10*3/uL — ABNORMAL HIGH (ref 4.0–10.5)

## 2016-03-05 LAB — HEPATIC FUNCTION PANEL
ALT: 18 U/L (ref 0–53)
AST: 14 U/L (ref 0–37)
Albumin: 4.4 g/dL (ref 3.5–5.2)
Alkaline Phosphatase: 49 U/L (ref 39–117)
Bilirubin, Direct: 0.1 mg/dL (ref 0.0–0.3)
Total Bilirubin: 0.5 mg/dL (ref 0.2–1.2)
Total Protein: 7 g/dL (ref 6.0–8.3)

## 2016-03-05 LAB — LIPID PANEL
Cholesterol: 116 mg/dL (ref 0–200)
HDL: 36.2 mg/dL — ABNORMAL LOW (ref >39.00)
LDL Cholesterol: 40 mg/dL (ref 0–99)
NonHDL: 79.77
Total CHOL/HDL Ratio: 3
Triglycerides: 199 mg/dL — ABNORMAL HIGH (ref 0.0–149.0)
VLDL: 39.8 mg/dL (ref 0.0–40.0)

## 2016-03-05 LAB — POC URINALSYSI DIPSTICK (AUTOMATED)
Bilirubin, UA: NEGATIVE
Glucose, UA: NEGATIVE
Ketones, UA: NEGATIVE
Leukocytes, UA: NEGATIVE
Nitrite, UA: NEGATIVE
Spec Grav, UA: 1.015
Urobilinogen, UA: 0.2
pH, UA: 5.5

## 2016-03-05 LAB — TSH: TSH: 2.69 u[IU]/mL (ref 0.35–4.50)

## 2016-03-05 LAB — PSA: PSA: 0.92 ng/mL (ref 0.10–4.00)

## 2016-03-05 NOTE — Patient Instructions (Signed)
It was great seeing you today!  I will follow up with you on your labs.   Continue to work on diet and exercise.   Follow up in one year for your next physical  Health Maintenance, Male A healthy lifestyle and preventative care can promote health and wellness.  Maintain regular health, dental, and eye exams.  Eat a healthy diet. Foods like vegetables, fruits, whole grains, low-fat dairy products, and lean protein foods contain the nutrients you need and are low in calories. Decrease your intake of foods high in solid fats, added sugars, and salt. Get information about a proper diet from your health care provider, if necessary.  Regular physical exercise is one of the most important things you can do for your health. Most adults should get at least 150 minutes of moderate-intensity exercise (any activity that increases your heart rate and causes you to sweat) each week. In addition, most adults need muscle-strengthening exercises on 2 or more days a week.   Maintain a healthy weight. The body mass index (BMI) is a screening tool to identify possible weight problems. It provides an estimate of body fat based on height and weight. Your health care provider can find your BMI and can help you achieve or maintain a healthy weight. For males 20 years and older:  A BMI below 18.5 is considered underweight.  A BMI of 18.5 to 24.9 is normal.  A BMI of 25 to 29.9 is considered overweight.  A BMI of 30 and above is considered obese.  Maintain normal blood lipids and cholesterol by exercising and minimizing your intake of saturated fat. Eat a balanced diet with plenty of fruits and vegetables. Blood tests for lipids and cholesterol should begin at age 34 and be repeated every 5 years. If your lipid or cholesterol levels are high, you are over age 30, or you are at high risk for heart disease, you may need your cholesterol levels checked more frequently.Ongoing high lipid and cholesterol levels should  be treated with medicines if diet and exercise are not working.  If you smoke, find out from your health care provider how to quit. If you do not use tobacco, do not start.  Lung cancer screening is recommended for adults aged 49-80 years who are at high risk for developing lung cancer because of a history of smoking. A yearly low-dose CT scan of the lungs is recommended for people who have at least a 30-pack-year history of smoking and are current smokers or have quit within the past 15 years. A pack year of smoking is smoking an average of 1 pack of cigarettes a day for 1 year (for example, a 30-pack-year history of smoking could mean smoking 1 pack a day for 30 years or 2 packs a day for 15 years). Yearly screening should continue until the smoker has stopped smoking for at least 15 years. Yearly screening should be stopped for people who develop a health problem that would prevent them from having lung cancer treatment.  If you choose to drink alcohol, do not have more than 2 drinks per day. One drink is considered to be 12 oz (360 mL) of beer, 5 oz (150 mL) of wine, or 1.5 oz (45 mL) of liquor.  Avoid the use of street drugs. Do not share needles with anyone. Ask for help if you need support or instructions about stopping the use of drugs.  High blood pressure causes heart disease and increases the risk of stroke. High blood  pressure is more likely to develop in:  People who have blood pressure in the end of the normal range (100-139/85-89 mm Hg).  People who are overweight or obese.  People who are African American.  If you are 88-28 years of age, have your blood pressure checked every 3-5 years. If you are 15 years of age or older, have your blood pressure checked every year. You should have your blood pressure measured twice--once when you are at a hospital or clinic, and once when you are not at a hospital or clinic. Record the average of the two measurements. To check your blood pressure  when you are not at a hospital or clinic, you can use:  An automated blood pressure machine at a pharmacy.  A home blood pressure monitor.  If you are 63-40 years old, ask your health care provider if you should take aspirin to prevent heart disease.  Diabetes screening involves taking a blood sample to check your fasting blood sugar level. This should be done once every 3 years after age 76 if you are at a normal weight and without risk factors for diabetes. Testing should be considered at a younger age or be carried out more frequently if you are overweight and have at least 1 risk factor for diabetes.  Colorectal cancer can be detected and often prevented. Most routine colorectal cancer screening begins at the age of 1 and continues through age 15. However, your health care provider may recommend screening at an earlier age if you have risk factors for colon cancer. On a yearly basis, your health care provider may provide home test kits to check for hidden blood in the stool. A small camera at the end of a tube may be used to directly examine the colon (sigmoidoscopy or colonoscopy) to detect the earliest forms of colorectal cancer. Talk to your health care provider about this at age 24 when routine screening begins. A direct exam of the colon should be repeated every 5-10 years through age 58, unless early forms of precancerous polyps or small growths are found.  People who are at an increased risk for hepatitis B should be screened for this virus. You are considered at high risk for hepatitis B if:  You were born in a country where hepatitis B occurs often. Talk with your health care provider about which countries are considered high risk.  Your parents were born in a high-risk country and you have not received a shot to protect against hepatitis B (hepatitis B vaccine).  You have HIV or AIDS.  You use needles to inject street drugs.  You live with, or have sex with, someone who has  hepatitis B.  You are a man who has sex with other men (MSM).  You get hemodialysis treatment.  You take certain medicines for conditions like cancer, organ transplantation, and autoimmune conditions.  Hepatitis C blood testing is recommended for all people born from [birth year] through 1963 and any individual with known risk factors for hepatitis C.  Healthy men should no longer receive prostate-specific antigen (PSA) blood tests as part of routine cancer screening. Talk to your health care provider about prostate cancer screening.  Testicular cancer screening is not recommended for adolescents or adult males who have no symptoms. Screening includes self-exam, a health care provider exam, and other screening tests. Consult with your health care provider about any symptoms you have or any concerns you have about testicular cancer.  Practice safe sex. Use condoms and avoid  high-risk sexual practices to reduce the spread of sexually transmitted infections (STIs).  You should be screened for STIs, including gonorrhea and chlamydia if:  You are sexually active and are younger than 24 years.  You are older than 24 years, and your health care provider tells you that you are at risk for this type of infection.  Your sexual activity has changed since you were last screened, and you are at an increased risk for chlamydia or gonorrhea. Ask your health care provider if you are at risk.  If you are at risk of being infected with HIV, it is recommended that you take a prescription medicine daily to prevent HIV infection. This is called pre-exposure prophylaxis (PrEP). You are considered at risk if:  You are a man who has sex with other men (MSM).  You are a heterosexual man who is sexually active with multiple partners.  You take drugs by injection.  You are sexually active with a partner who has HIV.  Talk with your health care provider about whether you are at high risk of being infected with HIV. If  you choose to begin PrEP, you should first be tested for HIV. You should then be tested every 3 months for as long as you are taking PrEP.  Use sunscreen. Apply sunscreen liberally and repeatedly throughout the day. You should seek shade when your shadow is shorter than you. Protect yourself by wearing long sleeves, pants, a wide-brimmed hat, and sunglasses year round whenever you are outdoors.  Tell your health care provider of new moles or changes in moles, especially if there is a change in shape or color. Also, tell your health care provider if a mole is larger than the size of a pencil eraser.  A one-time screening for abdominal aortic aneurysm (AAA) and surgical repair of large AAAs by ultrasound is recommended for men aged 58-75 years who are current or former smokers.  Stay current with your vaccines (immunizations).   This information is not intended to replace advice given to you by your health care provider. Make sure you discuss any questions you have with your health care provider.   Document Released: 10/30/2007 Document Revised: 05/24/2014 Document Reviewed: 09/28/2010 Elsevier Interactive Patient Education [employer].

## 2016-03-05 NOTE — Progress Notes (Signed)
Subjective:    Patient ID: Eric Choi, male    DOB: 02-Sep-1940, 75 y.o.   MRN: [id number]  HPI  Patient presents for yearly preventative medicine examination. He is a 75 year old male who  has a past medical history of CAD (coronary artery disease); Colon polyp; DIABETES MELLITUS, TYPE II (11/03/2006); HYPERLIPIDEMIA (11/03/2006); and HYPERTENSION (11/03/2006).   All immunizations and health maintenance protocols were reviewed with the patient and needed orders were placed. He had his yearly flu shot a month ago at [facility].   Appropriate screening laboratory values were ordered for the patient including screening of hyperlipidemia, renal function and hepatic function. If indicated by BPH, a PSA was ordered.  Medication reconciliation,  past medical history, social history, problem list and allergies were reviewed in detail with the patient  Goals were established with regard to weight loss, exercise, and  diet in compliance with medications. He does not follow a diabetic diet. He walks on a treadmill. He would like to start exercising more. He has lost two pounds.   End of life planning was discussed.  He sees Dr. Cruzita Lederer at Endocrinology for uncontrolled diabetes. He last saw her in September at which time his A1c was 7.5. This was an improvement   He is up to date on his colonoscopy, eye exam( yesterday) and dental exam.     Review of Systems  Constitutional: Negative.   HENT: Negative.   Eyes: Negative.   Respiratory: Negative.   Cardiovascular: Negative.   Gastrointestinal: Negative.   Endocrine: Negative.   Genitourinary: Negative.   Musculoskeletal: Negative.   Allergic/Immunologic: Negative.   Neurological: Negative.   Hematological: Negative.   Psychiatric/Behavioral: Negative.    Past Medical History:  Diagnosis Date  . CAD (coronary artery disease)   . Colon polyp   . DIABETES MELLITUS, TYPE II 11/03/2006  . HYPERLIPIDEMIA 11/03/2006  . HYPERTENSION  11/03/2006    Social History   Social History  . Marital status: Married    Spouse name: N/A  . Number of children: N/A  . Years of education: N/A   Occupational History  . Not on file.   Social History Main Topics  . Smoking status: Former Research scientist (life sciences)  . Smokeless tobacco: Never Used  . Alcohol use No  . Drug use: No  . Sexual activity: Not on file   Other Topics Concern  . Not on file   Social History Narrative   Works 3rd shift   Regular Exercise-yes    Past Surgical History:  Procedure Laterality Date  . ANGIOPLASTY  20 years ago   percutaneous transluminal   . CATARACT EXTRACTION  2002, 2005   bilateral  . PTCA      Family History  Problem Relation Age of Onset  . COPD Father   . Alcohol abuse Father   . Diabetes Father   . Cancer Sister     breast  . Colon cancer Neg Hx     No Known Allergies  Current Outpatient Prescriptions on File Prior to Visit  Medication Sig Dispense Refill  . ACCU-CHEK AVIVA PLUS test strip Use to test blood sugar 4  times daily as instructed 400 each 1  . amLODipine (NORVASC) 10 MG tablet TAKE ONE TABLET BY MOUTH DAILY 90 tablet 1  . aspirin 81 MG tablet Take 81 mg by mouth daily.      . Blood Glucose Monitoring Suppl (ACCU-CHEK AVIVA PLUS) W/DEVICE KIT Use to check blood sugars 2 times per  day. Dx code 250.03 1 kit 0  . ezetimibe (ZETIA) 10 MG tablet Take 1 tablet (10 mg total) by mouth daily. 90 tablet 3  . insulin NPH Human (HUMULIN N) 100 UNIT/ML injection INJECT 60-70 UNITS BEFORE BREAKFAST, 20 UNITS BEFORE LUNCH, AND 60-70 UNITS BEFORE DINNER 50 mL 3  . insulin regular (HUMULIN R) 100 units/mL injection Inject 0.15-0.3 mLs (15-30 Units total) into the skin 3 (three) times daily before meals. As advised. 30 mL 3  . Lancets Misc. (ACCU-CHEK MULTICLIX LANCET DEV) KIT     . lisinopril (PRINIVIL,ZESTRIL) 20 MG tablet TAKE 1 TABLET (20 MG TOTAL) BY MOUTH DAILY. 90 tablet 3  . metoprolol (LOPRESSOR) 50 MG tablet Take 1 tablet (50 mg  total) by mouth daily. 180 tablet 3  . NEEDLE, DISP, 30 G (B-D DISP NEEDLE 30GX1") 30G X 1" MISC by Does not apply route as directed.      . simvastatin (ZOCOR) 40 MG tablet TAKE 1 TABLET (40 MG TOTAL) BY MOUTH AT BEDTIME. 90 tablet 3   No current facility-administered medications on file prior to visit.     BP (!) 160/74   Temp 97.5 F (36.4 C) (Oral)   Ht 5' 8" (1.727 m)   Wt 237 lb (107.5 kg)   BMI 36.04 kg/m       Objective:   Physical Exam  Constitutional: He is oriented to person, place, and time. He appears well-developed and well-nourished. No distress.  overweight  HENT:  Head: Normocephalic and atraumatic.  Right Ear: External ear normal.  Left Ear: External ear normal.  Nose: Nose normal.  Mouth/Throat: Uvula is midline, oropharynx is clear and moist and mucous membranes are normal. He has dentures. No oropharyngeal exudate.  Eyes: Conjunctivae and EOM are normal. Pupils are equal, round, and reactive to light. Right eye exhibits no discharge. Left eye exhibits no discharge. No scleral icterus.  Neck: Normal range of motion and full passive range of motion without pain. Neck supple. No JVD present. Carotid bruit is not present. No tracheal deviation present. No thyromegaly present.  Cardiovascular: Normal rate, regular rhythm, normal heart sounds and intact distal pulses.  Exam reveals no gallop and no friction rub.   No murmur heard. Pulmonary/Chest: Effort normal and breath sounds normal. No stridor. No respiratory distress. He has no wheezes. He has no rales. He exhibits no tenderness.  Abdominal: Soft. Bowel sounds are normal. He exhibits no distension and no mass. There is no tenderness. There is no rebound and no guarding.  Genitourinary: Prostate is enlarged. Prostate is not tender.  Musculoskeletal: Normal range of motion. He exhibits no edema, tenderness or deformity.  Lymphadenopathy:    He has no cervical adenopathy.  Neurological: He is alert and oriented  to person, place, and time. He has normal reflexes. He displays normal reflexes. No cranial nerve deficit. He exhibits normal muscle tone. Coordination normal.  Skin: Skin is warm and dry. No rash noted. He is not diaphoretic. No erythema. No pallor.  Psychiatric: He has a normal mood and affect. His behavior is normal. Judgment and thought content normal.  Nursing note and vitals reviewed.     Assessment & Plan:  1. Routine general medical examination at a health care facility  - POCT Urinalysis Dipstick (Automated) - PSA - Basic metabolic panel - CBC with Differential/Platelet - Hepatic function panel - Lipid panel - TSH  2. Essential hypertension - EKG 12-Lead- SR, Rate 77 - POCT Urinalysis Dipstick (Automated) - PSA - Basic  metabolic panel - CBC with Differential/Platelet - Hepatic function panel - Lipid panel - TSH - Blood pressure elevated today. He took his medications just prior to arrival. Has been checking at home and reports BP in the 130-140's 3. Uncontrolled type 2 diabetes mellitus with other circulatory complication, with long-term current use of insulin (HCC)  - POCT Urinalysis Dipstick (Automated) - PSA - Basic metabolic panel - CBC with Differential/Platelet - Hepatic function panel - Lipid panel - TSH - Continue with Endocrinology follow up 4. Hyperlipidemia, unspecified hyperlipidemia type - POCT Urinalysis Dipstick (Automated) - PSA - Basic metabolic panel - CBC with Differential/Platelet - Hepatic function panel - Lipid panel - TSH - Continue with current medication - Needs to start exercising more and eating healthy

## 2016-03-24 ENCOUNTER — Other Ambulatory Visit: Payer: Self-pay | Admitting: Family Medicine

## 2016-03-24 DIAGNOSIS — E785 Hyperlipidemia, unspecified: Secondary | ICD-10-CM

## 2016-04-05 ENCOUNTER — Other Ambulatory Visit: Payer: Self-pay | Admitting: Internal Medicine

## 2016-04-05 MED FILL — HumuLIN R 100 UNIT/ML SOLN: 100 | 33 days supply | Qty: 30 | Fill #3

## 2016-04-09 MED FILL — HumuLIN N 100 UNIT/ML SUSP: 100 | 31 days supply | Qty: 50 | Fill #0

## 2016-04-12 ENCOUNTER — Other Ambulatory Visit: Payer: Self-pay | Admitting: Family Medicine

## 2016-04-12 DIAGNOSIS — E785 Hyperlipidemia, unspecified: Secondary | ICD-10-CM

## 2016-05-03 ENCOUNTER — Other Ambulatory Visit: Payer: Self-pay | Admitting: Internal Medicine

## 2016-05-03 MED FILL — HumuLIN N 100 UNIT/ML SUSP: 100 | 31 days supply | Qty: 50 | Fill #1

## 2016-05-04 MED FILL — HumuLIN R 100 UNIT/ML SOLN: 100 | 33 days supply | Qty: 30 | Fill #0

## 2016-05-12 ENCOUNTER — Other Ambulatory Visit: Payer: Self-pay | Admitting: Internal Medicine

## 2016-06-07 MED FILL — HumuLIN N 100 UNIT/ML SUSP: 100 | 25 days supply | Qty: 40 | Fill #2

## 2016-06-07 MED FILL — HumuLIN R 100 UNIT/ML SOLN: 100 | 33 days supply | Qty: 30 | Fill #1

## 2016-06-11 ENCOUNTER — Ambulatory Visit (INDEPENDENT_AMBULATORY_CARE_PROVIDER_SITE_OTHER): Payer: Medicare Other | Admitting: Internal Medicine

## 2016-06-11 ENCOUNTER — Encounter: Payer: Self-pay | Admitting: Internal Medicine

## 2016-06-11 VITALS — BP 132/82 | HR 79 | Ht 68.0 in | Wt 238.0 lb

## 2016-06-11 DIAGNOSIS — E1159 Type 2 diabetes mellitus with other circulatory complications: Secondary | ICD-10-CM

## 2016-06-11 DIAGNOSIS — Z794 Long term (current) use of insulin: Secondary | ICD-10-CM | POA: Diagnosis not present

## 2016-06-11 DIAGNOSIS — E1165 Type 2 diabetes mellitus with hyperglycemia: Secondary | ICD-10-CM | POA: Diagnosis not present

## 2016-06-11 DIAGNOSIS — IMO0002 Reserved for concepts with insufficient information to code with codable children: Secondary | ICD-10-CM

## 2016-06-11 LAB — POCT GLYCOSYLATED HEMOGLOBIN (HGB A1C): Hemoglobin A1C: 8

## 2016-06-11 NOTE — Patient Instructions (Addendum)
Please try to move the N to bedtime:  Insulin Before breakfast Before lunch Before dinner Bedtime  Regular 30 units  10-15 units 20-30 units -  NPH 60 units 20 units - 60 units   Please return in 3 months with your sugar log.

## 2016-06-11 NOTE — Addendum Note (Signed)
Addended by: Caprice Beaver T on: 06/11/2016 08:41 AM   Modules accepted: Orders

## 2016-06-11 NOTE — Progress Notes (Signed)
Patient ID: Eric Choi, male   DOB: June 29, 1940, 76 y.o.   MRN: [id number]  HPI: Eric Choi is a 76 y.o.-year-old male, returning for f/u for DM2 dx 1990, insulin-dependent since 2005, uncontrolled, with complications (CAD, CKD, PN, DR). Last visit was 3 months ago.  Last hemoglobin A1c was: Lab Results  Component Value Date   HGBA1C 7.5 02/06/2016   HGBA1C 7.8 11/14/2015   HGBA1C 7.5 07/18/2015   He is now on insulin N and R. We tried U500 >> too expensive:  Eric Choi at 11/17/2015 11:16 AM     Expand All Collapse All   Eric Choi long pharmacy calling about how expensive the u 500 is its over $1000.   The pt is on [facility] and [employer] so cannot use coupons.        Insulin Before breakfast Before lunch Before dinner Bedtime  Regular 30 units  10-15 units 25-30 units -  NPH 60 units 20 units 50-60 units (did not move it to bedtime)     Pt checks his sugars 3-5x a day: - am: 134-204 before tea/94-146 after started the tea >> 97-191 >> 105-169, 187 >> 72-188 - 1-2h after b'fast: 150s >> 118-180 >> 128-209 >> 64x1; 99-178, 184 >> n/c - before lunch: 71-161, 197 >> 86-141 (after tea) >> 104-161, 184 >> 60-184, 214 >> 80-164, 183 - 2h after lunch: 94-287 >> 94-159 >> 203 >> n/c >> 143 (after tea) >> n/c - before dinner:87-131, 180 (after tea) >> 61, 85-174 >>> 67-158, 202, 226 >> 58, 75-161, 184 - 1-2h after dinner: 120-125 >> 98-204 >> 93-251 >> 80 >> 73, 80-139, 202 >> n/c - bedtime: <120 >> n/c >> 63, 64, 119-182 >> 105-125 >> 89 >> n/c >> 72, 114 >> 94, 122 - nighttime: 53, 81, 161, 172 >> 66-203  >> 68-133 (when working nights - 2-3 nights a week) >> 70-96 >> n/c >> 51 Has occasional lows: 60 >> 58; he has hypoglycemia awareness at 90.  Highest sugar was 200s >> 194.  Pt's meals are: - Breakfast: eggs, sausage, grits, coffee - Lunch: sandwich, crackers - Dinner: meat + veggies + starch - Snacks: 1-2: including after dinner; pears + mayonnaise, milk + crackers; cake Diet  Dr Malachi Bonds  He drives a truck.  - Pt has CKD, last BUN/creatinine:  Lab Results  Component Value Date   BUN 21 03/05/2016   CREATININE 1.56 (H) 03/05/2016  He is on Lisinopril. - last set of lipids: Lab Results  Component Value Date   CHOL 116 03/05/2016   HDL 36.20 (L) 03/05/2016   LDLCALC 40 03/05/2016   LDLDIRECT 70.0 12/12/2015   TRIG 199.0 (H) 03/05/2016   CHOLHDL 3 03/05/2016  He is on Zocor. - last eye exam was in 03/21/2015. + DR. Dr Radene Ou. Had cataracts.  - + numbness and tingling in his feet.  He also has a history of HTN, HL.  Last TSH: Lab Results  Component Value Date   TSH 2.69 03/05/2016   I reviewed pt's medications, allergies, PMH, social hx, family hx, and changes were documented in the history of present illness. Otherwise, unchanged from my initial visit note.  ROS: Constitutional: no weight gain/loss, no fatigue, no subjective hyperthermia/hypothermia Eyes: no blurry vision, no xerophthalmia ENT: no sore throat, no nodules palpated in throat, no dysphagia/no odynophagia, no hoarseness Cardiovascular: no CP/SOB/palpitations/leg swelling Respiratory: no cough/SOB Gastrointestinal: no N/V/D/C Musculoskeletal: no muscle/joint aches Skin: no rashes Neurological: no tremors/numbness/tingling/dizziness  PE: BP  132/82 (BP Location: Left Arm, Patient Position: Sitting)   Pulse 79   Ht 5\' 8"  (1.727 m)   Wt 238 lb (108 kg)   SpO2 96%   BMI 36.19 kg/m  Body mass index is 36.19 kg/m.  Wt Readings from Last 3 Encounters:  06/11/16 238 lb (108 kg)  03/05/16 237 lb (107.5 kg)  02/06/16 239 lb (108.4 kg)   Constitutional: overweight, in NAD Eyes: PERRLA, EOMI, no exophthalmos ENT: moist mucous membranes, no thyromegaly, no cervical lymphadenopathy Cardiovascular: RRR, No MRG Respiratory: CTA B Gastrointestinal: abdomen soft, NT, ND, BS+ Musculoskeletal: no deformities, strength intact in all 4 Skin: moist, warm, no rashes Neurological: no tremor  with outstretched hands, DTR normal in all 4  ASSESSMENT: 1. DM2, uncontrolled, insulin-dep, with complications - CAD - CKD - PN - DR  - Tried to obtain U500 insulin but it was too expensive >> we had to go back to N and R insulin  PLAN:  1. Patient with long-standing, uncontrolled DM2, on basal-bolus insulin therapy. At last visit, I advised him to stop drinking smoothies and also, as his sugars were above target in am >> we moved NPH at bedtime. At last visit, I also advised him to move his injection site from above his knee (!) to abdomen or thigh.  - Sugars are more variable and he has some lows at night and highs in am as he did not move the NPH at bedtime as advised at last visit. Will do this now. - I advised him to: Patient Instructions   Please try to move the N to bedtime:  Insulin Before breakfast Before lunch Before dinner Bedtime  Regular 30 units  10-15 units 20-30 units -  NPH 60 units 20 units - 60 units   Please return in 3 months with your sugar log.   - continue checking sugars at different times of the day - check 3-4 times a day, rotating checks - has yearly eye exams >> needs a new one >> scheduled - got his flu shot at [facility] - check HbA1c today >> 8% (worse) - Return to clinic in 3 months  Philemon Kingdom, MD PhD [facility] Endocrinology

## 2016-06-13 ENCOUNTER — Other Ambulatory Visit: Payer: Self-pay | Admitting: Family Medicine

## 2016-06-13 DIAGNOSIS — E785 Hyperlipidemia, unspecified: Secondary | ICD-10-CM

## 2016-07-01 ENCOUNTER — Other Ambulatory Visit: Payer: Self-pay | Admitting: Family Medicine

## 2016-07-08 MED FILL — HumuLIN N 100 UNIT/ML SUSP: 100 | 25 days supply | Qty: 40 | Fill #3

## 2016-07-08 MED FILL — HumuLIN R 100 UNIT/ML SOLN: 100 | 33 days supply | Qty: 30 | Fill #2

## 2016-08-04 ENCOUNTER — Other Ambulatory Visit: Payer: Self-pay | Admitting: Internal Medicine

## 2016-08-04 MED FILL — HumuLIN N 100 UNIT/ML SUSP: 100 | 25 days supply | Qty: 40 | Fill #0

## 2016-09-06 MED FILL — HumuLIN R 100 UNIT/ML SOLN: 100 | 33 days supply | Qty: 30 | Fill #3

## 2016-09-06 MED FILL — HumuLIN N 100 UNIT/ML SUSP: 100 | 25 days supply | Qty: 40 | Fill #1

## 2016-09-24 ENCOUNTER — Encounter: Payer: Self-pay | Admitting: Internal Medicine

## 2016-09-24 ENCOUNTER — Ambulatory Visit (INDEPENDENT_AMBULATORY_CARE_PROVIDER_SITE_OTHER): Payer: Medicare Other | Admitting: Internal Medicine

## 2016-09-24 VITALS — BP 140/74 | HR 78 | Ht 68.0 in | Wt 238.0 lb

## 2016-09-24 DIAGNOSIS — Z794 Long term (current) use of insulin: Secondary | ICD-10-CM

## 2016-09-24 DIAGNOSIS — E1159 Type 2 diabetes mellitus with other circulatory complications: Secondary | ICD-10-CM

## 2016-09-24 DIAGNOSIS — IMO0002 Reserved for concepts with insufficient information to code with codable children: Secondary | ICD-10-CM

## 2016-09-24 DIAGNOSIS — E1165 Type 2 diabetes mellitus with hyperglycemia: Secondary | ICD-10-CM | POA: Diagnosis not present

## 2016-09-24 LAB — POCT GLYCOSYLATED HEMOGLOBIN (HGB A1C): Hemoglobin A1C: 8

## 2016-09-24 MED ORDER — DULAGLUTIDE 0.75 MG/0.5ML ~~LOC~~ SOAJ: SUBCUTANEOUS | 1 refills | Status: DC

## 2016-09-24 NOTE — Progress Notes (Signed)
Patient ID: Eric Choi, male   DOB: 06-12-1940, 76 y.o.   MRN: [id number]  HPI: Eric Choi is a 76 y.o.-year-old male, returning for f/u for DM2 dx 1990, insulin-dependent since 2005, uncontrolled, with complications (CAD, CKD, PN, DR). Last visit was 3.5 mo ago.  Last hemoglobin A1c was: Lab Results  Component Value Date   HGBA1C 8.0 06/11/2016   HGBA1C 7.5 02/06/2016   HGBA1C 7.8 11/14/2015   He is now on insulin N and R. We tried U500 >> too expensive:  Insulin Before breakfast Before lunch Before dinner Bedtime  Regular 30 units  10-15 units 20-30 units -  NPH 60 units 20 units - 60 units   Pt checks his sugars 3x a day: - am: 134-204 before tea/94-146 after started the tea >> 97-191 >> 105-169, 187 >> 72-188 >> 101-181 - 1-2h after b'fast: 150s >> 118-180 >> 128-209 >> 64x1; 99-178, 184 >> n/c - before lunch:  86-141 (after tea) >> 104-161, 184 >> 60-184, 214 >> 80-164, 183 >> 74, 106-181 - 2h after lunch: 94-287 >> 94-159 >> 203 >> n/c >> 143 (after tea) >> n/c - before dinner: 61, 85-174 >>> 67-158, 202, 226 >> 58, 75-161, 184 >> 79, 116-176, 181 - 1-2h after dinner: 120-125 >> 98-204 >> 93-251 >> 80 >> 73, 80-139, 202 >> n/c - bedtime: <120 >> n/c >> 63, 64, 119-182 >> 105-125 >> 89 >> n/c >> 72, 114 >> 94, 122 >> n/c - nighttime:  68-133 (when working nights - 2-3 nights a week) >> 70-96 >> n/c >> 51 >> n/c Has occasional lows: 60 >> 58 >> 70s; he has hypoglycemia awareness at 90.  Highest sugar was 200s >> 194 >> 181.  Pt's meals are: - Breakfast: eggs, sausage, grits, coffee - Lunch: sandwich, crackers - Dinner: meat + veggies + starch - Snacks: 1-2: including after dinner; pears + mayonnaise, milk + crackers; cake  He drives a truck.  - Pt has CKD, last BUN/creatinine:  Lab Results  Component Value Date   BUN 21 03/05/2016   CREATININE 1.56 (H) 03/05/2016  He is on Lisinopril. - last set of lipids: Lab Results  Component Value Date   CHOL 116  03/05/2016   HDL 36.20 (L) 03/05/2016   LDLCALC 40 03/05/2016   LDLDIRECT 70.0 12/12/2015   TRIG 199.0 (H) 03/05/2016   CHOLHDL 3 03/05/2016  He is on Zocor. - last eye exam was in 2017. + DR. Dr Radene Ou. Had cataracts.  - He has numbness and tingling in his feet.  He also has a history of HTN, HL.  Last TSH: Lab Results  Component Value Date   TSH 2.69 03/05/2016   ROS: Constitutional: no weight gain/no weight loss, no fatigue, no subjective hyperthermia, no subjective hypothermia Eyes: no blurry vision, no xerophthalmia ENT: no sore throat, no nodules palpated in throat, no dysphagia, no odynophagia, no hoarseness Cardiovascular: no CP/no SOB/no palpitations/no leg swelling Respiratory: no cough/no SOB/no wheezing Gastrointestinal: no N/no V/no D/no C/no acid reflux Musculoskeletal: no muscle aches/no joint aches Skin: no rashes, no hair loss Neurological: no tremors/no numbness/no tingling/no dizziness  I reviewed pt's medications, allergies, PMH, social hx, family hx, and changes were documented in the history of present illness. Otherwise, unchanged from my initial visit note.  PE: BP 140/74 (BP Location: Left Arm, Patient Position: Sitting)   Pulse 78   Ht 5\' 8"  (1.727 m)   Wt 238 lb (108 kg)   SpO2 95%  BMI 36.19 kg/m   Body mass index is 36.19 kg/m.  Wt Readings from Last 3 Encounters:  09/24/16 238 lb (108 kg)  06/11/16 238 lb (108 kg)  03/05/16 237 lb (107.5 kg)   Constitutional: overweight, in NAD Eyes: PERRLA, EOMI, no exophthalmos ENT: moist mucous membranes, no thyromegaly, no cervical lymphadenopathy Cardiovascular: RRR, No MRG Respiratory: CTA B Gastrointestinal: abdomen soft, NT, ND, BS+ Musculoskeletal: no deformities, strength intact in all 4 Skin: moist, warm, no rashes Neurological: no tremor with outstretched hands, DTR normal in all 4  ASSESSMENT: 1. DM2, uncontrolled, insulin-dep, with complications - CAD - CKD - PN - DR  - Tried  to obtain U500 insulin but it was too expensive >> we had to go back to N and R insulin  PLAN:  1. Patient with long-standing, uncontrolled DM2, on basal-bolus insulin tx. His sugars are still above target and we cannot increase insulin as he had lows at night >> we will try to add Trulicity. I advised him to let me know when we need to increase the dose. - I advised him to: Patient Instructions   Please continue:  Insulin Before breakfast Before lunch Before dinner Bedtime  Regular 30 units  10-15 units 20-30 units -  NPH 60 units 20 units - 60 units   Please start Trulicity 7.79 mg weekly. Let me know when you are close to running out to call in the higher dose to your pharmacy (1.5 mg).  Please return in 3 months with your sugar log.   - today, HbA1c is 8.0% (stable) - continue checking sugars at different times of the day - check 3-4x a day, rotating checks - advised for yearly eye exams >> he is UTD, will have a new one soon - Return to clinic in 3 mo with sugar log   Philemon Kingdom, MD PhD [facility] Endocrinology

## 2016-09-24 NOTE — Patient Instructions (Addendum)
Please continue:  Insulin Before breakfast Before lunch Before dinner Bedtime  Regular 30 units  10-15 units 20-30 units -  NPH 60 units 20 units - 60 units   Please start Trulicity 2.11 mg weekly. Let me know when you are close to running out to call in the higher dose to your pharmacy (1.5 mg).  Please return in 3 months with your sugar log.

## 2016-09-24 NOTE — Addendum Note (Signed)
Addended by: Caprice Beaver T on: 09/24/2016 10:57 AM   Modules accepted: Orders

## 2016-10-06 ENCOUNTER — Other Ambulatory Visit: Payer: Self-pay | Admitting: Internal Medicine

## 2016-10-06 MED FILL — HumuLIN R 100 UNIT/ML SOLN: 100 | 33 days supply | Qty: 30 | Fill #0

## 2016-10-06 MED FILL — HumuLIN N 100 UNIT/ML SUSP: 100 | 25 days supply | Qty: 40 | Fill #2

## 2016-11-04 MED FILL — HumuLIN N 100 UNIT/ML SUSP: 100 | 25 days supply | Qty: 40 | Fill #3

## 2016-12-09 DIAGNOSIS — H35043 Retinal micro-aneurysms, unspecified, bilateral: Secondary | ICD-10-CM | POA: Diagnosis not present

## 2016-12-09 DIAGNOSIS — H43821 Vitreomacular adhesion, right eye: Secondary | ICD-10-CM | POA: Diagnosis not present

## 2016-12-09 DIAGNOSIS — E113393 Type 2 diabetes mellitus with moderate nonproliferative diabetic retinopathy without macular edema, bilateral: Secondary | ICD-10-CM | POA: Diagnosis not present

## 2016-12-09 DIAGNOSIS — H43822 Vitreomacular adhesion, left eye: Secondary | ICD-10-CM | POA: Diagnosis not present

## 2016-12-09 DIAGNOSIS — H3563 Retinal hemorrhage, bilateral: Secondary | ICD-10-CM | POA: Diagnosis not present

## 2016-12-09 MED FILL — HumuLIN N 100 UNIT/ML SUSP: 100 | 25 days supply | Qty: 40 | Fill #4

## 2016-12-27 ENCOUNTER — Other Ambulatory Visit: Payer: Self-pay | Admitting: Adult Health

## 2016-12-28 ENCOUNTER — Telehealth: Payer: Self-pay | Admitting: Family Medicine

## 2016-12-28 NOTE — Telephone Encounter (Signed)
Pt due for cpx on or after 03/05/17.  Please get him on the schedule and have him come fasting for labs.  Thanks!!

## 2016-12-30 NOTE — Telephone Encounter (Signed)
Pt has been scheduled.  °

## 2017-01-04 ENCOUNTER — Other Ambulatory Visit: Payer: Self-pay | Admitting: Internal Medicine

## 2017-01-04 MED FILL — HumuLIN R 100 UNIT/ML SOLN: 100 | 33 days supply | Qty: 30 | Fill #1

## 2017-01-07 ENCOUNTER — Encounter: Payer: Self-pay | Admitting: Internal Medicine

## 2017-01-07 ENCOUNTER — Ambulatory Visit (INDEPENDENT_AMBULATORY_CARE_PROVIDER_SITE_OTHER): Payer: Medicare Other | Admitting: Internal Medicine

## 2017-01-07 ENCOUNTER — Other Ambulatory Visit: Payer: Self-pay | Admitting: Family Medicine

## 2017-01-07 VITALS — BP 132/72 | HR 74 | Ht 67.0 in | Wt 236.0 lb

## 2017-01-07 DIAGNOSIS — E1165 Type 2 diabetes mellitus with hyperglycemia: Secondary | ICD-10-CM | POA: Diagnosis not present

## 2017-01-07 DIAGNOSIS — Z794 Long term (current) use of insulin: Secondary | ICD-10-CM

## 2017-01-07 DIAGNOSIS — E1159 Type 2 diabetes mellitus with other circulatory complications: Secondary | ICD-10-CM

## 2017-01-07 DIAGNOSIS — IMO0002 Reserved for concepts with insufficient information to code with codable children: Secondary | ICD-10-CM

## 2017-01-07 DIAGNOSIS — E785 Hyperlipidemia, unspecified: Secondary | ICD-10-CM

## 2017-01-07 LAB — POCT GLYCOSYLATED HEMOGLOBIN (HGB A1C): Hemoglobin A1C: 7.4

## 2017-01-07 NOTE — Patient Instructions (Addendum)
  Please continue:  Insulin Before breakfast Before lunch Before dinner Bedtime  Regular 25-30 units  10-15 units 25-30 units -  NPH 50-60 units 20 units - 50-60 units   Keep up the good job.  Look up "The Engine 2 diet" - by Christa See.  Please come back for a follow-up appointment in 3 months

## 2017-01-07 NOTE — Progress Notes (Signed)
Patient ID: Eric Choi, male   DOB: [birth year], 76 y.o.   MRN: [id number]  HPI: Eric Choi is a 76 y.o.-year-old male, returning for f/u for DM2 dx 1990, insulin-dependent since 2005, uncontrolled, with complications (CAD, CKD, PN, DR). Last visit was 3.5 mo ago.  He read about a plant-based diet (recommended at last visit) >> started >> sugars better in last month.  Last hemoglobin A1c was: Lab Results  Component Value Date   HGBA1C 8.0 09/24/2016   HGBA1C 8.0 06/11/2016   HGBA1C 7.5 02/06/2016   He is now on insulin N and R. We tried U500 >> too expensive:   Insulin Before breakfast Before lunch Before dinner Bedtime  Regular 25-30 units  10-15 units 25-30 units -  NPH 50-60 units 20 units - 50-60 units   He did not start Trulicity.  Pt checks his sugars 3x a day: - am:  105-169, 187 >> 72-188 >> 101-181 >> 106-197, 233 (better in last mo) - 1-2h after b'fast: 128-209 >> 64x1; 99-178, 184 >> n/c >> 97-191, 210 - before lunch: 60-184, 214 >> 80-164, 183 >> 74, 106-181 >> 89-162, 184 - 2h after lunch: 203 >> n/c >> 143 (after tea) >> n/c >> 67 x1, 71-126 - before dinner: 58, 75-161, 184 >> 79, 116-176, 181 >> 61 x1, 90-173, 221 - 1-2h after dinner: 93-251 >> 80 >> 73, 80-139, 202 >> n/c >> 121-177, 200 - bedtime: 89 >> n/c >> 72, 114 >> 94, 122 >> n/c >> 78, 127-203, 245 - nighttime:   70-96 >> n/c >> 51 >> n/c  Lowest CBG: 60 >> 58 >> 70s >> 61; he has hypoglycemia awareness at 90.  Highest CBG:200s >> 194 >> 181 >> 245.  Pt's meals are: - Breakfast: eggs, sausage, grits, coffee - Lunch: sandwich, crackers - Dinner: meat + veggies + starch - Snacks: 1-2: including after dinner; pears + mayonnaise, milk + crackers; cake  He drives a truck.  - Pt has CKD, last BUN/creatinine:  Lab Results  Component Value Date   BUN 21 03/05/2016   CREATININE 1.56 (H) 03/05/2016  On Lisinopril. - last set of lipids: Lab Results  Component Value Date   CHOL 116 03/05/2016   HDL 36.20 (L) 03/05/2016   LDLCALC 40 03/05/2016   LDLDIRECT 70.0 12/12/2015   TRIG 199.0 (H) 03/05/2016   CHOLHDL 3 03/05/2016  On Zocor. - last eye exam was in 12/15/2016 >> + DR (report pending). Dr Radene Ou. + cataracts - he has numbness and tingling in his feet.  He also has a history of HTN, HL.  Last TSH: Lab Results  Component Value Date   TSH 2.69 03/05/2016   ROS: Constitutional: no weight gain/no weight loss, no fatigue, no subjective hyperthermia, no subjective hypothermia Eyes: no blurry vision, no xerophthalmia ENT: no sore throat, no nodules palpated in throat, no dysphagia, no odynophagia, no hoarseness Cardiovascular: no CP/no SOB/no palpitations/no leg swelling Respiratory: no cough/no SOB/no wheezing Gastrointestinal: no N/no V/no D/no C/no acid reflux Musculoskeletal: no muscle aches/no joint aches Skin: no rashes, no hair loss Neurological: no tremors/+ numbness/+ tingling/no dizziness  I reviewed pt's medications, allergies, PMH, social hx, family hx, and changes were documented in the history of present illness. Otherwise, unchanged from my initial visit note.  PE: BP 132/72 (BP Location: Left Arm, Patient Position: Sitting)   Pulse 74   Ht 5\' 7"  (1.702 m)   Wt 236 lb (107 kg)   SpO2 96%   BMI  36.96 kg/m  Body mass index is 36.96 kg/m.  Wt Readings from Last 3 Encounters:  01/07/17 236 lb (107 kg)  09/24/16 238 lb (108 kg)  06/11/16 238 lb (108 kg)   Constitutional: obese, in NAD Eyes: PERRLA, EOMI, no exophthalmos ENT: moist mucous membranes, no thyromegaly, no cervical lymphadenopathy Cardiovascular: RRR, No MRG Respiratory: CTA B Gastrointestinal: abdomen soft, NT, ND, BS+ Musculoskeletal: no deformities, strength intact in all 4 Skin: moist, warm, no rashes Neurological: no tremor with outstretched hands, DTR normal in all 4   ASSESSMENT: 1. DM2, uncontrolled, insulin-dep, with complications - CAD - CKD - PN - DR  - Tried to  obtain U500 insulin but this was too expensive >> we had to go back to N and R insulin  2. HL  PLAN:  1. Patient with long-standing, Uncontrolled type 2 diabetes, on basal-bolus insulin treatment, to which I advised him to add Trulicity at last visit. At that time, since he had occasional lows at night, we could not increase his insulin doses. He did not start Trulicity but started to transition to a plant-based diet >> sugars are better in last month >> strongly advised him to continue! If he does, will start decreasing insulin doses at last visit. For now, he is going to try lower doses depending on his meals. - I advised him to: Patient Instructions   Please continue:  Insulin Before breakfast Before lunch Before dinner Bedtime  Regular 25-30 units  10-15 units 25-30 units -  NPH 50-60 units 20 units - 50-60 units   Keep up the good job.  Look up "The Engine 2 diet" - by Eric Choi.  Please come back for a follow-up appointment in 3 months  - today, HbA1c is 7.4% (better) - continue checking sugars at different times of the day - check 3x a day, rotating checks - advised for yearly eye exams >> he is UTD - Return to clinic in 3 mo with sugar log    2. HL - reviewed last 2 lipid panel >> improved LDL - he is almost due for a repeat Lipid panel >> has APE with PCP in 2 mo - continue Zocor for now - a plant-based diet will greatly help if he continues   Eric Kingdom, MD PhD [facility] Endocrinology

## 2017-01-07 NOTE — Addendum Note (Signed)
Addended by: Caprice Beaver T on: 01/07/2017 09:00 AM   Modules accepted: Orders

## 2017-01-24 ENCOUNTER — Other Ambulatory Visit: Payer: Self-pay | Admitting: Family Medicine

## 2017-01-24 DIAGNOSIS — E785 Hyperlipidemia, unspecified: Secondary | ICD-10-CM

## 2017-02-01 ENCOUNTER — Other Ambulatory Visit: Payer: Self-pay | Admitting: Internal Medicine

## 2017-02-01 MED FILL — HumuLIN N 100 UNIT/ML SUSP: 100 | 25 days supply | Qty: 40 | Fill #0

## 2017-02-01 MED FILL — HumuLIN R 100 UNIT/ML SOLN: 100 | 33 days supply | Qty: 30 | Fill #2

## 2017-02-02 ENCOUNTER — Other Ambulatory Visit: Payer: Self-pay

## 2017-02-02 ENCOUNTER — Telehealth: Payer: Self-pay | Admitting: Internal Medicine

## 2017-02-02 MED ORDER — ACCU-CHEK AVIVA PLUS W/DEVICE KIT: PACK | 0 refills | Status: DC

## 2017-02-02 NOTE — Telephone Encounter (Signed)
Called and advised with pharmacy of the patient needed the meter.

## 2017-02-02 NOTE — Telephone Encounter (Signed)
Routing to you °

## 2017-02-02 NOTE — Telephone Encounter (Signed)
Wants to know if they need to dispense supplies for the accu chek script?  They said they already have the script.  Ty,  -LL

## 2017-02-02 NOTE — Telephone Encounter (Signed)
Patient's wife called to advise that the patient lost his Blood Glucose Monitoring Suppl (ACCU-CHEK AVIVA PLUS) W/DEVICE KIT and needs another meter called into the pharmacy.  [facility] [place], [place]    Call patient if there are any additional questions.

## 2017-02-02 NOTE — Telephone Encounter (Signed)
Called in the RX.

## 2017-02-09 ENCOUNTER — Other Ambulatory Visit: Payer: Self-pay

## 2017-02-09 MED ORDER — ACCU-CHEK AVIVA PLUS W/DEVICE KIT: PACK | 0 refills | Status: DC

## 2017-02-09 MED ORDER — ACCU-CHEK MULTICLIX LANCET DEV KIT: PACK | 5 refills | Status: DC

## 2017-02-09 MED ORDER — GLUCOSE BLOOD VI STRP: ORAL_STRIP | 5 refills | Status: DC

## 2017-02-26 MED FILL — HumuLIN N 100 UNIT/ML SUSP: 100 | 25 days supply | Qty: 40 | Fill #1

## 2017-02-28 MED FILL — HumuLIN R 100 UNIT/ML SOLN: 100 | 33 days supply | Qty: 30 | Fill #3

## 2017-03-11 ENCOUNTER — Ambulatory Visit (INDEPENDENT_AMBULATORY_CARE_PROVIDER_SITE_OTHER): Payer: Medicare Other | Admitting: Adult Health

## 2017-03-11 ENCOUNTER — Encounter: Payer: Self-pay | Admitting: Adult Health

## 2017-03-11 VITALS — BP 150/70 | Temp 97.9°F | Ht 68.0 in | Wt 236.0 lb

## 2017-03-11 DIAGNOSIS — E785 Hyperlipidemia, unspecified: Secondary | ICD-10-CM

## 2017-03-11 DIAGNOSIS — N401 Enlarged prostate with lower urinary tract symptoms: Secondary | ICD-10-CM

## 2017-03-11 DIAGNOSIS — Z0000 Encounter for general adult medical examination without abnormal findings: Secondary | ICD-10-CM

## 2017-03-11 DIAGNOSIS — R351 Nocturia: Secondary | ICD-10-CM | POA: Diagnosis not present

## 2017-03-11 DIAGNOSIS — R252 Cramp and spasm: Secondary | ICD-10-CM

## 2017-03-11 DIAGNOSIS — E1165 Type 2 diabetes mellitus with hyperglycemia: Secondary | ICD-10-CM | POA: Diagnosis not present

## 2017-03-11 DIAGNOSIS — E1159 Type 2 diabetes mellitus with other circulatory complications: Secondary | ICD-10-CM | POA: Diagnosis not present

## 2017-03-11 DIAGNOSIS — Z794 Long term (current) use of insulin: Secondary | ICD-10-CM

## 2017-03-11 DIAGNOSIS — I10 Essential (primary) hypertension: Secondary | ICD-10-CM | POA: Diagnosis not present

## 2017-03-11 DIAGNOSIS — IMO0002 Reserved for concepts with insufficient information to code with codable children: Secondary | ICD-10-CM

## 2017-03-11 LAB — CBC WITH DIFFERENTIAL/PLATELET
Basophils Absolute: 0.1 10*3/uL (ref 0.0–0.1)
Basophils Relative: 0.9 % (ref 0.0–3.0)
Eosinophils Absolute: 0.6 10*3/uL (ref 0.0–0.7)
Eosinophils Relative: 5.1 % — ABNORMAL HIGH (ref 0.0–5.0)
HCT: 46.6 % (ref 39.0–52.0)
Hemoglobin: 15.4 g/dL (ref 13.0–17.0)
Lymphocytes Relative: 21.9 % (ref 12.0–46.0)
Lymphs Abs: 2.7 10*3/uL (ref 0.7–4.0)
MCHC: 33 g/dL (ref 30.0–36.0)
MCV: 90.3 fl (ref 78.0–100.0)
Monocytes Absolute: 1.1 10*3/uL — ABNORMAL HIGH (ref 0.1–1.0)
Monocytes Relative: 9.2 % (ref 3.0–12.0)
Neutro Abs: 7.7 10*3/uL (ref 1.4–7.7)
Neutrophils Relative %: 62.9 % (ref 43.0–77.0)
Platelets: 303 10*3/uL (ref 150.0–400.0)
RBC: 5.17 Mil/uL (ref 4.22–5.81)
RDW: 15.1 % (ref 11.5–15.5)
WBC: 12.3 10*3/uL — ABNORMAL HIGH (ref 4.0–10.5)

## 2017-03-11 LAB — BASIC METABOLIC PANEL
BUN: 17 mg/dL (ref 6–23)
CO2: 28 mEq/L (ref 19–32)
Calcium: 9.9 mg/dL (ref 8.4–10.5)
Chloride: 101 mEq/L (ref 96–112)
Creatinine, Ser: 1.31 mg/dL (ref 0.40–1.50)
GFR: 56.73 mL/min — ABNORMAL LOW (ref >60.00)
Glucose, Bld: 241 mg/dL — ABNORMAL HIGH (ref 70–99)
Potassium: 4.7 mEq/L (ref 3.5–5.1)
Sodium: 138 mEq/L (ref 135–145)

## 2017-03-11 LAB — LIPID PANEL
Cholesterol: 147 mg/dL (ref 0–200)
HDL: 37.9 mg/dL — ABNORMAL LOW (ref >39.00)
NonHDL: 109.59
Total CHOL/HDL Ratio: 4
Triglycerides: 242 mg/dL — ABNORMAL HIGH (ref 0.0–149.0)
VLDL: 48.4 mg/dL — ABNORMAL HIGH (ref 0.0–40.0)

## 2017-03-11 LAB — HEPATIC FUNCTION PANEL
ALT: 18 U/L (ref 0–53)
AST: 14 U/L (ref 0–37)
Albumin: 4.4 g/dL (ref 3.5–5.2)
Alkaline Phosphatase: 54 U/L (ref 39–117)
Bilirubin, Direct: 0.1 mg/dL (ref 0.0–0.3)
Total Bilirubin: 0.5 mg/dL (ref 0.2–1.2)
Total Protein: 7 g/dL (ref 6.0–8.3)

## 2017-03-11 LAB — PSA: PSA: 1.16 ng/mL (ref 0.10–4.00)

## 2017-03-11 LAB — LDL CHOLESTEROL, DIRECT: Direct LDL: 73 mg/dL

## 2017-03-11 LAB — TSH: TSH: 2.9 u[IU]/mL (ref 0.35–4.50)

## 2017-03-11 NOTE — Progress Notes (Signed)
Subjective:    Patient ID: Eric Choi, male    DOB: 1940-10-15, 76 y.o.   MRN: [id number]  HPI Patient presents for yearly preventative medicine examination. He is a pleasant 76 year old male who  has a past medical history of CAD (coronary artery disease); Colon polyp; DIABETES MELLITUS, TYPE II (11/03/2006); HYPERLIPIDEMIA (11/03/2006); and HYPERTENSION (11/03/2006).  He currently takes Norvasc 10 mg, lisinopril 20 mg , and Metoprolol 50 mg daily for hypertension. Today his BP is not at goal, he does report that he took his BP medication just prior to arrival.    He is followed by endocrinology for uncontrolled diabetes. His current regimen includes that of Humalog. His last A1c was 7.4 in August 2018. He was taking Trulicity but could not afford it.   He also has a history of hyperlipidemia for which he takes Zocor 40 mg. He was prescribed Zetia during the last CPE but he stopped taking it about 2 months ago as he felt that is was making him weak. Since he stopped taking it he has rebound nicely.   All immunizations and health maintenance protocols were reviewed with the patient and needed orders were placed.  Appropriate screening laboratory values were ordered for the patient including screening of hyperlipidemia, renal function and hepatic function. If indicated by BPH, a PSA was ordered.  Medication reconciliation,  past medical history, social history, problem list and allergies were reviewed in detail with the patient  Goals were established with regard to weight loss, exercise, and  diet in compliance with medications  Wt Readings from Last 3 Encounters:  03/11/17 236 lb (107 kg)  01/07/17 236 lb (107 kg)  09/24/16 238 lb (108 kg)    He is up to date on his colonoscopy. 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  Review of Systems  Constitutional: Negative.   HENT: Negative.   Eyes: Negative.   Respiratory:  Negative.   Cardiovascular: Negative.   Gastrointestinal: Negative.   Endocrine: Negative.   Genitourinary: Negative.   Musculoskeletal: Positive for arthralgias and back pain.  Skin: Negative.   Allergic/Immunologic: Negative.   Neurological: Negative.   Hematological: Negative.   Psychiatric/Behavioral: Negative.   All other systems reviewed and are negative.  Past Medical History:  Diagnosis Date  . CAD (coronary artery disease)   . Colon polyp   . DIABETES MELLITUS, TYPE II 11/03/2006  . HYPERLIPIDEMIA 11/03/2006  . HYPERTENSION 11/03/2006    Social History   Social History  . Marital status: Married    Spouse name: N/A  . Number of children: N/A  . Years of education: N/A   Occupational History  . Not on file.   Social History Main Topics  . Smoking status: Former Research scientist (life sciences)  . Smokeless tobacco: Never Used  . Alcohol use No  . Drug use: No  . Sexual activity: Not on file   Other Topics Concern  . Not on file   Social History Narrative   Works 3rd shift   Regular Exercise-yes    Past Surgical History:  Procedure Laterality Date  . ANGIOPLASTY  20 years ago   percutaneous transluminal   . CATARACT EXTRACTION  2002, 2005   bilateral  . PTCA      Family History  Problem Relation Age of Onset  . COPD Father   . Alcohol abuse Father   . Diabetes Father   .  Cancer Sister        breast  . Colon cancer Neg Hx     No Known Allergies  Current Outpatient Prescriptions on File Prior to Visit  Medication Sig Dispense Refill  . amLODipine (NORVASC) 10 MG tablet TAKE ONE TABLET BY MOUTH DAILY 90 tablet 4  . aspirin 81 MG tablet Take 81 mg by mouth daily.      . Blood Glucose Monitoring Suppl (ACCU-CHEK AVIVA PLUS) w/Device KIT Use to check blood sugars 4 times per day. Dx code 250.03 1 kit 0  . glucose blood (ACCU-CHEK AVIVA PLUS) test strip USE TO TEST BLOOD SUGAR 4  TIMES DAILY AS INSTRUCTED 400 each 5  . HUMULIN N 100 UNIT/ML injection INJECT 60-70 UNITS  BEFORE BREAKFAST, 20 UNITS BEFORE LUNCH, AND 60-70 UNITS BEFORE DINNER 50 mL 3  . HUMULIN R 100 UNIT/ML injection INJECT 15-30 UNITS TOTAL INTO THE SKIN 3 TIMES DAILY BEFORE MEALS AS ADVISED. 30 mL 3  . Lancets Misc. (ACCU-CHEK MULTICLIX LANCET DEV) KIT Use to check sugar 4 times daily 400 each 5  . lisinopril (PRINIVIL,ZESTRIL) 20 MG tablet TAKE 1 TABLET (20 MG TOTAL) BY MOUTH DAILY. 90 tablet 4  . metoprolol tartrate (LOPRESSOR) 50 MG tablet TAKE 1 TABLET (50 MG TOTAL) BY MOUTH DAILY. 90 tablet 1  . NEEDLE, DISP, 30 G (B-D DISP NEEDLE 30GX1") 30G X 1" MISC by Does not apply route as directed.      . simvastatin (ZOCOR) 40 MG tablet TAKE 1 TABLET (40 MG TOTAL) BY MOUTH AT BEDTIME. 90 tablet 2   No current facility-administered medications on file prior to visit.     BP (!) 150/70 (BP Location: Left Arm)   Temp 97.9 F (36.6 C) (Oral)   Ht 5' 8"  (1.727 m)   Wt 236 lb (107 kg)   BMI 35.88 kg/m       Objective:   Physical Exam  Constitutional: He is oriented to person, place, and time. He appears well-developed and well-nourished. No distress.  Obese   HENT:  Head: Normocephalic and atraumatic.  Right Ear: Hearing, tympanic membrane, external ear and ear canal normal.  Left Ear: Hearing, tympanic membrane and external ear normal.  Nose: Nose normal.  Mouth/Throat: Oropharynx is clear and moist. He has dentures. No oropharyngeal exudate.  Eyes: Pupils are equal, round, and reactive to light. Conjunctivae and EOM are normal. Right eye exhibits no discharge. Left eye exhibits no discharge. No scleral icterus.  Neck: Normal range of motion. Neck supple. No JVD present. No tracheal deviation present. No thyromegaly present.  Cardiovascular: Normal rate, regular rhythm, normal heart sounds and intact distal pulses.  Exam reveals no gallop and no friction rub.   No murmur heard. Pulmonary/Chest: Effort normal and breath sounds normal. No stridor. No respiratory distress. He has no wheezes.  He has no rales. He exhibits no tenderness.  Abdominal: Soft. Bowel sounds are normal. He exhibits no distension and no mass. There is no tenderness. There is no rebound and no guarding.  Genitourinary:  Genitourinary Comments: Deferred    Musculoskeletal: Normal range of motion. He exhibits no edema, tenderness or deformity.  intermittent leg cramps   Lymphadenopathy:    He has no cervical adenopathy.  Neurological: He is alert and oriented to person, place, and time. He has normal reflexes. He displays normal reflexes. No cranial nerve deficit. He exhibits normal muscle tone. Coordination normal.  Skin: Skin is warm and dry. No rash noted. He is not diaphoretic.  No erythema. No pallor.  Psychiatric: He has a normal mood and affect. His behavior is normal. Judgment and thought content normal.  Nursing note and vitals reviewed.     Assessment & Plan:  1. Routine general medical examination at a health care facility - Encouraged exercise  - Follow up in one year for next CPE  - Basic metabolic panel - CBC with Differential/Platelet - Hepatic function panel - Lipid panel - TSH  2. Hyperlipidemia, unspecified hyperlipidemia type - D/c Zetia. Consider increasing statin or adding fenofibrate  - Basic metabolic panel - CBC with Differential/Platelet - Hepatic function panel - Lipid panel - TSH  3. BPH associated with nocturia - Asymptotic at this time  - Basic metabolic panel - CBC with Differential/Platelet - Hepatic function panel - Lipid panel - TSH - PSA  4. Uncontrolled type 2 diabetes mellitus with circulatory disorder, with long-term current use of insulin ([place]) - Continue with follow up with Endocrinology  - Basic metabolic panel - CBC with Differential/Platelet - Hepatic function panel - Lipid panel - TSH  5. Essential hypertension - Not at goal today. He took his medications just prior to arrival.  - Basic metabolic panel - CBC with Differential/Platelet -  Hepatic function panel - Lipid panel - TSH   6. Leg cramping - He does not feel as though this is a " big deal". There is some concern on my part about claudication due to hyperlipidemia and diabetes. We talked about seeing a specialist, but he refused at this time    [name], NP

## 2017-03-11 NOTE — Patient Instructions (Signed)
It was great seeing you today   Please work on drinking more water and getting more exercise   I will follow up with you regarding your lab work   Follow up with me in 6 months to see how you are doing

## 2017-03-16 ENCOUNTER — Other Ambulatory Visit: Payer: Self-pay | Admitting: Family Medicine

## 2017-03-16 DIAGNOSIS — E785 Hyperlipidemia, unspecified: Secondary | ICD-10-CM

## 2017-03-16 NOTE — Telephone Encounter (Signed)
Sent to the pharmacy by e-scribe. 

## 2017-03-16 NOTE — Telephone Encounter (Signed)
Cory's pt now,

## 2017-03-29 MED FILL — HumuLIN N 100 UNIT/ML SUSP: 100 | 25 days supply | Qty: 40 | Fill #2

## 2017-04-01 ENCOUNTER — Encounter: Payer: Self-pay | Admitting: Internal Medicine

## 2017-04-01 ENCOUNTER — Ambulatory Visit: Payer: Medicare Other | Admitting: Internal Medicine

## 2017-04-01 VITALS — BP 144/60 | HR 83 | Temp 98.3°F | Ht 68.0 in | Wt 235.6 lb

## 2017-04-01 DIAGNOSIS — E1159 Type 2 diabetes mellitus with other circulatory complications: Secondary | ICD-10-CM | POA: Diagnosis not present

## 2017-04-01 DIAGNOSIS — E1165 Type 2 diabetes mellitus with hyperglycemia: Secondary | ICD-10-CM | POA: Diagnosis not present

## 2017-04-01 DIAGNOSIS — Z794 Long term (current) use of insulin: Secondary | ICD-10-CM

## 2017-04-01 DIAGNOSIS — E785 Hyperlipidemia, unspecified: Secondary | ICD-10-CM | POA: Diagnosis not present

## 2017-04-01 DIAGNOSIS — IMO0002 Reserved for concepts with insufficient information to code with codable children: Secondary | ICD-10-CM

## 2017-04-01 LAB — POCT GLYCOSYLATED HEMOGLOBIN (HGB A1C): Hemoglobin A1C: 7.7

## 2017-04-01 MED ORDER — INSULIN REGULAR HUMAN 100 UNIT/ML IJ SOLN: INTRAMUSCULAR | 3 refills | Status: DC

## 2017-04-01 MED ORDER — INSULIN NPH (HUMAN) (ISOPHANE) 100 UNIT/ML ~~LOC~~ SUSP: SUBCUTANEOUS | 3 refills | Status: DC

## 2017-04-01 NOTE — Patient Instructions (Addendum)
Please continue: Insulin Before breakfast Before lunch Before dinner Bedtime  Regular 30 units (25 units if you plan to be active after the meal)  15 units 30 units (25 units if you eat a lighter meal) -  NPH 50 units 15 units - 50 units   Please come back for a follow-up appointment in 3 months

## 2017-04-01 NOTE — Progress Notes (Signed)
Patient ID: RAMSEY GUADAMUZ, male   DOB: 01/28/1941, 76 y.o.   MRN: [id number]  HPI: HENRY UTSEY is a 76 y.o.-year-old male, returning for f/u for DM2 dx 1990, insulin-dependent since 2005, uncontrolled, with complications (CAD, CKD, PN, DR). Last visit was 3 months ago.  Before last visit, he started on the plant-based diet, as suggested and his sugars improved. Since last visit, he increased his portions and restarted eating sweets >> sugars worse again.   Last hemoglobin A1c was: Lab Results  Component Value Date   HGBA1C 7.4 01/07/2017   HGBA1C 8.0 09/24/2016   HGBA1C 8.0 06/11/2016   He is now on insulin N and R:   Insulin Before breakfast Before lunch Before dinner Bedtime  Regular 25-30 units  10-15 units 25-30 units -  NPH 50-60 units 10-15 units 50-60 units (moved from bedtime) -   We tried U500 insulin but this was too expensive. He did not start Trulicity.  Pt checks his sugars  3 times a day: - am:  101-181 >> 106-197, 233 (better in last mo) >> 53, 80-192 - 1-2h after b'fast:  64x1; 99-178, 184 >> n/c >> 97-191, 210 >> 170 - before lunch:80-164, 183 >> 74, 106-181 >> 89-162, 184 >> 63, 75-191, 219 - 2h after lunch: 143 (after tea) >> n/c >> 67 x1, 71-126 >> 223 - before dinner: 79, 116-176, 181 >> 61 x1, 90-173, 221 >> 121-192, 312 - 1-2h after dinner: 73, 80-139, 202 >> n/c >> 121-177, 200 >> 82-328 - bedtime: 94, 122 >> n/c >> 78, 127-203, 245 - nighttime:   70-96 >> n/c >> 51 >> n/c >> 43 Lowest CBG: 60 >> 58 >> 70s >> 61 >> 43; he has hypoglycemia awareness at 90.  Highest CBG:200s >> 194 >> 181 >> 245 >> 312  Pt's meals are: - Breakfast: eggs, sausage, grits, coffee - Lunch: egg sandwich - Dinner: meat + veggies + starch - Snacks: 1-2: including after dinner; pears + mayonnaise, milk + crackers; cake  He drives a truck.  - + CKD, last BUN/creatinine:  Lab Results  Component Value Date   BUN 17 03/11/2017   CREATININE 1.31 03/11/2017  On  lisinopril. - + HL; last set of lipids: Lab Results  Component Value Date   CHOL 147 03/11/2017   HDL 37.90 (L) 03/11/2017   LDLCALC 40 03/05/2016   LDLDIRECT 73.0 03/11/2017   TRIG 242.0 (H) 03/11/2017   CHOLHDL 4 03/11/2017  On Zocor. - last eye exam was in 12/2016: + DR. Dr Radene Ou. + cataracts - He does have numbness and tingling in his feet.  He also has a history of HTN, HL.  Last TSH: Lab Results  Component Value Date   TSH 2.90 03/11/2017   ROS: Constitutional: no weight gain/no weight loss, no fatigue, no subjective hyperthermia, no subjective hypothermia Eyes: no blurry vision, no xerophthalmia ENT: no sore throat, no nodules palpated in throat, no dysphagia, no odynophagia, no hoarseness Cardiovascular: no CP/no SOB/no palpitations/no leg swelling Respiratory: no cough/no SOB/no wheezing Gastrointestinal: no N/no V/no D/no C/no acid reflux Musculoskeletal: no muscle aches/no joint aches Skin: no rashes, no hair loss Neurological: no tremors/+ numbness/+ tingling/no dizziness  I reviewed pt's medications, allergies, PMH, social hx, family hx, and changes were documented in the history of present illness. Otherwise, unchanged from my initial visit note.   PE: BP (!) 144/60   Pulse 83   Temp 98.3 F (36.8 C) (Oral)   Ht 5\' 8"  (1.727  m)   Wt 235 lb 9.6 oz (106.9 kg)   SpO2 95%   BMI 35.82 kg/m  Body mass index is 35.82 kg/m.  Wt Readings from Last 3 Encounters:  04/01/17 235 lb 9.6 oz (106.9 kg)  03/11/17 236 lb (107 kg)  01/07/17 236 lb (107 kg)   Constitutional: obese, in NAD Eyes: PERRLA, EOMI, no exophthalmos ENT: moist mucous membranes, no thyromegaly, no cervical lymphadenopathy Cardiovascular: RRR, No MRG Respiratory: CTA B Gastrointestinal: abdomen soft, NT, ND, BS+ Musculoskeletal: no deformities, strength intact in all 4 Skin: moist, warm, no rashes Neurological: no tremor with outstretched hands, DTR normal in all 4  ASSESSMENT: 1. DM2,  uncontrolled, insulin-dep, with complications - CAD - CKD - PN - DR  - Tried to obtain U500 insulin but this was too expensive >> we had to go back to N and R insulin  2. HL  PLAN:  1. Patient with  long-standing, uncontrolled diabetes type II, on basal-bolus insulin regimen, with NPH and regular insulin due to price.  I suggested a GLP-1 receptor agonist in the past, but he did not start this and before last visit he changed to a plant-based diet.  His sugars started to improve in the months prior to our last visit.  However, at this visit, he relaxed again his diet and his diabetes control is worse. He is eating more and also adding more sweets.  We discussed about reducing the portions and reducing the saturated fats and concentrated sweets in his diet.   - He occasionally eats a lighter dinner and in those situations he injects the same amount of insulin is for a larger meal.  I strongly advised him to use a lower amount.  We will also decrease his NPH both in a.m. and in the evening due to occasional lows before meals.  To also avoid lows at night I advised him to move NPH at bedtime, rather than before dinner, as he is taking it now. - He may also have low blood sugars before lunch if he is active in a.m. so I advised him to cut the regular insulin dose down if he plans to stay active after the breakfast - I advised him to: Patient Instructions   Please change: Insulin Before breakfast Before lunch Before dinner Bedtime  Regular 30 units (25 units if you plan to be active after the meal)  15 units 30 units (25 units if you eat a lighter meal) -  NPH 50 units 15 units - 50 units   Please come back for a follow-up appointment in 3 months  - today, HbA1c is 7.7% (higher) - continue checking sugars at different times of the day - check 3x a day, rotating checks - advised for yearly eye exams >> he is UTD - He is up-to-date with his flu shot - Return to clinic in 3 mo with sugar  log    2. HL - Reviewed last 2 lipid panels: LDL is worse, but it is still at goal.  Triglycerides are still high.   - He continues on Zocor and has no side effects from it >> will continue - At last visit and again today we discussed about the benefits of a plant-based diet in both improving his diabetes and his cholesterol levels.   Philemon Kingdom, MD PhD [facility] Endocrinology

## 2017-04-04 ENCOUNTER — Other Ambulatory Visit: Payer: Self-pay | Admitting: Internal Medicine

## 2017-04-04 MED FILL — HumuLIN R 100 UNIT/ML SOLN: 100 | 33 days supply | Qty: 30 | Fill #0

## 2017-05-02 MED FILL — HumuLIN R 100 UNIT/ML SOLN: 100 | 33 days supply | Qty: 30 | Fill #1

## 2017-05-02 MED FILL — HumuLIN N 100 UNIT/ML SUSP: 100 | 25 days supply | Qty: 40 | Fill #3

## 2017-06-01 ENCOUNTER — Telehealth: Payer: Self-pay | Admitting: Internal Medicine

## 2017-06-01 MED ORDER — INSULIN REGULAR HUMAN 100 UNIT/ML IJ SOLN: INTRAMUSCULAR | 0 refills | Status: DC

## 2017-06-01 MED ORDER — INSULIN NPH (HUMAN) (ISOPHANE) 100 UNIT/ML ~~LOC~~ SUSP: SUBCUTANEOUS | 0 refills | Status: DC

## 2017-06-01 MED FILL — NovoLIN N 100 UNIT/ML SUSP: 100 | 26 days supply | Qty: 30 | Fill #0

## 2017-06-01 NOTE — Telephone Encounter (Signed)
Pts wife states that insurance will no longer cover the HUMULIN but will cover [place]  pts wife states they want to get it changed.   Please advise

## 2017-06-01 NOTE — Telephone Encounter (Signed)
[place] called re: Humulin R needs to be changed to Novolin R. Insurance changed. If questions call [facility] at ph# [phone]

## 2017-06-01 NOTE — Telephone Encounter (Signed)
Medications sent based on Humulin dosages, pt is aware

## 2017-06-01 NOTE — Telephone Encounter (Signed)
Please advise on below  

## 2017-06-01 NOTE — Telephone Encounter (Signed)
Novolin is ok

## 2017-06-01 NOTE — Telephone Encounter (Signed)
Pharmacy states they do have the correct RX and the note to be changed was from yesterday

## 2017-06-02 MED FILL — NovoLIN R 100 UNIT/ML SOLN: 100 | 22 days supply | Qty: 20 | Fill #0

## 2017-06-02 NOTE — Telephone Encounter (Signed)
[facility] called back they needed this prescription changed from Humilin R to Novolin R- I did a verbal on this and let Dr. Cruzita Lederer know

## 2017-06-07 ENCOUNTER — Other Ambulatory Visit: Payer: Self-pay | Admitting: Internal Medicine

## 2017-06-07 NOTE — Telephone Encounter (Signed)
Pts wife came into the office today stating their insurances has changed. So they no longer cover the meter or test strips pt is using. They would like to see if we can send over a new script for a one touch meter and strips that go with it   Please advise.   [place], [place]

## 2017-06-10 ENCOUNTER — Telehealth: Payer: Self-pay | Admitting: Internal Medicine

## 2017-06-10 MED ORDER — ONETOUCH VERIO W/DEVICE KIT: 1.0000 | PACK | Freq: Four times a day (QID) | 0 refills | Status: DC

## 2017-06-10 MED ORDER — GLUCOSE BLOOD VI STRP: ORAL_STRIP | 12 refills | Status: DC

## 2017-06-10 NOTE — Telephone Encounter (Signed)
Called back and spoke to pt's wife. Asked her if she wants Rx's sent to local pharmacy or mail order. Mrs. Pavao said they decided to use local [facility]. Told her okay will send Rx's there. Mrs. Onnen verbalized understanding. Rx's sent.

## 2017-06-10 NOTE — Telephone Encounter (Signed)
Please call patient or patient's wife at ph# [phone] to clarify last call and the strips

## 2017-06-10 NOTE — Telephone Encounter (Signed)
See other message

## 2017-06-10 NOTE — Telephone Encounter (Signed)
Patient wife asked you call test strip in to pharmacy  Pharmacy:  [facility] [address] [employer] #:  --

## 2017-06-10 NOTE — Telephone Encounter (Signed)
Spoke to pt, told him need to clarify what pharmacy for meter and test strips, local or mail order [facility]. Pt said he is not sure please call back and speak to wife in a little bit. Told him okay will call back.

## 2017-06-30 ENCOUNTER — Other Ambulatory Visit: Payer: Self-pay | Admitting: Internal Medicine

## 2017-06-30 MED FILL — NovoLIN R 100 UNIT/ML SOLN: 100 | 22 days supply | Qty: 20 | Fill #0

## 2017-06-30 MED FILL — NovoLIN N 100 UNIT/ML SUSP: 100 | 26 days supply | Qty: 30 | Fill #0

## 2017-07-12 ENCOUNTER — Other Ambulatory Visit: Payer: Self-pay | Admitting: Family Medicine

## 2017-07-12 ENCOUNTER — Other Ambulatory Visit: Payer: Self-pay | Admitting: Adult Health

## 2017-07-12 DIAGNOSIS — E785 Hyperlipidemia, unspecified: Secondary | ICD-10-CM

## 2017-07-12 NOTE — Telephone Encounter (Signed)
Cory pt 

## 2017-07-14 NOTE — Telephone Encounter (Signed)
Sent to the pharmacy by e-scribe.  Pt has upcoming BP check on 09/07/17.

## 2017-07-14 NOTE — Telephone Encounter (Signed)
Sent to the pharmacy by e-scribe.  Pt has follow up for BP on 09/07/17.

## 2017-07-15 ENCOUNTER — Ambulatory Visit: Payer: Medicare Other | Admitting: Internal Medicine

## 2017-07-15 ENCOUNTER — Encounter: Payer: Self-pay | Admitting: Internal Medicine

## 2017-07-15 VITALS — BP 126/76 | HR 75

## 2017-07-15 DIAGNOSIS — E1165 Type 2 diabetes mellitus with hyperglycemia: Secondary | ICD-10-CM | POA: Diagnosis not present

## 2017-07-15 DIAGNOSIS — E1159 Type 2 diabetes mellitus with other circulatory complications: Secondary | ICD-10-CM | POA: Diagnosis not present

## 2017-07-15 DIAGNOSIS — E785 Hyperlipidemia, unspecified: Secondary | ICD-10-CM | POA: Diagnosis not present

## 2017-07-15 DIAGNOSIS — Z794 Long term (current) use of insulin: Secondary | ICD-10-CM

## 2017-07-15 DIAGNOSIS — IMO0002 Reserved for concepts with insufficient information to code with codable children: Secondary | ICD-10-CM

## 2017-07-15 LAB — POCT GLYCOSYLATED HEMOGLOBIN (HGB A1C): Hemoglobin A1C: 8.1

## 2017-07-15 NOTE — Patient Instructions (Addendum)
Please change: Insulin Before breakfast Before lunch Before dinner Bedtime  Regular 30 units (25 units if you plan to be active after the meal)  15 units 35 units (25 units if you eat a lighter meal) -  NPH 50 units 15 units - 50 units   If you can get Lantus, then use this instead of NPH: 50 units 2x a day.  Please come back for a follow-up appointment in 3 months.

## 2017-07-15 NOTE — Progress Notes (Signed)
Patient ID: Eric Choi, male   DOB: Oct 28, 1940, 77 y.o.   MRN: [id number]  HPI: Eric Choi is a 77 y.o.-year-old male, returning for f/u for DM2 dx 1990, insulin-dependent since 2005, uncontrolled, with complications (CAD, CKD, PN, DR). Last visit was 3 months ago.  He had great success in managing his diabetes after he started a more plant based diet. At last visit, sugars were higher as he came off the diet.  He is still not completely back to his previous diet.  He started Silver Sneakers this week.  Last hemoglobin A1c was: Lab Results  Component Value Date   HGBA1C 7.7 04/01/2017   HGBA1C 7.4 01/07/2017   HGBA1C 8.0 09/24/2016   He is now on insulin N and R:   Insulin Before breakfast Before lunch Before dinner Bedtime  Regular 30 units (25 units if you plan to be active after the meal)  15 units 30 units (25 units if you eat a lighter meal) -  NPH 50 units 15 units - 50 units   We tried U500 insulin but this was too expensive. He did not start Trulicity.  Pt checks his sugars 3 times a day: - am:  106-197, 233 >> 53, 80-192 >> 116, 155-203, 264 - 1-2h after b'fast:n/c >> 97-191, 210 >> 170 >> 89, 157-196 - before lunch: 89-162, 184 >> 63, 75-191, 219 >> 68, 83-222 - 2h after lunch:  n/c >> 67 x1, 71-126 >> 223 >> 54, 68, 141-234 - before dinner:61 x1, 90-173, 221 >> 121-192, 312 >> 73, 164, 20382- - 1-2h after dinner: 121-177, 200 >> 82-328 >> 167-197, 276 - bedtime: 94, 122 >> n/c >> 78, 127-203, 245 >> 128, 157-218 - nighttime:   70-96 >> n/c >> 51 >> n/c >> 43 >> 105-234 Lowest CBG: 43 >>54,  65 (woke him up); he has hypoglycemia awareness at 90.  Highest CBG: 312 >> 200  Pt's meals are: - Breakfast: eggs, sausage, grits, coffee - Lunch: egg sandwich - Dinner: meat + veggies + starch - Snacks: 1-2: including after dinner; pears + mayonnaise, milk + crackers; cake  He drives a truck.  - + CKD, last BUN/creatinine:  Lab Results  Component Value Date   BUN  17 03/11/2017   CREATININE 1.31 03/11/2017  On Lisinopril. - + HL; last set of lipids: Lab Results  Component Value Date   CHOL 147 03/11/2017   HDL 37.90 (L) 03/11/2017   LDLCALC 40 03/05/2016   LDLDIRECT 73.0 03/11/2017   TRIG 242.0 (H) 03/11/2017   CHOLHDL 4 03/11/2017  On Zocor - last eye exam was in 12/2016: + DR. Dr Radene Ou. + cataracts - He does have numbness and tingling in his feet.  He also has a history of HTN, HL.  Last TSH normal: Lab Results  Component Value Date   TSH 2.90 03/11/2017   ROS: Constitutional: no weight gain/no weight loss, no fatigue, no subjective hyperthermia, no subjective hypothermia Eyes: no blurry vision, no xerophthalmia ENT: no sore throat, no nodules palpated in throat, no dysphagia, no odynophagia, no hoarseness Cardiovascular: no CP/no SOB/no palpitations/no leg swelling Respiratory: no cough/no SOB/no wheezing Gastrointestinal: no N/no V/no D/no C/no acid reflux Musculoskeletal: no muscle aches/no joint aches Skin: no rashes, no hair loss Neurological: no tremors/no numbness/no tingling/no dizziness  I reviewed pt's medications, allergies, PMH, social hx, family hx, and changes were documented in the history of present illness. Otherwise, unchanged from my initial visit note.  PE: BP 126/76 (BP  Location: Left Arm, Patient Position: Sitting, Cuff Size: Large)   Pulse 75   SpO2 96%  There is no height or weight on file to calculate BMI.  Wt Readings from Last 3 Encounters:  04/01/17 235 lb 9.6 oz (106.9 kg)  03/11/17 236 lb (107 kg)  01/07/17 236 lb (107 kg)   Constitutional: overweight, in NAD Eyes: PERRLA, EOMI, no exophthalmos ENT: moist mucous membranes, no thyromegaly, no cervical lymphadenopathy Cardiovascular: RRR, No MRG Respiratory: CTA B Gastrointestinal: abdomen soft, NT, ND, BS+ Musculoskeletal: no deformities, strength intact in all 4 Skin: moist, warm, no rashes Neurological: no tremor with outstretched hands,  DTR normal in all 4  ASSESSMENT: 1. DM2, uncontrolled, insulin-dep, with complications - CAD - CKD - PN - DR  - Tried to obtain U500 insulin but this was too expensive >> we had to go back to N and R insulin  2. HL  PLAN:  1. Patient with long-standing, uncontrolled, diabetes, on basal-bolus insulin regimen, with NPH and regular insulin due to price.  In the past, I suggested a GLP-1 receptor agonist, but he did not start as he preferred to change his diet.  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  His sugars were worse.  We adjusted his insulin doses at last visit - At this visit,-sugars are fluctuating, with hyperglycemic spikes, and I believe that these are due to dietary indiscretions.  However, the majority of his sugars at bedtime are high, so we discussed about increasing the regular insulin with dinner.  Since he is starting exercise, I did not suggest any other changes in his insulin regimen.  We again discussed about the importance of diet, and I advised him not to cut out complex carbs, but cut out fatty foods. - He asks me about Lantus, as he can get this from a friend of his.  I advised him to take this instead of NPH, at 50 units twice a day and go back to the NPH when he runs out if his insurance does not cover it. - I advised him to: Patient Instructions   Please change: Insulin Before breakfast Before lunch Before dinner Bedtime  Regular 30 units (25 units if you plan to be active after the meal)  15 units 35 units (25 units if you eat a lighter meal) -  NPH 50 units 15 units - 50 units   If you can get Lantus, then use this instead of NPH: 50 units 2x a day.  Please come back for a follow-up appointment in 3 months.   - today, HbA1c is 8.1% (higher) - continue checking sugars at different times of the day - check 3x a day, rotating checks - advised for yearly eye exams  >> he is UTD - Return to clinic in 3 mo with sugar log     2. HL -Reviewed latest lipid panel from 02/2017, LDL worse, but still at goal.  Triglycerides still high -Continue Zocor without side effects.  Philemon Kingdom, MD PhD [facility] Endocrinology

## 2017-07-28 ENCOUNTER — Other Ambulatory Visit: Payer: Self-pay | Admitting: Internal Medicine

## 2017-07-28 MED FILL — NovoLIN N 100 UNIT/ML SUSP: 100 | 26 days supply | Qty: 30 | Fill #0

## 2017-07-28 MED FILL — NovoLIN R 100 UNIT/ML SOLN: 100 | 22 days supply | Qty: 20 | Fill #0

## 2017-08-25 ENCOUNTER — Other Ambulatory Visit: Payer: Self-pay | Admitting: Internal Medicine

## 2017-08-25 MED FILL — NovoLIN N 100 UNIT/ML SUSP: 100 | 26 days supply | Qty: 30 | Fill #0

## 2017-08-25 MED FILL — NovoLIN R 100 UNIT/ML SOLN: 100 | 22 days supply | Qty: 20 | Fill #0

## 2017-09-07 ENCOUNTER — Ambulatory Visit (INDEPENDENT_AMBULATORY_CARE_PROVIDER_SITE_OTHER): Payer: PPO | Admitting: Adult Health

## 2017-09-07 ENCOUNTER — Other Ambulatory Visit: Payer: Self-pay | Admitting: Adult Health

## 2017-09-07 ENCOUNTER — Encounter: Payer: Self-pay | Admitting: Adult Health

## 2017-09-07 VITALS — BP 140/64 | Temp 97.5°F | Wt 238.0 lb

## 2017-09-07 DIAGNOSIS — I10 Essential (primary) hypertension: Secondary | ICD-10-CM | POA: Diagnosis not present

## 2017-09-07 DIAGNOSIS — E785 Hyperlipidemia, unspecified: Secondary | ICD-10-CM

## 2017-09-07 NOTE — Telephone Encounter (Signed)
Sent to the pharmacy by e-scribe. 

## 2017-09-07 NOTE — Progress Notes (Signed)
Subjective:    Patient ID: Eric Choi, male    DOB: 07/03/1940, 77 y.o.   MRN: [id number]  HPI 77 year old male who  has a past medical history of CAD (coronary artery disease), Colon polyp, DIABETES MELLITUS, TYPE II (11/03/2006), HYPERLIPIDEMIA (11/03/2006), and HYPERTENSION (11/03/2006).  He is happy to report that he has retired.   He presents to the office today for 6 month follow up relating to hypertension. He reports that since retiring he has been working out more at [employer]. His diet has changed to more of a plant based diet. Was seen by endocrinology last month and A1c was 8.1.   BP Readings from Last 3 Encounters:  09/07/17 140/64  07/15/17 126/76  04/01/17 (!) 144/60     Overall, he feels as though he is doing pretty well. He has no acute complaints today     Review of Systems See HPI   Past Medical History:  Diagnosis Date  . CAD (coronary artery disease)   . Colon polyp   . DIABETES MELLITUS, TYPE II 11/03/2006  . HYPERLIPIDEMIA 11/03/2006  . HYPERTENSION 11/03/2006    Social History   Socioeconomic History  . Marital status: Married    Spouse name: Not on file  . Number of children: Not on file  . Years of education: Not on file  . Highest education level: Not on file  Occupational History  . Not on file  Social Needs  . Financial resource strain: Not on file  . Food insecurity:    Worry: Not on file    Inability: Not on file  . Transportation needs:    Medical: Not on file    Non-medical: Not on file  Tobacco Use  . Smoking status: Former Research scientist (life sciences)  . Smokeless tobacco: Never Used  Substance and Sexual Activity  . Alcohol use: No  . Drug use: No  . Sexual activity: Not on file  Lifestyle  . Physical activity:    Days per week: Not on file    Minutes per session: Not on file  . Stress: Not on file  Relationships  . Social connections:    Talks on phone: Not on file    Gets together: Not on file    Attends religious service: Not on file      Active member of club or organization: Not on file    Attends meetings of clubs or organizations: Not on file    Relationship status: Not on file  . Intimate partner violence:    Fear of current or ex partner: Not on file    Emotionally abused: Not on file    Physically abused: Not on file    Forced sexual activity: Not on file  Other Topics Concern  . Not on file  Social History Narrative   Works 3rd shift   Regular Exercise-yes    Past Surgical History:  Procedure Laterality Date  . ANGIOPLASTY  20 years ago   percutaneous transluminal   . CATARACT EXTRACTION  2002, 2005   bilateral  . PTCA      Family History  Problem Relation Age of Onset  . COPD Father   . Alcohol abuse Father   . Diabetes Father   . Cancer Sister        breast  . Colon cancer Neg Hx     No Known Allergies  Current Outpatient Medications on File Prior to Visit  Medication Sig Dispense Refill  . amLODipine (NORVASC)  10 MG tablet TAKE ONE TABLET BY MOUTH DAILY 90 tablet 1  . aspirin 81 MG tablet Take 81 mg by mouth daily.      . Blood Glucose Monitoring Suppl (ONETOUCH VERIO) w/Device KIT 1 each by Does not apply route 4 (four) times daily. 1 kit 0  . glucose blood (ONETOUCH VERIO) test strip USE TO CHECK BLOOD SUGAR FOUR TIMES A DAY AND PRN 100 each 12  . insulin NPH Human (NOVOLIN N) 100 UNIT/ML injection INJECT 50 UNITS AS DIRECTED BEFORE BREAKFAST AND DINNER, INJECT 15 UNITS BEFORE LUNCH 40 mL 0  . insulin regular (NOVOLIN R) 100 units/mL injection INJECT 15 TO 30 UNITS AS DIRECTED THREE TIMES DAILY BEFORE MEALS 30 mL 0  . Lancets Misc. (ACCU-CHEK MULTICLIX LANCET DEV) KIT Use to check sugar 4 times daily 400 each 5  . lisinopril (PRINIVIL,ZESTRIL) 20 MG tablet TAKE 1 TABLET (20 MG TOTAL) BY MOUTH DAILY. 90 tablet 4  . metoprolol tartrate (LOPRESSOR) 50 MG tablet TAKE ONE TABLET BY MOUTH DAILY 90 tablet 1  . NEEDLE, DISP, 30 G (B-D DISP NEEDLE 30GX1") 30G X 1" MISC by Does not apply route as  directed.      . simvastatin (ZOCOR) 40 MG tablet TAKE 1 TABLET (40 MG TOTAL) BY MOUTH AT BEDTIME. 90 tablet 1   No current facility-administered medications on file prior to visit.     BP 140/64   Temp (!) 97.5 F (36.4 C) (Oral)   Wt 238 lb (108 kg)   BMI 36.19 kg/m       Objective:   Physical Exam  Constitutional: He is oriented to person, place, and time. He appears well-developed and well-nourished. No distress.  Cardiovascular: Normal rate, regular rhythm, normal heart sounds and intact distal pulses. Exam reveals no gallop and no friction rub.  No murmur heard. Pulmonary/Chest: Effort normal and breath sounds normal. No respiratory distress. He has no wheezes. He has no rales. He exhibits no tenderness.  Abdominal: Soft. Bowel sounds are normal. He exhibits no distension and no mass. There is no tenderness. There is no rebound and no guarding.  Musculoskeletal: Normal range of motion. He exhibits no edema, tenderness or deformity.  Neurological: He is alert and oriented to person, place, and time.  Skin: Skin is warm and dry. No rash noted. He is not diaphoretic. No erythema. No pallor.  Psychiatric: He has a normal mood and affect. His behavior is normal. Judgment and thought content normal.  Nursing note and vitals reviewed.     Assessment & Plan:  1. Essential hypertension - Better controlled.  - No change in medications  - follow up in 6 months or sooner if needed   [name], NP

## 2017-09-21 ENCOUNTER — Telehealth: Payer: Self-pay | Admitting: Internal Medicine

## 2017-09-21 MED ORDER — INSULIN NPH (HUMAN) (ISOPHANE) 100 UNIT/ML ~~LOC~~ SUSP: SUBCUTANEOUS | 1 refills | Status: DC

## 2017-09-21 MED ORDER — INSULIN REGULAR HUMAN 100 UNIT/ML IJ SOLN: INTRAMUSCULAR | 1 refills | Status: DC

## 2017-09-21 MED FILL — NovoLIN N 100 UNIT/ML SUSP: 100 | 26 days supply | Qty: 30 | Fill #0

## 2017-09-21 MED FILL — NovoLIN R 100 UNIT/ML SOLN: 100 | 22 days supply | Qty: 20 | Fill #0

## 2017-09-21 NOTE — Telephone Encounter (Signed)
Refills sent. See meds.  

## 2017-09-21 NOTE — Telephone Encounter (Signed)
insulin regular (NOVOLIN R) 100 units/mL injection 2 vials     insulin NPH Human (NOVOLIN N) 100 UNIT/ML injection 3 vials    Pharmacy is calling requesting prescription be sent over    [place], [place]

## 2017-10-20 MED FILL — NovoLIN R 100 UNIT/ML SOLN: 100 | 22 days supply | Qty: 20 | Fill #1

## 2017-10-20 MED FILL — NovoLIN N 100 UNIT/ML SUSP: 100 | 26 days supply | Qty: 30 | Fill #1

## 2017-10-21 ENCOUNTER — Ambulatory Visit (INDEPENDENT_AMBULATORY_CARE_PROVIDER_SITE_OTHER): Payer: PPO | Admitting: Internal Medicine

## 2017-10-21 ENCOUNTER — Encounter: Payer: Self-pay | Admitting: Internal Medicine

## 2017-10-21 VITALS — BP 158/72 | HR 84 | Ht 68.0 in | Wt 237.0 lb

## 2017-10-21 DIAGNOSIS — E1159 Type 2 diabetes mellitus with other circulatory complications: Secondary | ICD-10-CM | POA: Diagnosis not present

## 2017-10-21 DIAGNOSIS — E785 Hyperlipidemia, unspecified: Secondary | ICD-10-CM | POA: Diagnosis not present

## 2017-10-21 DIAGNOSIS — Z794 Long term (current) use of insulin: Secondary | ICD-10-CM

## 2017-10-21 DIAGNOSIS — E1165 Type 2 diabetes mellitus with hyperglycemia: Secondary | ICD-10-CM | POA: Diagnosis not present

## 2017-10-21 DIAGNOSIS — IMO0002 Reserved for concepts with insufficient information to code with codable children: Secondary | ICD-10-CM

## 2017-10-21 LAB — POCT GLYCOSYLATED HEMOGLOBIN (HGB A1C): Hemoglobin A1C: 8.5 % — AB (ref 4.0–5.6)

## 2017-10-21 NOTE — Progress Notes (Signed)
Patient ID: Eric Choi, male   DOB: 05-11-1941, 77 y.o.   MRN: [id number]  HPI: Eric Choi is a 77 y.o.-year-old male, returning for f/u for DM2 dx 1990, insulin-dependent since 2005, uncontrolled, with complications (CAD, CKD, PN, DR). Last visit was 3 months ago.  He had great success in the past with a more plant-based diet.  However, he is not following the diet anymore.  He reintroduced sweets and fatty foods.  He started silver sneakers before our last visit.  He continues this, along with his wife.  Last hemoglobin A1c was: Lab Results  Component Value Date   HGBA1C 8.1 07/15/2017   HGBA1C 7.7 04/01/2017   HGBA1C 7.4 01/07/2017   He is now on insulin N and R:   Insulin Before breakfast Before lunch Before dinner Bedtime  Regular 30 units (25 units if you plan to be active after the meal)  15 units (30-) 35 units (25 units if you eat a lighter meal) -  NPH 50 units 15 units - 50 units   We tried U500 insulin but this was too expensive. He did not start Trulicity - 350$.  Pt checks his sugars 3-4 times a day -per his meter download review: - am:  53, 80-192 >> 116, 155-203, 264 >> 132-268 - 1-2h after b'fast: 170 >> 89, 157-196 >> 106-279 - before lunch:  63, 75-191, 219 >> 68, 83-222 >> 68, 71, 91-194, 224 - 2h after lunch:   223 >> 54, 68, 141-234 >> 69-262 - before dinner:121-192, 312 >> 73, 164, 203 >> 73-246 - 1-2h after dinner:82-328 >> 167-197, 276 >> 118-205, 240 - bedtime: 78, 127-203, 245 >> 128, 157-218 >> 104, 177-255, 290 - nighttime:  51 >> n/c >> 43 >> 105-234 >> 175-229 Lowest CBG: 43 >>54,  65 (woke him up) >> 51 (; he has hypoglycemia awareness in the 90s. Highest CBG: 312 >> 200 >> 279  Pt's meals are: - Breakfast: cereal + milk + eggs + bacon/sausage - Lunch: egg sandwich - Dinner: meat + veggies + starch - Snacks: 1-2: including after dinner; pears + mayonnaise, milk + crackers; cake  He drives a truck.  - + CKD, last BUN/creatinine:  Lab  Results  Component Value Date   BUN 17 03/11/2017   CREATININE 1.31 03/11/2017  On lisinopril. - + HL; last set of lipids: Lab Results  Component Value Date   CHOL 147 03/11/2017   HDL 37.90 (L) 03/11/2017   LDLCALC 40 03/05/2016   LDLDIRECT 73.0 03/11/2017   TRIG 242.0 (H) 03/11/2017   CHOLHDL 4 03/11/2017  On Zocor. - last eye exam was in 12/2016: + DR, also, + cataracts.'s Dr. Radene Ou. - + numbness and tingling in his feet.  He also has a history of HTN.  Last TSH normal: Lab Results  Component Value Date   TSH 2.90 03/11/2017   ROS: Constitutional: no weight gain/no weight loss, no fatigue, no subjective hyperthermia, no subjective hypothermia Eyes: no blurry vision, no xerophthalmia ENT: no sore throat, no nodules palpated in throat, no dysphagia, no odynophagia, no hoarseness Cardiovascular: no CP/no SOB/no palpitations/no leg swelling Respiratory: no cough/no SOB/no wheezing Gastrointestinal: no N/no V/no D/no C/no acid reflux Musculoskeletal: no muscle aches/no joint aches Skin: no rashes, no hair loss Neurological: no tremors/+ numbness/+ tingling/no dizziness  I reviewed pt's medications, allergies, PMH, social hx, family hx, and changes were documented in the history of present illness. Otherwise, unchanged from my initial visit note.  Past Medical  History:  Diagnosis Date  . CAD (coronary artery disease)   . Colon polyp   . DIABETES MELLITUS, TYPE II 11/03/2006  . HYPERLIPIDEMIA 11/03/2006  . HYPERTENSION 11/03/2006   Past Surgical History:  Procedure Laterality Date  . ANGIOPLASTY  20 years ago   percutaneous transluminal   . CATARACT EXTRACTION  2002, 2005   bilateral  . PTCA     Social History   Socioeconomic History  . Marital status: Married    Spouse name: Not on file  . Number of children: Not on file  . Years of education: Not on file  . Highest education level: Not on file  Occupational History  . Not on file  Social Needs  .  Financial resource strain: Not on file  . Food insecurity:    Worry: Not on file    Inability: Not on file  . Transportation needs:    Medical: Not on file    Non-medical: Not on file  Tobacco Use  . Smoking status: Former Research scientist (life sciences)  . Smokeless tobacco: Never Used  Substance and Sexual Activity  . Alcohol use: No  . Drug use: No  . Sexual activity: Not on file  Lifestyle  . Physical activity:    Days per week: Not on file    Minutes per session: Not on file  . Stress: Not on file  Relationships  . Social connections:    Talks on phone: Not on file    Gets together: Not on file    Attends religious service: Not on file    Active member of club or organization: Not on file    Attends meetings of clubs or organizations: Not on file    Relationship status: Not on file  . Intimate partner violence:    Fear of current or ex partner: Not on file    Emotionally abused: Not on file    Physically abused: Not on file    Forced sexual activity: Not on file  Other Topics Concern  . Not on file  Social History Narrative   Works 3rd shift   Regular Exercise-yes   Current Outpatient Medications on File Prior to Visit  Medication Sig Dispense Refill  . amLODipine (NORVASC) 10 MG tablet TAKE ONE TABLET BY MOUTH DAILY 90 tablet 1  . aspirin 81 MG tablet Take 81 mg by mouth daily.      . Blood Glucose Monitoring Suppl (ONETOUCH VERIO) w/Device KIT 1 each by Does not apply route 4 (four) times daily. 1 kit 0  . glucose blood (ONETOUCH VERIO) test strip USE TO CHECK BLOOD SUGAR FOUR TIMES A DAY AND PRN 100 each 12  . insulin NPH Human (NOVOLIN N) 100 UNIT/ML injection INJECT 50 UNITS AS DIRECTED BEFORE BREAKFAST AND DINNER, INJECT 15 UNITS BEFORE LUNCH 3 vial 1  . insulin regular (NOVOLIN R) 100 units/mL injection INJECT 15 TO 30 UNITS AS DIRECTED THREE TIMES DAILY BEFORE MEALS 2 vial 1  . Lancets Misc. (ACCU-CHEK MULTICLIX LANCET DEV) KIT Use to check sugar 4 times daily 400 each 5  .  lisinopril (PRINIVIL,ZESTRIL) 20 MG tablet TAKE 1 TABLET (20 MG TOTAL) BY MOUTH DAILY. 90 tablet 4  . metoprolol tartrate (LOPRESSOR) 50 MG tablet TAKE ONE TABLET BY MOUTH DAILY 90 tablet 1  . NEEDLE, DISP, 30 G (B-D DISP NEEDLE 30GX1") 30G X 1" MISC by Does not apply route as directed.      . simvastatin (ZOCOR) 40 MG tablet TAKE ONE TABLET BY MOUTH  AT BEDTIME 90 tablet 1   No current facility-administered medications on file prior to visit.    No Known Allergies Family History  Problem Relation Age of Onset  . COPD Father   . Alcohol abuse Father   . Diabetes Father   . Cancer Sister        breast  . Colon cancer Neg Hx     PE: BP (!) 158/72   Pulse 84   Ht 5' 8"  (1.727 m)   Wt 237 lb (107.5 kg)   SpO2 96%   BMI 36.04 kg/m  Body mass index is 36.04 kg/m.  Wt Readings from Last 3 Encounters:  10/21/17 237 lb (107.5 kg)  09/07/17 238 lb (108 kg)  04/01/17 235 lb 9.6 oz (106.9 kg)   Constitutional: overweight, in NAD Eyes: PERRLA, EOMI, no exophthalmos ENT: moist mucous membranes, no thyromegaly, no cervical lymphadenopathy Cardiovascular: RRR, No MRG Respiratory: CTA B Gastrointestinal: abdomen soft, NT, ND, BS+ Musculoskeletal: no deformities, strength intact in all 4 Skin: moist, warm, no rashes Neurological: no tremor with outstretched hands, DTR normal in all 4  ASSESSMENT: 1. DM2, uncontrolled, insulin-dep, with complications - CAD - CKD - PN - DR  - Tried to obtain U500 insulin but this was too expensive >> we had to go back to N and R insulin  2. HL  PLAN:  1. Patient with long-standing, uncontrolled, type 2 diabetes, on basal-bolus insulin regimen, with NPH and regular insulin due to price.  In the past, I suggested a GLP-1 receptor agonist, but he wanted to work on his diet before trying this.  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  We increased his insulin with dinner at last visit and again discussed about improving his diet, by cutting out simple carbs, processed foods, and fatty foods. He did not change his diet, and subsequently, sugars are even higher at this visit.  He cannot afford to add other new medicines, so at this point we are stuck, due to the fact that he occasionally has lower blood sugars, in the 60s and 70s (lowest 51), so we cannot increase his insulin doses further.  In this case, our only option for now is to go back to the previous, plant-based diet.  We again discussed about options for meals for this diet and he agrees to do this.  He also continues to go to [employer]. - I advised him to: Patient Instructions   Please continue: Insulin Before breakfast Before lunch Before dinner Bedtime  Regular 30 units (25 units if you plan to be active after the meal)  15 units 30-35 units (25 units if you eat a lighter meal) -  NPH 50 units 15 units - 50 units   Please come back for a follow-up appointment in 3-4 months.   - today, HbA1c is 8.5% (higher) - continue checking sugars at different times of the day - check 3x a day, rotating checks - advised for yearly eye exams >> he is UTD - Return to clinic in 3-4 mo with sugar log      2. HL - Reviewed latest lipid panel from 02/2017: LDL worse, but still at goal.  Triglycerides still high. - Continues  Zocor without side effects.   Philemon Kingdom, MD PhD [facility] Endocrinology

## 2017-10-21 NOTE — Patient Instructions (Addendum)
Please continue: Insulin Before breakfast Before lunch Before dinner Bedtime  Regular 30 units (25 units if you plan to be active after the meal)  15 units 30-35 units (25 units if you eat a lighter meal) -  NPH 50 units 15 units - 50 units   Please come back for a follow-up appointment in 3-4 months.

## 2017-10-21 NOTE — Addendum Note (Signed)
Addended by: Drucilla Schmidt on: 10/21/2017 01:10 PM   Modules accepted: Orders

## 2017-11-16 ENCOUNTER — Other Ambulatory Visit: Payer: Self-pay | Admitting: Internal Medicine

## 2017-11-16 MED FILL — NovoLIN R 100 UNIT/ML SOLN: 100 | 22 days supply | Qty: 20 | Fill #0

## 2017-11-16 MED FILL — NovoLIN N 100 UNIT/ML SUSP: 100 | 26 days supply | Qty: 30 | Fill #0

## 2017-12-14 MED FILL — NovoLIN N 100 UNIT/ML SUSP: 100 | 26 days supply | Qty: 30 | Fill #1

## 2017-12-14 MED FILL — NovoLIN R 100 UNIT/ML SOLN: 100 | 22 days supply | Qty: 20 | Fill #1

## 2017-12-15 DIAGNOSIS — H35043 Retinal micro-aneurysms, unspecified, bilateral: Secondary | ICD-10-CM | POA: Diagnosis not present

## 2017-12-15 DIAGNOSIS — H43821 Vitreomacular adhesion, right eye: Secondary | ICD-10-CM | POA: Diagnosis not present

## 2017-12-15 DIAGNOSIS — H43822 Vitreomacular adhesion, left eye: Secondary | ICD-10-CM | POA: Diagnosis not present

## 2017-12-15 DIAGNOSIS — H3563 Retinal hemorrhage, bilateral: Secondary | ICD-10-CM | POA: Diagnosis not present

## 2017-12-15 DIAGNOSIS — E113393 Type 2 diabetes mellitus with moderate nonproliferative diabetic retinopathy without macular edema, bilateral: Secondary | ICD-10-CM | POA: Diagnosis not present

## 2017-12-15 LAB — HM DIABETES EYE EXAM

## 2018-01-10 ENCOUNTER — Other Ambulatory Visit: Payer: Self-pay | Admitting: Adult Health

## 2018-01-10 DIAGNOSIS — E785 Hyperlipidemia, unspecified: Secondary | ICD-10-CM

## 2018-01-10 NOTE — Telephone Encounter (Signed)
Sent to the pharmacy by e-scribe for 90 days.  Pt is scheduled for cpx on 03/09/18.

## 2018-01-11 ENCOUNTER — Other Ambulatory Visit: Payer: Self-pay | Admitting: Internal Medicine

## 2018-01-11 MED FILL — NovoLIN N 100 UNIT/ML SUSP: 100 | 26 days supply | Qty: 30 | Fill #0

## 2018-01-11 MED FILL — NovoLIN R 100 UNIT/ML SOLN: 100 | 22 days supply | Qty: 20 | Fill #0

## 2018-02-09 MED FILL — NovoLIN R 100 UNIT/ML SOLN: 100 | 22 days supply | Qty: 20 | Fill #1

## 2018-02-09 MED FILL — NovoLIN N 100 UNIT/ML SUSP: 100 | 26 days supply | Qty: 30 | Fill #1

## 2018-03-03 ENCOUNTER — Encounter: Payer: Self-pay | Admitting: Internal Medicine

## 2018-03-03 ENCOUNTER — Ambulatory Visit (INDEPENDENT_AMBULATORY_CARE_PROVIDER_SITE_OTHER): Payer: PPO | Admitting: Internal Medicine

## 2018-03-03 VITALS — BP 148/90 | HR 79 | Ht 68.0 in | Wt 239.0 lb

## 2018-03-03 DIAGNOSIS — E785 Hyperlipidemia, unspecified: Secondary | ICD-10-CM | POA: Diagnosis not present

## 2018-03-03 DIAGNOSIS — IMO0002 Reserved for concepts with insufficient information to code with codable children: Secondary | ICD-10-CM

## 2018-03-03 DIAGNOSIS — E1165 Type 2 diabetes mellitus with hyperglycemia: Secondary | ICD-10-CM

## 2018-03-03 DIAGNOSIS — E1159 Type 2 diabetes mellitus with other circulatory complications: Secondary | ICD-10-CM | POA: Diagnosis not present

## 2018-03-03 DIAGNOSIS — Z794 Long term (current) use of insulin: Secondary | ICD-10-CM | POA: Diagnosis not present

## 2018-03-03 LAB — POCT GLYCOSYLATED HEMOGLOBIN (HGB A1C): Hemoglobin A1C: 9.2 % — AB (ref 4.0–5.6)

## 2018-03-03 MED ORDER — SEMAGLUTIDE(0.25 OR 0.5MG/DOS) 2 MG/1.5ML ~~LOC~~ SOPN: 0.5000 mg | PEN_INJECTOR | SUBCUTANEOUS | 5 refills | Status: DC

## 2018-03-03 NOTE — Progress Notes (Signed)
Patient ID: Eric Choi, male   DOB: [birth year], 77 y.o.   MRN: [id number]  HPI: Eric Choi is a 77 y.o.-year-old male, returning for f/u for DM2 dx 1990, insulin-dependent since 2005, uncontrolled, with complications (CAD, CKD, PN, DR). Last visit was 4.5 months ago.  His sugars have increased since last visit and he cannot seem to bring them down.  He also has nausea which in the past has been related to constipation.  He started to take a stool softener to see if this helps.  Since last visit, he cut down on his portions and also introduce more veggies.  He continues to go to [employer] along with his wife.  Last hemoglobin A1c was: Lab Results  Component Value Date   HGBA1C 8.5 (A) 10/21/2017   HGBA1C 8.1 07/15/2017   HGBA1C 7.7 04/01/2017   He is now on insulin N and R:   Insulin Before breakfast Before lunch Before dinner Bedtime  Regular  30 units (25 units if you plan to be active after the meal)   15 units  30-35 units (25 units if you eat a lighter meal) -  NPH  50 units  15 units -  50 units   We tried U500 insulin but this was too expensive. He did not start Trulicity - 664$.  Pt checks his sugars 3-4 times a day per his meter download reviewed: - am:  116, 155-203, 264 >> 132-268 >> 236-289 - 1-2h after b'fast: 170 >> 89, 157-196 >> 106-279 >> n/c - before lunch: 68, 71, 91-194, 224 >> 122, 128-284, 324 - 2h after lunch:   223 >> 54, 68, 141-234 >> 69-262 >> 234 - before dinner: 73, 164, 203 >> 73-246 >> 184-250, 307 - 1-2h after dinner:167-197, 276 >> 118-205, 240 >> 260 - bedtime:  128, 157-218 >> 104, 177-255, 290 >> n/c - nighttime:  51 >> n/c >> 43 >> 105-234 >> 175-229 >> n/c Lowest CBG: 43 >> 54,  65 >> 51 >> 122; he has hypoglycemia awareness in the 90s. Highest CBG: 312 >> 200 >> 279 >> 324  Pt's meals are: - Breakfast: cereal + milk + eggs + bacon/sausage - Lunch: egg sandwich - Dinner: meat + veggies + starch - Snacks: 1-2: including after  dinner; pears + mayonnaise, milk + crackers; cake  He drives a truck.  -+ Mild CKD, last BUN/creatinine:  Lab Results  Component Value Date   BUN 17 03/11/2017   CREATININE 1.31 03/11/2017  On lisinopril. -+ HL; last set of lipids: Lab Results  Component Value Date   CHOL 147 03/11/2017   HDL 37.90 (L) 03/11/2017   LDLCALC 40 03/05/2016   LDLDIRECT 73.0 03/11/2017   TRIG 242.0 (H) 03/11/2017   CHOLHDL 4 03/11/2017  On Zocor. - last eye exam was in 12/2016: + DR, also + cataracts. Dr. Radene Choi. - + numbness and tingling in his feet.  He also has HTN.  Last TSH normal: Lab Results  Component Value Date   TSH 2.90 03/11/2017   ROS: Constitutional: no weight gain/no weight loss, no fatigue, no subjective hyperthermia, no subjective hypothermia Eyes: no blurry vision, no xerophthalmia ENT: no sore throat, no nodules palpated in throat, no dysphagia, no odynophagia, no hoarseness Cardiovascular: no CP/no SOB/no palpitations/no leg swelling Respiratory: no cough/no SOB/no wheezing Gastrointestinal: + N/no V/no D/+ C/no acid reflux Musculoskeletal: no muscle aches/no joint aches Skin: no rashes, no hair loss Neurological: no tremors/+ numbness/+ tingling/no dizziness  I reviewed  pt's medications, allergies, PMH, social hx, family hx, and changes were documented in the history of present illness. Otherwise, unchanged from my initial visit note.  Past Medical History:  Diagnosis Date  . CAD (coronary artery disease)   . Colon polyp   . DIABETES MELLITUS, TYPE II 11/03/2006  . HYPERLIPIDEMIA 11/03/2006  . HYPERTENSION 11/03/2006   Past Surgical History:  Procedure Laterality Date  . ANGIOPLASTY  20 years ago   percutaneous transluminal   . CATARACT EXTRACTION  2002, 2005   bilateral  . PTCA     Social History   Socioeconomic History  . Marital status: Married    Spouse name: Not on file  . Number of children: Not on file  . Years of education: Not on file  . Highest  education level: Not on file  Occupational History  . Not on file  Social Needs  . Financial resource strain: Not on file  . Food insecurity:    Worry: Not on file    Inability: Not on file  . Transportation needs:    Medical: Not on file    Non-medical: Not on file  Tobacco Use  . Smoking status: Former Research scientist (life sciences)  . Smokeless tobacco: Never Used  Substance and Sexual Activity  . Alcohol use: No  . Drug use: No  . Sexual activity: Not on file  Lifestyle  . Physical activity:    Days per week: Not on file    Minutes per session: Not on file  . Stress: Not on file  Relationships  . Social connections:    Talks on phone: Not on file    Gets together: Not on file    Attends religious service: Not on file    Active member of club or organization: Not on file    Attends meetings of clubs or organizations: Not on file    Relationship status: Not on file  . Intimate partner violence:    Fear of current or ex partner: Not on file    Emotionally abused: Not on file    Physically abused: Not on file    Forced sexual activity: Not on file  Other Topics Concern  . Not on file  Social History Narrative   Works 3rd shift   Regular Exercise-yes   Current Outpatient Medications on File Prior to Visit  Medication Sig Dispense Refill  . amLODipine (NORVASC) 10 MG tablet TAKE ONE TABLET BY MOUTH DAILY 90 tablet 0  . aspirin 81 MG tablet Take 81 mg by mouth daily.      . Blood Glucose Monitoring Suppl (ONETOUCH VERIO) w/Device KIT 1 each by Does not apply route 4 (four) times daily. 1 kit 0  . glucose blood (ONETOUCH VERIO) test strip USE TO CHECK BLOOD SUGAR FOUR TIMES A DAY AND PRN 100 each 12  . insulin NPH Human (NOVOLIN N) 100 UNIT/ML injection INJECT 50 UNITS AS DIRECTED BEFORE BREAKFAST AND DINNER AND INJECT 15 UNITS BEFORE LUNCH 30 mL 1  . insulin regular (NOVOLIN R) 100 units/mL injection INJECT 15 TO 30 UNITS AS DIRECTED 3 TIMES DAILY BEFORE MEALS 20 mL 1  . Lancets Misc.  (ACCU-CHEK MULTICLIX LANCET DEV) KIT Use to check sugar 4 times daily 400 each 5  . lisinopril (PRINIVIL,ZESTRIL) 20 MG tablet TAKE 1 TABLET (20 MG TOTAL) BY MOUTH DAILY. 90 tablet 4  . metoprolol tartrate (LOPRESSOR) 50 MG tablet TAKE ONE TABLET BY MOUTH DAILY 90 tablet 0  . NEEDLE, DISP, 30 G (B-D DISP  NEEDLE 30GX1") 30G X 1" MISC by Does not apply route as directed.      . simvastatin (ZOCOR) 40 MG tablet TAKE ONE TABLET BY MOUTH AT BEDTIME 90 tablet 1   No current facility-administered medications on file prior to visit.    No Known Allergies Family History  Problem Relation Age of Onset  . COPD Father   . Alcohol abuse Father   . Diabetes Father   . Cancer Sister        breast  . Colon cancer Neg Hx     PE: BP (!) 148/90   Pulse 79   Ht 5' 8"  (1.727 m) Comment: measured  Wt 239 lb (108.4 kg)   SpO2 98%   BMI 36.34 kg/m  Body mass index is 36.34 kg/m.  Wt Readings from Last 3 Encounters:  03/03/18 239 lb (108.4 kg)  10/21/17 237 lb (107.5 kg)  09/07/17 238 lb (108 kg)   Constitutional: overweight, in NAD Eyes: PERRLA, EOMI, no exophthalmos ENT: moist mucous membranes, no thyromegaly, no cervical lymphadenopathy Cardiovascular: RRR, No MRG Respiratory: CTA B Gastrointestinal: abdomen soft, NT, ND, BS+ Musculoskeletal: no deformities, strength intact in all 4 Skin: moist, warm, no rashes Neurological: no tremor with outstretched hands, DTR normal in all 4  ASSESSMENT: 1. DM2, uncontrolled, insulin-dep, with complications - CAD - CKD - PN - DR  - Tried to obtain U500 insulin but this was too expensive >> we had to go back to N and R insulin  2. HL  3.  Obesity  PLAN:  1. Patient with long-standing, uncontrolled, type 2 diabetes, on basal-bolus insulin regimen with NPH and regular insulin due to price.  In the past, I suggested a GLP-1 receptor agonist but he wanted to work on his diet before trying this.  Also, I am not sure if he can afford this.  He  started a plant-based diet which worked great for him, but unfortunately, he came off the diet gradually and his sugars worsened.  At last visit, his HbA1c was 8.5%, higher.  We could not change the doses of his insulin as he occasionally had lower blood sugars, usually 60s to 70s but even 1 CBG at 51).  I strongly advised him at that time to go back to a more plant-based diet.  He was going to [employer]. -At this visit, sugars are higher and he feels this may be related to his NPH.  He noticed that the NPH solution is thicker, like syrup and he is sometimes having problems getting it out of the vial.  I advised him to start the new vial.  However, in the meantime, we discussed about adding a GLP-1 receptor agonist and this time he agrees to try this.  I explained that this will also help with his appetite, which has increased since last visit.  Also, he can help with weight.  He does have nausea which is possibly related to constipation he already started stool softener.  I explained that Ozempic can cause nausea after the first dose, but then it should improve.  I gave him a sample pen of  Ozempic.  We will started a low dose and advance as tolerated.  Otherwise, we will continue his current insulin doses.  Patient Instructions   Please continue: Insulin Before breakfast Before lunch Before dinner Bedtime  Regular  30 units (25 units if you plan to be active after the meal)   15 units  30-35 units (25 units if you  eat a lighter meal) -  NPH  50 units  15 units -  50 units   Please start Ozempic 0.25 mg weekly in a.m. (for example on Sunday morning) x 4 weeks, then increase to 0.5 mg weekly in a.m. if no nausea or hypoglycemia.  Please come back for a follow-up appointment in 3 months.   - today, HbA1c is 9.2% (higher) - continue checking sugars at different times of the day - check 3x a day, rotating checks - advised for yearly eye exams >> he is not UTD - he already had the flu shot for  this season - He is due for labs but would like to wait until the appointment with PCP which is coming up soon - Return to clinic in 3 mo with sugar log      2. HL - Reviewed latest lipid panel from 02/2017, LDL was worse, but still lower than 100.  Triglycerides are still high Lab Results  Component Value Date   CHOL 147 03/11/2017   HDL 37.90 (L) 03/11/2017   LDLCALC 40 03/05/2016   LDLDIRECT 73.0 03/11/2017   TRIG 242.0 (H) 03/11/2017   CHOLHDL 4 03/11/2017  - Continues Zocor without side effects.  3.  Obesity -gained 2 lbs since last visit -We unfortunately have to continue his high insulin doses, which are conducive to weight gain -Continue exercise at [employer] -He did start to improve his diet by cutting down portions and adding vegetables, but he is constantly hungry and mentions that he cannot stop eating after he starts. -Adding Ozempic will greatly help.  Philemon Kingdom, MD PhD [facility] Endocrinology

## 2018-03-03 NOTE — Addendum Note (Signed)
Addended by: Philemon Kingdom on: 03/03/2018 09:11 AM   Modules accepted: Orders

## 2018-03-03 NOTE — Patient Instructions (Addendum)
Please continue: Insulin Before breakfast Before lunch Before dinner Bedtime  Regular  30 units (25 units if you plan to be active after the meal)   15 units  30-35 units (25 units if you eat a lighter meal) -  NPH  50 units  15 units -  50 units   Please start Ozempic 0.25 mg weekly in a.m. (for example on Sunday morning) x 4 weeks, then increase to 0.5 mg weekly in a.m. if no nausea or hypoglycemia.  Please come back for a follow-up appointment in 3 months.

## 2018-03-07 ENCOUNTER — Other Ambulatory Visit: Payer: Self-pay | Admitting: Internal Medicine

## 2018-03-07 MED FILL — NovoLIN N 100 UNIT/ML SUSP: 100 | 26 days supply | Qty: 30 | Fill #0

## 2018-03-07 MED FILL — NovoLIN R 100 UNIT/ML SOLN: 100 | 22 days supply | Qty: 20 | Fill #0

## 2018-03-09 ENCOUNTER — Ambulatory Visit (INDEPENDENT_AMBULATORY_CARE_PROVIDER_SITE_OTHER): Payer: PPO | Admitting: Adult Health

## 2018-03-09 ENCOUNTER — Encounter: Payer: Self-pay | Admitting: Adult Health

## 2018-03-09 VITALS — BP 148/74 | Temp 98.2°F | Ht 68.0 in | Wt 238.0 lb

## 2018-03-09 DIAGNOSIS — Z0000 Encounter for general adult medical examination without abnormal findings: Secondary | ICD-10-CM

## 2018-03-09 DIAGNOSIS — R351 Nocturia: Secondary | ICD-10-CM | POA: Diagnosis not present

## 2018-03-09 DIAGNOSIS — E785 Hyperlipidemia, unspecified: Secondary | ICD-10-CM | POA: Diagnosis not present

## 2018-03-09 DIAGNOSIS — E1165 Type 2 diabetes mellitus with hyperglycemia: Secondary | ICD-10-CM

## 2018-03-09 DIAGNOSIS — I10 Essential (primary) hypertension: Secondary | ICD-10-CM

## 2018-03-09 DIAGNOSIS — N401 Enlarged prostate with lower urinary tract symptoms: Secondary | ICD-10-CM

## 2018-03-09 DIAGNOSIS — IMO0002 Reserved for concepts with insufficient information to code with codable children: Secondary | ICD-10-CM

## 2018-03-09 DIAGNOSIS — Z794 Long term (current) use of insulin: Secondary | ICD-10-CM | POA: Diagnosis not present

## 2018-03-09 DIAGNOSIS — E1159 Type 2 diabetes mellitus with other circulatory complications: Secondary | ICD-10-CM

## 2018-03-09 LAB — BASIC METABOLIC PANEL
BUN: 18 mg/dL (ref 6–23)
CO2: 26 mEq/L (ref 19–32)
Calcium: 10.4 mg/dL (ref 8.4–10.5)
Chloride: 101 mEq/L (ref 96–112)
Creatinine, Ser: 1.69 mg/dL — ABNORMAL HIGH (ref 0.40–1.50)
GFR: 42.17 mL/min — ABNORMAL LOW (ref >60.00)
Glucose, Bld: 180 mg/dL — ABNORMAL HIGH (ref 70–99)
Potassium: 5.4 mEq/L — ABNORMAL HIGH (ref 3.5–5.1)
Sodium: 140 mEq/L (ref 135–145)

## 2018-03-09 LAB — CBC WITH DIFFERENTIAL/PLATELET
Basophils Absolute: 0.1 10*3/uL (ref 0.0–0.1)
Basophils Relative: 0.8 % (ref 0.0–3.0)
Eosinophils Absolute: 0.4 10*3/uL (ref 0.0–0.7)
Eosinophils Relative: 3.4 % (ref 0.0–5.0)
HCT: 45.5 % (ref 39.0–52.0)
Hemoglobin: 15.3 g/dL (ref 13.0–17.0)
Lymphocytes Relative: 21.5 % (ref 12.0–46.0)
Lymphs Abs: 2.7 10*3/uL (ref 0.7–4.0)
MCHC: 33.7 g/dL (ref 30.0–36.0)
MCV: 89 fl (ref 78.0–100.0)
Monocytes Absolute: 1.5 10*3/uL — ABNORMAL HIGH (ref 0.1–1.0)
Monocytes Relative: 11.8 % (ref 3.0–12.0)
Neutro Abs: 7.8 10*3/uL — ABNORMAL HIGH (ref 1.4–7.7)
Neutrophils Relative %: 62.5 % (ref 43.0–77.0)
Platelets: 297 10*3/uL (ref 150.0–400.0)
RBC: 5.11 Mil/uL (ref 4.22–5.81)
RDW: 14.7 % (ref 11.5–15.5)
WBC: 12.5 10*3/uL — ABNORMAL HIGH (ref 4.0–10.5)

## 2018-03-09 LAB — HEPATIC FUNCTION PANEL
ALT: 27 U/L (ref 0–53)
AST: 19 U/L (ref 0–37)
Albumin: 4.7 g/dL (ref 3.5–5.2)
Alkaline Phosphatase: 54 U/L (ref 39–117)
Bilirubin, Direct: 0.1 mg/dL (ref 0.0–0.3)
Total Bilirubin: 0.6 mg/dL (ref 0.2–1.2)
Total Protein: 7.5 g/dL (ref 6.0–8.3)

## 2018-03-09 LAB — LIPID PANEL
Cholesterol: 143 mg/dL (ref 0–200)
HDL: 37 mg/dL — ABNORMAL LOW (ref >39.00)
NonHDL: 105.96
Total CHOL/HDL Ratio: 4
Triglycerides: 312 mg/dL — ABNORMAL HIGH (ref 0.0–149.0)
VLDL: 62.4 mg/dL — ABNORMAL HIGH (ref 0.0–40.0)

## 2018-03-09 LAB — LDL CHOLESTEROL, DIRECT: Direct LDL: 68 mg/dL

## 2018-03-09 LAB — PSA: PSA: 1.11 ng/mL (ref 0.10–4.00)

## 2018-03-09 LAB — TSH: TSH: 4.64 u[IU]/mL — ABNORMAL HIGH (ref 0.35–4.50)

## 2018-03-09 MED ORDER — LISINOPRIL 30 MG PO TABS: 30.0000 mg | ORAL_TABLET | Freq: Every day | ORAL | 3 refills | Status: DC

## 2018-03-09 MED ORDER — SIMVASTATIN 40 MG PO TABS: 40.0000 mg | ORAL_TABLET | Freq: Every day | ORAL | 3 refills | Status: DC

## 2018-03-09 NOTE — Progress Notes (Signed)
Subjective:    Patient ID: Eric Choi, male    DOB: 04-30-1941, 77 y.o.   MRN: [id number]  HPI Patient presents for yearly preventative medicine examination. He is a pleasant 77 year old male who  has a past medical history of CAD (coronary artery disease), Colon polyp, DIABETES MELLITUS, TYPE II (11/03/2006), HYPERLIPIDEMIA (11/03/2006), and HYPERTENSION (11/03/2006).   He has retired over the last year   Hypertension-currently takes Norvasc 10 mg, lisinopril 20 mg, and metoprolol 50 mg daily for hypertension.  Denies any dizziness, lightheadedness, or syncopal episodes. He monitors his blood pressure at home  BP Readings from Last 3 Encounters:  03/09/18 (!) 148/74  03/03/18 (!) 148/90  10/21/17 (!) 158/72   Uncontrolled Diabetes - Is followed by endocrinology.  His current regimen includes that of Ozempic 0.5 mg weekly, Novolin R 15 to 30 units 3 times daily and Novolin and 50 units before breakfast and dinner and 15 units before lunch Lab Results  Component Value Date   HGBA1C 9.2 (A) 03/03/2018   Hyperlipidemia - Takes Zocor 40 mg.  Lab Results  Component Value Date   CHOL 147 03/11/2017   HDL 37.90 (L) 03/11/2017   LDLCALC 40 03/05/2016   LDLDIRECT 73.0 03/11/2017   TRIG 242.0 (H) 03/11/2017   CHOLHDL 4 03/11/2017   All immunizations and health maintenance protocols were reviewed with the patient and needed orders were placed.  He is up-to-date on all vaccinations  Appropriate screening laboratory values were ordered for the patient including screening of hyperlipidemia, renal function and hepatic function. If indicated by BPH, a PSA was ordered.  Medication reconciliation,  past medical history, social history, problem list and allergies were reviewed in detail with the patient  Goals were established with regard to weight loss, exercise, and  diet in compliance with medications.  He needs to go to [employer] with his wife and has started working on portion control  and introducing more vegetables into his diet  Wt Readings from Last 3 Encounters:  03/09/18 238 lb (108 kg)  03/03/18 239 lb (108.4 kg)  10/21/17 237 lb (107.5 kg)     End of life planning was discussed.  He is up-to-date on routine health maintenance items such as colonoscopies and vision screens.   Review of Systems  Constitutional: Negative.   HENT: Negative.   Eyes: Negative.   Respiratory: Negative.   Cardiovascular: Negative.   Gastrointestinal: Negative.   Endocrine: Negative.   Genitourinary: Negative.   Musculoskeletal: Positive for arthralgias.  Skin: Negative.   Allergic/Immunologic: Negative.   Neurological: Negative.   Hematological: Negative.   Psychiatric/Behavioral: Negative.   All other systems reviewed and are negative.  Past Medical History:  Diagnosis Date  . CAD (coronary artery disease)   . Colon polyp   . DIABETES MELLITUS, TYPE II 11/03/2006  . HYPERLIPIDEMIA 11/03/2006  . HYPERTENSION 11/03/2006    Social History   Socioeconomic History  . Marital status: Married    Spouse name: Not on file  . Number of children: Not on file  . Years of education: Not on file  . Highest education level: Not on file  Occupational History  . Not on file  Social Needs  . Financial resource strain: Not on file  . Food insecurity:    Worry: Not on file    Inability: Not on file  . Transportation needs:    Medical: Not on file    Non-medical: Not on file  Tobacco Use  .  Smoking status: Former Research scientist (life sciences)  . Smokeless tobacco: Never Used  Substance and Sexual Activity  . Alcohol use: No  . Drug use: No  . Sexual activity: Not on file  Lifestyle  . Physical activity:    Days per week: Not on file    Minutes per session: Not on file  . Stress: Not on file  Relationships  . Social connections:    Talks on phone: Not on file    Gets together: Not on file    Attends religious service: Not on file    Active member of club or organization: Not on file     Attends meetings of clubs or organizations: Not on file    Relationship status: Not on file  . Intimate partner violence:    Fear of current or ex partner: Not on file    Emotionally abused: Not on file    Physically abused: Not on file    Forced sexual activity: Not on file  Other Topics Concern  . Not on file  Social History Narrative   Works 3rd shift   Regular Exercise-yes    Past Surgical History:  Procedure Laterality Date  . ANGIOPLASTY  20 years ago   percutaneous transluminal   . CATARACT EXTRACTION  2002, 2005   bilateral  . PTCA      Family History  Problem Relation Age of Onset  . COPD Father   . Alcohol abuse Father   . Diabetes Father   . Cancer Sister        breast  . Colon cancer Neg Hx     No Known Allergies  Current Outpatient Medications on File Prior to Visit  Medication Sig Dispense Refill  . amLODipine (NORVASC) 10 MG tablet TAKE ONE TABLET BY MOUTH DAILY 90 tablet 0  . aspirin 81 MG tablet Take 81 mg by mouth daily.      . Blood Glucose Monitoring Suppl (ONETOUCH VERIO) w/Device KIT 1 each by Does not apply route 4 (four) times daily. 1 kit 0  . glucose blood (ONETOUCH VERIO) test strip USE TO CHECK BLOOD SUGAR FOUR TIMES A DAY AND PRN 100 each 12  . insulin NPH Human (NOVOLIN N) 100 UNIT/ML injection INJECT 50 UNITS AS DIRECTED BEFORE BREAKFAST AND DINNER AND INJECT 15 UNITS BEFORE LUNCH 30 mL 1  . insulin regular (NOVOLIN R) 100 units/mL injection INJECT 15 TO 30 UNITS AS DIRECTED 3 TIMES DAILY BEFORE MEALS 20 mL 1  . Lancets Misc. (ACCU-CHEK MULTICLIX LANCET DEV) KIT Use to check sugar 4 times daily 400 each 5  . lisinopril (PRINIVIL,ZESTRIL) 20 MG tablet TAKE 1 TABLET (20 MG TOTAL) BY MOUTH DAILY. 90 tablet 4  . metoprolol tartrate (LOPRESSOR) 50 MG tablet TAKE ONE TABLET BY MOUTH DAILY 90 tablet 0  . NEEDLE, DISP, 30 G (B-D DISP NEEDLE 30GX1") 30G X 1" MISC by Does not apply route as directed.      . Semaglutide,0.25 or 0.5MG/DOS, (OZEMPIC,  0.25 OR 0.5 MG/DOSE,) 2 MG/1.5ML SOPN Inject 0.5 mg into the skin once a week. 2 pen 5  . simvastatin (ZOCOR) 40 MG tablet TAKE ONE TABLET BY MOUTH AT BEDTIME 90 tablet 1   No current facility-administered medications on file prior to visit.     BP (!) 148/74   Temp 98.2 F (36.8 C) (Oral)   Ht 5' 8"  (1.727 m)   Wt 238 lb (108 kg)   BMI 36.19 kg/m       Objective:  Physical Exam  Constitutional: He is oriented to person, place, and time. He appears well-developed and well-nourished. No distress.  Obese   HENT:  Head: Normocephalic and atraumatic.  Right Ear: External ear normal.  Left Ear: External ear normal.  Nose: Nose normal.  Mouth/Throat: Oropharynx is clear and moist. No oropharyngeal exudate.  Eyes: Pupils are equal, round, and reactive to light. Conjunctivae and EOM are normal. Right eye exhibits no discharge. Left eye exhibits no discharge. No scleral icterus.  Neck: Normal range of motion. Neck supple. No JVD present. No tracheal deviation present. No thyromegaly present.  Cardiovascular: Normal rate, regular rhythm, normal heart sounds and intact distal pulses. Exam reveals no gallop and no friction rub.  No murmur heard. Pulmonary/Chest: Effort normal and breath sounds normal. No stridor. No respiratory distress. He has no wheezes. He has no rales. He exhibits no tenderness.  Abdominal: Soft. Bowel sounds are normal. He exhibits no distension and no mass. There is no tenderness. There is no rebound and no guarding. No hernia.  Musculoskeletal: Normal range of motion. He exhibits no edema, tenderness or deformity.  Lymphadenopathy:    He has no cervical adenopathy.  Neurological: He is alert and oriented to person, place, and time. He displays normal reflexes. No cranial nerve deficit or sensory deficit. He exhibits normal muscle tone. Coordination normal.  Skin: Skin is warm and dry. Capillary refill takes less than 2 seconds. No rash noted. He is not diaphoretic.  No erythema. No pallor.  Psychiatric: He has a normal mood and affect. His behavior is normal. Judgment and thought content normal.  Nursing note and vitals reviewed.     Assessment & Plan:  1. Routine general medical examination at a health care facility - One year follow up  - Continue to work on weight loss. Increase duration of aerobic exercise  - Basic metabolic panel - CBC with Differential/Platelet - Hepatic function panel - Lipid panel - TSH  2. BPH associated with nocturia  - PSA  3. Uncontrolled type 2 diabetes mellitus with circulatory disorder, with long-term current use of insulin (HCC) - Continue to exercise and eat healthy  - Sample of Ozepic given  - Basic metabolic panel - CBC with Differential/Platelet - Hepatic function panel - Lipid panel - TSH  4. Essential hypertension - Will increase Lisinopril from 20 mg to 30 mg for tighter BP control  - Basic metabolic panel - CBC with Differential/Platelet - Hepatic function panel - Lipid panel - TSH - lisinopril (PRINIVIL,ZESTRIL) 30 MG tablet; Take 1 tablet (30 mg total) by mouth daily.  Dispense: 90 tablet; Refill: 3  5. Hyperlipidemia, unspecified hyperlipidemia type  - Basic metabolic panel - CBC with Differential/Platelet - Hepatic function panel - Lipid panel - TSH - simvastatin (ZOCOR) 40 MG tablet; Take 1 tablet (40 mg total) by mouth at bedtime.  Dispense: 90 tablet; Refill: 3  [name], NP

## 2018-03-10 ENCOUNTER — Other Ambulatory Visit: Payer: Self-pay | Admitting: Family Medicine

## 2018-03-10 MED ORDER — FENOFIBRATE 48 MG PO TABS: 48.0000 mg | ORAL_TABLET | Freq: Every day | ORAL | 3 refills | Status: DC

## 2018-03-13 MED FILL — OZEMPIC 0.25 OR 0.5 MG/DOSE: 2 | 56 days supply | Qty: 3 | Fill #0

## 2018-03-28 ENCOUNTER — Telehealth: Payer: Self-pay | Admitting: Internal Medicine

## 2018-03-28 NOTE — Telephone Encounter (Signed)
Pt's wife called to inform that their insurance would be calling between now and the end of the year to verify that Eric Choi is diabetic. Pt sates that this is due to a new insurance plan.

## 2018-04-06 MED FILL — NovoLIN N 100 UNIT/ML SUSP: 100 | 26 days supply | Qty: 30 | Fill #1

## 2018-04-06 MED FILL — NovoLIN R 100 UNIT/ML SOLN: 100 | 22 days supply | Qty: 20 | Fill #1

## 2018-04-07 ENCOUNTER — Other Ambulatory Visit: Payer: Self-pay | Admitting: Adult Health

## 2018-04-07 DIAGNOSIS — E785 Hyperlipidemia, unspecified: Secondary | ICD-10-CM

## 2018-05-03 ENCOUNTER — Other Ambulatory Visit: Payer: Self-pay | Admitting: Internal Medicine

## 2018-05-03 ENCOUNTER — Telehealth: Payer: Self-pay | Admitting: Family Medicine

## 2018-05-03 MED FILL — NovoLIN N 100 UNIT/ML SUSP: 100 | 26 days supply | Qty: 30 | Fill #0

## 2018-05-03 MED FILL — NovoLIN R 100 UNIT/ML SOLN: 100 | 22 days supply | Qty: 20 | Fill #0

## 2018-05-03 NOTE — Telephone Encounter (Signed)
Copied from [place] [id number]. Topic: General - Other >> May 03, 2018  1:16 PM Marin Olp L wrote: Reason for CRM: Glenna with [employer] calling to notify that the patient signed up for diabetes and heart care program with [employer] and they need confirmation of a diabetes and congestive heart failure diagnosis.

## 2018-05-03 NOTE — Telephone Encounter (Signed)
Spoke to the representative and informed her the pt will need to sign a medical release form.  Nothing further needed at this time.

## 2018-06-01 MED FILL — NovoLIN N 100 UNIT/ML SUSP: 100 | 26 days supply | Qty: 30 | Fill #1

## 2018-06-01 MED FILL — NovoLIN R 100 UNIT/ML SOLN: 100 | 22 days supply | Qty: 20 | Fill #1

## 2018-06-05 ENCOUNTER — Other Ambulatory Visit (INDEPENDENT_AMBULATORY_CARE_PROVIDER_SITE_OTHER): Payer: HMO

## 2018-06-05 DIAGNOSIS — E785 Hyperlipidemia, unspecified: Secondary | ICD-10-CM | POA: Diagnosis not present

## 2018-06-05 LAB — HEPATIC FUNCTION PANEL
ALT: 23 U/L (ref 0–53)
AST: 19 U/L (ref 0–37)
Albumin: 4.5 g/dL (ref 3.5–5.2)
Alkaline Phosphatase: 48 U/L (ref 39–117)
Bilirubin, Direct: 0.1 mg/dL (ref 0.0–0.3)
Total Bilirubin: 0.6 mg/dL (ref 0.2–1.2)
Total Protein: 7.1 g/dL (ref 6.0–8.3)

## 2018-06-05 LAB — LIPID PANEL
Cholesterol: 159 mg/dL (ref 0–200)
HDL: 32 mg/dL — ABNORMAL LOW (ref >39.00)
Total CHOL/HDL Ratio: 5
Triglycerides: 437 mg/dL — ABNORMAL HIGH (ref 0.0–149.0)

## 2018-06-05 LAB — LDL CHOLESTEROL, DIRECT: Direct LDL: 76 mg/dL

## 2018-06-07 MED FILL — OZEMPIC 0.25 OR 0.5 MG/DOSE: 2 | 56 days supply | Qty: 3 | Fill #1

## 2018-06-08 ENCOUNTER — Other Ambulatory Visit: Payer: Self-pay | Admitting: *Deleted

## 2018-06-08 MED ORDER — FENOFIBRATE 145 MG PO TABS: 145.0000 mg | ORAL_TABLET | Freq: Every day | ORAL | 2 refills | Status: DC

## 2018-06-28 ENCOUNTER — Other Ambulatory Visit: Payer: Self-pay | Admitting: Internal Medicine

## 2018-06-28 MED FILL — NovoLIN R 100 UNIT/ML SOLN: 100 | 22 days supply | Qty: 20 | Fill #0

## 2018-06-28 MED FILL — NovoLIN N 100 UNIT/ML SUSP: 100 | 26 days supply | Qty: 30 | Fill #0

## 2018-07-05 NOTE — Patient Instructions (Addendum)
Please continue: Insulin Before breakfast Before lunch Before dinner Bedtime  Regular 20-25 units  15 units 30-35 units -  NPH 40 units   50 units   Continue Ozempic 0.5 mg weekly in a.m.  Please come back for a follow-up appointment in 3-4 months.

## 2018-07-05 NOTE — Progress Notes (Signed)
Patient ID: Eric Choi, male   DOB: [birth year], 78 y.o.   MRN: [id number]  HPI: Eric Choi is a 78 y.o.-year-old male, returning for f/u for DM2 dx 1990, insulin-dependent since 2005, uncontrolled, with complications (CAD, CKD, PN, DR). Last visit was 4 months ago.  Last hemoglobin A1c was: Lab Results  Component Value Date   HGBA1C 9.2 (A) 03/03/2018   HGBA1C 8.5 (A) 10/21/2017   HGBA1C 8.1 07/15/2017   He is now on insulin N and R:  Insulin Before breakfast Before lunch Before dinner Bedtime  Regular 30 units   10-15 units 30- -  NPH 50 units 15 units - 50 >> 45 units   - Ozempic 0.5 mg weekly in a.m. -started 02/2018 - could not afford it initially >> but managed to get it for free - no GI SEs  We tried U500 insulin but this was too expensive. He did not start Trulicity - 211$.  Pt checks his sugars 3-4 times a day-we reviewed his meter download: - am: 132-268 >> 236-289 >> 113-197 - 1-2h after b'fast: 89, 157-196 >> 106-279 >> n/c >> 65 - before lunch: 122, 128-284, 324 >> 65-152, 202, 219 - 2h after lunch: 54, 68, 141-234 >> 69-262 >> 234 >> 136-188 - before dinner:  73-246 >> 184-250, 307 >> 74-177, 205 - 1-2h after dinner: 118-205, 240 >> 260 >> 162 - bedtime:  128, 157-218 >> 104, 177-255, 290 >> n/c >> 187 - nighttime: 105-234 >> 175-229 >> n/c Lowest CBG: 51 >> 122 >> 65; he has hypoglycemia awareness in the 90s. Highest CBG: 324 >> 219.  Pt's meals are: - Breakfast: cereal + milk + eggs + bacon/sausage - Lunch: egg sandwich - Dinner: meat + veggies + starch - Snacks: 1-2: including after dinner; pears + mayonnaise, milk + crackers; cake  He drives a truck.  -+ Mild CKD, last BUN/creatinine:  Lab Results  Component Value Date   BUN 18 03/09/2018   CREATININE 1.69 (H) 03/09/2018  On lisinopril. -+ HL; last set of lipids: Lab Results  Component Value Date   CHOL 159 06/05/2018   HDL 32.00 (L) 06/05/2018   LDLCALC 40 03/05/2016   LDLDIRECT 76.0  06/05/2018   TRIG (H) 06/05/2018    437.0 Triglyceride is over 400; calculations on Lipids are invalid.   CHOLHDL 5 06/05/2018  On Zocor. - last eye exam was in 2019: + DR and cataracts. Dr. Radene Ou. -He has numbness and tingling in his feet.  He also has HTN.  Last TSH was slightly high: Lab Results  Component Value Date   TSH 4.64 (H) 03/09/2018   ROS: Constitutional: no weight gain/no weight loss, no fatigue, no subjective hyperthermia, no subjective hypothermia Eyes: no blurry vision, no xerophthalmia ENT: no sore throat, no nodules palpated in neck, no dysphagia, no odynophagia, no hoarseness Cardiovascular: no CP/no SOB/no palpitations/no leg swelling Respiratory: no cough/no SOB/no wheezing Gastrointestinal: no N/no V/no D/+ C/no acid reflux Musculoskeletal: no muscle aches/no joint aches Skin: no rashes, no hair loss Neurological: no tremors/+ numbness/+ tingling/no dizziness  I reviewed pt's medications, allergies, PMH, social hx, family hx, and changes were documented in the history of present illness. Otherwise, unchanged from my initial visit note.  Past Medical History:  Diagnosis Date  . CAD (coronary artery disease)   . Colon polyp   . DIABETES MELLITUS, TYPE II 11/03/2006  . HYPERLIPIDEMIA 11/03/2006  . HYPERTENSION 11/03/2006   Past Surgical History:  Procedure Laterality Date  .  ANGIOPLASTY  20 years ago   percutaneous transluminal   . CATARACT EXTRACTION  2002, 2005   bilateral  . PTCA     Social History   Socioeconomic History  . Marital status: Married    Spouse name: Not on file  . Number of children: Not on file  . Years of education: Not on file  . Highest education level: Not on file  Occupational History  . Not on file  Social Needs  . Financial resource strain: Not on file  . Food insecurity:    Worry: Not on file    Inability: Not on file  . Transportation needs:    Medical: Not on file    Non-medical: Not on file  Tobacco Use  .  Smoking status: Former Research scientist (life sciences)  . Smokeless tobacco: Never Used  Substance and Sexual Activity  . Alcohol use: No  . Drug use: No  . Sexual activity: Not on file  Lifestyle  . Physical activity:    Days per week: Not on file    Minutes per session: Not on file  . Stress: Not on file  Relationships  . Social connections:    Talks on phone: Not on file    Gets together: Not on file    Attends religious service: Not on file    Active member of club or organization: Not on file    Attends meetings of clubs or organizations: Not on file    Relationship status: Not on file  . Intimate partner violence:    Fear of current or ex partner: Not on file    Emotionally abused: Not on file    Physically abused: Not on file    Forced sexual activity: Not on file  Other Topics Concern  . Not on file  Social History Narrative   Works 3rd shift   Regular Exercise-yes   Current Outpatient Medications on File Prior to Visit  Medication Sig Dispense Refill  . amLODipine (NORVASC) 10 MG tablet TAKE ONE TABLET BY MOUTH DAILY 90 tablet 0  . aspirin 81 MG tablet Take 81 mg by mouth daily.      . Blood Glucose Monitoring Suppl (ONETOUCH VERIO) w/Device KIT 1 each by Does not apply route 4 (four) times daily. 1 kit 0  . fenofibrate (TRICOR) 145 MG tablet Take 1 tablet (145 mg total) by mouth daily. 90 tablet 2  . glucose blood (ONETOUCH VERIO) test strip USE TO CHECK BLOOD SUGAR FOUR TIMES A DAY AND PRN 100 each 12  . insulin NPH Human (NOVOLIN N) 100 UNIT/ML injection INJECT 50 UNITS INTO THE SKIN AS DIRECTED BEFORE BREAKFAST AND DINNER AND INJECT 15 UNITS BEFORE LUNCH 30 mL 1  . insulin regular (NOVOLIN R) 100 units/mL injection INJECT 15 TO 30 UNITS AS DIRECTED 3 TIMES DAILY BEFORE MEALS 20 mL 1  . Lancets Misc. (ACCU-CHEK MULTICLIX LANCET DEV) KIT Use to check sugar 4 times daily 400 each 5  . lisinopril (PRINIVIL,ZESTRIL) 30 MG tablet Take 1 tablet (30 mg total) by mouth daily. 90 tablet 3  .  metoprolol tartrate (LOPRESSOR) 50 MG tablet TAKE ONE TABLET BY MOUTH DAILY 90 tablet 0  . NEEDLE, DISP, 30 G (B-D DISP NEEDLE 30GX1") 30G X 1" MISC by Does not apply route as directed.      . Semaglutide,0.25 or 0.5MG/DOS, (OZEMPIC, 0.25 OR 0.5 MG/DOSE,) 2 MG/1.5ML SOPN Inject 0.5 mg into the skin once a week. 2 pen 5  . simvastatin (ZOCOR) 40 MG  tablet Take 1 tablet (40 mg total) by mouth at bedtime. 90 tablet 3   No current facility-administered medications on file prior to visit.    No Known Allergies Family History  Problem Relation Age of Onset  . COPD Father   . Alcohol abuse Father   . Diabetes Father   . Cancer Sister        breast  . Colon cancer Neg Hx     PE: BP 120/60   Pulse 78   Ht 5' 8"  (1.727 m)   Wt 236 lb (107 kg)   SpO2 98%   BMI 35.88 kg/m  Body mass index is 35.88 kg/m.  Wt Readings from Last 3 Encounters:  07/06/18 236 lb (107 kg)  03/09/18 238 lb (108 kg)  03/03/18 239 lb (108.4 kg)   Constitutional: overweight, in NAD Eyes: PERRLA, EOMI, no exophthalmos ENT: moist mucous membranes, no thyromegaly, no cervical lymphadenopathy Cardiovascular: RRR, No MRG Respiratory: CTA B Gastrointestinal: abdomen soft, NT, ND, BS+ Musculoskeletal: no deformities, strength intact in all 4 Skin: moist, warm, no rashes Neurological: no tremor with outstretched hands, DTR normal in all 4  ASSESSMENT: 1. DM2, uncontrolled, insulin-dep, with complications - CAD - CKD - PN - DR  - Tried to obtain U500 insulin but this was too expensive >> we had to go back to N and R insulin  2. HL  3.  Obesity  PLAN:  1. Patient with longstanding, uncontrolled, type 2 diabetes, on basal-bolus insulin regimen with NPH and regular insulin due to price.  He initially tried a plant-based diet which worked greatly for him, but unfortunately he came off the diet gradually and his sugars worsened.  At last visit, HbA1c was higher, at 9.2%.  We could not change his insulin dose much  as he occasionally had low blood sugars.  At that time, I suggested again Ozempic (he refused initially as he wanted to work on his diet).  We discussed about possible GI side effects.  He was able to start Ozempic and he tolerates this well.  He does have constipation which is not new.  He initially could not afford it, but he managed to get it for free now (apparently a program for residence of [facility] -At this visit, sugars are much better, especially in the morning, but also later in the day.  They are actually dropping after breakfast and before lunch so we will reduce both NPH and regular insulin before breakfast.  However, before dinner, they are higher than target so I advised him to use a higher regular insulin dose for lunch.  To avoid dropping his sugars later in the day, I will stop his lunchtime NPH.  Since sugars after dinner can be above target, will increase his before dinner insulin and also to improve the sugars in the morning we will increase his NPH insulin at bedtime. -I advised him to:  Patient Instructions   Please continue: Insulin Before breakfast Before lunch Before dinner Bedtime  Regular 20-25 units  15 units 30-35 units -  NPH 40 units   50 units   Continue Ozempic 0.5 mg weekly in a.m.  Please come back for a follow-up appointment in 3-4 months.   - today, HbA1c is 7.8% (much better!) - continue checking sugars at different times of the day - check 3x a day, rotating checks - advised for yearly eye exams >> he is UTD - Return to clinic in 3 mo with sugar log  2. HL - Reviewed latest lipid panel from last month: LDL at goal, HDL low, triglycerides over 400s. Lab Results  Component Value Date   CHOL 159 06/05/2018   HDL 32.00 (L) 06/05/2018   LDLCALC 40 03/05/2016   LDLDIRECT 76.0 06/05/2018   TRIG (H) 06/05/2018    437.0 Triglyceride is over 400; calculations on Lipids are invalid.   CHOLHDL 5 06/05/2018  - Continues Zocor without side  effects. - Improving diabetes control should also help with lipid levels  3.  Obesity -We unfortunately have to continue high insulin doses, which are conducive to weight gain -Continue to exercise at [employer] -He continues to try to improve his diet by cutting down portions and increasing the amount of vegetables. -Continue Ozempic which should also help with weight loss  Philemon Kingdom, MD PhD [facility] Endocrinology

## 2018-07-06 ENCOUNTER — Ambulatory Visit: Payer: PPO | Admitting: Internal Medicine

## 2018-07-06 ENCOUNTER — Other Ambulatory Visit: Payer: Self-pay

## 2018-07-06 ENCOUNTER — Ambulatory Visit (INDEPENDENT_AMBULATORY_CARE_PROVIDER_SITE_OTHER): Payer: HMO | Admitting: Internal Medicine

## 2018-07-06 ENCOUNTER — Other Ambulatory Visit: Payer: Self-pay | Admitting: Adult Health

## 2018-07-06 ENCOUNTER — Encounter: Payer: Self-pay | Admitting: Internal Medicine

## 2018-07-06 VITALS — BP 120/60 | HR 78 | Ht 68.0 in | Wt 236.0 lb

## 2018-07-06 DIAGNOSIS — E785 Hyperlipidemia, unspecified: Secondary | ICD-10-CM | POA: Diagnosis not present

## 2018-07-06 DIAGNOSIS — IMO0002 Reserved for concepts with insufficient information to code with codable children: Secondary | ICD-10-CM

## 2018-07-06 DIAGNOSIS — E1159 Type 2 diabetes mellitus with other circulatory complications: Secondary | ICD-10-CM | POA: Diagnosis not present

## 2018-07-06 DIAGNOSIS — E1165 Type 2 diabetes mellitus with hyperglycemia: Secondary | ICD-10-CM

## 2018-07-06 DIAGNOSIS — Z794 Long term (current) use of insulin: Secondary | ICD-10-CM | POA: Diagnosis not present

## 2018-07-06 LAB — POCT GLYCOSYLATED HEMOGLOBIN (HGB A1C): Hemoglobin A1C: 7.8 % — AB (ref 4.0–5.6)

## 2018-07-06 NOTE — Telephone Encounter (Signed)
Sent to the pharmacy by e-scribe. 

## 2018-07-11 ENCOUNTER — Other Ambulatory Visit: Payer: Self-pay | Admitting: Internal Medicine

## 2018-07-20 ENCOUNTER — Other Ambulatory Visit: Payer: Self-pay

## 2018-07-20 NOTE — Patient Outreach (Signed)
  [facility]) Care Management Chronic Special Needs Program  07/20/2018  Name: Eric Choi DOB: [birth year]  MRN: [id number]  Mr. Eric Choi is enrolled in a chronic special needs plan for Diabetes. A completed health risk assessment has not been received from the client and client has not responded to outreach attempts by their health care concierge.  The client's individualized care plan was developed based on available data.  Plan: . Send unsuccessful outreach letter with a copy of individualized care plan to client . Send individualized care plan to provider  Chronic care management coordinator will attempt outreach in 2-4 months.   Thea Silversmith, RN, MSN, [place] [phone]

## 2018-07-25 MED FILL — NovoLIN N 100 UNIT/ML SUSP: 100 | 26 days supply | Qty: 30 | Fill #1

## 2018-07-25 MED FILL — NovoLIN R 100 UNIT/ML SOLN: 100 | 22 days supply | Qty: 20 | Fill #1

## 2018-08-23 ENCOUNTER — Other Ambulatory Visit: Payer: Self-pay | Admitting: Internal Medicine

## 2018-08-23 MED FILL — NovoLIN N 100 UNIT/ML SUSP: 100 | 26 days supply | Qty: 30 | Fill #0

## 2018-08-23 MED FILL — NovoLIN R 100 UNIT/ML SOLN: 100 | 22 days supply | Qty: 20 | Fill #0

## 2018-09-08 ENCOUNTER — Other Ambulatory Visit: Payer: Self-pay

## 2018-09-08 ENCOUNTER — Other Ambulatory Visit: Payer: Self-pay | Admitting: Adult Health

## 2018-09-08 ENCOUNTER — Other Ambulatory Visit (INDEPENDENT_AMBULATORY_CARE_PROVIDER_SITE_OTHER): Payer: HMO

## 2018-09-08 DIAGNOSIS — E782 Mixed hyperlipidemia: Secondary | ICD-10-CM

## 2018-09-08 DIAGNOSIS — R7989 Other specified abnormal findings of blood chemistry: Secondary | ICD-10-CM

## 2018-09-08 LAB — LDL CHOLESTEROL, DIRECT: Direct LDL: 74 mg/dL

## 2018-09-08 LAB — LIPID PANEL
Cholesterol: 176 mg/dL (ref 0–200)
HDL: 31.7 mg/dL — ABNORMAL LOW (ref >39.00)
Total CHOL/HDL Ratio: 6
Triglycerides: 405 mg/dL — ABNORMAL HIGH (ref 0.0–149.0)

## 2018-09-08 LAB — HEPATIC FUNCTION PANEL
ALT: 26 U/L (ref 0–53)
AST: 20 U/L (ref 0–37)
Albumin: 4.3 g/dL (ref 3.5–5.2)
Alkaline Phosphatase: 42 U/L (ref 39–117)
Bilirubin, Direct: 0.1 mg/dL (ref 0.0–0.3)
Total Bilirubin: 0.5 mg/dL (ref 0.2–1.2)
Total Protein: 7 g/dL (ref 6.0–8.3)

## 2018-09-08 LAB — TSH: TSH: 3.61 u[IU]/mL (ref 0.35–4.50)

## 2018-09-13 ENCOUNTER — Other Ambulatory Visit: Payer: Self-pay | Admitting: Family Medicine

## 2018-09-13 DIAGNOSIS — E782 Mixed hyperlipidemia: Secondary | ICD-10-CM

## 2018-09-13 NOTE — Progress Notes (Signed)
Referral to lipid clinic.

## 2018-09-20 MED FILL — NovoLIN R 100 UNIT/ML SOLN: 100 | 22 days supply | Qty: 20 | Fill #1

## 2018-09-20 MED FILL — NovoLIN N 100 UNIT/ML SUSP: 100 | 26 days supply | Qty: 30 | Fill #1

## 2018-09-27 ENCOUNTER — Encounter: Payer: Self-pay | Admitting: Gastroenterology

## 2018-10-02 ENCOUNTER — Ambulatory Visit: Payer: Self-pay

## 2018-10-03 ENCOUNTER — Telehealth: Payer: Self-pay

## 2018-10-03 ENCOUNTER — Encounter: Payer: Self-pay | Admitting: Gastroenterology

## 2018-10-03 NOTE — Telephone Encounter (Signed)
Patient mailed the medication assistance application to [place] to be filled out and faxed however, patient did not include proof of income documents that are required.

## 2018-10-05 NOTE — Telephone Encounter (Signed)
Spoke to patient and let him know we need a copy of income, he will have his wife stop by to give [place] a copy of his SS income.

## 2018-10-06 NOTE — Telephone Encounter (Signed)
Patient wife dropped of the needed documents and I have faxed it all to [facility].

## 2018-10-17 ENCOUNTER — Other Ambulatory Visit: Payer: Self-pay

## 2018-10-19 ENCOUNTER — Telehealth: Payer: Self-pay

## 2018-10-19 ENCOUNTER — Other Ambulatory Visit: Payer: Self-pay

## 2018-10-19 ENCOUNTER — Ambulatory Visit (INDEPENDENT_AMBULATORY_CARE_PROVIDER_SITE_OTHER): Payer: HMO | Admitting: Internal Medicine

## 2018-10-19 ENCOUNTER — Encounter: Payer: Self-pay | Admitting: Internal Medicine

## 2018-10-19 VITALS — BP 130/80 | HR 84 | Ht 68.0 in | Wt 237.0 lb

## 2018-10-19 DIAGNOSIS — IMO0002 Reserved for concepts with insufficient information to code with codable children: Secondary | ICD-10-CM

## 2018-10-19 DIAGNOSIS — Z794 Long term (current) use of insulin: Secondary | ICD-10-CM | POA: Diagnosis not present

## 2018-10-19 DIAGNOSIS — E1165 Type 2 diabetes mellitus with hyperglycemia: Secondary | ICD-10-CM | POA: Diagnosis not present

## 2018-10-19 DIAGNOSIS — E1159 Type 2 diabetes mellitus with other circulatory complications: Secondary | ICD-10-CM | POA: Diagnosis not present

## 2018-10-19 DIAGNOSIS — E669 Obesity, unspecified: Secondary | ICD-10-CM | POA: Insufficient documentation

## 2018-10-19 DIAGNOSIS — E785 Hyperlipidemia, unspecified: Secondary | ICD-10-CM | POA: Diagnosis not present

## 2018-10-19 LAB — POCT GLYCOSYLATED HEMOGLOBIN (HGB A1C): Hemoglobin A1C: 8.1 % — AB (ref 4.0–5.6)

## 2018-10-19 MED ORDER — INSULIN NPH (HUMAN) (ISOPHANE) 100 UNIT/ML ~~LOC~~ SUSP: SUBCUTANEOUS | 11 refills | Status: DC

## 2018-10-19 MED ORDER — INSULIN REGULAR HUMAN 100 UNIT/ML IJ SOLN: INTRAMUSCULAR | 11 refills | Status: DC

## 2018-10-19 MED FILL — NovoLIN N 100 UNIT/ML SUSP: 100 | 27 days supply | Qty: 30 | Fill #0

## 2018-10-19 MED FILL — NovoLIN R 100 UNIT/ML SOLN: 100 | 30 days supply | Qty: 30 | Fill #0

## 2018-10-19 NOTE — Progress Notes (Signed)
Patient ID: Eric Choi, male   DOB: September 13, 1940, 78 y.o.   MRN: [id number]  HPI: Eric Choi is a 78 y.o.-year-old male, returning for f/u for DM2 dx 1990, insulin-dependent since 2005, uncontrolled, with complications (CAD, CKD, PN, DR). Last visit was 3.5 months ago.  Last hemoglobin A1c was: Lab Results  Component Value Date   HGBA1C 7.8 (A) 07/06/2018   HGBA1C 9.2 (A) 03/03/2018   HGBA1C 8.5 (A) 10/21/2017   He is on:  Insulin Before breakfast Before lunch Before dinner Bedtime  Regular 20-25 units  15 units 30-35 units -  NPH 40 units   50 units   - Ozempic 0.5 mg weekly -started 02/2018  -however, he tells me that she cannot really afford this  We tried U500 insulin but this was too expensive. He did not start Trulicity - 709$.  Pt checks his sugars 3-4 times a day, per review of his meter download (however, last month was on his last Ozempic pen): - am: 236-289 >> 113-197 >> 116, 120-193, 214 - 1-2h after b'fast: 106-279 >> n/c >> 65 >> 165 - before lunch: 122, 128-284, 324 >> 65-152, 202, 219 >> 61, 79-214 - 2h after lunch: 69-262 >> 234 >> 136-188 >> n/c - before dinner: 184-250, 307 >> 74-177, 205 >> 94-200 - 1-2h after dinner: 118-205, 240 >> 260 >> 162 >> n/c - bedtime:  104, 177-255, 290 >> n/c >> 187 >> 131 - nighttime: 105-234 >> 175-229 >> n/c Lowest CBG: 51 >> 122 >> 65 >> 61; he has hypoglycemia awareness in the 90s. Highest CBG: 324 >> 219 >> 214.  Pt's meals are: - Breakfast: cereal + milk + eggs + bacon/sausage - Lunch: egg sandwich - Dinner: meat + veggies + starch - Snacks: 1-2: including after dinner; pears + mayonnaise, milk + crackers; cake  He drives a truck.  -+ Mild CKD, last BUN/creatinine:  Lab Results  Component Value Date   BUN 18 03/09/2018   CREATININE 1.69 (H) 03/09/2018  On lisinopril. -+ HL; last set of lipids: Lab Results  Component Value Date   CHOL 176 09/08/2018   HDL 31.70 (L) 09/08/2018   LDLCALC 40 03/05/2016   LDLDIRECT 74.0 09/08/2018   TRIG (H) 09/08/2018    405.0 Triglyceride is over 400; calculations on Lipids are invalid.   CHOLHDL 6 09/08/2018  On Zocor 40. Added Fenofibrate 145. - last eye exam was in 2019: No DR; +  cataracts. Dr. Radene Ou. -+ numbness and tingling in his feet.  He also has HTN.  Latest TSH was normal: Lab Results  Component Value Date   TSH 3.61 09/08/2018   He lost his twin brother to cancer 05/2018. ROS: Constitutional: no weight gain/no weight loss, no fatigue, no subjective hyperthermia, no subjective hypothermia Eyes: no blurry vision, no xerophthalmia ENT: no sore throat, no nodules palpated in neck, no dysphagia, no odynophagia, no hoarseness Cardiovascular: no CP/no SOB/no palpitations/no leg swelling Respiratory: no cough/no SOB/no wheezing Gastrointestinal: no N/no V/no D/+ C/no acid reflux Musculoskeletal: no muscle aches/no joint aches Skin: no rashes, no hair loss Neurological: no tremors/+ numbness/+ tingling/no dizziness  I reviewed pt's medications, allergies, PMH, social hx, family hx, and changes were documented in the history of present illness. Otherwise, unchanged from my initial visit note.  Past Medical History:  Diagnosis Date  . CAD (coronary artery disease)   . Colon polyp   . DIABETES MELLITUS, TYPE II 11/03/2006  . HYPERLIPIDEMIA 11/03/2006  . HYPERTENSION 11/03/2006  Past Surgical History:  Procedure Laterality Date  . ANGIOPLASTY  20 years ago   percutaneous transluminal   . CATARACT EXTRACTION  2002, 2005   bilateral  . PTCA     Social History   Socioeconomic History  . Marital status: Married    Spouse name: Not on file  . Number of children: Not on file  . Years of education: Not on file  . Highest education level: Not on file  Occupational History  . Not on file  Social Needs  . Financial resource strain: Not on file  . Food insecurity:    Worry: Not on file    Inability: Not on file  . Transportation  needs:    Medical: Not on file    Non-medical: Not on file  Tobacco Use  . Smoking status: Former Research scientist (life sciences)  . Smokeless tobacco: Never Used  Substance and Sexual Activity  . Alcohol use: No  . Drug use: No  . Sexual activity: Not on file  Lifestyle  . Physical activity:    Days per week: Not on file    Minutes per session: Not on file  . Stress: Not on file  Relationships  . Social connections:    Talks on phone: Not on file    Gets together: Not on file    Attends religious service: Not on file    Active member of club or organization: Not on file    Attends meetings of clubs or organizations: Not on file    Relationship status: Not on file  . Intimate partner violence:    Fear of current or ex partner: Not on file    Emotionally abused: Not on file    Physically abused: Not on file    Forced sexual activity: Not on file  Other Topics Concern  . Not on file  Social History Narrative   Works 3rd shift   Regular Exercise-yes   Current Outpatient Medications on File Prior to Visit  Medication Sig Dispense Refill  . amLODipine (NORVASC) 10 MG tablet TAKE ONE TABLET BY MOUTH DAILY 90 tablet 2  . aspirin 81 MG tablet Take 81 mg by mouth daily.      . Blood Glucose Monitoring Suppl (ONETOUCH VERIO) w/Device KIT 1 each by Does not apply route 4 (four) times daily. 1 kit 0  . fenofibrate (TRICOR) 145 MG tablet Take 1 tablet (145 mg total) by mouth daily. 90 tablet 2  . glucose blood test strip TEST BLOOD SUGAR  FOUR TIMES A DAY AND AS NEEDED 400 each 11  . insulin NPH Human (NOVOLIN N) 100 UNIT/ML injection INJECT 50 UNITS UNDER THE SKIN BEFORE BREAKFAST AND DINNER AND 15 UNITS BEFORE LUNCH 30 mL 1  . insulin regular (NOVOLIN R) 100 units/mL injection INJECT 15-30 UNITS THREE TIMES DAILY BEFORE MEALS AS DIRECTED 20 mL 1  . Lancets Misc. (ACCU-CHEK MULTICLIX LANCET DEV) KIT Use to check sugar 4 times daily 400 each 5  . lisinopril (PRINIVIL,ZESTRIL) 30 MG tablet Take 1 tablet (30 mg  total) by mouth daily. 90 tablet 3  . metoprolol tartrate (LOPRESSOR) 50 MG tablet TAKE ONE TABLET BY MOUTH DAILY 90 tablet 2  . NEEDLE, DISP, 30 G (B-D DISP NEEDLE 30GX1") 30G X 1" MISC by Does not apply route as directed.      . Semaglutide,0.25 or 0.5MG/DOS, (OZEMPIC, 0.25 OR 0.5 MG/DOSE,) 2 MG/1.5ML SOPN Inject 0.5 mg into the skin once a week. 2 pen 5  . simvastatin (ZOCOR)  40 MG tablet Take 1 tablet (40 mg total) by mouth at bedtime. 90 tablet 3   No current facility-administered medications on file prior to visit.    No Known Allergies Family History  Problem Relation Age of Onset  . COPD Father   . Alcohol abuse Father   . Diabetes Father   . Cancer Sister        breast  . Colon cancer Neg Hx     PE: BP 130/80   Pulse 84   Ht _0  (1.727 m)   Wt 237 lb (107.5 kg)   SpO2 96%   BMI 36.04 kg/m  Body mass index is 36.04 kg/m.  Wt Readings from Last 3 Encounters:  10/19/18 237 lb (107.5 kg)  07/06/18 236 lb (107 kg)  03/09/18 238 lb (108 kg)   Constitutional: overweight, in NAD Eyes: PERRLA, EOMI, no exophthalmos ENT: moist mucous membranes, no thyromegaly, no cervical lymphadenopathy Cardiovascular: RRR, No MRG Respiratory: CTA B Gastrointestinal: abdomen soft, NT, ND, BS+ Musculoskeletal: no deformities, strength intact in all 4 Skin: moist, warm, no rashes Neurological: no tremor with outstretched hands, DTR normal in all 4  ASSESSMENT: 1. DM2, uncontrolled, insulin-dep, with complications - CAD - CKD - PN - DR  - Tried to obtain U500 insulin but this was too expensive >> we had to go back to N and R insulin  2. HL  3.  Obesity  PLAN:  1. Patient with longstanding, uncontrolled, type 2 diabetes, on basal/bolus insulin regimen, with NPH and regular insulin due to price.  He initially tried a plant-based diet, greatly for him, but unfortunately, he came off.  Sugars were sent and we added Ozempic.  He is tolerating this well, but does have constipation.   However, this is not new.   After the addition of Ozempic, sugars improved significantly, especially in the morning but also later in the day.  They were actually dropping too much after breakfast and before lunch so we decreased his NPH and regular insulin before breakfast at last visit.  Sugars before dinner were higher so I advised him to use a higher regular insulin dose for lunch.  We also stopped his lunchtime NPH and decrease his evening NPH -At this visit, he tells me he cannot afford Ozempic - it already placed him in the doughnut hole.  -Therefore, I will advise him to increase his regular and NPH doses in the morning and also his bedtime NPH since sugars in the morning are above target.  Sent new prescriptions for insulin to his pharmacy.  He applied to a program to help him with Ozempic affordability.  He will let me know if he gets approved. -I advised him to:  Patient Instructions   Please increase: Insulin Before breakfast Before lunch Before dinner Bedtime  Regular 20-25 >> 30 units  15 units 30-35 units -  NPH 40 >> 45 units   50 >> 55 units   Let me know if we can restart Ozempic.  Please come back for a follow-up appointment in  3-4 months.   - today, HbA1c is 8.1% (higher) - continue checking sugars at different times of the day - check 3-4x a day, rotating checks - advised for yearly eye exams >> he is UTD - Return to clinic in 3-4 mo with sugar log      2. HL - Reviewed latest lipid panel from 08/2018: Triglycerides high, LDL at goal, HDL low Lab Results  Component Value Date  CHOL 176 09/08/2018   HDL 31.70 (L) 09/08/2018   LDLCALC 40 03/05/2016   LDLDIRECT 74.0 09/08/2018   TRIG (H) 09/08/2018    405.0 Triglyceride is over 400; calculations on Lipids are invalid.   CHOLHDL 6 09/08/2018  - Continues Zocor without side effects.  TriCor was also added recently.  3.  Obesity -No weight loss since last visit -She was exercising at [employer], but not now  as the gym is close due to the coronavirus pandemic. -He continues to improve his diet by cutting down portions and increasing the amount of vegetables -Unfortunately, we will need to stop Ozempic since this is not affordable.  Philemon Kingdom, MD PhD [facility] Endocrinology

## 2018-10-19 NOTE — Telephone Encounter (Signed)
error 

## 2018-10-19 NOTE — Addendum Note (Signed)
Addended by: Cardell Peach I on: 10/19/2018 09:09 AM   Modules accepted: Orders

## 2018-10-19 NOTE — Patient Instructions (Addendum)
Please increase: Insulin Before breakfast Before lunch Before dinner Bedtime  Regular 20-25 >> 30 units  15 units 30-35 units -  NPH 40 >> 45 units   50 >> 55 units   Let me know if we can restart Ozempic.  Please come back for a follow-up appointment in  3-4 months.

## 2018-10-20 ENCOUNTER — Telehealth: Payer: Self-pay | Admitting: *Deleted

## 2018-10-20 NOTE — Telephone Encounter (Signed)
Called patient for his pre-visit before colonoscopy on 11/03/18 with Dr.Nandigam. patient states that he does not want to do the colonoscopy at this time. He states he has been to his PCP and his sugar is high so he wants to wait. I did explain the colonoscopy is important because he had polyps. Encouraged patient to call us if he has any GI concerns/problems. Patient wants to wait for 6 months. I did put in epic 6 month recall-pt is aware. Appointments cancelled at this time per patient's request.

## 2018-10-24 ENCOUNTER — Other Ambulatory Visit: Payer: Self-pay

## 2018-10-24 NOTE — Patient Outreach (Signed)
  [facility]) Care Management Chronic Special Needs Program  10/24/2018  Name: LAN MCNEILL DOB: 1941-01-22  MRN: [id number]  Mr. Djon Tith is enrolled in a Chronic Special Needs Plan. RNCM called to follow up and review care plan. No answer. HIPPA compliant message left.   Plan: Chronic care management coordinator will attempt outreach in 1-2 weeks.  Thea Silversmith, RN, MSN, [place] [phone]

## 2018-10-30 ENCOUNTER — Other Ambulatory Visit: Payer: Self-pay

## 2018-10-30 NOTE — Patient Outreach (Signed)
  [place] [facility]) Care Management Chronic Special Needs Program    10/30/2018  Name: Eric Choi, DOB: 1940-10-21  MRN: [id number]   Mr. Aiyden La[place]back is enrolled in a chronic special needs plan. RNCM called to follow up and review care plan. No answer. HIPPA compliant message left. 2nd attempt.  Plan: Chronic care management coordinator will attempt outreach in 1-2 weeks if no return response.  Thea Silversmith, RN, MSN, [place] [phone]

## 2018-11-02 ENCOUNTER — Other Ambulatory Visit: Payer: Self-pay

## 2018-11-02 NOTE — Patient Outreach (Signed)
  [facility] [place] [facility]) Care Management Chronic Special Needs Program  11/02/2018  Name: LUISMANUEL CORMAN DOB: 02/05/1941  MRN: [id number]  Mr. Eric Choi is enrolled in a chronic special needs plan for Diabetes. Chronic Care Management Coordinator telephoned client to review health risk assessment and to develop individualized care plan.  Introduced the chronic care management program, importance of client participation, and taking their care plan to all provider appointments and inpatient facilities.    Two patient identifiers confirmed. Client request RNCM speak with is wife regarding health assessment except for the PHQ screening which client answered.   Subjective: Mrs. Inabinet reports client with a history of DM, HTN,HD,MI. She reports client is independent and follows up with providers as scheduled. Ms. Unangst states that client is unable to afford ozempic and has completed assistance forms at provider office. She reports it has been about two weeks. She denies any other questions or concerns.  Goals Addressed            This Visit's Progress   . Client understands the importance of follow-up with providers by attending scheduled visits   On track   . Client verbalizes knowledge of Heart Attack self management skills within the next 6 months.   On track   . Client will report no worsening of symptoms related to heart disease within the next 6-9 months    On track   . COMPLETED: Client will use Assistive Devices as needed and verbalize understanding of device use       Denies any issues or problems with use of glucometer.    . Client will verbalize knowledge of self management of Hypertension as evidences by BP reading of 140/90 or less; or as defined by provider   On track   . Maintain timely refills of diabetic medication as prescribed within the year .   On track   . COMPLETED: Obtain annual  Lipid Profile, LDL-C   On track    Completed 09/08/2018    . Obtain Annual Eye  (retinal)  Exam    On track   . Obtain Annual Foot Exam   On track   . Obtain annual screen for micro albuminuria (urine) , nephropathy (kidney problems)   On track   . Obtain Hemoglobin A1C at least 2 times per year   On track    Done 10/19/2018  8.1 Done 07/06/2018 7.8     . Visit Primary Care Provider or Endocrinologist at least 2 times per year    On track    Endocrinologist office visit: 07/06/2018; 10/19/2018 Primary care office visit:       Covid 19 precautions discussed. RNCM reinforced the 24 hour nurse advice line. RNCM encouraged client to contact health care concierge for benefit questions as needed. Client encouraged to call RNCM as needed/Confirmed he has contact number.  Plan: RNCM will outreach in 6 months. RNCM will refer client to: pharmacy for medication review and follow up regarding ozempic assistance as needed.   Thea Silversmith, RN, MSN, [place] [phone]

## 2018-11-03 ENCOUNTER — Encounter: Payer: HMO | Admitting: Gastroenterology

## 2018-11-07 ENCOUNTER — Other Ambulatory Visit: Payer: Self-pay | Admitting: Pharmacist

## 2018-11-07 ENCOUNTER — Telehealth: Payer: Self-pay | Admitting: Internal Medicine

## 2018-11-07 NOTE — Telephone Encounter (Signed)
Patients wife called to advise that the patience assistance program is telling her that there is a signature missing on the Ozempic paperwork.    Requesting a call back at [phone]

## 2018-11-07 NOTE — Patient Outreach (Signed)
[facility]) Care Management  [place]   11/07/2018  HAMZEH TALL Mar 28, 1941 [id number]  Reason for referral: Medication Assistance with Ozempic  Referral source: [employer] Care Manager with [facility] Current insurance: [employer] C-SNP  PMHx includes but not limited to:    Outreach:  Successful telephone call with Mr. Christopher.  HIPAA identifiers verified.  Patient requests that I speak with his spouse and gives permission.   Subjective:  Spouse reports an application for Ozempic was submitted via Dr. Arman Filter office (endocrinologist) in May.  Per office notes, confirmed application submitted on 10/06/18.  Spouse states she has not received any update from office or [employer].  Patient has been out of Ozempic for at least 1 month.   He checks blood sugars QID.    Objective: The 10-year ASCVD risk score Mikey Bussing DC Brooke Bonito., et al., 2013) is: 54.7%   Values used to calculate the score:     Age: 78 years     Sex: Male     Is Non-Hispanic African American: No     Diabetic: Yes     Tobacco smoker: No     Systolic Blood Pressure: 412 mmHg     Is BP treated: Yes     HDL Cholesterol: 31.7 mg/dL     Total Cholesterol: 176 mg/dL  Lab Results  Component Value Date   CREATININE 1.69 (H) 03/09/2018   CREATININE 1.31 03/11/2017   CREATININE 1.56 (H) 03/05/2016    Lab Results  Component Value Date   HGBA1C 8.1 (A) 10/19/2018    Lipid Panel     Component Value Date/Time   CHOL 176 09/08/2018 0813   TRIG (H) 09/08/2018 0813    405.0 Triglyceride is over 400; calculations on Lipids are invalid.   HDL 31.70 (L) 09/08/2018 0813   CHOLHDL 6 09/08/2018 0813   VLDL 62.4 (H) 03/09/2018 0733   LDLCALC 40 03/05/2016 0840   LDLDIRECT 74.0 09/08/2018 0813    BP Readings from Last 3 Encounters:  10/19/18 130/80  07/06/18 120/60  03/09/18 (!) 148/74    No Known Allergies  Medications Reviewed Today    Reviewed by Luretha Rued, RN  (Registered Nurse) on 11/02/18 at [place] List Status: <None>  Medication Order Taking? Sig Documenting Provider Last Dose Status Informant  amLODipine (NORVASC) 10 MG tablet [id number] Yes TAKE ONE TABLET BY MOUTH DAILY Nafziger, Tommi Rumps, NP Taking Active   aspirin 81 MG tablet [id number] Yes Take 81 mg by mouth daily.   [provider] Taking Active   Blood Glucose Monitoring Suppl (ONETOUCH VERIO) w/Device KIT [id number]  1 each by Does not apply route 4 (four) times daily. Philemon Kingdom, MD  Active   fenofibrate (TRICOR) 145 MG tablet [id number] Yes Take 1 tablet (145 mg total) by mouth daily. Nafziger, Tommi Rumps, NP Taking Active   glucose blood test strip [id number]  TEST BLOOD SUGAR  FOUR TIMES A DAY AND AS NEEDED Philemon Kingdom, MD  Active   insulin NPH Human (NOVOLIN N) 100 UNIT/ML injection [id number] Yes INJECT 52 - 63 UNITS UNDER THE SKIN BEFORE BREAKFAST AND Claris Che, MD Taking Active   insulin regular (NOVOLIN R) 100 units/mL injection [id number] Yes INJECT 15-30 UNITS THREE TIMES DAILY BEFORE MEALS AS DIRECTED Philemon Kingdom, MD Taking Active   Lancets Misc. (ACCU-CHEK MULTICLIX LANCET DEV) KIT [id number]  Use to check sugar 4 times daily Philemon Kingdom, MD  Active   lisinopril (PRINIVIL,ZESTRIL) 30 MG  tablet [id number]  Take 1 tablet (30 mg total) by mouth daily. [name], NP  Expired 06/07/18 2359   metoprolol tartrate (LOPRESSOR) 50 MG tablet [id number] Yes TAKE ONE TABLET BY MOUTH DAILY Nafziger, Tommi Rumps, NP Taking Active   NEEDLE, DISP, 30 G (B-D DISP NEEDLE 30GX1") 30G X 1" MISC [id number]  by Does not apply route as directed.   [provider]  Active   Semaglutide,0.25 or 0.5MG/DOS, (OZEMPIC, 0.25 OR 0.5 MG/DOSE,) 2 MG/1.5ML SOPN [id number] No Inject 0.5 mg into the skin once a week.  Patient not taking: Reported on 11/02/2018   Philemon Kingdom, MD Not Taking Active   simvastatin (ZOCOR) 40 MG tablet [id number] Yes Take 1 tablet (40 mg total) by  mouth at bedtime. Nafziger, Tommi Rumps, NP Taking Active           Assessment: Drugs sorted by system:  Hematologic: aspirin 3m  Cardiovascular: amlodipine, fenofibrate, lisinopril, metoprolol, simvastatin  Endocrine: Novolin N, Novolin R, semaglutide   Medication Assistance Findings:  Medication assistance needs identified: Ozempic  Extra Help:  -Spouse unable to confirm income today, unsure if patient qualifies  Patient Assistance Programs: Ozempic [place] by [place]: Yes o Out-of-pocket prescription expenditure met:   Not Applicable - Patient has already started application process through endocrinologist office.   - 3-way call placed to [employer]for update on application status.  [employer] representative to call spouse at home later today with update.  If no response, spouse will try to call again tomorrow morning.  - Reviewed that [employer] can provide Novolin N/R at no charge or patient could switch to other Novo products and apply at the same time.  Spouse declines this at this time.  States Patient will discuss this with provider at next visit.    Plan: . Will f/u with patient and spouse within 1 week to assist further if needed with application process for [place] PharmD, [phone]

## 2018-11-09 NOTE — Telephone Encounter (Signed)
All forms were signed. Will fax again.

## 2018-11-13 ENCOUNTER — Ambulatory Visit: Payer: Self-pay | Admitting: Pharmacist

## 2018-11-13 ENCOUNTER — Other Ambulatory Visit: Payer: Self-pay | Admitting: Pharmacist

## 2018-11-13 NOTE — Patient Outreach (Signed)
[facility]) Care Management  [place] 11/13/2018  Eric Choi [birth year] [id number]  Reason for call: f/u call regarding Ozempic patient assistance program  Successful call with patient's spouse who reports [employer] could not process application until page 7 was signed.  She contacted endocrinology office and page was re-submitted on Friday.  She has not had any update since then.  She will call [employer] later this week if needed.   Plan: I will f/u again with patient next week.   Ralene Bathe, PharmD, [place] [phone]    s

## 2018-11-15 ENCOUNTER — Telehealth: Payer: Self-pay

## 2018-11-15 NOTE — Telephone Encounter (Signed)
Received fax today that patient has been approved for [employer] patient assistance program through 04/16/2019.  A four month supply of the medication has been shipped to out clinic and should be here within 10-14 business days.  Patient wife notified.

## 2018-11-16 MED FILL — NovoLIN R 100 UNIT/ML SOLN: 100 | 30 days supply | Qty: 30 | Fill #1

## 2018-11-16 MED FILL — NovoLIN N 100 UNIT/ML SUSP: 100 | 27 days supply | Qty: 30 | Fill #1

## 2018-11-21 ENCOUNTER — Other Ambulatory Visit: Payer: Self-pay | Admitting: Pharmacist

## 2018-11-21 NOTE — Patient Outreach (Signed)
[facility]) Care Management [place]  11/21/2018  Eric Choi 08/04/1940 [id number]  Reason for referral: Medication assistance  Noted patient has been approved for Ozempic through [place] PAP through 04/16/2019.  Medication being shipped to endocrinology office.   [facility] pharmacy case is being closed due to the following reasons:  Goals have been met. -Patient has been provided [facility] CM contact information if assistance needed in the future.   -Thank you for allowing [facility] pharmacy to be involved in this patient's care.    Ralene Bathe, PharmD, [place] [phone]

## 2018-11-23 ENCOUNTER — Telehealth: Payer: Self-pay

## 2018-11-23 NOTE — Telephone Encounter (Signed)
Notified patient of message from Dr. Gherghe, patient expressed understanding and agreement. No further questions.  

## 2018-11-23 NOTE — Telephone Encounter (Signed)
Patient medication Ozempic has arrived and placed in Fridge.

## 2018-12-18 MED FILL — NovoLIN N 100 UNIT/ML SUSP: 100 | 27 days supply | Qty: 30 | Fill #2

## 2018-12-18 MED FILL — NovoLIN R 100 UNIT/ML SOLN: 100 | 33 days supply | Qty: 30 | Fill #2

## 2018-12-21 DIAGNOSIS — H43822 Vitreomacular adhesion, left eye: Secondary | ICD-10-CM | POA: Diagnosis not present

## 2018-12-21 DIAGNOSIS — H3563 Retinal hemorrhage, bilateral: Secondary | ICD-10-CM | POA: Diagnosis not present

## 2018-12-21 DIAGNOSIS — E113393 Type 2 diabetes mellitus with moderate nonproliferative diabetic retinopathy without macular edema, bilateral: Secondary | ICD-10-CM | POA: Diagnosis not present

## 2018-12-21 DIAGNOSIS — H35043 Retinal micro-aneurysms, unspecified, bilateral: Secondary | ICD-10-CM | POA: Diagnosis not present

## 2018-12-21 LAB — HM DIABETES EYE EXAM

## 2018-12-22 ENCOUNTER — Encounter: Payer: Self-pay | Admitting: Family Medicine

## 2018-12-31 ENCOUNTER — Other Ambulatory Visit: Payer: Self-pay | Admitting: Adult Health

## 2018-12-31 DIAGNOSIS — E785 Hyperlipidemia, unspecified: Secondary | ICD-10-CM

## 2019-01-03 NOTE — Telephone Encounter (Signed)
DENIED.  LAST FILLED ON 03/09/2018 FOR 1 YEAR.  REQUEST IS TOO EARLY.

## 2019-01-10 ENCOUNTER — Other Ambulatory Visit: Payer: Self-pay | Admitting: Adult Health

## 2019-01-10 DIAGNOSIS — E785 Hyperlipidemia, unspecified: Secondary | ICD-10-CM

## 2019-01-12 NOTE — Telephone Encounter (Signed)
DENIED.  FILLED ON 03/09/2018 FOR 1 YEAR.  REQUEST IS TOO EARLY.

## 2019-01-15 ENCOUNTER — Other Ambulatory Visit: Payer: Self-pay | Admitting: Adult Health

## 2019-01-16 ENCOUNTER — Other Ambulatory Visit: Payer: Self-pay | Admitting: Adult Health

## 2019-01-16 DIAGNOSIS — E785 Hyperlipidemia, unspecified: Secondary | ICD-10-CM

## 2019-01-17 MED FILL — NovoLIN R 100 UNIT/ML SOLN: 100 | 33 days supply | Qty: 30 | Fill #3

## 2019-01-17 MED FILL — NovoLIN N 100 UNIT/ML SUSP: 100 | 27 days supply | Qty: 30 | Fill #3

## 2019-02-20 ENCOUNTER — Other Ambulatory Visit: Payer: Self-pay

## 2019-02-20 MED FILL — NovoLIN R 100 UNIT/ML SOLN: 100 | 33 days supply | Qty: 30 | Fill #4

## 2019-02-20 MED FILL — NovoLIN N 100 UNIT/ML SUSP: 100 | 27 days supply | Qty: 30 | Fill #4

## 2019-02-22 ENCOUNTER — Ambulatory Visit: Payer: HMO | Admitting: Internal Medicine

## 2019-02-22 ENCOUNTER — Encounter: Payer: Self-pay | Admitting: Internal Medicine

## 2019-02-22 ENCOUNTER — Other Ambulatory Visit: Payer: Self-pay

## 2019-02-22 VITALS — BP 138/80 | HR 78 | Ht 67.75 in | Wt 237.0 lb

## 2019-02-22 DIAGNOSIS — E785 Hyperlipidemia, unspecified: Secondary | ICD-10-CM | POA: Diagnosis not present

## 2019-02-22 DIAGNOSIS — E1159 Type 2 diabetes mellitus with other circulatory complications: Secondary | ICD-10-CM | POA: Diagnosis not present

## 2019-02-22 DIAGNOSIS — E1165 Type 2 diabetes mellitus with hyperglycemia: Secondary | ICD-10-CM

## 2019-02-22 DIAGNOSIS — IMO0002 Reserved for concepts with insufficient information to code with codable children: Secondary | ICD-10-CM

## 2019-02-22 DIAGNOSIS — E669 Obesity, unspecified: Secondary | ICD-10-CM | POA: Diagnosis not present

## 2019-02-22 DIAGNOSIS — Z794 Long term (current) use of insulin: Secondary | ICD-10-CM

## 2019-02-22 LAB — POCT GLYCOSYLATED HEMOGLOBIN (HGB A1C): Hemoglobin A1C: 7.7 % — AB (ref 4.0–5.6)

## 2019-02-22 MED ORDER — OZEMPIC (1 MG/DOSE) 2 MG/1.5ML ~~LOC~~ SOPN: 1.0000 mg | PEN_INJECTOR | SUBCUTANEOUS | 3 refills | Status: DC

## 2019-02-22 NOTE — Progress Notes (Signed)
Patient ID: Eric Choi, male   DOB: 03/08/1941, 78 y.o.   MRN: [id number]  HPI: Eric Choi is a 78 y.o.-year-old male, returning for f/u for DM2 dx 1990, insulin-dependent since 2005, uncontrolled, with complications (CAD, CKD, PN, DR). Last visit was 4 months ago.  He c/o back pain with pain and weakness radiating down his legs >> fell few times >>  will see PCP at the end of the month and plans to discuss with him about this.  Last hemoglobin A1c was: Lab Results  Component Value Date   HGBA1C 8.1 (A) 10/19/2018   HGBA1C 7.8 (A) 07/06/2018   HGBA1C 9.2 (A) 03/03/2018   He is on:  Insulin Before breakfast Before lunch Before dinner Bedtime  Regular 30 units  10-15 units 30-35 units -  NPH 45 units   55 units   - Ozempic 0.5 mg weekly - restarted 11/2018  We tried U500 insulin but this was too expensive. He did not start Trulicity - 109$.  Pt checks his sugars 3-4 times a day per review of his meter download -14-day averages improved since adding Ozempic: - am:  113-197 >> 116, 120-193, 214 >> 104-161, 189 - 1-2h after b'fast: 106-279 >> n/c >> 65 >> 165 >> 182, 251 - before lunch:  65-152, 202, 219 >> 61, 79-214 >> 76, 104-232, 272 - 2h after lunch: 69-262 >> 234 >> 136-188 >> n/c  - before dinner: 184-250, 307 >> 74-177, 205 >> 94-200 >> 72-149 - 1-2h after dinner: 118-205, 240 >> 260 >> 162 >> n/c >> 150-211 - bedtime:  104, 177-255, 290 >> n/c >> 187 >> 131 >> n/c - nighttime: 105-234 >> 175-229 >> n/c Lowest CBG: 51 >> 122 >> 65 >> 61 >> 50s; he has hypoglycemia awareness in the 90s. Highest CBG: 324 >> 219 >> 214 >> 200.  Pt's meals are: - Breakfast: cereal + milk + eggs + bacon/sausage - Lunch: egg sandwich - Dinner: meat + veggies + starch - Snacks: 1-2: including after dinner; pears + mayonnaise, milk + crackers; cake  He drives a truck.  -+ Mild CKD, last BUN/creatinine:  Lab Results  Component Value Date   BUN 18 03/09/2018   CREATININE 1.69 (H)  03/09/2018  On lisinopril. -+ HL; last set of lipids: Lab Results  Component Value Date   CHOL 176 09/08/2018   HDL 31.70 (L) 09/08/2018   LDLCALC 40 03/05/2016   LDLDIRECT 74.0 09/08/2018   TRIG (H) 09/08/2018    405.0 Triglyceride is over 400; calculations on Lipids are invalid.   CHOLHDL 6 09/08/2018  On Zocor 40 and added fenofibrate 145 - last eye exam was in 2019: No DR, + cataracts. Dr. Radene Ou. -+ numbness and tingling in his feet.  He also has HTN.  Latest TSH was normal: Lab Results  Component Value Date   TSH 3.61 09/08/2018   He lost his twin brother to cancer 05/2018.  ROS: Constitutional: no weight gain/no weight loss, no fatigue, no subjective hyperthermia, no subjective hypothermia Eyes: no blurry vision, no xerophthalmia ENT: no sore throat, no nodules palpated in neck, no dysphagia, no odynophagia, no hoarseness Cardiovascular: no CP/no SOB/no palpitations/no leg swelling Respiratory: no cough/no SOB/no wheezing Gastrointestinal: no N/no V/no D/no C/no acid reflux Musculoskeletal: no muscle aches/+ joint aches (back) Skin: no rashes, no hair loss Neurological: no tremors/+ numbness/+ tingling/no dizziness  I reviewed pt's medications, allergies, PMH, social hx, family hx, and changes were documented in the history of present  illness. Otherwise, unchanged from my initial visit note.  Past Medical History:  Diagnosis Date  . CAD (coronary artery disease)   . Colon polyp   . DIABETES MELLITUS, TYPE II 11/03/2006  . HYPERLIPIDEMIA 11/03/2006  . HYPERTENSION 11/03/2006   Past Surgical History:  Procedure Laterality Date  . ANGIOPLASTY  20 years ago   percutaneous transluminal   . CATARACT EXTRACTION  2002, 2005   bilateral  . PTCA     Social History   Socioeconomic History  . Marital status: Married    Spouse name: Not on file  . Number of children: Not on file  . Years of education: Not on file  . Highest education level: Not on file   Occupational History  . Not on file  Social Needs  . Financial resource strain: Not on file  . Food insecurity    Worry: Not on file    Inability: Not on file  . Transportation needs    Medical: Not on file    Non-medical: Not on file  Tobacco Use  . Smoking status: Former Research scientist (life sciences)  . Smokeless tobacco: Never Used  Substance and Sexual Activity  . Alcohol use: No  . Drug use: No  . Sexual activity: Not on file  Lifestyle  . Physical activity    Days per week: Not on file    Minutes per session: Not on file  . Stress: Not on file  Relationships  . Social Herbalist on phone: Not on file    Gets together: Not on file    Attends religious service: Not on file    Active member of club or organization: Not on file    Attends meetings of clubs or organizations: Not on file    Relationship status: Not on file  . Intimate partner violence    Fear of current or ex partner: Not on file    Emotionally abused: Not on file    Physically abused: Not on file    Forced sexual activity: Not on file  Other Topics Concern  . Not on file  Social History Narrative   Works 3rd shift   Regular Exercise-yes   Current Outpatient Medications on File Prior to Visit  Medication Sig Dispense Refill  . amLODipine (NORVASC) 10 MG tablet TAKE ONE TABLET BY MOUTH DAILY 90 tablet 2  . aspirin 81 MG tablet Take 81 mg by mouth daily.      . Blood Glucose Monitoring Suppl (ONETOUCH VERIO) w/Device KIT 1 each by Does not apply route 4 (four) times daily. 1 kit 0  . fenofibrate (TRICOR) 145 MG tablet Take 1 tablet (145 mg total) by mouth daily. 90 tablet 2  . glucose blood test strip TEST BLOOD SUGAR  FOUR TIMES A DAY AND AS NEEDED 400 each 11  . insulin NPH Human (NOVOLIN N) 100 UNIT/ML injection INJECT 45 - 55 UNITS UNDER THE SKIN BEFORE BREAKFAST AND DINNER 30 mL 11  . insulin regular (NOVOLIN R) 100 units/mL injection INJECT 15-30 UNITS THREE TIMES DAILY BEFORE MEALS AS DIRECTED 30 mL 11  .  Lancets Misc. (ACCU-CHEK MULTICLIX LANCET DEV) KIT Use to check sugar 4 times daily 400 each 5  . lisinopril (ZESTRIL) 30 MG tablet TAKE ONE TABLET BY MOUTH DAILY 90 tablet 3  . metoprolol tartrate (LOPRESSOR) 50 MG tablet TAKE ONE TABLET BY MOUTH DAILY 90 tablet 2  . NEEDLE, DISP, 30 G (B-D DISP NEEDLE 30GX1") 30G X 1" MISC by Does  not apply route as directed.      . Semaglutide,0.25 or 0.5MG/DOS, (OZEMPIC, 0.25 OR 0.5 MG/DOSE,) 2 MG/1.5ML SOPN Inject 0.5 mg into the skin once a week. 2 pen 5  . simvastatin (ZOCOR) 40 MG tablet TAKE ONE TABLET BY MOUTH AT BEDTIME 90 tablet 3   No current facility-administered medications on file prior to visit.    No Known Allergies Family History  Problem Relation Age of Onset  . COPD Father   . Alcohol abuse Father   . Diabetes Father   . Cancer Sister        breast  . Colon cancer Neg Hx     PE: BP 138/80   Pulse 78   Ht 5' 7.75" (1.721 m) Comment: measured today WITHOUT shoes  Wt 237 lb (107.5 kg)   SpO2 96%   BMI 36.30 kg/m  Body mass index is 36.3 kg/m.  Wt Readings from Last 3 Encounters:  02/22/19 237 lb (107.5 kg)  10/19/18 237 lb (107.5 kg)  07/06/18 236 lb (107 kg)   Constitutional: overweight, in NAD Eyes: PERRLA, EOMI, no exophthalmos ENT: moist mucous membranes, no thyromegaly, no cervical lymphadenopathy Cardiovascular: RRR, No MRG Respiratory: CTA B Gastrointestinal: abdomen soft, NT, ND, BS+ Musculoskeletal: no deformities, strength intact in all 4 Skin: moist, warm, no rashes Neurological: no tremor with outstretched hands, DTR normal in all 4  ASSESSMENT: 1. DM2, uncontrolled, insulin-dep, with complications - CAD - CKD - PN - DR  - Tried to obtain U500 insulin but this was too expensive >> we had to go back to N and R insulin  2. HL  3.  Obesity  PLAN:  1. Patient with longstanding, uncontrolled, type 2 diabetes, on basal-bolus insulin regimen, with NPH and regular insulin due to price.  At last visit,  we added Ozempic as his sugars were higher, which he tried in the past and had significant success, but could not afford it anymore.  He now gets it through the patient assistance program.  At last visit, we also increased his a.m. NPH and regular insulin and bedtime NPH since the sugars in the morning are above target. - In the past, he tried a plant-based diet, which worked very well for him but unfortunately he came off.  He is trying to adjust portions and include more fruits and vegetables. -At this visit, his sugars are still fluctuating, despite adding Ozempic.  Overall, they are better, but he has significant hyperglycemic spikes after dietary indiscretions.  We discussed about reducing these, which he agrees to try.  She does mention few lows in the 50s usually around lunchtime when he is more active in the morning, but I do not see evidence of these in the last 2 weeks. -We will continue with the current dose of Ozempic, states he still has several pens at home.  However, when he is close to running out, I advised him to let me know so we can increase the dose to 1 mg weekly.  At that time, we probably will need to decrease the doses of his insulin.  Of note, he tolerates Ozempic well, without GI side effects. -I advised him to: Patient Instructions   Please continue: Insulin Before breakfast Before lunch Before dinner Bedtime  Regular 30 units  10-15 units 30-35 units -  NPH 45 units   55 units   Also, continue Ozempic 0.5 mg weekly. When you are close to running out let me know to send a prescription for  the higher dose, 1 mg.  Please watch your diet.  Please come back for a follow-up appointment in 3-4 months.   - we checked his HbA1c: 7.7% (lower) - advised to check sugars at different times of the day - 3x a day, rotating check times - advised for yearly eye exams >> he is UTD - return to clinic in 4 months      2. HL -Reviewed latest lipid panel from 08/2018: Triglycerides  high, LDL at goal, HDL low Lab Results  Component Value Date   CHOL 176 09/08/2018   HDL 31.70 (L) 09/08/2018   LDLCALC 40 03/05/2016   LDLDIRECT 74.0 09/08/2018   TRIG (H) 09/08/2018    405.0 Triglyceride is over 400; calculations on Lipids are invalid.   CHOLHDL 6 09/08/2018  -Continue Zocor and TriCor without side effects  3.  Obesity -No weight loss since last visit -She was exercising at [employer] before but stopped doing the coronavirus pandemic.  For now, he cannot exercise due to back pain radiating down his legs and also leg weakness -He continues to improve his diet by cutting down portions and increasing the amount of vegetables.  At this visit, we discussed about limiting dietary indiscretions. -At last visit, we were able to add back Ozempic, which he now gets to the patient assistance program.  This should also help with weight loss.  We are also planning to increase the dose.  Philemon Kingdom, MD PhD [facility]

## 2019-02-22 NOTE — Addendum Note (Signed)
Addended by: Cardell Peach I on: 02/22/2019 08:49 AM   Modules accepted: Orders

## 2019-02-22 NOTE — Patient Instructions (Signed)
Please continue: Insulin Before breakfast Before lunch Before dinner Bedtime  Regular 30 units  10-15 units 30-35 units -  NPH 45 units   55 units   Also, continue Ozempic 0.5 mg weekly. When you are close to running out let me know to send a prescription for the higher dose, 1 mg.  Please watch your diet.  Please come back for a follow-up appointment in 3-4 months.

## 2019-03-05 ENCOUNTER — Other Ambulatory Visit: Payer: Self-pay | Admitting: Adult Health

## 2019-03-15 ENCOUNTER — Encounter: Payer: PPO | Admitting: Adult Health

## 2019-03-19 ENCOUNTER — Telehealth: Payer: Self-pay

## 2019-03-19 NOTE — Telephone Encounter (Signed)
Notified patient's wife refill request has been faxed and we will contact them once it comes in.

## 2019-03-19 NOTE — Telephone Encounter (Signed)
Patients wife called in saying Dr. Rockey Situ them when patient was almost out of Semaglutide, 1 MG/DOSE, (OZEMPIC, 1 MG/DOSE,) 2 MG/1.5ML SOPN  To call in to get more    Please call and advise

## 2019-03-19 NOTE — Telephone Encounter (Signed)
Forms for refill of Ozempic completed and faxed to [employer] P[employer].

## 2019-03-21 MED FILL — NovoLIN N 100 UNIT/ML SUSP: 100 | 27 days supply | Qty: 30 | Fill #5

## 2019-03-30 ENCOUNTER — Telehealth: Payer: Self-pay

## 2019-03-30 NOTE — Telephone Encounter (Signed)
Spoke to patient and let him know he can pick them up Mon-Fri 8-5.

## 2019-03-30 NOTE — Telephone Encounter (Signed)
Ozempic shipment arrived and placed in Fridge for pick up

## 2019-03-31 ENCOUNTER — Other Ambulatory Visit: Payer: Self-pay | Admitting: Adult Health

## 2019-03-31 DIAGNOSIS — E785 Hyperlipidemia, unspecified: Secondary | ICD-10-CM

## 2019-04-04 ENCOUNTER — Other Ambulatory Visit: Payer: Self-pay | Admitting: Adult Health

## 2019-04-04 DIAGNOSIS — E785 Hyperlipidemia, unspecified: Secondary | ICD-10-CM

## 2019-04-04 NOTE — Telephone Encounter (Signed)
Sent to the pharmacy by e-scribe for 90 days.  Pt has upcoming cpx. 

## 2019-04-05 NOTE — Telephone Encounter (Signed)
DENIED.  FILLED ON 04/04/2019. MESSAGE SENT TO THE PHARMACY.

## 2019-04-05 NOTE — Telephone Encounter (Signed)
DENIED.  FILLED ON 04/04/2019.

## 2019-04-16 ENCOUNTER — Encounter: Payer: Self-pay | Admitting: Gastroenterology

## 2019-04-17 MED FILL — NovoLIN R 100 UNIT/ML SOLN: 100 | 33 days supply | Qty: 30 | Fill #5

## 2019-04-17 MED FILL — NovoLIN N 100 UNIT/ML SUSP: 100 | 27 days supply | Qty: 30 | Fill #6

## 2019-04-23 ENCOUNTER — Other Ambulatory Visit: Payer: Self-pay

## 2019-04-23 NOTE — Patient Outreach (Signed)
  [facility]) Care Management Chronic Special Needs Program  04/23/2019  Name: Eric Choi DOB: [birth year]  MRN: [id number]  Mr. Eric Choi is enrolled in a chronic special needs plan for Diabetes. Reviewed and updated care plan.  Subjective: RNCM spoke with client's wife per client's permission. Client was in the room and Eric Choi referred to him for some of the questions. Client reports he is able to obtain his medications and takes his medications as scheduled.Eric Choi states client does not miss any appointments. Last A1C 7.7 on 02/22/2019 down from 8.1 on 10/19/2018. Client/cargiver is without questions or concerns at this time.  Goals Addressed            This Visit's Progress   . COMPLETED: Client understands the importance of follow-up with providers by attending scheduled visits       Voiced importance of attending provider visits.    . COMPLETED: Client verbalizes knowledge of Heart Attack self management skills within the next 6 months.       Denies any issues, takes medications as prescribed, follows up with providers as scheduled, knows how to get in contact with providers.    . COMPLETED: Client will report no worsening of symptoms related to heart disease within the next 6-9 months        Reports no symptoms related to heart disease.    . COMPLETED: Client will verbalize knowledge of self management of Hypertension as evidences by BP reading of 140/90 or less; or as defined by provider       Takes medications as prescribed, attends provider visits as scheduled, monitors blood pressure at home, stays active.    . COMPLETED: Maintain timely refills of diabetic medication as prescribed within the year .       Denies any problems with obtaining medications.    . COMPLETED: Obtain Annual Eye (retinal)  Exam        Reports August 2020    . COMPLETED: Obtain Annual Foot Exam       Reports done per Endocrinologist this year.    . COMPLETED: Obtain  annual screen for micro albuminuria (urine) , nephropathy (kidney problems)       Eric Choi reports done per primary care this year.    . COMPLETED: Obtain Hemoglobin A1C at least 2 times per year       Done 10/19/2018  8.1 Done 07/06/2018 7.8     . COMPLETED: Visit Primary Care Provider or Endocrinologist at least 2 times per year        Endocrinologist office visit: 07/06/2018; 10/19/2018; 02/2019 Primary care office visit:       Covid 19 precautions discussed. RNCM encouraged client to call 24  Hour nurse advice line as needed. Also reinforced the availability of the health care concierge for benefits questions/in-network provider questions. RNCM encouraged client to call RNCM as needed.  Plan: RNCM will send updated care plan to primary care. Send updated care plan to client. RNCM will follow up within the next 9-12 months.   Thea Silversmith, RN, MSN, [place] [phone]

## 2019-05-21 MED FILL — NovoLIN N 100 UNIT/ML SUSP: 100 | 27 days supply | Qty: 30 | Fill #7

## 2019-05-21 MED FILL — NovoLIN R 100 UNIT/ML SOLN: 100 | 33 days supply | Qty: 30 | Fill #6

## 2019-06-05 ENCOUNTER — Other Ambulatory Visit: Payer: Self-pay

## 2019-06-05 ENCOUNTER — Ambulatory Visit: Payer: HMO

## 2019-06-05 ENCOUNTER — Encounter: Payer: Self-pay | Admitting: Adult Health

## 2019-06-05 ENCOUNTER — Ambulatory Visit (INDEPENDENT_AMBULATORY_CARE_PROVIDER_SITE_OTHER): Payer: HMO | Admitting: Adult Health

## 2019-06-05 ENCOUNTER — Other Ambulatory Visit: Payer: Self-pay | Admitting: Adult Health

## 2019-06-05 VITALS — BP 140/90 | HR 95 | Temp 97.3°F | Ht 67.75 in | Wt 235.0 lb

## 2019-06-05 DIAGNOSIS — I10 Essential (primary) hypertension: Secondary | ICD-10-CM | POA: Diagnosis not present

## 2019-06-05 DIAGNOSIS — I739 Peripheral vascular disease, unspecified: Secondary | ICD-10-CM | POA: Diagnosis not present

## 2019-06-05 DIAGNOSIS — M5442 Lumbago with sciatica, left side: Secondary | ICD-10-CM

## 2019-06-05 DIAGNOSIS — E669 Obesity, unspecified: Secondary | ICD-10-CM

## 2019-06-05 DIAGNOSIS — N401 Enlarged prostate with lower urinary tract symptoms: Secondary | ICD-10-CM

## 2019-06-05 DIAGNOSIS — IMO0002 Reserved for concepts with insufficient information to code with codable children: Secondary | ICD-10-CM

## 2019-06-05 DIAGNOSIS — E782 Mixed hyperlipidemia: Secondary | ICD-10-CM | POA: Diagnosis not present

## 2019-06-05 DIAGNOSIS — E1165 Type 2 diabetes mellitus with hyperglycemia: Secondary | ICD-10-CM | POA: Diagnosis not present

## 2019-06-05 DIAGNOSIS — E785 Hyperlipidemia, unspecified: Secondary | ICD-10-CM | POA: Diagnosis not present

## 2019-06-05 DIAGNOSIS — G8929 Other chronic pain: Secondary | ICD-10-CM

## 2019-06-05 DIAGNOSIS — E1159 Type 2 diabetes mellitus with other circulatory complications: Secondary | ICD-10-CM | POA: Diagnosis not present

## 2019-06-05 DIAGNOSIS — Z0000 Encounter for general adult medical examination without abnormal findings: Secondary | ICD-10-CM | POA: Diagnosis not present

## 2019-06-05 DIAGNOSIS — R351 Nocturia: Secondary | ICD-10-CM

## 2019-06-05 DIAGNOSIS — Z794 Long term (current) use of insulin: Secondary | ICD-10-CM

## 2019-06-05 DIAGNOSIS — E781 Pure hyperglyceridemia: Secondary | ICD-10-CM

## 2019-06-05 DIAGNOSIS — M5441 Lumbago with sciatica, right side: Secondary | ICD-10-CM

## 2019-06-05 LAB — LIPID PANEL
Cholesterol: 155 mg/dL (ref 0–200)
HDL: 34.3 mg/dL — ABNORMAL LOW (ref >39.00)
NonHDL: 121.17
Total CHOL/HDL Ratio: 5
Triglycerides: 320 mg/dL — ABNORMAL HIGH (ref 0.0–149.0)
VLDL: 64 mg/dL — ABNORMAL HIGH (ref 0.0–40.0)

## 2019-06-05 LAB — CBC WITH DIFFERENTIAL/PLATELET
Basophils Absolute: 0.1 10*3/uL (ref 0.0–0.1)
Basophils Relative: 0.8 % (ref 0.0–3.0)
Eosinophils Absolute: 0.5 10*3/uL (ref 0.0–0.7)
Eosinophils Relative: 3.7 % (ref 0.0–5.0)
HCT: 44.1 % (ref 39.0–52.0)
Hemoglobin: 14.7 g/dL (ref 13.0–17.0)
Lymphocytes Relative: 22.3 % (ref 12.0–46.0)
Lymphs Abs: 3.2 10*3/uL (ref 0.7–4.0)
MCHC: 33.2 g/dL (ref 30.0–36.0)
MCV: 89.9 fl (ref 78.0–100.0)
Monocytes Absolute: 1.4 10*3/uL — ABNORMAL HIGH (ref 0.1–1.0)
Monocytes Relative: 9.7 % (ref 3.0–12.0)
Neutro Abs: 9 10*3/uL — ABNORMAL HIGH (ref 1.4–7.7)
Neutrophils Relative %: 63.5 % (ref 43.0–77.0)
Platelets: 319 10*3/uL (ref 150.0–400.0)
RBC: 4.91 Mil/uL (ref 4.22–5.81)
RDW: 14.5 % (ref 11.5–15.5)
WBC: 14.2 10*3/uL — ABNORMAL HIGH (ref 4.0–10.5)

## 2019-06-05 LAB — COMPREHENSIVE METABOLIC PANEL
ALT: 21 U/L (ref 0–53)
AST: 19 U/L (ref 0–37)
Albumin: 4.5 g/dL (ref 3.5–5.2)
Alkaline Phosphatase: 37 U/L — ABNORMAL LOW (ref 39–117)
BUN: 38 mg/dL — ABNORMAL HIGH (ref 6–23)
CO2: 24 mEq/L (ref 19–32)
Calcium: 10.2 mg/dL (ref 8.4–10.5)
Chloride: 102 mEq/L (ref 96–112)
Creatinine, Ser: 2.86 mg/dL — ABNORMAL HIGH (ref 0.40–1.50)
GFR: 21.55 mL/min — ABNORMAL LOW (ref >60.00)
Glucose, Bld: 165 mg/dL — ABNORMAL HIGH (ref 70–99)
Potassium: 5.3 mEq/L — ABNORMAL HIGH (ref 3.5–5.1)
Sodium: 137 mEq/L (ref 135–145)
Total Bilirubin: 0.6 mg/dL (ref 0.2–1.2)
Total Protein: 7.3 g/dL (ref 6.0–8.3)

## 2019-06-05 LAB — LDL CHOLESTEROL, DIRECT: Direct LDL: 78 mg/dL

## 2019-06-05 LAB — TSH: TSH: 4.18 u[IU]/mL (ref 0.35–4.50)

## 2019-06-05 LAB — PSA: PSA: 1.03 ng/mL (ref 0.10–4.00)

## 2019-06-05 NOTE — Progress Notes (Signed)
Subjective:    Patient ID: Eric Choi, male    DOB: 04-Jul-1940, 79 y.o.   MRN: [id number]  HPI Patient presents for yearly preventative medicine examination. He is a pleasant 79 year old male who  has a past medical history of CAD (coronary artery disease), Colon polyp, DIABETES MELLITUS, TYPE II (11/03/2006), HYPERLIPIDEMIA (11/03/2006), and HYPERTENSION (11/03/2006).  Hypertension -currently prescribed Norvasc 10 mg, lisinopril 30 mg, metoprolol 50 mg.  He denies dizziness, lightheadedness, chest pain, shortness of breath, or syncopal episodes.  He does monitor his blood pressure at home and reports that his blood pressure is 120-130/70-80's.   BP Readings from Last 3 Encounters:  06/05/19 140/90  02/22/19 138/80  10/19/18 130/80   Hyperlipidemia - takes Zocor 40 mg and Fenofibrate 145 mg, he denies myalgia or fatigue  Lab Results  Component Value Date   CHOL 176 09/08/2018   HDL 31.70 (L) 09/08/2018   LDLCALC 40 03/05/2016   LDLDIRECT 74.0 09/08/2018   TRIG (H) 09/08/2018    405.0 Triglyceride is over 400; calculations on Lipids are invalid.   CHOLHDL 6 09/08/2018   DM -is managed by endocrinology.  Currently prescribed Novolin N 45 to 55 units before breakfast and lunch, Novolin R 15 to 30 units 3 times daily, and Ozempic 1 mg weekly.  He was last seen by endocrinology in October 2020 at which time he was restarted on Ozempic. Lab Results  Component Value Date   HGBA1C 7.7 (A) 02/22/2019   Low back pain with sciatica-he has a history of low back pain with numbness and tingling radiating down both legs.  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  Reports that the numbness and tingling as well as "weakness" keeps him from being active and exercising.  Per patient "I cannot walk 5 minutes without my legs becoming weak and tired".  He does report episodes of muscle cramping in his lower extremities during exertion.  All  immunizations and health maintenance protocols were reviewed with the patient and needed orders were placed.  Appropriate screening laboratory values were ordered for the patient including screening of hyperlipidemia, renal function and hepatic function. If indicated by BPH, a PSA was ordered.  Medication reconciliation,  past medical history, social history, problem list and allergies were reviewed in detail with the patient  Goals were established with regard to weight loss, exercise, and  diet in compliance with medications.  He has been cutting down on portions and increasing the amount of vegetables.  Wt Readings from Last 3 Encounters:  06/05/19 235 lb (106.6 kg)  02/22/19 237 lb (107.5 kg)  10/19/18 237 lb (107.5 kg)    End of life planning was discussed.   Review of Systems  Constitutional: Negative.   HENT: Negative.   Eyes: Negative.   Respiratory: Negative.   Cardiovascular: Negative.   Gastrointestinal: Negative.   Endocrine: Negative.   Genitourinary: Negative.   Musculoskeletal: Positive for arthralgias and back pain.  Skin: Negative.   Allergic/Immunologic: Negative.   Neurological: Positive for weakness and numbness.  Hematological: Negative.   Psychiatric/Behavioral: Negative.   All other systems reviewed and are negative.  Past Medical History:  Diagnosis Date  . CAD (coronary artery disease)   . Colon polyp   . DIABETES MELLITUS, TYPE II 11/03/2006  . HYPERLIPIDEMIA 11/03/2006  . HYPERTENSION 11/03/2006    Social History   Socioeconomic History  . Marital  status: Married    Spouse name: Not on file  . Number of children: Not on file  . Years of education: Not on file  . Highest education level: Not on file  Occupational History  . Not on file  Tobacco Use  . Smoking status: Former Research scientist (life sciences)  . Smokeless tobacco: Never Used  Substance and Sexual Activity  . Alcohol use: No  . Drug use: No  . Sexual activity: Not on file  Other Topics Concern    . Not on file  Social History Narrative   Works 3rd shift   Regular Exercise-yes   Social Determinants of Radio broadcast assistant Strain:   . Difficulty of Paying Living Expenses: Not on file  Food Insecurity:   . Worried About Charity fundraiser in the Last Year: Not on file  . Ran Out of Food in the Last Year: Not on file  Transportation Needs:   . Lack of Transportation (Medical): Not on file  . Lack of Transportation (Non-Medical): Not on file  Physical Activity:   . Days of Exercise per Week: Not on file  . Minutes of Exercise per Session: Not on file  Stress:   . Feeling of Stress : Not on file  Social Connections:   . Frequency of Communication with Friends and Family: Not on file  . Frequency of Social Gatherings with Friends and Family: Not on file  . Attends Religious Services: Not on file  . Active Member of Clubs or Organizations: Not on file  . Attends Archivist Meetings: Not on file  . Marital Status: Not on file  Intimate Partner Violence:   . Fear of Current or Ex-Partner: Not on file  . Emotionally Abused: Not on file  . Physically Abused: Not on file  . Sexually Abused: Not on file    Past Surgical History:  Procedure Laterality Date  . ANGIOPLASTY  20 years ago   percutaneous transluminal   . CATARACT EXTRACTION  2002, 2005   bilateral  . PTCA      Family History  Problem Relation Age of Onset  . COPD Father   . Alcohol abuse Father   . Diabetes Father   . Cancer Sister        breast  . Colon cancer Neg Hx     No Known Allergies  Current Outpatient Medications on File Prior to Visit  Medication Sig Dispense Refill  . amLODipine (NORVASC) 10 MG tablet TAKE ONE TABLET BY MOUTH DAILY 90 tablet 1  . aspirin 81 MG tablet Take 81 mg by mouth daily.      . Blood Glucose Monitoring Suppl (ONETOUCH VERIO) w/Device KIT 1 each by Does not apply route 4 (four) times daily. 1 kit 0  . fenofibrate (TRICOR) 145 MG tablet TAKE ONE  TABLET BY MOUTH DAILY 90 tablet 0  . FLUBLOK QUADRIVALENT 0.5 ML injection     . glucose blood test strip TEST BLOOD SUGAR  FOUR TIMES A DAY AND AS NEEDED 400 each 11  . insulin NPH Human (NOVOLIN N) 100 UNIT/ML injection INJECT 45 - 55 UNITS UNDER THE SKIN BEFORE BREAKFAST AND DINNER 30 mL 11  . insulin regular (NOVOLIN R) 100 units/mL injection INJECT 15-30 UNITS THREE TIMES DAILY BEFORE MEALS AS DIRECTED 30 mL 11  . Lancets Misc. (ACCU-CHEK MULTICLIX LANCET DEV) KIT Use to check sugar 4 times daily 400 each 5  . lisinopril (ZESTRIL) 30 MG tablet TAKE ONE TABLET BY MOUTH  DAILY 90 tablet 3  . metoprolol tartrate (LOPRESSOR) 50 MG tablet TAKE ONE TABLET BY MOUTH DAILY 90 tablet 0  . NEEDLE, DISP, 30 G (B-D DISP NEEDLE 30GX1") 30G X 1" MISC by Does not apply route as directed.      . Semaglutide, 1 MG/DOSE, (OZEMPIC, 1 MG/DOSE,) 2 MG/1.5ML SOPN Inject 1 mg into the skin once a week. 6 pen 3  . simvastatin (ZOCOR) 40 MG tablet TAKE ONE TABLET BY MOUTH AT BEDTIME 90 tablet 3   No current facility-administered medications on file prior to visit.    BP 140/90   Pulse 95   Temp (!) 97.3 F (36.3 C) (Other (Comment))   Ht 5' 7.75" (1.721 m)   Wt 235 lb (106.6 kg)   SpO2 97%   BMI 36.00 kg/m       Objective:   Physical Exam Vitals and nursing note reviewed.  Constitutional:      General: He is not in acute distress.    Appearance: Normal appearance. He is well-developed. He is obese. He is not diaphoretic.  HENT:     Head: Normocephalic and atraumatic.     Right Ear: Tympanic membrane, ear canal and external ear normal. There is impacted cerumen.     Left Ear: Tympanic membrane, ear canal and external ear normal. There is impacted cerumen.     Nose: Nose normal. No congestion or rhinorrhea.     Mouth/Throat:     Mouth: Mucous membranes are moist.     Dentition: Has dentures.     Pharynx: Oropharynx is clear. No oropharyngeal exudate or posterior oropharyngeal erythema.  Eyes:      General:        Right eye: No discharge.        Left eye: No discharge.     Conjunctiva/sclera: Conjunctivae normal.     Pupils: Pupils are equal, round, and reactive to light.  Neck:     Thyroid: No thyromegaly.     Trachea: No tracheal deviation.  Cardiovascular:     Rate and Rhythm: Normal rate and regular rhythm.     Heart sounds: Normal heart sounds. No murmur. No friction rub. No gallop.   Pulmonary:     Effort: Pulmonary effort is normal. No respiratory distress.     Breath sounds: Normal breath sounds. No stridor. No wheezing, rhonchi or rales.  Chest:     Chest wall: No tenderness.  Abdominal:     General: Bowel sounds are normal. There is no distension.     Palpations: Abdomen is soft. There is no mass.     Tenderness: There is no abdominal tenderness. There is no guarding or rebound.     Hernia: No hernia is present.  Musculoskeletal:        General: No swelling, tenderness, deformity or signs of injury. Normal range of motion.     Right lower leg: No edema.     Left lower leg: No edema.  Lymphadenopathy:     Cervical: No cervical adenopathy.  Skin:    General: Skin is warm and dry.     Capillary Refill: Capillary refill takes less than 2 seconds.     Coloration: Skin is not jaundiced or pale.     Findings: No bruising, erythema, lesion or rash.  Neurological:     General: No focal deficit present.     Mental Status: He is alert and oriented to person, place, and time.     Cranial Nerves: No cranial nerve  deficit.     Sensory: No sensory deficit.     Motor: No weakness.     Coordination: Coordination normal.     Gait: Gait normal.     Deep Tendon Reflexes: Reflexes normal.  Psychiatric:        Mood and Affect: Mood normal.        Behavior: Behavior normal.        Thought Content: Thought content normal.        Judgment: Judgment normal.       Assessment & Plan:  1. Routine general medical examination at a health care facility -Needs to lose  weight. -Follow-up in 6 months or sooner if needed - CBC with Differential/Platelet - Comprehensive metabolic panel - Lipid panel - TSH  2. Mixed hyperlipidemia -Consider Crestor or Lipitor.  Continue with fenofibrate - CBC with Differential/Platelet - Comprehensive metabolic panel - Lipid panel - TSH  3. BPH associated with nocturia - no symptoms currently  - CBC with Differential/Platelet - Comprehensive metabolic panel - Lipid panel - PSA - TSH  4. Essential hypertension -No changes in medication.  Better controlled at home - CBC with Differential/Platelet - Comprehensive metabolic panel - Lipid panel - TSH   5. Obesity with serious comorbidity, unspecified classification, unspecified obesity type -Continue to work on diet - CBC with Differential/Platelet - Comprehensive metabolic panel - Lipid panel - TSH  6. Uncontrolled type 2 diabetes mellitus with circulatory disorder, with long-term current use of insulin ([place]) -Follow-up with endocrinology as directed  7. Chronic bilateral low back pain with bilateral sciatica -Consider referral to neurosurgery - DG Lumbar Spine Complete; Future - DG Lumbar Spine Complete  8. Claudication [facility]) - Questionable symptoms.  Does have a history of elevated triglycerides, diabetes, hypertension. - Ambulatory referral to Vascular Surgery - CBC with Differential/Platelet - Comprehensive metabolic panel - Lipid panel - TSH  [name], NP

## 2019-06-05 NOTE — Patient Instructions (Addendum)
It was great seeing you today   I will follow up with you regarding your blood work   Someone will call you to schedule your appointment with vein and vascular.   Please call and schedule your colonoscopy.

## 2019-06-12 ENCOUNTER — Other Ambulatory Visit: Payer: Self-pay | Admitting: Adult Health

## 2019-06-19 MED FILL — NovoLIN R 100 UNIT/ML SOLN: 100 | 33 days supply | Qty: 30 | Fill #7

## 2019-06-20 MED FILL — NovoLIN N 100 UNIT/ML SUSP: 100 | 27 days supply | Qty: 30 | Fill #8

## 2019-06-29 ENCOUNTER — Other Ambulatory Visit: Payer: Self-pay | Admitting: Adult Health

## 2019-06-29 DIAGNOSIS — E785 Hyperlipidemia, unspecified: Secondary | ICD-10-CM

## 2019-07-05 ENCOUNTER — Ambulatory Visit: Payer: HMO | Admitting: Internal Medicine

## 2019-07-16 ENCOUNTER — Telehealth: Payer: Self-pay | Admitting: Adult Health

## 2019-07-16 NOTE — Chronic Care Management (AMB) (Signed)
Chronic Care Management   Note  07/16/2019 Name: Eric Choi MRN: [id number] DOB: 08/27/1940  Eric Choi is a 79 y.o. year old male who is a primary care patient of [name], NP. I reached out to [name] by phone today in response to a referral sent by Mr. [name]'s PCP, [name], NP. Wife, Eric Choi will be talking to Pharmacist, she handles husband Eric Choi medication.  [name] was given information about Chronic Care Management services today including:  1. CCM service includes personalized support from designated clinical staff supervised by his physician, including individualized plan of care and coordination with other care providers 2. 24/7 contact phone numbers for assistance for urgent and routine care needs. 3. Service will only be billed when office clinical staff spend 20 minutes or more in a month to coordinate care. 4. Only one practitioner may furnish and bill the service in a calendar month. 5. The patient may stop CCM services at any time (effective at the end of the month) by phone call to the office staff. 6. The patient will be responsible for cost sharing (co-pay) of up to 20% of the service fee (after annual deductible is met).  Patient agreed to services and verbal consent obtained.   Follow up plan:   Raynicia Dukes [employer] Scheduler

## 2019-07-17 ENCOUNTER — Other Ambulatory Visit: Payer: Self-pay | Admitting: Adult Health

## 2019-07-17 MED FILL — NovoLIN N 100 UNIT/ML SUSP: 100 | 27 days supply | Qty: 30 | Fill #9

## 2019-07-17 MED FILL — NovoLIN R 100 UNIT/ML SOLN: 100 | 33 days supply | Qty: 30 | Fill #8

## 2019-07-19 NOTE — Telephone Encounter (Signed)
Sent to the pharmacy by e-scribe. 

## 2019-07-20 ENCOUNTER — Other Ambulatory Visit: Payer: Self-pay

## 2019-07-23 ENCOUNTER — Encounter (HOSPITAL_COMMUNITY): Payer: HMO

## 2019-07-23 ENCOUNTER — Encounter: Payer: HMO | Admitting: Surgery

## 2019-07-24 ENCOUNTER — Telehealth: Payer: Self-pay

## 2019-07-24 ENCOUNTER — Other Ambulatory Visit: Payer: Self-pay

## 2019-07-24 ENCOUNTER — Ambulatory Visit: Payer: HMO | Admitting: Internal Medicine

## 2019-07-24 ENCOUNTER — Encounter: Payer: Self-pay | Admitting: Internal Medicine

## 2019-07-24 VITALS — BP 160/70 | HR 104 | Ht 67.75 in | Wt 236.0 lb

## 2019-07-24 DIAGNOSIS — I2510 Atherosclerotic heart disease of native coronary artery without angina pectoris: Secondary | ICD-10-CM

## 2019-07-24 DIAGNOSIS — E1159 Type 2 diabetes mellitus with other circulatory complications: Secondary | ICD-10-CM | POA: Diagnosis not present

## 2019-07-24 DIAGNOSIS — IMO0002 Reserved for concepts with insufficient information to code with codable children: Secondary | ICD-10-CM

## 2019-07-24 DIAGNOSIS — E1165 Type 2 diabetes mellitus with hyperglycemia: Secondary | ICD-10-CM | POA: Diagnosis not present

## 2019-07-24 DIAGNOSIS — Z794 Long term (current) use of insulin: Secondary | ICD-10-CM | POA: Diagnosis not present

## 2019-07-24 DIAGNOSIS — E785 Hyperlipidemia, unspecified: Secondary | ICD-10-CM | POA: Diagnosis not present

## 2019-07-24 DIAGNOSIS — E669 Obesity, unspecified: Secondary | ICD-10-CM | POA: Diagnosis not present

## 2019-07-24 LAB — POCT GLYCOSYLATED HEMOGLOBIN (HGB A1C): Hemoglobin A1C: 7.6 % — AB (ref 4.0–5.6)

## 2019-07-24 NOTE — Addendum Note (Signed)
Addended by: Cardell Peach I on: 07/24/2019 11:38 AM   Modules accepted: Orders

## 2019-07-24 NOTE — Progress Notes (Signed)
Patient ID: Eric Choi, male   DOB: 03-13-1941, 79 y.o.   MRN: [id number]  This visit occurred during the SARS-CoV-2 public health emergency.  Safety protocols were in place, including screening questions prior to the visit, additional usage of staff PPE, and extensive cleaning of exam room while observing appropriate contact time as indicated for disinfecting solutions.   HPI: Eric Choi is a 79 y.o.-year-old male, returning for f/u for DM2 dx 1990, insulin-dependent since 2005, uncontrolled, with complications (CAD, CKD, PN, DR). Last visit was 5 months ago.  Reviewed HbA1c levels: Lab Results  Component Value Date   HGBA1C 7.7 (A) 02/22/2019   HGBA1C 8.1 (A) 10/19/2018   HGBA1C 7.8 (A) 07/06/2018   He is on: Insulin Before breakfast Before lunch Before dinner Bedtime  Regular 30 units  10-15 units 30-35 units -  NPH 45 units   55 units    - Ozempic 0.5 mg weekly -restarted 11/2018 >> now on 1 mg weekly - Pt assistance pgm.  We tried U500 insulin but this was too expensive. He did not start Trulicity due to price - 145$.  Pt checks his sugars 3-4 times a day per review of his meter download: - am:  113-197 >> 116, 120-193, 214 >> 104-161, 189 >> 106-206 (sugars higher in the morning when he eats at night or snacks at bedtime) - 1-2h after b'fast: 106-279 >> n/c >> 65 >> 165 >> 182, 251 >> n/c - before lunch:  61, 79-214 >> 76, 104-232, 272 >> 93-115, 142 - 2h after lunch: 69-262 >> 234 >> 136-188 >> n/c >> n/c - before dinner: 74-177, 205 >> 94-200 >> 72-149 >> 116-169 - 1-2h after dinner: 118-205, 240 >> 260 >> 162 >> n/c >> 150-211 - bedtime:  104, 177-255, 290 >> n/c >> 187 >> 131 >> 72-140, 190 - nighttime: 105-234 >> 175-229 >> n/c Lowest CBG: 50s >> 72; he has hypoglycemia awareness in the 90s. Highest CBG: 324 >>... 200 >> 206.  Pt's meals are: - Breakfast: cereal + milk + eggs + bacon/sausage - Lunch: egg sandwich - Dinner: meat + veggies + starch - Snacks:  1-2: including after dinner; pears + mayonnaise, milk + crackers; cake  He drives a truck.  -+ CKD, last BUN/creatinine:  Lab Results  Component Value Date   BUN 38 (H) 06/05/2019   CREATININE 2.86 (H) 06/05/2019  On lisinopril. -+ HL; last set of lipids: Lab Results  Component Value Date   CHOL 155 06/05/2019   HDL 34.30 (L) 06/05/2019   LDLCALC 40 03/05/2016   LDLDIRECT 78.0 06/05/2019   TRIG 320.0 (H) 06/05/2019   CHOLHDL 5 06/05/2019  On Zocor 40, fenofibrate 145. - last eye exam was on 12/21/2018: + DR. He also has + cataracts. Dr. Radene Ou. -+ numbness and tingling in his feet.  He also has HTN.  Latest TSH is normal: Lab Results  Component Value Date   TSH 4.18 06/05/2019   He lost his twin brother to cancer 05/2018.  ROS: Constitutional: no weight gain/no weight loss, no fatigue, no subjective hyperthermia, no subjective hypothermia Eyes: no blurry vision, no xerophthalmia ENT: no sore throat, no nodules palpated in neck, no dysphagia, no odynophagia, no hoarseness Cardiovascular: no CP/no SOB/no palpitations/no leg swelling Respiratory: no cough/no SOB/no wheezing Gastrointestinal: no N/no V/no D/no C/no acid reflux Musculoskeletal: no muscle aches/+ joint aches Skin: no rashes, no hair loss Neurological: no tremors/+ numbness/+ tingling/no dizziness  I reviewed pt's medications, allergies, PMH, social  hx, family hx, and changes were documented in the history of present illness. Otherwise, unchanged from my initial visit note.  Past Medical History:  Diagnosis Date  . CAD (coronary artery disease)   . Colon polyp   . DIABETES MELLITUS, TYPE II 11/03/2006  . HYPERLIPIDEMIA 11/03/2006  . HYPERTENSION 11/03/2006   Past Surgical History:  Procedure Laterality Date  . ANGIOPLASTY  20 years ago   percutaneous transluminal   . CATARACT EXTRACTION  2002, 2005   bilateral  . PTCA     Social History   Socioeconomic History  . Marital status: Married    Spouse  name: Not on file  . Number of children: Not on file  . Years of education: Not on file  . Highest education level: Not on file  Occupational History  . Not on file  Tobacco Use  . Smoking status: Former Research scientist (life sciences)  . Smokeless tobacco: Never Used  Substance and Sexual Activity  . Alcohol use: No  . Drug use: No  . Sexual activity: Not on file  Other Topics Concern  . Not on file  Social History Narrative   Works 3rd shift   Regular Exercise-yes   Social Determinants of Radio broadcast assistant Strain:   . Difficulty of Paying Living Expenses: Not on file  Food Insecurity:   . Worried About Charity fundraiser in the Last Year: Not on file  . Ran Out of Food in the Last Year: Not on file  Transportation Needs:   . Lack of Transportation (Medical): Not on file  . Lack of Transportation (Non-Medical): Not on file  Physical Activity:   . Days of Exercise per Week: Not on file  . Minutes of Exercise per Session: Not on file  Stress:   . Feeling of Stress : Not on file  Social Connections:   . Frequency of Communication with Friends and Family: Not on file  . Frequency of Social Gatherings with Friends and Family: Not on file  . Attends Religious Services: Not on file  . Active Member of Clubs or Organizations: Not on file  . Attends Archivist Meetings: Not on file  . Marital Status: Not on file  Intimate Partner Violence:   . Fear of Current or Ex-Partner: Not on file  . Emotionally Abused: Not on file  . Physically Abused: Not on file  . Sexually Abused: Not on file   Current Outpatient Medications on File Prior to Visit  Medication Sig Dispense Refill  . amLODipine (NORVASC) 10 MG tablet TAKE ONE TABLET BY MOUTH DAILY 90 tablet 1  . aspirin 81 MG tablet Take 81 mg by mouth daily.      . Blood Glucose Monitoring Suppl (ONETOUCH VERIO) w/Device KIT 1 each by Does not apply route 4 (four) times daily. 1 kit 0  . fenofibrate (TRICOR) 145 MG tablet TAKE ONE  TABLET BY MOUTH DAILY 90 tablet 3  . FLUBLOK QUADRIVALENT 0.5 ML injection     . glucose blood test strip TEST BLOOD SUGAR  FOUR TIMES A DAY AND AS NEEDED 400 each 11  . insulin NPH Human (NOVOLIN N) 100 UNIT/ML injection INJECT 45 - 55 UNITS UNDER THE SKIN BEFORE BREAKFAST AND DINNER 30 mL 11  . insulin regular (NOVOLIN R) 100 units/mL injection INJECT 15-30 UNITS THREE TIMES DAILY BEFORE MEALS AS DIRECTED 30 mL 11  . Lancets Misc. (ACCU-CHEK MULTICLIX LANCET DEV) KIT Use to check sugar 4 times daily 400 each 5  .  lisinopril (ZESTRIL) 30 MG tablet TAKE ONE TABLET BY MOUTH DAILY 90 tablet 3  . metoprolol tartrate (LOPRESSOR) 50 MG tablet TAKE ONE TABLET BY MOUTH DAILY 90 tablet 1  . NEEDLE, DISP, 30 G (B-D DISP NEEDLE 30GX1") 30G X 1" MISC by Does not apply route as directed.      . Semaglutide, 1 MG/DOSE, (OZEMPIC, 1 MG/DOSE,) 2 MG/1.5ML SOPN Inject 1 mg into the skin once a week. 6 pen 3  . simvastatin (ZOCOR) 40 MG tablet TAKE ONE TABLET BY MOUTH AT BEDTIME 90 tablet 3   No current facility-administered medications on file prior to visit.   No Known Allergies Family History  Problem Relation Age of Onset  . COPD Father   . Alcohol abuse Father   . Diabetes Father   . Cancer Sister        breast  . Colon cancer Neg Hx     PE: BP (!) 160/70   Pulse (!) 104   Ht 5' 7.75" (1.721 m)   Wt 236 lb (107 kg)   SpO2 98%   BMI 36.15 kg/m  Body mass index is 36.15 kg/m.  Wt Readings from Last 3 Encounters:  07/24/19 236 lb (107 kg)  06/05/19 235 lb (106.6 kg)  02/22/19 237 lb (107.5 kg)   Constitutional: overweight, in NAD Eyes: PERRLA, EOMI, no exophthalmos ENT: moist mucous membranes, no thyromegaly, no cervical lymphadenopathy Cardiovascular: Tachycardia, RR, No MRG Respiratory: CTA B Gastrointestinal: abdomen soft, NT, ND, BS+ Musculoskeletal: no deformities, strength intact in all 4 Skin: moist, warm, no rashes Neurological: no tremor with outstretched hands, DTR normal in  all 4  ASSESSMENT: 1. DM2, uncontrolled, insulin-dep, with complications - CAD - CKD - PN - DR  - Tried to obtain U500 insulin but this was too expensive >> we had to go back to N and R insulin  2. HL  3.  Obesity class 2  PLAN:  1. Patient with longstanding, uncontrolled, type 2 diabetes, basal-insulin regimen with NPH and regular insulin 2/2 price.  He is now also on Ozempic.  He had significant success with this in the past but could not afford it anymore.  He now gets it through the patient assistance program. -In the past, he tried a plant-based diet, which worked very well for him, but unfortunately he came off.  He continues to adjust his diet to include more fruits and vegetables -At last visit, sugars are still fluctuating despite adding Ozempic.  He had hyperglycemic spikes after dietary indiscretions and I advised him to reduce these.  He had few low blood sugars in the 50s usually around lunchtime when he was more active in the morning, but not in the 2 weeks prior to our last visit.  Since then, we increased the dose to 1 mg weekly. -At this visit, sugars are still fluctuating depending on dietary indiscretions.  They are usually at goal in the morning, however, whenever she snacks at bedtime or during the night, they are in the 170-200 range in the morning.  We discussed about the importance of stopping snacks at night, but, if he does not, to take a low-dose of regular insulin for these.  He is planning to stop snacks. -Otherwise, I do not feel we need to change his insulin doses. -I advised him to: Patient Instructions   Please continue: Insulin Before breakfast Before lunch Before dinner Bedtime  Regular 30 units  10-15 units 30-35 units -  NPH 45 units   55  units   Also, continue Ozempic 1 mg weekly.  Stop the snack at night but if you do have a snack, may need to cover this with 5-10 units of regular insulin (R).  Please come back for a follow-up appointment in 4  months.   - we checked his HbA1c: 7.6% (slightly better) - advised to check sugars at different times of the day - 3x a day, rotating check times - advised for yearly eye exams >> he is UTD - return to clinic in 3-4 months    2. HL -Reviewed latest lipid panel from earlier this year: LDL slightly higher than goal, triglycerides high, HDL low Lab Results  Component Value Date   CHOL 155 06/05/2019   HDL 34.30 (L) 06/05/2019   LDLCALC 40 03/05/2016   LDLDIRECT 78.0 06/05/2019   TRIG 320.0 (H) 06/05/2019   CHOLHDL 5 06/05/2019  -Continues Zocor and TriCor without side effects  3.  Obesity class 2 -He was exercising at [employer] before the pandemic.  For now, he cannot exercise due to back pain radiating down his legs and also leg weakness -Continues to improve his diet by cutting down portions and increasing the amount of vegetables. -Also, continue Ozempic which should also help with weight loss -No significant weight loss since last visit  Philemon Kingdom, MD PhD [facility] Endocrinology

## 2019-07-24 NOTE — Telephone Encounter (Signed)
I am requesting an ambulatory referral to CCM be placed for this patient. Referral will need to have 2 current diagnosis attached to it. Thank you!  

## 2019-07-24 NOTE — Patient Instructions (Signed)
Please continue: Insulin Before breakfast Before lunch Before dinner Bedtime  Regular 30 units  10-15 units 30-35 units -  NPH 45 units   55 units   Also, continue Ozempic 1 mg weekly.  Stop the snack at night but if you do have a snack, may need to cover this with 5-10 units of regular insulin (R).  Please come back for a follow-up appointment in 4 months.

## 2019-07-27 ENCOUNTER — Ambulatory Visit: Payer: HMO

## 2019-07-27 ENCOUNTER — Other Ambulatory Visit: Payer: Self-pay

## 2019-07-27 DIAGNOSIS — E782 Mixed hyperlipidemia: Secondary | ICD-10-CM

## 2019-07-27 DIAGNOSIS — E1165 Type 2 diabetes mellitus with hyperglycemia: Secondary | ICD-10-CM

## 2019-07-27 DIAGNOSIS — I10 Essential (primary) hypertension: Secondary | ICD-10-CM

## 2019-07-27 DIAGNOSIS — E1159 Type 2 diabetes mellitus with other circulatory complications: Secondary | ICD-10-CM

## 2019-07-27 DIAGNOSIS — I2510 Atherosclerotic heart disease of native coronary artery without angina pectoris: Secondary | ICD-10-CM

## 2019-07-27 DIAGNOSIS — IMO0002 Reserved for concepts with insufficient information to code with codable children: Secondary | ICD-10-CM

## 2019-07-27 NOTE — Chronic Care Management (AMB) (Signed)
Chronic Care Management Pharmacy  Name: Eric Choi  MRN: [id number] DOB: 10-Apr-1941  Initial Questions: 1. Have you seen any other providers since your last visit? NA 2. Any changes in your medicines or health? No   Chief Complaint/ HPI  Eric Choi,  79 y.o. , male presents for their Initial CCM visit with the clinical pharmacist via telephone due to COVID-19 Pandemic.  PCP : [name], NP  Their chronic conditions include: DM, HTN, CAD, HLD, low back pain  Office Visits: 06/05/2019- Patient presented to [name], NP for yearly preventative exam. Patient instructed to lose weight. Follow up in 6 months or sooner if needed. Patient to obtain CBC, CMP, lipid panel, TSH. Patient to continue with fenofibrate. Considering Crestor or Lipitor. No changes in BP regimen; better controlled at home. Patient to continue with endocrinology. Referred to neurosurgery due to chronic bilateral low back pain with bilateral sciatica and referred to vascular surgery for claudication.   Consult Visit: 07/24/2019- Endocrinology- Patient presented for office visit with Dr. Layla Maw, MD. No medication changes. Patient to continue insulin regular, 30 units before breakfast, 10-15 units before lunch, and 30-35 units before dinner; insulin NPH 45 units before breakfast, 55 units at bedtime; and Ozempic 1 mg weekly. Patient to stop snacking at night or use 5-10 units of regular insulin to cover snack. Patient to continue with portion modifications and increasing vegetable intake.    02/22/2019-  Endocrinology- Patient presented for office visit with Dr. Layla Maw, MD. Recently added Ozempic. Patient still reports significant hyperglycemic spikes after dietary indiscretions. Patient to continue insulin regular, 30 units before breakfast, 10-15 units before lunch, and 30-35 units before dinner; insulin NPH 45 units before breakfast, 55 units at bedtime. Once he runs out of Ozempic 0.39m,  patient to start 116mweekly dose.   Medications: Outpatient Encounter Medications as of 07/27/2019  Medication Sig Note  . amLODipine (NORVASC) 10 MG tablet TAKE ONE TABLET BY MOUTH DAILY   . aspirin 81 MG tablet Take 81 mg by mouth daily.     . Blood Glucose Monitoring Suppl (ONETOUCH VERIO) w/Device KIT 1 each by Does not apply route 4 (four) times daily.   . fenofibrate (TRICOR) 145 MG tablet TAKE ONE TABLET BY MOUTH DAILY   . FLUBLOK QUADRIVALENT 0.5 ML injection    . glucose blood test strip TEST BLOOD SUGAR  FOUR TIMES A DAY AND AS NEEDED   . insulin NPH Human (NOVOLIN N) 100 UNIT/ML injection INJECT 45 - 55 UNITS UNDER THE SKIN BEFORE BREAKFAST AND DINNER 11/07/2018: 45 units in the morning, 50-55 units at bedtime  . insulin regular (NOVOLIN R) 100 units/mL injection INJECT 15-30 UNITS THREE TIMES DAILY BEFORE MEALS AS DIRECTED 11/07/2018: 30 units at breakfast, 15 units at lunch, 30 units at dinner  . Lancets Misc. (ACCU-CHEK MULTICLIX LANCET DEV) KIT Use to check sugar 4 times daily   . lisinopril (ZESTRIL) 30 MG tablet TAKE ONE TABLET BY MOUTH DAILY   . metoprolol tartrate (LOPRESSOR) 50 MG tablet TAKE ONE TABLET BY MOUTH DAILY   . NEEDLE, DISP, 30 G (B-D DISP NEEDLE 30GX1") 30G X 1" MISC by Does not apply route as directed.     . Semaglutide, 1 MG/DOSE, (OZEMPIC, 1 MG/DOSE,) 2 MG/1.5ML SOPN Inject 1 mg into the skin once a week.   . simvastatin (ZOCOR) 40 MG tablet TAKE ONE TABLET BY MOUTH AT BEDTIME    No facility-administered encounter medications on file as of 07/27/2019.  Current Diagnosis/Assessment:  Goals Addressed   None     Diabetes   Recent Relevant Labs: Lab Results  Component Value Date/Time   HGBA1C 7.6 (A) 07/24/2019 11:38 AM   HGBA1C 7.7 (A) 02/22/2019 08:49 AM   HGBA1C 7.4 (H) 10/10/2014 08:26 AM   HGBA1C 7.8 (H) 07/12/2014 08:20 AM   MICROALBUR 7.6 (H) 02/01/2014 08:01 AM   MICROALBUR 5.3 (H) 07/27/2013 08:05 AM    Checking BG: 3x per Day  No  reported BG readings.   Patient has failed these meds in past: Trulicity, metformin (CKD)  Patient is uncontrolled (but A1c improving) on following medications:  - insulin NPH, inject 45 units before breakfast, 55 units at bedtime - insulin R, inject 30 units before breakfast, 10-15 units before lunch, and 30-35 units before dinner - semaglutide (Ozempic), inject 26m once a weekly.   Last diabetic Eye exam:  Lab Results  Component Value Date/Time   HMDIABEYEEXA Retinopathy (A) 12/21/2018 12:00 AM  - Sees Dr. RRadene Ou  Last diabetic Foot exam:  Lab Results  Component Value Date/Time   HMDIABFOOTEX Done 12/27/2008 12:00 AM  - notes some numbness/ tingling in his feet  - discussed daily feet inspections   We discussed:  how to recognize and treat signs of hypoglycemia . Preventative diabetic health exams . Maintain a low carbohydrate diet, eating 45 grams of carbohydrates per meal (3 servings of carbohydrates per meal), 15 grams of carbohydrates per snack (1 serving of carbohydrate per snack).   Plan Managed by Dr. GRenne Crigler(endocrinologist).  Reemphasized need for insulin coverage for snacking. Spouse mentions patient has started to do this.  Continue current medications.   Hypertension   BP today is:  <140/90  Office blood pressures are  BP Readings from Last 3 Encounters:  07/24/19 (!) 160/70  06/05/19 140/90  02/22/19 138/80   Patient has failed these meds in the past: none   Patient checks BP at home daily  Patient home BP readings are ranging: 140/74 mmHg  Patient is managed on:  - amlodipine 183m 1 tablet daily - lisinopril 3070m1 tablet daily - metoprolol tartrate 37m52m tablet daily  We discussed diet and exercise extensively . DASH diet:  following a diet emphasizing fruits and vegetables and low-fat dairy products along with whole grains, fish, poultry, and nuts. Reducing red meats and sugars. (patient eats: 3 meals/day and tries to incorporate  vegetables).  . Reducing the amount of salt intake to <1500mg6m day.  . Recommend using a salt substitute to replace your salt if you need flavor.    . Weight reduction- We discussed losing 5-10% of body weight.   - activity limited due to back pain radiating down his legs.   Plan Denies dizziness /lightehadedness.   Continue current medications. Reports home BP around 140s.   Hyperlipidemia/ CAD   Lipid Panel     Component Value Date/Time   CHOL 155 06/05/2019 0925   TRIG 320.0 (H) 06/05/2019 0925   HDL 34.30 (L) 06/05/2019 0925   CHOLHDL 5 06/05/2019 0925   VLDL 64.0 (H) 06/05/2019 0925   LDLCALC 40 03/05/2016 0840   LDLDIRECT 78.0 06/05/2019 0925   Patient has failed these meds in past: ezetimibe (Zetia), Lipitor (spouse notes patient had issues with this several years ago)  Patient is currently uncontrolled on the following medications: - simvastatin 40mg,22mablet at bedtime  - fenofibrate 145mg, 37mblet daily  - ASA 81mg, 141mlet daily   We discussed:  diet  and exercise extensively . How to reduce cholesterol through diet/weight management and physical activity.    . We discussed how a diet high in plant sterols (fruits/vegetables/nuts/whole grains/legumes) may reduce your cholesterol.  Encouraged increasing fiber to a daily intake of 10-25g/day   Plan Since patient did not tolerate Lipitor in the past, recommend switch to Crestor (for higher intensity statin).  Lipid panel recheck on 4/19.   Continue current medications  Low back pain  Patient is currently uncontrolled on the following medications:  - no medications  Plan Patient states he will be seeing specialist next month for further management.   Medication Management  Patient organizes medications: patient uses pill box.  Barriers: denies issues obtaining medications. Process of completing PAP for insulin with endocrinology.   Follow up Follow up visit with PharmD in 6 months.  Will conduct general  telephone calls for periodic check-ins before next visit.  Patient has follow up labs on April 19th. Based on plan for statin change, recommend changing from simvastatin to Crestor (instead of Lipitor due to previous intolerance).   [name], PharmD Clinical Pharmacist [facility] [phone]

## 2019-07-30 ENCOUNTER — Telehealth: Payer: Self-pay

## 2019-07-30 NOTE — Telephone Encounter (Signed)
Forms for Novo Nordisk PAP renewal application filled out, signed by Dr. Cruzita Lederer and faxed to [employer] with confirmation.

## 2019-08-02 NOTE — Patient Instructions (Addendum)
Visit Information  Goals Addressed            This Visit's Progress   . Pharmacy Care Plan       CARE PLAN ENTRY  Current Barriers:  . Chronic Disease Management support, education, and care coordination needs related to CAD, HTN, HLD, DMII, and low back pain  Pharmacist Clinical Goal(s):  Marland Kitchen Diabetes  . Achieve A1c <7.0% or 8.0% (based on Dr. Renne Crigler) . Maintain up to date on diabetes preventative health (eye exams every 1 to 2 years and foot exams at least once yearly).  . Blood pressure . Maintain blood pressure within goal of your provider (130/80 or 140/90)  . Maintain low salt diet.  . High cholesterol:  . Cholesterol goals: Total Cholesterol goal under 200, Triglycerides goal under 150, HDL goal above 40 (men) or above 50 (women), LDL goal under 70.  Interventions: . Comprehensive medication review performed. . Diabetes . Discussed importance of diabetes preventative health exams. . Obtain diabetic eye exam every 1 to 2 years.  . Obtain at least once yearly foot exams. . Continue:  - insulin NPH, inject 45 units before breakfast, 55 units at bedtime - insulin R, inject 30 units before breakfast, 10-15 units before lunch, and 30-35 units before dinner - semaglutide (Ozempic), inject '1mg'$  once a weekly.  - If snacking, inject 5-10 units of regular insulin (R). . Blood pressure:  . DASH diet:  following a diet emphasizing fruits and vegetables and low-fat dairy products along with whole grains, fish, poultry, and nuts. Reducing red meats and sugars. (patient eats: 3 meals/day and tries to incorporate vegetables).  . Reducing the amount of salt intake to '1500mg'$ /per day.  . Recommend using a salt substitute to replace your salt if you need flavor.    . Weight reduction- We discussed losing 5-10% of body weight. . Continue . amlodipine '10mg'$ , 1 tablet daily . lisinopril '30mg'$ , 1 tablet daily . metoprolol tartrate '50mg'$ , 1 tablet daily . Cholesterol . How to reduce cholesterol  through diet/weight management and physical activity.    . We discussed how a diet high in plant sterols (fruits/vegetables/nuts/whole grains/legumes) may reduce your cholesterol.  Encouraged increasing fiber to a daily intake of 10-25g/day.  . Obtain repeat blood work on 09/03/2019.  Marland Kitchen Continue:  . simvastatin '40mg'$ , 1 tablet at bedtime  . fenofibrate '145mg'$ , 1 tablet daily  Patient Self Care Activities:  . Self administers medications as prescribed and Calls provider office for new concerns or questions . Continue current medications as directed by providers.  . Continue following up with specialists. . Continue at home blood pressure readings. . Continue checking blood sugars.  . Continue working on health habits (diet/ exercise).  Initial goal documentation        Eric Choi was given information about Chronic Care Management services today including:  1. CCM service includes personalized support from designated clinical staff supervised by his physician, including individualized plan of care and coordination with other care providers 2. 24/7 contact phone numbers for assistance for urgent and routine care needs. 3. Standard insurance, coinsurance, copays and deductibles apply for chronic care management only during months in which we provide at least 20 minutes of these services. Most insurances cover these services at 100%, however patients may be responsible for any copay, coinsurance and/or deductible if applicable. This service may help you avoid the need for more expensive face-to-face services. 4. Only one practitioner may furnish and bill the service in a calendar month.  5. The patient may stop CCM services at any time (effective at the end of the month) by phone call to the office staff.  Patient agreed to services and verbal consent obtained.   The patient verbalized understanding of instructions provided today and agreed to receive a mailed copy of patient instruction and/or  educational materials. Telephone follow up appointment with pharmacy team member scheduled for: 01/28/2020  [name], PharmD Clinical Pharmacist [facility] [phone]    High Cholesterol  High cholesterol is a condition in which the blood has high levels of a white, waxy, fat-like substance (cholesterol). The human body needs small amounts of cholesterol. The liver makes all the cholesterol that the body needs. Extra (excess) cholesterol comes from the food that we eat. Cholesterol is carried from the liver by the blood through the blood vessels. If you have high cholesterol, deposits (plaques) may build up on the walls of your blood vessels (arteries). Plaques make the arteries narrower and stiffer. Cholesterol plaques increase your risk for heart attack and stroke. Work with your health care provider to keep your cholesterol levels in a healthy range. What increases the risk? This condition is more likely to develop in people who:  Eat foods that are high in animal fat (saturated fat) or cholesterol.  Are overweight.  Are not getting enough exercise.  Have a family history of high cholesterol. What are the signs or symptoms? There are no symptoms of this condition. How is this diagnosed? This condition may be diagnosed from the results of a blood test.  If you are older than age 57, your health care provider may check your cholesterol every 4-6 years.  You may be checked more often if you already have high cholesterol or other risk factors for heart disease. The blood test for cholesterol measures:  "Bad" cholesterol (LDL cholesterol). This is the main type of cholesterol that causes heart disease. The desired level for LDL is less than 100.  "Good" cholesterol (HDL cholesterol). This type helps to protect against heart disease by cleaning the arteries and carrying the LDL away. The desired level for HDL is 60 or higher.  Triglycerides.  These are fats that the body can store or burn for energy. The desired number for triglycerides is lower than 150.  Total cholesterol. This is a measure of the total amount of cholesterol in your blood, including LDL cholesterol, HDL cholesterol, and triglycerides. A healthy number is less than 200. How is this treated? This condition is treated with diet changes, lifestyle changes, and medicines. Diet changes  This may include eating more whole grains, fruits, vegetables, nuts, and fish.  This may also include cutting back on red meat and foods that have a lot of added sugar. Lifestyle changes  Changes may include getting at least 40 minutes of aerobic exercise 3 times a week. Aerobic exercises include walking, biking, and swimming. Aerobic exercise along with a healthy diet can help you maintain a healthy weight.  Changes may also include quitting smoking. Medicines  Medicines are usually given if diet and lifestyle changes have failed to reduce your cholesterol to healthy levels.  Your health care provider may prescribe a statin medicine. Statin medicines have been shown to reduce cholesterol, which can reduce the risk of heart disease. Follow these instructions at home: Eating and drinking If told by your health care provider:  Eat chicken (without skin), fish, veal, shellfish, ground [place] breast, and round or loin cuts of red  meat.  Do not eat fried foods or fatty meats, such as hot dogs and salami.  Eat plenty of fruits, such as apples.  Eat plenty of vegetables, such as broccoli, potatoes, and carrots.  Eat beans, peas, and lentils.  Eat grains such as barley, rice, couscous, and bulgur wheat.  Eat pasta without cream sauces.  Use skim or nonfat milk, and eat low-fat or nonfat yogurt and cheeses.  Do not eat or drink whole milk, cream, ice cream, egg yolks, or hard cheeses.  Do not eat stick margarine or tub margarines that contain trans fats (also called partially  hydrogenated oils).  Do not eat saturated tropical oils, such as coconut oil and palm oil.  Do not eat cakes, cookies, crackers, or other baked goods that contain trans fats.  General instructions  Exercise as directed by your health care provider. Increase your activity level with activities such as gardening, walking, and taking the stairs.  Take over-the-counter and prescription medicines only as told by your health care provider.  Do not use any products that contain nicotine or tobacco, such as cigarettes and e-cigarettes. If you need help quitting, ask your health care provider.  Keep all follow-up visits as told by your health care provider. This is important. Contact a health care provider if:  You are struggling to maintain a healthy diet or weight.  You need help to start on an exercise program.  You need help to stop smoking. Get help right away if:  You have chest pain.  You have trouble breathing. This information is not intended to replace advice given to you by your health care provider. Make sure you discuss any questions you have with your health care provider. Document Revised: 05/06/2017 Document Reviewed: 11/01/2015 Elsevier Patient Education  [place].  Diabetes Mellitus and Standards of Medical Care Managing diabetes (diabetes mellitus) can be complicated. Your diabetes treatment may be managed by a team of health care providers, including:  A physician who specializes in diabetes (endocrinologist).  A nurse practitioner or physician assistant.  Nurses.  A diet and nutrition specialist (registered dietitian).  A certified diabetes educator (CDE).  An exercise specialist.  A pharmacist.  An eye doctor.  A foot specialist (podiatrist).  A dentist.  A primary care provider.  A mental health provider. Your health care providers follow guidelines to help you get the best quality of care. The following schedule is a general guideline  for your diabetes management plan. Your health care providers may give you more specific instructions. Physical exams Upon being diagnosed with diabetes mellitus, and each year after that, your health care provider will ask about your medical and family history. He or she will also do a physical exam. Your exam may include:  Measuring your height, weight, and body mass index (BMI).  Checking your blood pressure. This will be done at every routine medical visit. Your target blood pressure may vary depending on your medical conditions, your age, and other factors.  Thyroid gland exam.  Skin exam.  Screening for damage to your nerves (peripheral neuropathy). This may include checking the pulse in your legs and feet and checking the level of sensation in your hands and feet.  A complete foot exam to inspect the structure and skin of your feet, including checking for cuts, bruises, redness, blisters, sores, or other problems.  Screening for blood vessel (vascular) problems, which may include checking the pulse in your legs and feet and checking your temperature. Blood tests  Depending on your treatment plan and your personal needs, you may have the following tests done:  HbA1c (hemoglobin A1c). This test provides information about blood sugar (glucose) control over the previous 2-3 months. It is used to adjust your treatment plan, if needed. This test will be done: ? At least 2 times a year, if you are meeting your treatment goals. ? 4 times a year, if you are not meeting your treatment goals or if treatment goals have changed.  Lipid testing, including total, LDL, and HDL cholesterol and triglyceride levels. ? The goal for LDL is less than 100 mg/dL (5.5 mmol/L). If you are at high risk for complications, the goal is less than 70 mg/dL (3.9 mmol/L). ? The goal for HDL is 40 mg/dL (2.2 mmol/L) or higher for men and 50 mg/dL (2.8 mmol/L) or higher for women. An HDL cholesterol of 60 mg/dL (3.3  mmol/L) or higher gives some protection against heart disease. ? The goal for triglycerides is less than 150 mg/dL (8.3 mmol/L).  Liver function tests.  Kidney function tests.  Thyroid function tests. Dental and eye exams  Visit your dentist two times a year.  If you have type 1 diabetes, your health care provider may recommend an eye exam 3-5 years after you are diagnosed, and then once a year after your first exam. ? For children with type 1 diabetes, a health care provider may recommend an eye exam when your child is age 27 or older and has had diabetes for 3-5 years. After the first exam, your child should get an eye exam once a year.  If you have type 2 diabetes, your health care provider may recommend an eye exam as soon as you are diagnosed, and then once a year after your first exam. Immunizations   The yearly flu (influenza) vaccine is recommended for everyone 6 months or older who has diabetes.  The pneumonia (pneumococcal) vaccine is recommended for everyone 2 years or older who has diabetes. If you are 52 or older, you may get the pneumonia vaccine as a series of two separate shots.  The hepatitis B vaccine is recommended for adults shortly after being diagnosed with diabetes.  Adults and children with diabetes should receive all other vaccines according to age-specific recommendations from the Centers for Disease Control and Prevention (CDC). Mental and emotional health Screening for symptoms of eating disorders, anxiety, and depression is recommended at the time of diagnosis and afterward as needed. If your screening shows that you have symptoms (positive screening result), you may need more evaluation and you may work with a mental health care provider. Treatment plan Your treatment plan will be reviewed at every medical visit. You and your health care provider will discuss:  How you are taking your medicines, including insulin.  Any side effects you are  experiencing.  Your blood glucose target goals.  The frequency of your blood glucose monitoring.  Lifestyle habits, such as activity level as well as tobacco, alcohol, and substance use. Diabetes self-management education Your health care provider will assess how well you are monitoring your blood glucose levels and whether you are taking your insulin correctly. He or she may refer you to:  A certified diabetes educator to manage your diabetes throughout your life, starting at diagnosis.  A registered dietitian who can create or review your personal nutrition plan.  An exercise specialist who can discuss your activity level and exercise plan. Summary  Managing diabetes (diabetes mellitus) can be complicated. Your diabetes  treatment may be managed by a team of health care providers.  Your health care providers follow guidelines in order to help you get the best quality of care.  Standards of care including having regular physical exams, blood tests, blood pressure monitoring, immunizations, screening tests, and education about how to manage your diabetes.  Your health care providers may also give you more specific instructions based on your individual health. This information is not intended to replace advice given to you by your health care provider. Make sure you discuss any questions you have with your health care provider. Document Revised: 01/20/2018 Document Reviewed: 01/30/2016 Elsevier Patient Education  [place].

## 2019-08-15 MED FILL — NovoLIN R 100 UNIT/ML SOLN: 100 | 33 days supply | Qty: 30 | Fill #9

## 2019-08-15 MED FILL — NovoLIN N 100 UNIT/ML SUSP: 100 | 27 days supply | Qty: 30 | Fill #10

## 2019-08-23 ENCOUNTER — Other Ambulatory Visit: Payer: Self-pay | Admitting: *Deleted

## 2019-08-23 ENCOUNTER — Telehealth (HOSPITAL_COMMUNITY): Payer: Self-pay

## 2019-08-23 DIAGNOSIS — M79604 Pain in right leg: Secondary | ICD-10-CM

## 2019-08-23 NOTE — Telephone Encounter (Signed)

## 2019-08-27 ENCOUNTER — Encounter: Payer: Self-pay | Admitting: Surgery

## 2019-08-27 ENCOUNTER — Ambulatory Visit: Payer: HMO | Admitting: Surgery

## 2019-08-27 ENCOUNTER — Ambulatory Visit (HOSPITAL_COMMUNITY)
Admission: RE | Admit: 2019-08-27 | Discharge: 2019-08-27 | Disposition: A | Discharge status: Home / Self Care | Payer: HMO | Source: Ambulatory Visit | Attending: Surgery | Admitting: Surgery

## 2019-08-27 ENCOUNTER — Other Ambulatory Visit: Payer: Self-pay

## 2019-08-27 VITALS — BP 132/70 | HR 84 | Temp 97.8°F | Resp 20 | Ht 67.5 in | Wt 234.0 lb

## 2019-08-27 DIAGNOSIS — M79604 Pain in right leg: Secondary | ICD-10-CM

## 2019-08-27 DIAGNOSIS — I70213 Atherosclerosis of native arteries of extremities with intermittent claudication, bilateral legs: Secondary | ICD-10-CM

## 2019-08-27 MED ORDER — CILOSTAZOL 100 MG PO TABS: 100.0000 mg | ORAL_TABLET | Freq: Two times a day (BID) | ORAL | 11 refills | Status: DC

## 2019-08-27 NOTE — Progress Notes (Signed)
[facility]  Patient name: Eric Choi MRN: [id number] DOB: 1940/10/22 Sex: male   REQUESTING PROVIDER:    Dr. Carlisle Cater   REASON FOR CONSULT:    Possible claudication  HISTORY OF PRESENT ILLNESS:   Eric Choi is a 79 y.o. male, who is referred for evaluation of possible claudication.  The patient states that for the past several years, he has been having difficulty with walking.  This is been getting worse to where he can now only go about a half a block.  At that distance he gets weak and if he feels like his ankles are going to give out and so he has to sit down.  After resting for 2 or 3 minutes he can get up and go again.  His walking has also slowed down because of shortness of breath.  He states that he feels the left leg bothers him the most.  The patient has a history of coronary artery disease, status post MI in the past.  He is a diabetic with a hemoglobin A1c around 7.  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  He is a former smoker.  PAST MEDICAL HISTORY    Past Medical History:  Diagnosis Date  . CAD (coronary artery disease)   . Colon polyp   . DIABETES MELLITUS, TYPE II 11/03/2006  . HYPERLIPIDEMIA 11/03/2006  . HYPERTENSION 11/03/2006     FAMILY HISTORY   Family History  Problem Relation Age of Onset  . COPD Father   . Alcohol abuse Father   . Diabetes Father   . Cancer Sister        breast  . Colon cancer Neg Hx     SOCIAL HISTORY:   Social History   Socioeconomic History  . Marital status: Married    Spouse name: Not on file  . Number of children: Not on file  . Years of education: Not on file  . Highest education level: Not on file  Occupational History  . Not on file  Tobacco Use  . Smoking status: Former Research scientist (life sciences)  . Smokeless tobacco: Never Used  Substance and Sexual Activity  . Alcohol use: No  . Drug use: No  . Sexual activity: Not on file  Other  Topics Concern  . Not on file  Social History Narrative   Works 3rd shift   Regular Exercise-yes   Social Determinants of Radio broadcast assistant Strain:   . Difficulty of Paying Living Expenses:   Food Insecurity:   . Worried About Charity fundraiser in the Last Year:   . Arboriculturist in the Last Year:   Transportation Needs:   . Film/video editor (Medical):   Marland Kitchen Lack of Transportation (Non-Medical):   Physical Activity:   . Days of Exercise per Week:   . Minutes of Exercise per Session:   Stress:   . Feeling of Stress :   Social Connections:   . Frequency of Communication with Friends and Family:   . Frequency of Social Gatherings with Friends and Family:   . Attends Religious Services:   . Active Member of Clubs or Organizations:   . Attends Archivist Meetings:   Marland Kitchen Marital Status:   Intimate Partner Violence:   . Fear of Current or Ex-Partner:   . Emotionally Abused:   Marland Kitchen Physically Abused:   . Sexually Abused:     ALLERGIES:    No Known  Allergies  CURRENT MEDICATIONS:    Current Outpatient Medications  Medication Sig Dispense Refill  . amLODipine (NORVASC) 10 MG tablet TAKE ONE TABLET BY MOUTH DAILY 90 tablet 1  . aspirin 81 MG tablet Take 81 mg by mouth daily.      . Blood Glucose Monitoring Suppl (ONETOUCH VERIO) w/Device KIT 1 each by Does not apply route 4 (four) times daily. 1 kit 0  . fenofibrate (TRICOR) 145 MG tablet TAKE ONE TABLET BY MOUTH DAILY 90 tablet 3  . FLUBLOK QUADRIVALENT 0.5 ML injection     . glucose blood test strip TEST BLOOD SUGAR  FOUR TIMES A DAY AND AS NEEDED 400 each 11  . insulin NPH Human (NOVOLIN N) 100 UNIT/ML injection INJECT 45 - 55 UNITS UNDER THE SKIN BEFORE BREAKFAST AND DINNER 30 mL 11  . insulin regular (NOVOLIN R) 100 units/mL injection INJECT 15-30 UNITS THREE TIMES DAILY BEFORE MEALS AS DIRECTED 30 mL 11  . Lancets Misc. (ACCU-CHEK MULTICLIX LANCET DEV) KIT Use to check sugar 4 times daily 400  each 5  . lisinopril (ZESTRIL) 30 MG tablet TAKE ONE TABLET BY MOUTH DAILY 90 tablet 3  . metoprolol tartrate (LOPRESSOR) 50 MG tablet TAKE ONE TABLET BY MOUTH DAILY 90 tablet 1  . NEEDLE, DISP, 30 G (B-D DISP NEEDLE 30GX1") 30G X 1" MISC by Does not apply route as directed.      . Semaglutide, 1 MG/DOSE, (OZEMPIC, 1 MG/DOSE,) 2 MG/1.5ML SOPN Inject 1 mg into the skin once a week. 6 pen 3  . simvastatin (ZOCOR) 40 MG tablet TAKE ONE TABLET BY MOUTH AT BEDTIME 90 tablet 3   No current facility-administered medications for this visit.    REVIEW OF SYSTEMS:   [X]  denotes positive finding, [ ]  denotes negative finding Cardiac  Comments:  Chest pain or chest pressure:    Shortness of breath upon exertion:    Short of breath when lying flat:    Irregular heart rhythm:        Vascular    Pain in calf, thigh, or hip brought on by ambulation: x   Pain in feet at night that wakes you up from your sleep:     Blood clot in your veins:    Leg swelling:         Pulmonary    Oxygen at home:    Productive cough:     Wheezing:         Neurologic    Sudden weakness in arms or legs:     Sudden numbness in arms or legs:     Sudden onset of difficulty speaking or slurred speech:    Temporary loss of vision in one eye:     Problems with dizziness:         Gastrointestinal    Blood in stool:      Vomited blood:         Genitourinary    Burning when urinating:     Blood in urine:        Psychiatric    Major depression:         Hematologic    Bleeding problems:    Problems with blood clotting too easily:        Skin    Rashes or ulcers:        Constitutional    Fever or chills:     PHYSICAL EXAM:   There were no vitals filed for this visit.  GENERAL: The patient  is a well-nourished male, in no acute distress. The vital signs are documented above. CARDIAC: There is a regular rate and rhythm.  VASCULAR: Nonpalpable pedal pulses PULMONARY: Nonlabored  respirations MUSCULOSKELETAL: There are no major deformities or cyanosis. NEUROLOGIC: No focal weakness or paresthesias are detected. SKIN: There are no ulcers or rashes noted. PSYCHIATRIC: The patient has a normal affect.  STUDIES:   I have reviewed the following studies: ABI Findings:  +---------+------------------+-----+----------+--------+  Right  Rt Pressure (mmHg)IndexWaveform Comment   +---------+------------------+-----+----------+--------+  Brachial 150                      +---------+------------------+-----+----------+--------+  PTA   68        0.45 monophasic      +---------+------------------+-----+----------+--------+  DP    69        0.46 monophasic      +---------+------------------+-----+----------+--------+  Great Toe98        0.65 Abnormal       +---------+------------------+-----+----------+--------+   +---------+------------------+-----+---------+-------+  Left   Lt Pressure (mmHg)IndexWaveform Comment  +---------+------------------+-----+---------+-------+  Brachial 140                      +---------+------------------+-----+---------+-------+  PTA   149        0.99 triphasic      +---------+------------------+-----+---------+-------+  DP    132        0.88 biphasic       +---------+------------------+-----+---------+-------+  Great Toe107        0.71 Normal        +---------+------------------+-----+---------+-------+   ASSESSMENT and PLAN   PAD: It is difficult to tease out exactly what the underlying problems are with his legs.  By ultrasound, he has near normal blood flow to his left leg and approximately 50% to the right.  However he feels that the left leg bothers him the most and that the right leg only minimally contributes to his symptoms.  This would suggest that arterial  insufficiency is not the underlying cause of his pain and walking abnormalities.  Regardless, he has known vascular disease and so I think adding cilostazol to his medical regimen to see if he has any improvement in his symptoms is reasonable.  I have also encouraged him to pursue a work-up of his lower back to see if he has any degenerative back issues that could be contributing.  He has been scheduled for x-ray but canceled.  I have encouraged him to reschedule that.  I will see him back in 3 months for repeat evaluation.   Leia Alf, MD, FACS [facility] [phone] Pager [phone]

## 2019-08-28 ENCOUNTER — Other Ambulatory Visit: Payer: Self-pay | Admitting: *Deleted

## 2019-08-28 DIAGNOSIS — I70213 Atherosclerosis of native arteries of extremities with intermittent claudication, bilateral legs: Secondary | ICD-10-CM

## 2019-08-31 ENCOUNTER — Other Ambulatory Visit: Payer: Self-pay

## 2019-09-03 ENCOUNTER — Other Ambulatory Visit (INDEPENDENT_AMBULATORY_CARE_PROVIDER_SITE_OTHER): Payer: HMO

## 2019-09-03 ENCOUNTER — Telehealth: Payer: Self-pay | Admitting: Adult Health

## 2019-09-03 ENCOUNTER — Other Ambulatory Visit: Payer: Self-pay

## 2019-09-03 DIAGNOSIS — E781 Pure hyperglyceridemia: Secondary | ICD-10-CM

## 2019-09-03 LAB — LIPID PANEL
Cholesterol: 120 mg/dL (ref 0–200)
HDL: 30.6 mg/dL — ABNORMAL LOW (ref >39.00)
LDL Cholesterol: 52 mg/dL (ref 0–99)
NonHDL: 89.47
Total CHOL/HDL Ratio: 4
Triglycerides: 186 mg/dL — ABNORMAL HIGH (ref 0.0–149.0)
VLDL: 37.2 mg/dL (ref 0.0–40.0)

## 2019-09-03 LAB — COMPREHENSIVE METABOLIC PANEL
ALT: 17 U/L (ref 0–53)
AST: 20 U/L (ref 0–37)
Albumin: 4.1 g/dL (ref 3.5–5.2)
Alkaline Phosphatase: 34 U/L — ABNORMAL LOW (ref 39–117)
BUN: 28 mg/dL — ABNORMAL HIGH (ref 6–23)
CO2: 25 mEq/L (ref 19–32)
Calcium: 9.1 mg/dL (ref 8.4–10.5)
Chloride: 105 mEq/L (ref 96–112)
Creatinine, Ser: 2.88 mg/dL — ABNORMAL HIGH (ref 0.40–1.50)
GFR: 21.36 mL/min — ABNORMAL LOW (ref >60.00)
Glucose, Bld: 76 mg/dL (ref 70–99)
Potassium: 4.7 mEq/L (ref 3.5–5.1)
Sodium: 138 mEq/L (ref 135–145)
Total Bilirubin: 0.4 mg/dL (ref 0.2–1.2)
Total Protein: 6.4 g/dL (ref 6.0–8.3)

## 2019-09-03 NOTE — Telephone Encounter (Signed)
Patient is interested in scheduling a back xray he states per [facility].

## 2019-09-04 ENCOUNTER — Other Ambulatory Visit: Payer: Self-pay | Admitting: Adult Health

## 2019-09-04 ENCOUNTER — Other Ambulatory Visit: Payer: Self-pay

## 2019-09-04 DIAGNOSIS — G8929 Other chronic pain: Secondary | ICD-10-CM

## 2019-09-04 DIAGNOSIS — M5440 Lumbago with sciatica, unspecified side: Secondary | ICD-10-CM

## 2019-09-04 NOTE — Telephone Encounter (Signed)
Please Advise

## 2019-09-04 NOTE — Telephone Encounter (Signed)
I have placed the order for his xray he just needs to call and schedule.   His labs showed that his cholesterol levels are better.   His kidney function continues to be lower than what it has been in the past. I would like to refer him to a kidney doctor.

## 2019-09-10 ENCOUNTER — Other Ambulatory Visit: Payer: Self-pay | Admitting: Internal Medicine

## 2019-09-17 MED FILL — NovoLIN N 100 UNIT/ML SUSP: 100 | 27 days supply | Qty: 30 | Fill #11

## 2019-09-17 MED FILL — NovoLIN R 100 UNIT/ML SOLN: 100 | 33 days supply | Qty: 30 | Fill #10

## 2019-10-18 ENCOUNTER — Other Ambulatory Visit: Payer: Self-pay | Admitting: Internal Medicine

## 2019-10-18 MED FILL — NovoLIN N 100 UNIT/ML SUSP: 100 | 27 days supply | Qty: 30 | Fill #0

## 2019-10-18 MED FILL — NovoLIN R 100 UNIT/ML SOLN: 100 | 33 days supply | Qty: 30 | Fill #11

## 2019-11-15 ENCOUNTER — Other Ambulatory Visit: Payer: Self-pay | Admitting: Internal Medicine

## 2019-11-15 MED FILL — NovoLIN R 100 UNIT/ML SOLN: 100 | 33 days supply | Qty: 30 | Fill #0

## 2019-11-15 MED FILL — NovoLIN N 100 UNIT/ML SUSP: 100 | 27 days supply | Qty: 30 | Fill #1

## 2019-11-26 ENCOUNTER — Ambulatory Visit (INDEPENDENT_AMBULATORY_CARE_PROVIDER_SITE_OTHER): Payer: HMO | Admitting: Surgery

## 2019-11-26 ENCOUNTER — Other Ambulatory Visit: Payer: Self-pay

## 2019-11-26 ENCOUNTER — Ambulatory Visit (HOSPITAL_COMMUNITY)
Admission: RE | Admit: 2019-11-26 | Discharge: 2019-11-26 | Disposition: A | Discharge status: Home / Self Care | Payer: HMO | Source: Ambulatory Visit | Attending: Surgery | Admitting: Surgery

## 2019-11-26 ENCOUNTER — Encounter: Payer: Self-pay | Admitting: Surgery

## 2019-11-26 VITALS — BP 153/70 | HR 84 | Temp 98.3°F | Resp 20 | Ht 67.5 in | Wt 233.9 lb

## 2019-11-26 DIAGNOSIS — I70213 Atherosclerosis of native arteries of extremities with intermittent claudication, bilateral legs: Secondary | ICD-10-CM | POA: Insufficient documentation

## 2019-11-26 NOTE — Progress Notes (Signed)
[facility]  Patient name: Eric Choi MRN: [id number] DOB: 06-01-1940 Sex: male   REASON FOR VISIT:    Follow up  HISOTRY OF PRESENT ILLNESS:    Eric Choi is a 79 y.o. male who I met in April 2021 for evaluation of possible claudication which have been present for several years but has been progressing.  He was walking approximately one half block.  This discomfort in his legs would resolve with 2 to 3 minutes of rest.  He also has difficulty with shortness of breath which also limits his activity.  He did feel that his legs were bothering him the most.  By ultrasound he had near normal blood flow on the left and approximately 50% on the right however his left leg bothers him the most.  I started him on cilostazol to see if he got any improvement.  I also encouraged him to pursue a work-up of his lower back to see if he had any degenerative back issues that could be contributing to his symptoms.  He is back today for follow-up.  He had severe diarrhea with cilostazol so he did not take it.  Again he states that his activity is limited by his left leg.  He does not have any open wounds.  The patient has a history of coronary artery disease, status post MI in the past.  He is a diabetic with a hemoglobin A1c around 7.  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  He is a former smoker.  PAST MEDICAL HISTORY:   Past Medical History:  Diagnosis Date  . CAD (coronary artery disease)   . Colon polyp   . DIABETES MELLITUS, TYPE II 11/03/2006  . HYPERLIPIDEMIA 11/03/2006  . HYPERTENSION 11/03/2006     FAMILY HISTORY:   Family History  Problem Relation Age of Onset  . COPD Father   . Alcohol abuse Father   . Diabetes Father   . Cancer Sister        breast  . Colon cancer Neg Hx     SOCIAL HISTORY:   Social History   Tobacco Use  . Smoking status: Former Research scientist (life sciences)  . Smokeless tobacco: Never Used    Substance Use Topics  . Alcohol use: No     ALLERGIES:   No Known Allergies   CURRENT MEDICATIONS:   Current Outpatient Medications  Medication Sig Dispense Refill  . amLODipine (NORVASC) 10 MG tablet TAKE ONE TABLET BY MOUTH DAILY 90 tablet 1  . aspirin 81 MG tablet Take 81 mg by mouth daily.      . Blood Glucose Monitoring Suppl (ONETOUCH VERIO) w/Device KIT 1 each by Does not apply route 4 (four) times daily. 1 kit 0  . cilostazol (PLETAL) 100 MG tablet Take 1 tablet (100 mg total) by mouth 2 (two) times daily before a meal. 60 tablet 11  . fenofibrate (TRICOR) 145 MG tablet TAKE ONE TABLET BY MOUTH DAILY 90 tablet 3  . FLUBLOK QUADRIVALENT 0.5 ML injection     . insulin NPH Human (NOVOLIN N) 100 UNIT/ML injection INJECT 45 - 55 UNITS UNDER THE SKIN BEFORE BREAKFAST AND DINNER 30 mL 11  . insulin regular (NOVOLIN R) 100 units/mL injection INJECT 15-30 UNITS THREE TIMES DAILY BEFORE MEALS AS DIRECTED 30 mL 11  . Lancets Misc. (ACCU-CHEK MULTICLIX LANCET DEV) KIT Use to check sugar 4 times daily 400 each 5  . lisinopril (ZESTRIL) 30 MG tablet TAKE ONE  TABLET BY MOUTH DAILY 90 tablet 3  . metoprolol tartrate (LOPRESSOR) 50 MG tablet TAKE ONE TABLET BY MOUTH DAILY 90 tablet 1  . NEEDLE, DISP, 30 G (B-D DISP NEEDLE 30GX1") 30G X 1" MISC by Does not apply route as directed.      [name] VERIO test strip TEST BLOOD SUGAR FOUR TIMES A DAY AND AS NEEDED 400 strip 10  . Semaglutide, 1 MG/DOSE, (OZEMPIC, 1 MG/DOSE,) 2 MG/1.5ML SOPN Inject 1 mg into the skin once a week. 6 pen 3  . simvastatin (ZOCOR) 40 MG tablet TAKE ONE TABLET BY MOUTH AT BEDTIME 90 tablet 3   No current facility-administered medications for this visit.    REVIEW OF SYSTEMS:   [X]  denotes positive finding, [ ]  denotes negative finding Cardiac  Comments:  Chest pain or chest pressure:    Shortness of breath upon exertion:    Short of breath when lying flat:    Irregular heart rhythm:        Vascular    Pain  in calf, thigh, or hip brought on by ambulation:    Pain in feet at night that wakes you up from your sleep:     Blood clot in your veins:    Leg swelling:         Pulmonary    Oxygen at home:    Productive cough:     Wheezing:         Neurologic    Sudden weakness in arms or legs:     Sudden numbness in arms or legs:     Sudden onset of difficulty speaking or slurred speech:    Temporary loss of vision in one eye:     Problems with dizziness:         Gastrointestinal    Blood in stool:     Vomited blood:         Genitourinary    Burning when urinating:     Blood in urine:        Psychiatric    Major depression:         Hematologic    Bleeding problems:    Problems with blood clotting too easily:        Skin    Rashes or ulcers:        Constitutional    Fever or chills:      PHYSICAL EXAM:   Vitals:   11/26/19 0957  BP: (!) 153/70  Pulse: 84  Resp: 20  Temp: 98.3 F (36.8 C)  SpO2: 99%  Weight: 233 lb 14.4 oz (106.1 kg)  Height: 5' 7.5" (1.715 m)    GENERAL: The patient is a well-nourished male, in no acute distress. The vital signs are documented above. CARDIAC: There is a regular rate and rhythm.  PULMONARY: Non-labored respirations MUSCULOSKELETAL: There are no major deformities or cyanosis. NEUROLOGIC: No focal weakness or paresthesias are detected. SKIN: There are no ulcers or rashes noted. PSYCHIATRIC: The patient has a normal affect.  STUDIES:   I have reviewed the following:  ABI: Right:  0.46 , monophasic Left:  0.99, biphasic  +----------+--------+-----+---------------+----------+--------+  RIGHT   PSV cm/sRatioStenosis    Waveform Comments  +----------+--------+-----+---------------+----------+--------+  EIA Mid  82              monophasic      +----------+--------+-----+---------------+----------+--------+  EIA Distal169      50-74% stenosismonophasic       +----------+--------+-----+---------------+----------+--------+  CFA Mid  71  monophasic      +----------+--------+-----+---------------+----------+--------+  DFA    30              monophasic      +----------+--------+-----+---------------+----------+--------+  SFA Prox 58              monophasic      +----------+--------+-----+---------------+----------+--------+  SFA Mid  29              monophasic      +----------+--------+-----+---------------+----------+--------+  SFA Distal0       occluded    absent        +----------+--------+-----+---------------+----------+--------+  POP Prox 0       occluded    absent        +----------+--------+-----+---------------+----------+--------+  POP Distal18              monophasic      +----------+--------+-----+---------------+----------+--------+  ATA Distal22              monophasic      +----------+--------+-----+---------------+----------+--------+  PTA Distal20              monophasic      +----------+--------+-----+---------------+----------+--------+   A focal velocity elevation of 169 cm/s was obtained at Distal external  iliac artery with a VR of 2.1. Findings are characteristic of 50-74%  stenosis. A 2nd focal velocity elevation was visualized, measuring 0 cm/s  at Distal SFA with a VR of 0.0. Findings  are characteristic of occluded. Monophasic Doppler signal in external  iliac artery suggests proximal stenosis or occlusion.      +----------+--------+-----+---------------+--------+----------------+  LEFT   PSV cm/sRatioStenosis    WaveformComments      +----------+--------+-----+---------------+--------+----------------+  CFA Prox 185              biphasic           +----------+--------+-----+---------------+--------+----------------+  CFA Mid                     Irregular plaque  +----------+--------+-----+---------------+--------+----------------+  DFA    115              biphasic          +----------+--------+-----+---------------+--------+----------------+  SFA Prox 199              biphasiccalcified plaque  +----------+--------+-----+---------------+--------+----------------+  SFA Mid  84              biphasic          +----------+--------+-----+---------------+--------+----------------+  SFA Distal242      50-74% stenosisbiphasic          +----------+--------+-----+---------------+--------+----------------+  POP Prox 160              biphasic          +----------+--------+-----+---------------+--------+----------------+  POP Distal85              biphasic          +----------+--------+-----+---------------+--------+----------------+  ATA Distal64              biphasic          +----------+--------+-----+---------------+--------+----------------+  PTA Distal62              biphasic          +----------+--------+-----+---------------+--------+----------------+   A focal velocity elevation of 242 cm/s was obtained at Distal SFA with a  VR of 2.8. Findings are characteristic of 50-74% stenosis.    Summary:  Right: 50-74% stenosis noted in the iliac segment. Total occlusion noted  in the superficial femoral artery and/or popliteal  artery. Proximal  Doppler waveforms suggest proximal stenosis/ occlusion.   Left: 50-74% stenosis noted in the superficial femoral artery and/or  popliteal artery.  MEDICAL ISSUES:   I discussed with the patient that because his activity level is limited by his left leg which has near normal  circulation when compared to the right, I do not think his activity limitations or secondary to his peripheral vascular disease.  I suspect the injury that he suffered to his left leg is the main culprit for his walking challenges.  Therefore I would not recommend angiography at this time.  He knows to contact me should he develop a wound on his foot.  I have him scheduled for follow-up  in 1 year with repeat ABIs    Leia Alf, MD, FACS [facility] [phone] Pager [phone]

## 2019-11-29 ENCOUNTER — Other Ambulatory Visit: Payer: Self-pay

## 2019-11-29 ENCOUNTER — Telehealth: Payer: Self-pay | Admitting: Internal Medicine

## 2019-11-29 ENCOUNTER — Ambulatory Visit (INDEPENDENT_AMBULATORY_CARE_PROVIDER_SITE_OTHER): Payer: HMO | Admitting: Internal Medicine

## 2019-11-29 ENCOUNTER — Encounter: Payer: Self-pay | Admitting: Internal Medicine

## 2019-11-29 VITALS — BP 140/90 | HR 85 | Ht 67.5 in | Wt 234.0 lb

## 2019-11-29 DIAGNOSIS — E1165 Type 2 diabetes mellitus with hyperglycemia: Secondary | ICD-10-CM | POA: Diagnosis not present

## 2019-11-29 DIAGNOSIS — Z794 Long term (current) use of insulin: Secondary | ICD-10-CM

## 2019-11-29 DIAGNOSIS — E669 Obesity, unspecified: Secondary | ICD-10-CM | POA: Diagnosis not present

## 2019-11-29 DIAGNOSIS — E785 Hyperlipidemia, unspecified: Secondary | ICD-10-CM | POA: Diagnosis not present

## 2019-11-29 DIAGNOSIS — IMO0002 Reserved for concepts with insufficient information to code with codable children: Secondary | ICD-10-CM

## 2019-11-29 DIAGNOSIS — E1159 Type 2 diabetes mellitus with other circulatory complications: Secondary | ICD-10-CM

## 2019-11-29 LAB — POCT GLYCOSYLATED HEMOGLOBIN (HGB A1C): Hemoglobin A1C: 8.2 % — AB (ref 4.0–5.6)

## 2019-11-29 NOTE — Telephone Encounter (Signed)
Patient's wife just stopped by and dropped off the financial information that is needed for the patient's assistance program.

## 2019-11-29 NOTE — Progress Notes (Signed)
Patient ID: Eric Choi, male   DOB: 04-Dec-1940, 79 y.o.   MRN: [id number]  This visit occurred during the SARS-CoV-2 public health emergency.  Safety protocols were in place, including screening questions prior to the visit, additional usage of staff PPE, and extensive cleaning of exam room while observing appropriate contact time as indicated for disinfecting solutions.   HPI: Eric Choi is a 79 y.o.-year-old male, returning for f/u for DM2 dx 1990, insulin-dependent since 2005, uncontrolled, with complications (CAD, CKD, PN, DR, PVD). Last visit was 4 months ago.  He started to see Dr. Trula Slade - PVD - no surgery needed.  Reviewed HbA1c levels: Lab Results  Component Value Date   HGBA1C 7.6 (A) 07/24/2019   HGBA1C 7.7 (A) 02/22/2019   HGBA1C 8.1 (A) 10/19/2018   He is on: Insulin Before breakfast Before lunch Before dinner Bedtime  Regular 30 >> 35 units  10-15 >> 20 units 30-35 >> 35 units -  NPH 45 units   55 units    - Ozempic 0.5 mg weekly -restarted 11/2018 >> now on 1 mg weekly - Pt assistance pgm - but he was not able to get this in last 2 mo...  We tried U500 insulin but this was too expensive. He did not start Trulicity due to price - 145$.  Pt checks his sugars 3-4 times a day per review of his meter downloads: - am:  104-161, 189 >> 106-206 >> 103-203, 248 - 1-2h after b'fast:  65 >> 165 >> 182, 251 >> n/c - before lunch: 76, 104-232, 272 >> 93-115, 142 >> 85-190, 255, 280 - 2h after lunch:234 >> 136-188 >> n/c >> n/c - before dinner: 94-200 >> 72-149 >> 116-169 >> 123, 141, 229 - 1-2h after dinner: 260 >> 162 >> n/c >> 150-211 >> 139, 194 - bedtime:  n/c >> 187 >> 131 >> 72-140, 190 >> n/c - nighttime: 105-234 >> 175-229 >> n/c Lowest CBG: 50s >> 72 >> 85; he has hypoglycemia awareness in the 90s. Highest CBG: 324 >>... 200 >> 206 >> 280.  Pt's meals are: - Breakfast: cereal + milk + eggs + bacon/sausage - Lunch: egg sandwich - Dinner: meat + veggies +  starch - Snacks: 1-2: including after dinner; pears + mayonnaise, milk + crackers; cake  He drives a truck.  -+ CKD, last BUN/creatinine:  Lab Results  Component Value Date   BUN 28 (H) 09/03/2019   CREATININE 2.88 (H) 09/03/2019  On lisinopril. -+ HL; last set of lipids: Lab Results  Component Value Date   CHOL 120 09/03/2019   HDL 30.60 (L) 09/03/2019   LDLCALC 52 09/03/2019   LDLDIRECT 78.0 06/05/2019   TRIG 186.0 (H) 09/03/2019   CHOLHDL 4 09/03/2019  On Zocor 40, fenofibrate 145. - last eye exam was on 12/2018: + DR.also, + cataracts. Dr. Radene Ou. -He has numbness and tingling in his feet.  He also has HTN.  Latest TSH was normal: Lab Results  Component Value Date   TSH 4.18 06/05/2019   He lost his twin brother to cancer 05/2018.  ROS: Constitutional: no weight gain/no weight loss, no fatigue, no subjective hyperthermia, no subjective hypothermia Eyes: no blurry vision, no xerophthalmia ENT: no sore throat, no nodules palpated in neck, no dysphagia, no odynophagia, no hoarseness Cardiovascular: no CP/no SOB/no palpitations/no leg swelling Respiratory: no cough/no SOB/no wheezing Gastrointestinal: no N/no V/no D/no C/no acid reflux Musculoskeletal: no muscle aches/+ joint aches - L knee Skin: no rashes, no hair loss  Neurological: no tremors/+ numbness/+ tingling/no dizziness  I reviewed pt's medications, allergies, PMH, social hx, family hx, and changes were documented in the history of present illness. Otherwise, unchanged from my initial visit note.  Past Medical History:  Diagnosis Date  . CAD (coronary artery disease)   . Colon polyp   . DIABETES MELLITUS, TYPE II 11/03/2006  . HYPERLIPIDEMIA 11/03/2006  . HYPERTENSION 11/03/2006   Past Surgical History:  Procedure Laterality Date  . ANGIOPLASTY  20 years ago   percutaneous transluminal   . CATARACT EXTRACTION  2002, 2005   bilateral  . PTCA     Social History   Socioeconomic History  . Marital  status: Married    Spouse name: Not on file  . Number of children: Not on file  . Years of education: Not on file  . Highest education level: Not on file  Occupational History  . Not on file  Tobacco Use  . Smoking status: Former Research scientist (life sciences)  . Smokeless tobacco: Never Used  Vaping Use  . Vaping Use: Never used  Substance and Sexual Activity  . Alcohol use: No  . Drug use: No  . Sexual activity: Not on file  Other Topics Concern  . Not on file  Social History Narrative   Works 3rd shift   Regular Exercise-yes   Social Determinants of Radio broadcast assistant Strain:   . Difficulty of Paying Living Expenses:   Food Insecurity:   . Worried About Charity fundraiser in the Last Year:   . Arboriculturist in the Last Year:   Transportation Needs:   . Film/video editor (Medical):   Marland Kitchen Lack of Transportation (Non-Medical):   Physical Activity:   . Days of Exercise per Week:   . Minutes of Exercise per Session:   Stress:   . Feeling of Stress :   Social Connections:   . Frequency of Communication with Friends and Family:   . Frequency of Social Gatherings with Friends and Family:   . Attends Religious Services:   . Active Member of Clubs or Organizations:   . Attends Archivist Meetings:   Marland Kitchen Marital Status:   Intimate Partner Violence:   . Fear of Current or Ex-Partner:   . Emotionally Abused:   Marland Kitchen Physically Abused:   . Sexually Abused:    Current Outpatient Medications on File Prior to Visit  Medication Sig Dispense Refill  . amLODipine (NORVASC) 10 MG tablet TAKE ONE TABLET BY MOUTH DAILY 90 tablet 1  . aspirin 81 MG tablet Take 81 mg by mouth daily.      . Blood Glucose Monitoring Suppl (ONETOUCH VERIO) w/Device KIT 1 each by Does not apply route 4 (four) times daily. 1 kit 0  . cilostazol (PLETAL) 100 MG tablet Take 1 tablet (100 mg total) by mouth 2 (two) times daily before a meal. 60 tablet 11  . fenofibrate (TRICOR) 145 MG tablet TAKE ONE TABLET BY  MOUTH DAILY 90 tablet 3  . FLUBLOK QUADRIVALENT 0.5 ML injection     . insulin NPH Human (NOVOLIN N) 100 UNIT/ML injection INJECT 45 - 55 UNITS UNDER THE SKIN BEFORE BREAKFAST AND DINNER 30 mL 11  . insulin regular (NOVOLIN R) 100 units/mL injection INJECT 15-30 UNITS THREE TIMES DAILY BEFORE MEALS AS DIRECTED 30 mL 11  . Lancets Misc. (ACCU-CHEK MULTICLIX LANCET DEV) KIT Use to check sugar 4 times daily 400 each 5  . lisinopril (ZESTRIL) 30 MG tablet TAKE  ONE TABLET BY MOUTH DAILY 90 tablet 3  . metoprolol tartrate (LOPRESSOR) 50 MG tablet TAKE ONE TABLET BY MOUTH DAILY 90 tablet 1  . NEEDLE, DISP, 30 G (B-D DISP NEEDLE 30GX1") 30G X 1" MISC by Does not apply route as directed.      [name] VERIO test strip TEST BLOOD SUGAR FOUR TIMES A DAY AND AS NEEDED 400 strip 10  . Semaglutide, 1 MG/DOSE, (OZEMPIC, 1 MG/DOSE,) 2 MG/1.5ML SOPN Inject 1 mg into the skin once a week. 6 pen 3  . simvastatin (ZOCOR) 40 MG tablet TAKE ONE TABLET BY MOUTH AT BEDTIME 90 tablet 3   No current facility-administered medications on file prior to visit.   No Known Allergies Family History  Problem Relation Age of Onset  . COPD Father   . Alcohol abuse Father   . Diabetes Father   . Cancer Sister        breast  . Colon cancer Neg Hx     PE: BP 140/90   Pulse 85   Ht 5' 7.5" (1.715 m)   Wt 234 lb (106.1 kg)   SpO2 98%   BMI 36.11 kg/m  Body mass index is 36.11 kg/m.  Wt Readings from Last 3 Encounters:  11/29/19 234 lb (106.1 kg)  11/26/19 233 lb 14.4 oz (106.1 kg)  08/27/19 234 lb (106.1 kg)   Constitutional: overweight, in NAD Eyes: PERRLA, EOMI, no exophthalmos ENT: moist mucous membranes, no thyromegaly, no cervical lymphadenopathy Cardiovascular: RRR, No MRG Respiratory: CTA B Gastrointestinal: abdomen soft, NT, ND, BS+ Musculoskeletal: no deformities, strength intact in all 4 Skin: moist, warm, no rashes Neurological: no tremor with outstretched hands, DTR normal in all  4  ASSESSMENT: 1. DM2, uncontrolled, insulin-dep, with complications - CAD - CKD - PN - DR  - Tried to obtain U500 insulin but this was too expensive >> we had to go back to N and R insulin  2. HL  3.  Obesity class 2  PLAN:  1. Patient with longstanding, uncontrolled, type 2 diabetes, on basal/bolus insulin regimen with NPH and regular insulin due to price.  He was also on Ozempic but he could not get it since 2 mo ago  - usually through pt. assistence.  He had significant success with this in the past but they could not afford it anymore.  He now gets it through the patient assistance program. -In the past, he tried a plant-based diet, which worked very well for him, but unfortunately, he came off.  He does continue to adjust his diet to include more fruits and vegetables and less fatty foods. -At last visit, sugars were still fluctuating, depending on dietary indiscretions.  They are usually at goal in the morning but were higher whenever she was snacking at bedtime or during the night, in the 170-200 range.  I strongly advised him to try to stop snacking at night or if he did, to cover the snack with a lower dose of regular insulin.  At this visit, he tells me that he has greatly reduced his snacks after dinner. -His sugars are more fluctuating than before and higher overall.  He will occasionally has blood sugars at goal, but many of the CBGs are above target after he came off Ozempic 2 months ago.  It appears that this is due to a problem with his application.  Today we will submit another application for patient assistance for him, but I also gave him an Ozempic pen to bridge  him until he can get his supply.  I do not feel we need to increase his insulin doses for now.  He already increase them when he saw that his sugars were trending up after he had to stop Ozempic. -I advised him to: Patient Instructions   Please continue: Insulin Before breakfast Before lunch Before dinner Bedtime   Regular 35 units 20 units 35 units -  NPH 45 units   55 units   Please restart:  - Ozempic 1 mg weekly.  If you do have a snack at night, may need to cover this with 5-10 units of regular insulin (R).  Please come back for a follow-up appointment in 4 months.   - we checked his HbA1c: 8.2% (higher) - advised to check sugars at different times of the day - 3x a day, rotating check times - advised for yearly eye exams >> he is UTD - return to clinic in 4 months    2. HL -Reviewed latest lipid panel from 08/2019: LDL at goal, HDL low, triglycerides slightly high: Lab Results  Component Value Date   CHOL 120 09/03/2019   HDL 30.60 (L) 09/03/2019   LDLCALC 52 09/03/2019   LDLDIRECT 78.0 06/05/2019   TRIG 186.0 (H) 09/03/2019   CHOLHDL 4 09/03/2019  -Continue Zocor and TriCor without side effects  3.  Obesity class 2 -Before the coronavirus pandemic, he was exercising at [employer].  However, afterwards, he could not exercise due to back pain radiating down his legs and also leg weakness -He continues to work on improving his diet -We will also restart Ozempic, which should help with weight loss -Weight remains stable  Philemon Kingdom, MD PhD [facility] Endocrinology

## 2019-11-29 NOTE — Patient Instructions (Addendum)
Please continue: Insulin Before breakfast Before lunch Before dinner Bedtime  Regular 35 units 20 units 35 units -  NPH 45 units   55 units   Please restart:  - Ozempic 1 mg weekly.  If you do have a snack at night, may need to cover this with 5-10 units of regular insulin (R).  Please come back for a follow-up appointment in 4 months.

## 2019-11-29 NOTE — Addendum Note (Signed)
Addended by: Cardell Peach I on: 11/29/2019 11:30 AM   Modules accepted: Orders

## 2019-11-30 NOTE — Telephone Encounter (Signed)
Forms received, application completed and faxed with confirmation.

## 2019-12-06 ENCOUNTER — Encounter: Payer: Self-pay | Admitting: Adult Health

## 2019-12-06 ENCOUNTER — Ambulatory Visit (INDEPENDENT_AMBULATORY_CARE_PROVIDER_SITE_OTHER): Payer: HMO | Admitting: Adult Health

## 2019-12-06 ENCOUNTER — Other Ambulatory Visit: Payer: Self-pay

## 2019-12-06 ENCOUNTER — Ambulatory Visit (INDEPENDENT_AMBULATORY_CARE_PROVIDER_SITE_OTHER)
Admission: RE | Admit: 2019-12-06 | Discharge: 2019-12-06 | Disposition: A | Discharge status: Home / Self Care | Payer: HMO | Source: Ambulatory Visit | Attending: Adult Health | Admitting: Adult Health

## 2019-12-06 VITALS — BP 140/56 | Temp 97.7°F | Wt 231.0 lb

## 2019-12-06 DIAGNOSIS — G8929 Other chronic pain: Secondary | ICD-10-CM | POA: Diagnosis not present

## 2019-12-06 DIAGNOSIS — M5440 Lumbago with sciatica, unspecified side: Secondary | ICD-10-CM | POA: Diagnosis not present

## 2019-12-06 DIAGNOSIS — R0609 Other forms of dyspnea: Secondary | ICD-10-CM

## 2019-12-06 DIAGNOSIS — M545 Low back pain: Secondary | ICD-10-CM | POA: Diagnosis not present

## 2019-12-06 DIAGNOSIS — R0602 Shortness of breath: Secondary | ICD-10-CM | POA: Diagnosis not present

## 2019-12-06 DIAGNOSIS — R0600 Dyspnea, unspecified: Secondary | ICD-10-CM | POA: Diagnosis not present

## 2019-12-06 NOTE — Progress Notes (Signed)
Subjective:    Patient ID: Eric Choi, male    DOB: [birth year], 79 y.o.   MRN: [id number]  HPI 79 year old male who  has a past medical history of CAD (coronary artery disease), Colon polyp, DIABETES MELLITUS, TYPE II (11/03/2006), HYPERLIPIDEMIA (11/03/2006), and HYPERTENSION (11/03/2006).  He presents to the office today for follow-up regarding low back pain.  He continues to have chronic low back pain with intermittent episodes of left-sided sciatica.  When he was last seen in January 2021 in addition to the low back pain reported having weakness in his bilateral lower extremities that kept him from being active.  He was sent to vascular surgery for further evaluation which testing showed normal blood flow in the left with approximately 50% on the right.  He was started on cilostazol but had diarrhea with this and he stopped taking it.  He last followed up on November 26, 2019 and at that time they advise 1 year follow-up.  They did not think his activity level was limited due to peripheral vascular disease.  Have an x-ray ordered for his low back but never had it done.  Reports that he started taking 1 Tylenol every morning and this seems to significantly help with his back pain.  Additionally, he reports worsening DOE with minimal activity.  This has been an ongoing issue but feels as though his symptoms are getting worse as time goes on.  He has no dyspnea on rest and it only takes a few minutes to recover fully.  Has not noticed any lower extremity edema, had any chest pain, or palpitations.  He does have a history of angioplasty greater than 20 years ago.  Review of Systems See HPI   Past Medical History:  Diagnosis Date  . CAD (coronary artery disease)   . Colon polyp   . DIABETES MELLITUS, TYPE II 11/03/2006  . HYPERLIPIDEMIA 11/03/2006  . HYPERTENSION 11/03/2006    Social History   Socioeconomic History  . Marital status: Married    Spouse name: Not on file  . Number of  children: Not on file  . Years of education: Not on file  . Highest education level: Not on file  Occupational History  . Not on file  Tobacco Use  . Smoking status: Former Research scientist (life sciences)  . Smokeless tobacco: Never Used  Vaping Use  . Vaping Use: Never used  Substance and Sexual Activity  . Alcohol use: No  . Drug use: No  . Sexual activity: Not on file  Other Topics Concern  . Not on file  Social History Narrative   Works 3rd shift   Regular Exercise-yes   Social Determinants of Radio broadcast assistant Strain:   . Difficulty of Paying Living Expenses:   Food Insecurity:   . Worried About Charity fundraiser in the Last Year:   . Arboriculturist in the Last Year:   Transportation Needs:   . Film/video editor (Medical):   Marland Kitchen Lack of Transportation (Non-Medical):   Physical Activity:   . Days of Exercise per Week:   . Minutes of Exercise per Session:   Stress:   . Feeling of Stress :   Social Connections:   . Frequency of Communication with Friends and Family:   . Frequency of Social Gatherings with Friends and Family:   . Attends Religious Services:   . Active Member of Clubs or Organizations:   . Attends Archivist Meetings:   .  Marital Status:   Intimate Partner Violence:   . Fear of Current or Ex-Partner:   . Emotionally Abused:   Marland Kitchen Physically Abused:   . Sexually Abused:     Past Surgical History:  Procedure Laterality Date  . ANGIOPLASTY  20 years ago   percutaneous transluminal   . CATARACT EXTRACTION  2002, 2005   bilateral  . PTCA      Family History  Problem Relation Age of Onset  . COPD Father   . Alcohol abuse Father   . Diabetes Father   . Cancer Sister        breast  . Colon cancer Neg Hx     No Known Allergies  Current Outpatient Medications on File Prior to Visit  Medication Sig Dispense Refill  . amLODipine (NORVASC) 10 MG tablet TAKE ONE TABLET BY MOUTH DAILY 90 tablet 1  . aspirin 81 MG tablet Take 81 mg by mouth  daily.      . Blood Glucose Monitoring Suppl (ONETOUCH VERIO) w/Device KIT 1 each by Does not apply route 4 (four) times daily. 1 kit 0  . cilostazol (PLETAL) 100 MG tablet Take 1 tablet (100 mg total) by mouth 2 (two) times daily before a meal. 60 tablet 11  . fenofibrate (TRICOR) 145 MG tablet TAKE ONE TABLET BY MOUTH DAILY 90 tablet 3  . FLUBLOK QUADRIVALENT 0.5 ML injection     . insulin NPH Human (NOVOLIN N) 100 UNIT/ML injection INJECT 45 - 55 UNITS UNDER THE SKIN BEFORE BREAKFAST AND DINNER 30 mL 11  . insulin regular (NOVOLIN R) 100 units/mL injection INJECT 15-30 UNITS THREE TIMES DAILY BEFORE MEALS AS DIRECTED 30 mL 11  . Lancets Misc. (ACCU-CHEK MULTICLIX LANCET DEV) KIT Use to check sugar 4 times daily 400 each 5  . lisinopril (ZESTRIL) 30 MG tablet TAKE ONE TABLET BY MOUTH DAILY 90 tablet 3  . metoprolol tartrate (LOPRESSOR) 50 MG tablet TAKE ONE TABLET BY MOUTH DAILY 90 tablet 1  . NEEDLE, DISP, 30 G (B-D DISP NEEDLE 30GX1") 30G X 1" MISC by Does not apply route as directed.      [name] VERIO test strip TEST BLOOD SUGAR FOUR TIMES A DAY AND AS NEEDED 400 strip 10  . Semaglutide, 1 MG/DOSE, (OZEMPIC, 1 MG/DOSE,) 2 MG/1.5ML SOPN Inject 1 mg into the skin once a week. 6 pen 3  . simvastatin (ZOCOR) 40 MG tablet TAKE ONE TABLET BY MOUTH AT BEDTIME 90 tablet 3   No current facility-administered medications on file prior to visit.    BP (!) 140/56   Temp 97.7 F (36.5 C)   Wt 231 lb (104.8 kg)   BMI 35.65 kg/m       Objective:   Physical Exam Vitals and nursing note reviewed.  Constitutional:      Appearance: Normal appearance. He is obese.  Cardiovascular:     Rate and Rhythm: Normal rate and regular rhythm.     Pulses: Normal pulses.     Heart sounds: Normal heart sounds.  Pulmonary:     Effort: Pulmonary effort is normal.     Breath sounds: Normal breath sounds.  Musculoskeletal:        General: Normal range of motion.  Skin:    General: Skin is warm.      Capillary Refill: Capillary refill takes less than 2 seconds.  Neurological:     General: No focal deficit present.     Mental Status: He is alert and oriented to  person, place, and time.  Psychiatric:        Mood and Affect: Mood normal.        Behavior: Behavior normal.        Thought Content: Thought content normal.        Judgment: Judgment normal.       Assessment & Plan:  1. Chronic bilateral low back pain with sciatica, sciatica laterality unspecified -Start with x-ray, likely need further imaging such as an MRI and referral to neurosurgery. - DG Lumbar Spine Complete; Future  2. DOE (dyspnea on exertion) -Possible CAD versus deconditioning.  No lower extremity edema do not think this is from CHF.  Possible pulmonary disease.  Consider referral to cardiology or pulmonary - DG Chest 2 View; Future   [name], NP

## 2019-12-07 ENCOUNTER — Other Ambulatory Visit: Payer: Self-pay | Admitting: Family Medicine

## 2019-12-07 DIAGNOSIS — M47816 Spondylosis without myelopathy or radiculopathy, lumbar region: Secondary | ICD-10-CM

## 2019-12-07 DIAGNOSIS — M5136 Other intervertebral disc degeneration, lumbar region: Secondary | ICD-10-CM

## 2019-12-07 DIAGNOSIS — R0602 Shortness of breath: Secondary | ICD-10-CM

## 2019-12-13 NOTE — Telephone Encounter (Signed)
Patient's wife came by stating [employer] was supposed to have faxed over some forms that were to go with the application that needed to be signed and I told her we have no gotten them yet, but she asked that when we do get them if we could contact her and let her know. Ph# [phone]

## 2019-12-17 MED FILL — NovoLIN N 100 UNIT/ML SUSP: 100 | 27 days supply | Qty: 30 | Fill #2

## 2019-12-17 MED FILL — NovoLIN R 100 UNIT/ML SOLN: 100 | 33 days supply | Qty: 30 | Fill #1

## 2019-12-17 NOTE — Telephone Encounter (Signed)
In an attempt to expedite request, renewal application has been re-faxed. Confirmation received.

## 2019-12-18 ENCOUNTER — Ambulatory Visit: Payer: HMO

## 2019-12-24 ENCOUNTER — Other Ambulatory Visit: Payer: Self-pay | Admitting: Internal Medicine

## 2019-12-26 ENCOUNTER — Other Ambulatory Visit: Payer: Self-pay | Admitting: Adult Health

## 2019-12-26 DIAGNOSIS — E785 Hyperlipidemia, unspecified: Secondary | ICD-10-CM

## 2019-12-26 NOTE — Telephone Encounter (Signed)
SENT TO THE PHARMACY BY E-SCRIBE. 

## 2019-12-27 ENCOUNTER — Other Ambulatory Visit: Payer: Self-pay

## 2019-12-27 ENCOUNTER — Encounter (INDEPENDENT_AMBULATORY_CARE_PROVIDER_SITE_OTHER): Payer: Self-pay | Admitting: Ophthalmology

## 2019-12-27 ENCOUNTER — Ambulatory Visit (INDEPENDENT_AMBULATORY_CARE_PROVIDER_SITE_OTHER): Payer: HMO | Admitting: Ophthalmology

## 2019-12-27 DIAGNOSIS — H3563 Retinal hemorrhage, bilateral: Secondary | ICD-10-CM | POA: Diagnosis not present

## 2019-12-27 DIAGNOSIS — H35043 Retinal micro-aneurysms, unspecified, bilateral: Secondary | ICD-10-CM | POA: Diagnosis not present

## 2019-12-27 DIAGNOSIS — E113393 Type 2 diabetes mellitus with moderate nonproliferative diabetic retinopathy without macular edema, bilateral: Secondary | ICD-10-CM

## 2019-12-27 DIAGNOSIS — H43822 Vitreomacular adhesion, left eye: Secondary | ICD-10-CM | POA: Diagnosis not present

## 2019-12-27 DIAGNOSIS — H43821 Vitreomacular adhesion, right eye: Secondary | ICD-10-CM | POA: Diagnosis not present

## 2019-12-27 NOTE — Assessment & Plan Note (Signed)
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

## 2019-12-27 NOTE — Assessment & Plan Note (Signed)
The nature of mild nonproliferative diabetic retinopathy was discussed with the patient. Emphasis was placed on tight glucose, blood pressure, and serum lipid control. Avoidance of smoking was emphasized. Maintenance of normal body weight was emphasized. Appropriate follow up dilated exam is 1 year. 

## 2019-12-27 NOTE — Progress Notes (Signed)
12/27/2019     CHIEF COMPLAINT Patient presents for Diabetic Eye Exam   HISTORY OF PRESENT ILLNESS: Eric Choi is a 79 y.o. male who presents to the clinic today for:   HPI    Diabetic Eye Exam    Vision is stable.  Diabetes characteristics include Type 2.  Blood sugar level is controlled.  Last Blood Glucose 203.  Last A1C 8.1.  I, the attending physician,  performed the HPI with the patient and updated documentation appropriately.          Comments    1 Year Diabetic Exam OU. OCT  Pt states vision is stable. Denies any complaints.       Last edited by Tilda Franco on 12/27/2019  8:06 AM. (History)      Referring physician: [name], NP [place],  [place] [address]  HISTORICAL INFORMATION:   Selected notes from the MEDICAL RECORD NUMBER    Lab Results  Component Value Date   HGBA1C 8.2 (A) 11/29/2019     CURRENT MEDICATIONS: No current outpatient medications on file. (Ophthalmic Drugs)   No current facility-administered medications for this visit. (Ophthalmic Drugs)   Current Outpatient Medications (Other)  Medication Sig  . amLODipine (NORVASC) 10 MG tablet TAKE ONE TABLET BY MOUTH DAILY  . aspirin 81 MG tablet Take 81 mg by mouth daily.    . Blood Glucose Monitoring Suppl (ONETOUCH VERIO) w/Device KIT 1 each by Does not apply route 4 (four) times daily.  . cilostazol (PLETAL) 100 MG tablet Take 1 tablet (100 mg total) by mouth 2 (two) times daily before a meal.  . fenofibrate (TRICOR) 145 MG tablet TAKE ONE TABLET BY MOUTH DAILY  . FLUBLOK QUADRIVALENT 0.5 ML injection   . insulin NPH Human (NOVOLIN N) 100 UNIT/ML injection INJECT 45 - 55 UNITS UNDER THE SKIN BEFORE BREAKFAST AND DINNER  . insulin regular (NOVOLIN R) 100 units/mL injection INJECT 15-30 UNITS THREE TIMES DAILY BEFORE MEALS AS DIRECTED  . Lancets Misc. (ACCU-CHEK MULTICLIX LANCET DEV) KIT Use to check sugar 4 times daily  . lisinopril (ZESTRIL) 30 MG tablet  TAKE ONE TABLET BY MOUTH DAILY  . metoprolol tartrate (LOPRESSOR) 50 MG tablet TAKE ONE TABLET BY MOUTH DAILY  . NEEDLE, DISP, 30 G (B-D DISP NEEDLE 30GX1") 30G X 1" MISC by Does not apply route as directed.    [name] VERIO test strip USE TO TEST BLOOD SUGAR 4 TIMES DAILY AS NEEDED  . Semaglutide, 1 MG/DOSE, (OZEMPIC, 1 MG/DOSE,) 2 MG/1.5ML SOPN Inject 1 mg into the skin once a week.  . simvastatin (ZOCOR) 40 MG tablet TAKE ONE TABLET BY MOUTH AT BEDTIME   No current facility-administered medications for this visit. (Other)      REVIEW OF SYSTEMS:    ALLERGIES No Known Allergies  PAST MEDICAL HISTORY Past Medical History:  Diagnosis Date  . CAD (coronary artery disease)   . Colon polyp   . DIABETES MELLITUS, TYPE II 11/03/2006  . HYPERLIPIDEMIA 11/03/2006  . HYPERTENSION 11/03/2006   Past Surgical History:  Procedure Laterality Date  . ANGIOPLASTY  20 years ago   percutaneous transluminal   . CATARACT EXTRACTION  2002, 2005   bilateral  . PTCA      FAMILY HISTORY Family History  Problem Relation Age of Onset  . COPD Father   . Alcohol abuse Father   . Diabetes Father   . Cancer Sister  breast  . Colon cancer Neg Hx     SOCIAL HISTORY Social History   Tobacco Use  . Smoking status: Former Research scientist (life sciences)  . Smokeless tobacco: Never Used  Vaping Use  . Vaping Use: Never used  Substance Use Topics  . Alcohol use: No  . Drug use: No         OPHTHALMIC EXAM:  Base Eye Exam    Visual Acuity (Snellen - Linear)      Right Left   Dist cc 20/25 -1 20/40 -1   Dist ph cc  NI   Correction: Glasses       Tonometry (Tonopen, 8:11 AM)      Right Left   Pressure 13 14       Pupils      Dark Light Shape React APD   Right 2 1.5 Round Brisk None   Left 2 1.5 Round Brisk None       Visual Fields (Counting fingers)      Left Right    Full Full       Neuro/Psych    Oriented x3: Yes   Mood/Affect: Normal       Dilation    Both eyes: 1.0%  Mydriacyl, 2.5% Phenylephrine @ 8:11 AM        Slit Lamp and Fundus Exam    External Exam      Right Left   External Normal Normal       Slit Lamp Exam      Right Left   Lids/Lashes Normal Normal   Conjunctiva/Sclera White and quiet White and quiet   Cornea Clear Clear   Anterior Chamber Deep and quiet Deep and quiet   Iris Round and reactive Round and reactive   Lens Centered posterior chamber intraocular lens Centered posterior chamber intraocular lens   Anterior Vitreous Normal Normal       Fundus Exam      Right Left   Posterior Vitreous Posterior vitreous detachment Posterior vitreous detachment   Disc Normal Normal   C/D Ratio 0.55 0.55   Macula Microaneurysms Microaneurysms   Vessels NPDR- Mild NPDR- Mild   Periphery no neovascularization, normal no neovascularization, normal          IMAGING AND PROCEDURES  Imaging and Procedures for 12/27/19  OCT, Retina - OU - Both Eyes       Right Eye Quality was good. Scan locations included subfoveal. Central Foveal Thickness: 237. Findings include normal foveal contour.   Left Eye Central Foveal Thickness: 258. Findings include normal foveal contour, vitreomacular adhesion .   Notes Nondistorting vitreomacular adhesion left eye.                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 Emphasis was placed on tight glucose, blood pressure, and serum lipid control. Avoidance of smoking was emphasized. Maintenance of normal body weight was emphasized. Appropriate follow up dilated exam is 1 year.    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 Retinal hemorrhage, bilateral  H35.63   3. 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 Vitreomacular adhesion of right eye  H43.821     1.  Continue with excellent blood sugar control to minimize the risk of diabetic retinopathy progression  2.  3.  Ophthalmic Meds Ordered this visit:  No orders of the defined types were placed in this encounter.      Return in about 1 year (around 12/26/2020) for COLOR FP, OCT, DILATE OU.  There are no Patient Instructions on file for this visit.   Explained the diagnoses, plan, and follow up with the patient and they expressed understanding.  Patient expressed understanding of the importance of proper follow up care.   Clent Demark Cortney Mckinney M.D. Diseases & Surgery of the Retina and Vitreous [facility] [place] 12/27/19     Abbreviations: M myopia (nearsighted); A astigmatism; H hyperopia (farsighted); P presbyopia; Mrx spectacle prescription;  CTL contact lenses; OD right eye; OS left eye; OU both eyes  XT exotropia; ET esotropia; PEK punctate epithelial keratitis; PEE punctate epithelial erosions; DES dry eye syndrome; MGD meibomian gland dysfunction; ATs artificial tears; PFAT's preservative free artificial tears; [place] nuclear sclerotic cataract; PSC posterior subcapsular cataract; ERM epi-retinal membrane; PVD posterior vitreous detachment; RD retinal detachment; DM diabetes mellitus; DR diabetic retinopathy; NPDR non-proliferative diabetic retinopathy; PDR proliferative diabetic retinopathy; CSME clinically significant macular edema; DME diabetic macular edema; dbh dot blot hemorrhages; CWS cotton wool spot; POAG primary open angle glaucoma; C/D cup-to-disc ratio; HVF humphrey visual field; GVF goldmann visual field; OCT optical coherence tomography; IOP intraocular pressure; BRVO Branch retinal vein occlusion; CRVO  central retinal vein occlusion; CRAO central retinal artery occlusion; BRAO branch retinal artery occlusion; RT retinal tear; SB scleral buckle; PPV pars plana vitrectomy; VH Vitreous hemorrhage; PRP panretinal laser photocoagulation; IVK intravitreal kenalog; VMT vitreomacular traction; MH Macular hole;  NVD neovascularization of the disc; NVE neovascularization elsewhere; AREDS age related eye disease study; ARMD age related macular degeneration; POAG primary open angle glaucoma; EBMD epithelial/anterior basement membrane dystrophy; ACIOL anterior chamber intraocular lens; IOL intraocular lens; PCIOL posterior chamber intraocular lens; Phaco/IOL phacoemulsification with intraocular lens placement; [place] photorefractive keratectomy; LASIK laser assisted in situ keratomileusis; HTN hypertension; DM diabetes mellitus; COPD chronic obstructive pulmonary disease

## 2019-12-31 ENCOUNTER — Other Ambulatory Visit: Payer: Self-pay

## 2019-12-31 NOTE — Patient Outreach (Signed)
  [facility]) Care Management Chronic Special Needs Program    12/31/2019  Name: Eric Choi, DOB: May 12, 1941  MRN: [id number]   Mr. Eric Choi is enrolled in a chronic special needs plan for Diabetes. RNCM called to follow up, review assessment and update individualized care plan. No answer. HIPAA compliant message left.   Plan: Chronic care management coordinator will attempt outreach within 1-2 weeks.  Thea Silversmith, RN, MSN, [place] [phone]

## 2020-01-04 ENCOUNTER — Other Ambulatory Visit: Payer: Self-pay

## 2020-01-04 NOTE — Patient Outreach (Signed)
  [facility]) Care Management Chronic Special Needs Program    01/04/2020  Name: BRAHM BARBEAU, DOB: November 10, 1940  MRN: [id number]   Mr. Jani Ploeger is enrolled in a chronic special needs plan for Diabetes. RNCM returned call placed. No answer. HIPPA compliant message left. RNCM called to follow up on assessment, update care plan.  Plan: RNCM will outreach within the next 1-2 weeks.  Thea Silversmith, RN, MSN, [place] [phone]

## 2020-01-07 ENCOUNTER — Other Ambulatory Visit: Payer: Self-pay

## 2020-01-07 NOTE — Patient Outreach (Addendum)
[place] [facility]) Care Management Chronic Special Needs Program  01/07/2020  Name: Eric Choi DOB: 16-Jan-1941  MRN: [id number]  Mr. Eric Choi is enrolled in a chronic special needs plan for Diabetes. RNCM called to follow up, assess, review and update care plan.  RNCM called to speak with client. Mr. Eric Choi confirmed two patient identifiers and request  that RNCM speak with Eric Choi for assessment.   Subjective: Eric Choi reports client is doing well. He has all his medications and is still driving and attends provider visits as scheduled. She reports client's blood sugar today was higher than normal this morning at 180, but A1C was 7.0 on 12/06/19 improved from 8.2 on 11/28/20.. She states client has improved his diet and is more active/exercising now. Last endocrinologist visit on 11/29/19. She denies any questions or concerns. No care management needs noted at this time.  Goals Addressed            This Visit's Progress   . COMPLETED: Client verbalizes knowledge of Heart Attack self management skills within the next 6 months.       Attends provider visits as scheduled. Takes medications as prescribed. Denies any issues.     . COMPLETED: Client will report no worsening of symptoms related to heart disease within the next 6-9 months        Denies any signs/symptoms related to heart disease. Emmi education provided: "lowering your risk of heart disease". Please review and call if you have any questions.    . Client will verbalize knowledge of self management of Hypertension as evidences by BP reading of 140/90 or less; or as defined by provider   On track    Per chart: blood pressure 140/56 on 12/06/19 Continue to takes medications as prescribed. Continue to exercise per provider recommendations. Take blood pressure routinely and record. Take list of readings to provider appointments. If you do not have a blood pressure monitor, check your over the counter benefit  catalog or contact your health care concierge. Plan to eat low salt and heart healthy meals with fruits, vegetables, whole grains, lean protein and limit fat and sugars. Reviewed blood pressure medications. Emmi education provided, " Dash diet". Please review and call if you have any questions.    Marland Kitchen HEMOGLOBIN A1C < 7       Per client/caregiver: A1C 7.0 on 12/06/19 Great Job!  Continue Diabetes self management actions:  Glucose monitoring per provider recommendations.  Eat Healthy-continue to eat low carbohydrate and low salt meals, watch portion sizes and avoid sugar sweetened drinks.  Visit provider every 3-6 months or as directed.  Hbg A1C level every 3-6 months or as directed.  Continue to exercise per provider recommendations.     . COMPLETED: Obtain annual  Lipid Profile, LDL-C       Completed 09/03/2019    . COMPLETED: Obtain Annual Eye (retinal)  Exam        Reports done 12/27/19    . COMPLETED: Obtain Annual Foot Exam       Reports done 11/29/19    . COMPLETED: Obtain Hemoglobin A1C at least 2 times per year       Per Eric Choi: Done 12/06/19 A1C 7.0 improved from 8.2 on 11/29/19.    Marland Kitchen COMPLETED: Visit Primary Care Provider or Endocrinologist at least 2 times per year        Primary care office visit: last seen 12/06/19 and 06/05/2019 Endocrinologist visit last seen 11/29/19 and 07/24/19  Eric Choi reports client has been using his over the counter benefit care. She reports they have both been fully vaccinated for Covid-19.Encouraged to call RNCM as needed. Client no patient stated goals.  Plan: send updated care plan to client, send updated care plan to primary care provider. Next outreach scheduled per tier level within the next 9-12 months.    Thea Silversmith, RN, MSN, [place] [phone]

## 2020-01-14 ENCOUNTER — Other Ambulatory Visit: Payer: Self-pay

## 2020-01-14 ENCOUNTER — Ambulatory Visit
Admission: RE | Admit: 2020-01-14 | Discharge: 2020-01-14 | Disposition: A | Discharge status: Home / Self Care | Payer: HMO | Source: Ambulatory Visit | Attending: Adult Health | Admitting: Adult Health

## 2020-01-14 DIAGNOSIS — M47816 Spondylosis without myelopathy or radiculopathy, lumbar region: Secondary | ICD-10-CM

## 2020-01-14 DIAGNOSIS — M5136 Other intervertebral disc degeneration, lumbar region: Secondary | ICD-10-CM

## 2020-01-14 DIAGNOSIS — M48061 Spinal stenosis, lumbar region without neurogenic claudication: Secondary | ICD-10-CM | POA: Diagnosis not present

## 2020-01-15 ENCOUNTER — Other Ambulatory Visit: Payer: Self-pay | Admitting: Adult Health

## 2020-01-15 DIAGNOSIS — M47816 Spondylosis without myelopathy or radiculopathy, lumbar region: Secondary | ICD-10-CM

## 2020-01-15 DIAGNOSIS — M5136 Other intervertebral disc degeneration, lumbar region: Secondary | ICD-10-CM

## 2020-01-15 DIAGNOSIS — R9389 Abnormal findings on diagnostic imaging of other specified body structures: Secondary | ICD-10-CM

## 2020-01-16 MED FILL — NovoLIN R 100 UNIT/ML SOLN: 100 | 33 days supply | Qty: 30 | Fill #2

## 2020-01-16 MED FILL — NovoLIN N 100 UNIT/ML SUSP: 100 | 27 days supply | Qty: 30 | Fill #3

## 2020-01-24 NOTE — Progress Notes (Signed)
Chief Complaint  Patient presents with  . New Patient (Initial Visit)    Dyspnea on exertion   History of Present Illness: 79 yo male with history of CAD, DM, HTN, former tobacco abuse, PAD and hyperlipidemia who is here today as a new consult, referred by [name], NP, for the evaluation of dyspnea on exertion. He is followed in the [facility] by Dr. Trula Slade for PAD with no prior lower extremity procedures. 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 He had followed in the past with Dr. Olevia Perches. He describes dyspnea on exertion for several years. No chest pain. No LE edema. No dizziness, near syncope or palpitations. Former smoker but stopped in 1993.   Primary Care Physician: [name], NP  Past Medical History:  Diagnosis Date  . CAD (coronary artery disease)   . Colon polyp   . DIABETES MELLITUS, TYPE II 11/03/2006  . HYPERLIPIDEMIA 11/03/2006  . HYPERTENSION 11/03/2006    Past Surgical History:  Procedure Laterality Date  . ANGIOPLASTY  20 years ago   percutaneous transluminal   . CATARACT EXTRACTION  2002, 2005   bilateral    Current Outpatient Medications  Medication Sig Dispense Refill  . amLODipine (NORVASC) 10 MG tablet TAKE ONE TABLET BY MOUTH DAILY 90 tablet 1  . aspirin 81 MG tablet Take 81 mg by mouth daily.      . Blood Glucose Monitoring Suppl (ONETOUCH VERIO) w/Device KIT 1 each by Does not apply route 4 (four) times daily. 1 kit 0  . fenofibrate (TRICOR) 145 MG tablet TAKE ONE TABLET BY MOUTH DAILY 90 tablet 3  . FLUBLOK QUADRIVALENT 0.5 ML injection     . insulin NPH Human (NOVOLIN N) 100 UNIT/ML injection INJECT 45 - 55 UNITS UNDER THE SKIN BEFORE BREAKFAST AND DINNER 30 mL 11  . insulin regular (NOVOLIN R) 100 units/mL injection INJECT 15-30 UNITS THREE TIMES DAILY BEFORE MEALS AS DIRECTED 30 mL 11  . Lancets Misc. (ACCU-CHEK MULTICLIX LANCET DEV) KIT Use to check sugar 4 times daily 400 each 5  .  lisinopril (ZESTRIL) 30 MG tablet TAKE ONE TABLET BY MOUTH DAILY 90 tablet 1  . metoprolol tartrate (LOPRESSOR) 50 MG tablet TAKE ONE TABLET BY MOUTH DAILY 90 tablet 1  . NEEDLE, DISP, 30 G (B-D DISP NEEDLE 30GX1") 30G X 1" MISC by Does not apply route as directed.      [name] VERIO test strip USE TO TEST BLOOD SUGAR 4 TIMES DAILY AS NEEDED 400 strip 10  . simvastatin (ZOCOR) 40 MG tablet TAKE ONE TABLET BY MOUTH AT BEDTIME 90 tablet 1   No current facility-administered medications for this visit.    No Known Allergies  Social History   Socioeconomic History  . Marital status: Married    Spouse name: Not on file  . Number of children: 2  . Years of education: Not on file  . Highest education level: Not on file  Occupational History  . Occupation: Retired-Truck Geophysicist/field seismologist  Tobacco Use  . Smoking status: Former Smoker    Types: Cigarettes    Quit date: 05/18/1991    Years since quitting: 28.7  . Smokeless tobacco: Never Used  Vaping Use  . Vaping Use: Never used  Substance and Sexual Activity  . Alcohol use: No  . Drug use: No  . Sexual activity: Not on file  Other Topics Concern  . Not on file  Social History Narrative  Works 3rd shift   Regular Exercise-yes   Social Determinants of Radio broadcast assistant Strain:   . Difficulty of Paying Living Expenses: Not on file  Food Insecurity: No Food Insecurity  . Worried About Charity fundraiser in the Last Year: Never true  . Ran Out of Food in the Last Year: Never true  Transportation Needs: No Transportation Needs  . Lack of Transportation (Medical): No  . Lack of Transportation (Non-Medical): No  Physical Activity:   . Days of Exercise per Week: Not on file  . Minutes of Exercise per Session: Not on file  Stress:   . Feeling of Stress : Not on file  Social Connections:   . Frequency of Communication with Friends and Family: Not on file  . Frequency of Social Gatherings with Friends and Family: Not on file  .  Attends Religious Services: Not on file  . Active Member of Clubs or Organizations: Not on file  . Attends Archivist Meetings: Not on file  . Marital Status: Not on file  Intimate Partner Violence:   . Fear of Current or Ex-Partner: Not on file  . Emotionally Abused: Not on file  . Physically Abused: Not on file  . Sexually Abused: Not on file    Family History  Problem Relation Age of Onset  . COPD Father   . Alcohol abuse Father   . Diabetes Father   . Cancer Sister        breast  . Stroke Mother   . Colon cancer Neg Hx     Review of Systems:  As stated in the HPI and otherwise negative.   BP 134/60   Pulse 81   Ht 5' 7.5" (1.715 m)   Wt 231 lb 9.6 oz (105.1 kg)   SpO2 96%   BMI 35.74 kg/m   Physical Examination: General: Well developed, well nourished, NAD  HEENT: OP clear, mucus membranes moist  SKIN: warm, dry. No rashes. Neuro: No focal deficits  Musculoskeletal: Muscle strength 5/5 all ext  Psychiatric: Mood and affect normal  Neck: No JVD, no carotid bruits, no thyromegaly, no lymphadenopathy.  Lungs:Clear bilaterally, no wheezes, rhonci, crackles Cardiovascular: Regular rate and rhythm. No murmurs, gallops or rubs. Abdomen:Soft. Bowel sounds present. Non-tender.  Extremities: No lower extremity edema. Pulses are 2 + in the bilateral DP/PT.  EKG:  EKG is ordered today. The ekg ordered today demonstrates Sinus  Recent Labs: 06/05/2019: Hemoglobin 14.7; Platelets 319.0; TSH 4.18 09/03/2019: ALT 17; BUN 28; Creatinine, Ser 2.88; Potassium 4.7; Sodium 138   Lipid Panel    Component Value Date/Time   CHOL 120 09/03/2019 0735   TRIG 186.0 (H) 09/03/2019 0735   HDL 30.60 (L) 09/03/2019 0735   CHOLHDL 4 09/03/2019 0735   VLDL 37.2 09/03/2019 0735   LDLCALC 52 09/03/2019 0735   LDLDIRECT 78.0 06/05/2019 0925     Wt Readings from Last 3 Encounters:  01/25/20 231 lb 9.6 oz (105.1 kg)  12/06/19 231 lb (104.8 kg)  11/29/19 234 lb (106.1 kg)      Other studies Reviewed: Additional studies/ records that were reviewed today include: office notes Review of the above records demonstrates:    Assessment and Plan:   1. Dyspnea on exertion with history of CAD: Remote atherectomy of the LAD in 1994 with no stenting at that time. No cardiac cath since then. Now with dyspnea on exertion and fatigue. Will arrange an echo to exclude structural heart disease and a  Lexiscan nuclear stress test to exclude ischemia.   Current medicines are reviewed at length with the patient today.  The patient does not have concerns regarding medicines.  The following changes have been made:  no change  Labs/ tests ordered today include:   Orders Placed This Encounter  Procedures  . MYOCARDIAL PERFUSION IMAGING  . EKG 12-Lead  . ECHOCARDIOGRAM COMPLETE     Disposition:   FU with me in 4-6 weeks.    Signed, Lauree Chandler, MD 01/25/2020 10:15 AM    [address], [place],   [address] Phone: [phone]; Fax: [phone]

## 2020-01-25 ENCOUNTER — Encounter: Payer: Self-pay | Admitting: Cardiovascular Disease

## 2020-01-25 ENCOUNTER — Ambulatory Visit: Payer: HMO | Admitting: Cardiovascular Disease

## 2020-01-25 ENCOUNTER — Other Ambulatory Visit: Payer: Self-pay

## 2020-01-25 VITALS — BP 134/60 | HR 81 | Ht 67.5 in | Wt 231.6 lb

## 2020-01-25 DIAGNOSIS — R0609 Other forms of dyspnea: Secondary | ICD-10-CM

## 2020-01-25 DIAGNOSIS — I2510 Atherosclerotic heart disease of native coronary artery without angina pectoris: Secondary | ICD-10-CM

## 2020-01-25 DIAGNOSIS — R0600 Dyspnea, unspecified: Secondary | ICD-10-CM | POA: Diagnosis not present

## 2020-01-25 NOTE — Patient Instructions (Signed)
Medication Instructions:  Your provider recommends that you continue on your current medications as directed. Please refer to the Current Medication list given to you today.   *If you need a refill on your cardiac medications before your next appointment, please call your pharmacy*  Testing/Procedures: Your physician has requested that you have an echocardiogram. Echocardiography is a painless test that uses sound waves to create images of your heart. It provides your doctor with information about the size and shape of your heart and how well your heart's chambers and valves are working. This procedure takes approximately one hour. There are no restrictions for this procedure.  Your provider has requested that you have a lexiscan myoview. For further information please visit [email or web address]. Please follow instruction sheet, as given.  Follow-Up: Your provider recommends that you schedule a follow-up appointment in: 4-6 weeks with Dr. Angelena Form.

## 2020-01-28 ENCOUNTER — Ambulatory Visit: Payer: HMO

## 2020-01-28 NOTE — Chronic Care Management (AMB) (Signed)
Chronic Care Management Pharmacy  Name: Eric Choi  MRN: [id number] DOB: Oct 10, 1940  Initial Questions: 1. Have you seen any other providers since your last visit? Yes  2. Any changes in your medicines or health? No   Chief Complaint/ HPI  Eric Choi,  79 y.o. , male presents for their Follow-Up CCM visit with the clinical pharmacist via telephone due to COVID-19 Pandemic.  PCP : [name], NP  Their chronic conditions include: DM, HTN, CAD, HLD, low back pain  Office Visits: 12/06/2019 Patient presented to [name], NP with back pain and dyspnea on exertion. Spine and chest X-rays ordered. Patient may need referral to cardiology or pulmonology.    06/05/2019- Patient presented to [name], NP for yearly preventative exam. Patient instructed to lose weight. Follow up in 6 months or sooner if needed. Patient to obtain CBC, CMP, lipid panel, TSH. Patient to continue with fenofibrate. Considering Crestor or Lipitor. No changes in BP regimen; better controlled at home. Patient to continue with endocrinology. Referred to neurosurgery due to chronic bilateral low back pain with bilateral sciatica and referred to vascular surgery for claudication.   Consult Visit: 01/25/20 - Cardiology - Patient presented for initial visit with Dr. Lauree Chandler. Patient reports dyspnea on exertion and fatigue. Echo will be arranged. No medication changes. Follow up in 4-6 weeks.  12/27/19 - Ophthalmology - Patient presented for annual DM eye exam with Dr. Deloria Lair. Continue with blood sugar control and follow up in 1 year.  11/29/19- Endocrinology- Patient presented for office visit with Dr. Layla Maw, MD. No medication changes. Patient to continue insulin regular, 35 units before breakfast, 15 units before lunch, and 35 units before dinner; insulin NPH 45 units before breakfast, 55 units at bedtime. Pt unable to get Ozempic through patient assistance program for the last 2  months. Follow up in 4 months. A1C increased to 8.2 from 7.6%.   11/26/19 - Jen Mow, MD, Vascular surgery - Patient presented for  possible claudication follow up. Patient had severe diarrhea with cilostazol and did not take. BP was elevated in office. MD does not think this is due to peripheral vascular disease. Follow up in 1 year.  08/27/19 - Jen Mow, MD, Vascular surgery - Patient presented for initial visit for possible claudication. Patient reports difficulty walking and SOB. Prescribed cilostazol and follow up in 3 months.    Medications: Outpatient Encounter Medications as of 01/28/2020  Medication Sig  . amLODipine (NORVASC) 10 MG tablet TAKE ONE TABLET BY MOUTH DAILY  . aspirin 81 MG tablet Take 81 mg by mouth daily.    . Blood Glucose Monitoring Suppl (ONETOUCH VERIO) w/Device KIT 1 each by Does not apply route 4 (four) times daily.  . fenofibrate (TRICOR) 145 MG tablet TAKE ONE TABLET BY MOUTH DAILY  . FLUBLOK QUADRIVALENT 0.5 ML injection   . insulin NPH Human (NOVOLIN N) 100 UNIT/ML injection INJECT 45 - 55 UNITS UNDER THE SKIN BEFORE BREAKFAST AND DINNER  . insulin regular (NOVOLIN R) 100 units/mL injection INJECT 15-30 UNITS THREE TIMES DAILY BEFORE MEALS AS DIRECTED  . Lancets Misc. (ACCU-CHEK MULTICLIX LANCET DEV) KIT Use to check sugar 4 times daily  . lisinopril (ZESTRIL) 30 MG tablet TAKE ONE TABLET BY MOUTH DAILY  . metoprolol tartrate (LOPRESSOR) 50 MG tablet TAKE ONE TABLET BY MOUTH DAILY  . NEEDLE, DISP, 30 G (B-D DISP NEEDLE 30GX1") 30G X 1" MISC by Does not apply route as directed.    Marland Kitchen  ONETOUCH VERIO test strip USE TO TEST BLOOD SUGAR 4 TIMES DAILY AS NEEDED  . simvastatin (ZOCOR) 40 MG tablet TAKE ONE TABLET BY MOUTH AT BEDTIME   No facility-administered encounter medications on file as of 01/28/2020.     Current Diagnosis/Assessment:  Goals Addressed            This Visit's Progress   . Pharmacy Care Plan       CARE PLAN ENTRY  Current  Barriers:  . Chronic Disease Management support, education, and care coordination needs related to CAD, HTN, HLD, DMII, and low back pain  Pharmacist Clinical Goal(s):  Marland Kitchen Diabetes  . Achieve A1c <7.0% or 8.0% (based on Dr. Renne Crigler) . Maintain up to date on diabetes preventative health (eye exams every 1 to 2 years and foot exams at least once yearly).  . Blood pressure . Maintain blood pressure within goal of your provider (130/80 or 140/90)  . Maintain low salt diet.  . High cholesterol:  . Cholesterol goals: Total Cholesterol goal under 200, Triglycerides goal under 150, HDL goal above 40 (men) or above 50 (women), LDL goal under 70.  Interventions: . Comprehensive medication review performed. . Diabetes . Discussed importance of diabetes preventative health exams. . Obtain diabetic eye exam every 1 to 2 years.  . Obtain at least once yearly foot exams. . Continue:  - insulin NPH, inject 45 units before breakfast, 55 units at bedtime - insulin R, inject 30 units before breakfast, 10-15 units before lunch, and 30-35 units before dinner - If snacking, inject 5-10 units of regular insulin (R). . Blood pressure:  . DASH diet:  following a diet emphasizing fruits and vegetables and low-fat dairy products along with whole grains, fish, poultry, and nuts. Reducing red meats and sugars. (patient eats: 3 meals/day and tries to incorporate vegetables).  . Reducing the amount of salt intake to <1569m/per day.  . Recommend using a salt substitute to replace your salt if you need flavor.    . Weight reduction- We discussed losing 5-10% of body weight. . Continue . amlodipine 189m 1 tablet daily . lisinopril 303m1 tablet daily . metoprolol tartrate 53m10m tablet daily . Cholesterol . How to reduce cholesterol through diet/weight management and physical activity.    . We discussed how a diet high in plant sterols (fruits/vegetables/nuts/whole grains/legumes) may reduce your cholesterol.   Encouraged increasing fiber to a daily intake of 10-25g/day.  . Continue:  . simvastatin 40mg57mtablet at bedtime  . fenofibrate 145mg,4mablet daily  Patient Self Care Activities:  . Self administers medications as prescribed and Calls provider office for new concerns or questions . Continue current medications as directed by providers.  . Continue following up with specialists. . Continue at home blood pressure readings. . Continue checking blood sugars.  . Continue working on health habits (diet/ exercise).  Please see past updates related to this goal by clicking on the "Past Updates" button in the selected goal        SDOH Interventions     Most Recent Value  SDOH Interventions  Financial Strain Interventions Other (Comment)  [patient declined help with patient assistance for medicines at this time]  Transportation Interventions Intervention Not Indicated     Diabetes  Patient and wife have been receiving phone [place] blood [place] [facility] [place].  Recent Relevant Labs: Lab Results  Component Value Date/Time   HGBA1C 8.2 (A) 11/29/2019 11:30 AM  HGBA1C 7.6 (A) 07/24/2019 11:38 AM   HGBA1C 7.4 (H) 10/10/2014 08:26 AM   HGBA1C 7.8 (H) 07/12/2014 08:20 AM   MICROALBUR 7.6 (H) 02/01/2014 08:01 AM   MICROALBUR 5.3 (H) 07/27/2013 08:05 AM    Checking BG: 3x per Day  A1c: Sept 12 131, 191, 139 (early am), 161, 160  Patient has failed these meds in past: Trulicity, metformin (CKD)  Patient is uncontrolled (but A1c improving) on following medications:  - insulin NPH, inject 45 units before breakfast, 55 units at bedtime - insulin R, inject 30 units before breakfast, 10-15 units before lunch, and 30-35 units before dinner   Last diabetic Eye exam:  Lab Results  Component Value Date/Time   HMDIABEYEEXA Retinopathy (A) 12/21/2018 12:00 AM  - Sees Dr. Radene Ou   Last diabetic Foot exam:  Lab Results  Component Value Date/Time     HMDIABFOOTEX Done 12/27/2008 12:00 AM  - notes some numbness/ tingling in his feet  - discussed daily feet inspections   We discussed:  how to recognize and treat signs of hypoglycemia . Preventative diabetic health exams . Trying Ozempic patient assistance again - pt and wife did not wish to pursue at this time due to the trouble they had recently with obtaining medicine  Plan Managed by Dr. Renne Crigler (endocrinologist).  Pt unable to obtain Ozempic from [place]. Readdress stopping Ozempic at future follow up if patient wants to pursue PAP again. Continue current medications.   Hypertension   BP today is:  <140/90  Office blood pressures are  BP Readings from Last 3 Encounters:  01/25/20 134/60  12/06/19 (!) 140/56  11/29/19 140/90   Patient has failed these meds in the past: none   Patient checks BP at home infrequently  Patient home BP readings are ranging: 140s/60s mmHg  Patient is controlled on:  - amlodipine 24m, 1 tablet daily - lisinopril 335m 1 tablet daily - metoprolol tartrate 5043m1 tablet daily  We discussed diet and exercise extensively . Reducing the amount of salt intake to <1500m24mr day.  . Recommend using a salt substitute to replace your salt if you need flavor.       Plan Denies dizziness /lightehadedness.   Continue current medications.  Continue checking blood pressure at home.   Hyperlipidemia/ CAD  LDL goal < 70  Lipid Panel     Component Value Date/Time   CHOL 120 09/03/2019 0735   TRIG 186.0 (H) 09/03/2019 0735   HDL 30.60 (L) 09/03/2019 0735   CHOLHDL 4 09/03/2019 0735   VLDL 37.2 09/03/2019 0735   LDLCALC 52 09/03/2019 0735   LDLDIRECT 78.0 06/05/2019 0925   Patient has failed these meds in past: ezetimibe (Zetia), Lipitor (spouse notes patient had issues with this several years ago)  Patient is currently controlled on the following medications: - simvastatin 40mg5mtablet at bedtime  - fenofibrate 145mg,36mablet  daily  - ASA 81mg, 63mblet daily   We discussed:  diet and exercise extensively . How to reduce cholesterol through diet/weight management and physical activity.    . We discussed foods that will affect triglycerides (sweets, alcohol, simple carbs, etc.)  Plan Continue current medications.  Medication Management  Patient organizes medications: patient uses pill box.  Barriers: denies issues obtaining medications.  Follow up Follow up visit with PharmD in 6 months per patient preference.  MadelinJeni SallesD Clinical Pharmacist  [phone]

## 2020-02-04 ENCOUNTER — Telehealth (HOSPITAL_COMMUNITY): Payer: Self-pay | Admitting: *Deleted

## 2020-02-04 NOTE — Telephone Encounter (Signed)
Patient given detailed instructions per Myocardial Perfusion Study Information Sheet for the test on 02/06/20. Patient notified to arrive 15 minutes early and that it is imperative to arrive on time for appointment to keep from having the test rescheduled.  If you need to cancel or reschedule your appointment, please call the office within 24 hours of your appointment. . Patient verbalized understanding. Kirstie Peri

## 2020-02-06 ENCOUNTER — Other Ambulatory Visit: Payer: Self-pay

## 2020-02-06 ENCOUNTER — Ambulatory Visit (HOSPITAL_BASED_OUTPATIENT_CLINIC_OR_DEPARTMENT_OTHER): Payer: HMO

## 2020-02-06 ENCOUNTER — Ambulatory Visit (HOSPITAL_COMMUNITY): Payer: HMO | Attending: Cardiology

## 2020-02-06 DIAGNOSIS — I2510 Atherosclerotic heart disease of native coronary artery without angina pectoris: Secondary | ICD-10-CM

## 2020-02-06 DIAGNOSIS — R0600 Dyspnea, unspecified: Secondary | ICD-10-CM | POA: Diagnosis not present

## 2020-02-06 DIAGNOSIS — R0609 Other forms of dyspnea: Secondary | ICD-10-CM

## 2020-02-06 LAB — ECHOCARDIOGRAM COMPLETE
Area-P 1/2: 4.03 cm2
Height: 67 in
S' Lateral: 3.3 cm
Weight: 3696 oz

## 2020-02-06 LAB — MYOCARDIAL PERFUSION IMAGING
LV dias vol: 82 mL (ref 62–150)
LV sys vol: 29 mL
Peak HR: 90 {beats}/min
Rest HR: 65 {beats}/min
SDS: 0
SRS: 0
SSS: 0
TID: 1.15

## 2020-02-06 MED ORDER — TECHNETIUM TC 99M TETROFOSMIN IV KIT
32.4000 | PACK | Freq: Once | INTRAVENOUS | Status: AC | PRN
  Administered 2020-02-06: 32.4 via INTRAVENOUS
  Filled 2020-02-06: qty 33

## 2020-02-06 MED ORDER — PERFLUTREN LIPID MICROSPHERE
1.0000 mL | INTRAVENOUS | Status: AC | PRN
  Administered 2020-02-06: 2 mL via INTRAVENOUS

## 2020-02-06 MED ORDER — TECHNETIUM TC 99M TETROFOSMIN IV KIT
9.6000 | PACK | Freq: Once | INTRAVENOUS | Status: AC | PRN
  Administered 2020-02-06: 9.6 via INTRAVENOUS
  Filled 2020-02-06: qty 10

## 2020-02-06 MED ORDER — REGADENOSON 0.4 MG/5ML IV SOLN
0.4000 mg | Freq: Once | INTRAVENOUS | Status: AC
  Administered 2020-02-06: 0.4 mg via INTRAVENOUS

## 2020-02-07 DIAGNOSIS — I739 Peripheral vascular disease, unspecified: Secondary | ICD-10-CM | POA: Diagnosis not present

## 2020-02-18 MED FILL — NovoLIN N 100 UNIT/ML SUSP: 100 | 27 days supply | Qty: 30 | Fill #4

## 2020-02-25 ENCOUNTER — Ambulatory Visit: Payer: HMO

## 2020-03-06 ENCOUNTER — Ambulatory Visit: Payer: HMO | Admitting: Cardiovascular Disease

## 2020-03-06 ENCOUNTER — Other Ambulatory Visit: Payer: Self-pay

## 2020-03-06 ENCOUNTER — Encounter (INDEPENDENT_AMBULATORY_CARE_PROVIDER_SITE_OTHER): Payer: Self-pay

## 2020-03-06 ENCOUNTER — Encounter: Payer: Self-pay | Admitting: Cardiovascular Disease

## 2020-03-06 VITALS — BP 138/62 | HR 73 | Ht 67.0 in | Wt 230.6 lb

## 2020-03-06 DIAGNOSIS — I2510 Atherosclerotic heart disease of native coronary artery without angina pectoris: Secondary | ICD-10-CM | POA: Diagnosis not present

## 2020-03-06 DIAGNOSIS — E7800 Pure hypercholesterolemia, unspecified: Secondary | ICD-10-CM | POA: Diagnosis not present

## 2020-03-06 DIAGNOSIS — I10 Essential (primary) hypertension: Secondary | ICD-10-CM | POA: Diagnosis not present

## 2020-03-06 NOTE — Patient Instructions (Signed)
Medication Instructions:  ?No changes ?*If you need a refill on your cardiac medications before your next appointment, please call your pharmacy* ? ? ?Lab Work: ?none ?If you have labs (blood work) drawn today and your tests are completely normal, you will receive your results only by: ?MyChart Message (if you have MyChart) OR ?A paper copy in the mail ?If you have any lab test that is abnormal or we need to change your treatment, we will call you to review the results. ? ? ?Testing/Procedures: ?none ? ? ?Follow-Up: ?At [facility], you and your health needs are our priority.  As part of our continuing mission to provide you with exceptional heart care, we have created designated Provider Care Teams.  These Care Teams include your primary Cardiologist (physician) and Advanced Practice Providers (APPs -  Physician Assistants and Nurse Practitioners) who all work together to provide you with the care you need, when you need it. ? ?We recommend signing up for the patient portal called "MyChart".  Sign up information is provided on this After Visit Summary.  MyChart is used to connect with patients for Virtual Visits (Telemedicine).  Patients are able to view lab/test results, encounter notes, upcoming appointments, etc.  Non-urgent messages can be sent to your provider as well.   ?To learn more about what you can do with MyChart, go to [email or web address].   ? ?Your next appointment:   ?6 month(s) ? ?The format for your next appointment:   ?In Person ? ?Provider:   ?Christopher McAlhany, MD   ? ? ?Other Instructions ?  ?

## 2020-03-06 NOTE — Progress Notes (Signed)
Chief Complaint  Patient presents with  . Follow-up    CAD   History of Present Illness: 79 yo male with history of CAD, DM, HTN, former tobacco abuse, PAD and hyperlipidemia who is here today for cardiac follow up. I saw him as a new consult September 2021 for the evaluation of dyspnea on exertion. He is followed in [facility] by Dr. Trula Slade for PAD with no prior lower extremity procedures. 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 He had followed in the past with Dr. Olevia Perches. He described dyspnea on exertion for several years with no associated chest pain, LE edema, dizziness, near syncope or palpitations. Former smoker but stopped in 1993. Echo 02/06/20 with LVEF=50%, mild LVH, grade 1 diastolic dysfunction. No significant valve disease. Nuclear stress test 02/06/20 with no evidence of ischemia.   He is here today for follow up. The patient denies any chest pain, palpitations, lower extremity edema, orthopnea, PND, dizziness, near syncope or syncope. His dyspnea is unchanged over the past 2-3 years. He is very active. He works at [employer] and has little limitation in his ability to work hard during the day.   Primary Care Physician: [name], NP  Past Medical History:  Diagnosis Date  . CAD (coronary artery disease)   . Colon polyp   . DIABETES MELLITUS, TYPE II 11/03/2006  . HYPERLIPIDEMIA 11/03/2006  . HYPERTENSION 11/03/2006  . PAD (peripheral artery disease) ([place])     Past Surgical History:  Procedure Laterality Date  . ANGIOPLASTY  20 years ago   percutaneous transluminal   . CATARACT EXTRACTION  2002, 2005   bilateral    Current Outpatient Medications  Medication Sig Dispense Refill  . amLODipine (NORVASC) 10 MG tablet TAKE ONE TABLET BY MOUTH DAILY 90 tablet 1  . aspirin 81 MG tablet Take 81 mg by mouth daily.      . Blood Glucose Monitoring Suppl (ONETOUCH VERIO) w/Device KIT 1 each by Does not apply route 4  (four) times daily. 1 kit 0  . fenofibrate (TRICOR) 145 MG tablet TAKE ONE TABLET BY MOUTH DAILY 90 tablet 3  . FLUBLOK QUADRIVALENT 0.5 ML injection     . insulin NPH Human (NOVOLIN N) 100 UNIT/ML injection INJECT 45 - 55 UNITS UNDER THE SKIN BEFORE BREAKFAST AND DINNER 30 mL 11  . insulin regular (NOVOLIN R) 100 units/mL injection INJECT 15-30 UNITS THREE TIMES DAILY BEFORE MEALS AS DIRECTED 30 mL 11  . Lancets Misc. (ACCU-CHEK MULTICLIX LANCET DEV) KIT Use to check sugar 4 times daily 400 each 5  . lisinopril (ZESTRIL) 30 MG tablet TAKE ONE TABLET BY MOUTH DAILY 90 tablet 1  . metoprolol tartrate (LOPRESSOR) 50 MG tablet TAKE ONE TABLET BY MOUTH DAILY 90 tablet 1  . NEEDLE, DISP, 30 G (B-D DISP NEEDLE 30GX1") 30G X 1" MISC by Does not apply route as directed.      [name] VERIO test strip USE TO TEST BLOOD SUGAR 4 TIMES DAILY AS NEEDED 400 strip 10  . simvastatin (ZOCOR) 40 MG tablet TAKE ONE TABLET BY MOUTH AT BEDTIME 90 tablet 1   No current facility-administered medications for this visit.    No Known Allergies  Social History   Socioeconomic History  . Marital status: Married    Spouse name: Not on file  . Number of children: 2  . Years of education: Not on file  . Highest education level: Not  on file  Occupational History  . Occupation: Retired-Truck Geophysicist/field seismologist  Tobacco Use  . Smoking status: Former Smoker    Types: Cigarettes    Quit date: 05/18/1991    Years since quitting: 28.8  . Smokeless tobacco: Never Used  Vaping Use  . Vaping Use: Never used  Substance and Sexual Activity  . Alcohol use: No  . Drug use: No  . Sexual activity: Not on file  Other Topics Concern  . Not on file  Social History Narrative   Works 3rd shift   Regular Exercise-yes   Social Determinants of Radio broadcast assistant Strain: Low Risk   . Difficulty of Paying Living Expenses: Not hard at all  Food Insecurity: No Food Insecurity  . Worried About Charity fundraiser in the Last  Year: Never true  . Ran Out of Food in the Last Year: Never true  Transportation Needs: No Transportation Needs  . Lack of Transportation (Medical): No  . Lack of Transportation (Non-Medical): No  Physical Activity:   . Days of Exercise per Week: Not on file  . Minutes of Exercise per Session: Not on file  Stress:   . Feeling of Stress : Not on file  Social Connections:   . Frequency of Communication with Friends and Family: Not on file  . Frequency of Social Gatherings with Friends and Family: Not on file  . Attends Religious Services: Not on file  . Active Member of Clubs or Organizations: Not on file  . Attends Archivist Meetings: Not on file  . Marital Status: Not on file  Intimate Partner Violence:   . Fear of Current or Ex-Partner: Not on file  . Emotionally Abused: Not on file  . Physically Abused: Not on file  . Sexually Abused: Not on file    Family History  Problem Relation Age of Onset  . COPD Father   . Alcohol abuse Father   . Diabetes Father   . Cancer Sister        breast  . Stroke Mother   . Colon cancer Neg Hx     Review of Systems:  As stated in the HPI and otherwise negative.   BP 138/62   Pulse 73   Ht _0  (1.702 m)   Wt 230 lb 9.6 oz (104.6 kg)   SpO2 98%   BMI 36.12 kg/m   Physical Examination: General: Well developed, well nourished, NAD  HEENT: OP clear, mucus membranes moist  SKIN: warm, dry. No rashes. Neuro: No focal deficits  Musculoskeletal: Muscle strength 5/5 all ext  Psychiatric: Mood and affect normal  Neck: No JVD, no carotid bruits, no thyromegaly, no lymphadenopathy.  Lungs:Clear bilaterally, no wheezes, rhonci, crackles Cardiovascular: Regular rate and rhythm. No murmurs, gallops or rubs. Abdomen:Soft. Bowel sounds present. Non-tender.  Extremities: No lower extremity edema. Pulses are 2 + in the bilateral DP/PT.  EKG:  EKG is not ordered today. The ekg ordered today demonstrates   Echo 02/06/20:  1. Left  ventricular ejection fraction, by estimation, is 50%. The left  ventricle has mildly decreased function. The left ventricle demonstrates  regional wall motion abnormalities with mid-apical inferoseptal and  anteroseptal mild hypokinesis. There is  mild left ventricular hypertrophy. Left ventricular diastolic parameters  are consistent with Grade I diastolic dysfunction (impaired relaxation).  2. Right ventricular systolic function is normal. The right ventricular  size is normal. Tricuspid regurgitation signal is inadequate for assessing  PA pressure.  3. The mitral  valve is normal in structure. No evidence of mitral valve  regurgitation. No evidence of mitral stenosis.  4. The aortic valve is tricuspid. Aortic valve regurgitation is not  visualized. Mild aortic valve sclerosis is present, with no evidence of  aortic valve stenosis.  5. IVC not visualized.   Recent Labs: 06/05/2019: Hemoglobin 14.7; Platelets 319.0; TSH 4.18 09/03/2019: ALT 17; BUN 28; Creatinine, Ser 2.88; Potassium 4.7; Sodium 138   Lipid Panel    Component Value Date/Time   CHOL 120 09/03/2019 0735   TRIG 186.0 (H) 09/03/2019 0735   HDL 30.60 (L) 09/03/2019 0735   CHOLHDL 4 09/03/2019 0735   VLDL 37.2 09/03/2019 0735   LDLCALC 52 09/03/2019 0735   LDLDIRECT 78.0 06/05/2019 0925     Wt Readings from Last 3 Encounters:  03/06/20 230 lb 9.6 oz (104.6 kg)  02/06/20 231 lb (104.8 kg)  01/25/20 231 lb 9.6 oz (105.1 kg)     Other studies Reviewed: Additional studies/ records that were reviewed today include: office notes Review of the above records demonstrates:    Assessment and Plan:   1. CAD without angina: He is known to have CAD by cath in 1994. No cath since then. Nuclear stress test in September 2021 with no ischemia. Echo September 2021 with LVEF=50%. His dyspnea is unchanged over the past few years. Will continue ASA, statin and beta blocker.   2. PAD: Followed in [facility]. Known moderate right iliac  stenosis and CTO of the right SFA.  3. HTN: BP controlled. No changes today.   4. Hyperlipidemia: Lipids controlled. Continue statin.   Current medicines are reviewed at length with the patient today.  The patient does not have concerns regarding medicines.  The following changes have been made:  no change  Labs/ tests ordered today include:   No orders of the defined types were placed in this encounter.    Disposition:   FU with me in 6 months   Signed, Lauree Chandler, MD 03/06/2020 9:09 AM    [facility] [place], [place], [place]  [address] Phone: [phone]; Fax: [phone]

## 2020-03-18 MED FILL — NovoLIN N 100 UNIT/ML SUSP: 100 | 27 days supply | Qty: 30 | Fill #5

## 2020-03-18 MED FILL — NovoLIN R 100 UNIT/ML SOLN: 100 | 33 days supply | Qty: 30 | Fill #3

## 2020-03-20 ENCOUNTER — Other Ambulatory Visit: Payer: Self-pay

## 2020-03-20 NOTE — Patient Outreach (Signed)
  [facility]) Care Management Chronic Special Needs Program    03/20/2020  Name: ATLEE KLUTH, DOB: 03-24-1941  MRN: [id number]   [facility] Management will continue to provide services for this member through 05/16/2020. The [facility] Care Management Team will assume care 05/17/2020.   Thea Silversmith, RN, MSN, [place] [phone]

## 2020-04-17 ENCOUNTER — Ambulatory Visit: Payer: HMO | Admitting: Internal Medicine

## 2020-04-17 ENCOUNTER — Encounter: Payer: Self-pay | Admitting: Internal Medicine

## 2020-04-17 ENCOUNTER — Other Ambulatory Visit: Payer: Self-pay

## 2020-04-17 VITALS — BP 130/78 | HR 70 | Ht 68.0 in | Wt 228.6 lb

## 2020-04-17 DIAGNOSIS — Z6834 Body mass index (BMI) 34.0-34.9, adult: Secondary | ICD-10-CM | POA: Diagnosis not present

## 2020-04-17 DIAGNOSIS — E1165 Type 2 diabetes mellitus with hyperglycemia: Secondary | ICD-10-CM | POA: Diagnosis not present

## 2020-04-17 DIAGNOSIS — IMO0002 Reserved for concepts with insufficient information to code with codable children: Secondary | ICD-10-CM

## 2020-04-17 DIAGNOSIS — E669 Obesity, unspecified: Secondary | ICD-10-CM

## 2020-04-17 DIAGNOSIS — Z794 Long term (current) use of insulin: Secondary | ICD-10-CM | POA: Diagnosis not present

## 2020-04-17 DIAGNOSIS — E785 Hyperlipidemia, unspecified: Secondary | ICD-10-CM | POA: Diagnosis not present

## 2020-04-17 DIAGNOSIS — E1159 Type 2 diabetes mellitus with other circulatory complications: Secondary | ICD-10-CM

## 2020-04-17 LAB — POCT GLYCOSYLATED HEMOGLOBIN (HGB A1C): Hemoglobin A1C: 8.4 % — AB (ref 4.0–5.6)

## 2020-04-17 NOTE — Addendum Note (Signed)
Addended by: Lauralyn Primes on: 04/17/2020 09:55 AM   Modules accepted: Orders

## 2020-04-17 NOTE — Progress Notes (Signed)
Patient ID: Eric Choi, male   DOB: 11-15-1940, 79 y.o.   MRN: [id number]  This visit occurred during the SARS-CoV-2 public health emergency.  Safety protocols were in place, including screening questions prior to the visit, additional usage of staff PPE, and extensive cleaning of exam room while observing appropriate contact time as indicated for disinfecting solutions.  HPI: Eric Choi is a 79 y.o.-year-old male, returning for f/u for DM2 dx 1990, insulin-dependent since 2005, uncontrolled, with complications (CAD, CKD, PN, DR, PVD). Last visit was 4 months ago.  Reviewed HbA1c levels: Lab Results  Component Value Date   HGBA1C 8.2 (A) 11/29/2019   HGBA1C 7.6 (A) 07/24/2019   HGBA1C 7.7 (A) 02/22/2019   He is on: Insulin Before breakfast Before lunch Before dinner Bedtime  Regular 30-35 units  15-20 units 35 units -  NPH 45 units   55 units    - Ozempic 0.5 mg weekly -restarted 11/2018 >> now on 1 mg weekly - Pt assistance pgm - was off for 2 months at last visit  We tried U500 insulin but this was too expensive. He did not start Trulicity due to price - 145$.  Pt checks his sugars 3-4 times a day per review of his meter downloads: - am:  104-161, 189 >> 106-206 >> 103-203, 248 >> 77-170, 223 - 1-2h after b'fast:  65 >> 165 >> 182, 251 >> n/c >> 264, 444 - before lunch: 93-115, 142 >> 85-190, 255, 280 >> 58, 60-122, 224 - 2h after lunch:234 >> 136-188 >> n/c >> n/c >> 266 - before dinner: 72-149 >> 116-169 >> 123, 141, 229 >> 51, 160-195, 313 - 1-2h after dinner: 162 >> n/c >> 150-211 >> 139, 194 >> n/c - bedtime:  n/c >> 187 >> 131 >> 72-140, 190 >> n/c - nighttime: 105-234 >> 175-229 >> n/c >> 68, 77-232 Lowest CBG: 50s >> 72 >> 85 >> 51; he has hypoglycemia awareness in the 90s.. Highest CBG: 324 >>... 200 >> 206 >> 280 >> 444.  Pt's meals are: - Breakfast: cereal + milk + eggs + bacon/sausage - Lunch: egg sandwich - Dinner: meat + veggies + starch - Snacks: 1-2:  including after dinner; pears + mayonnaise, milk + crackers; cake  He drives a truck.  -+ CKD, last BUN/creatinine:  Lab Results  Component Value Date   BUN 28 (H) 09/03/2019   CREATININE 2.88 (H) 09/03/2019  On lisinopril. -+ HL; last set of lipids: Lab Results  Component Value Date   CHOL 120 09/03/2019   HDL 30.60 (L) 09/03/2019   LDLCALC 52 09/03/2019   LDLDIRECT 78.0 06/05/2019   TRIG 186.0 (H) 09/03/2019   CHOLHDL 4 09/03/2019  On Zocor 40, fenofibrate 145.   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 Dr. Radene Ou. - + numbness and tingling in his feet.  He also has HTN.  Latest TSH was normal: Lab Results  Component Value Date   TSH 4.18 06/05/2019   He lost his twin brother to cancer 05/2018.  ROS: Constitutional: no weight gain/+ weight loss, no fatigue, no subjective hyperthermia, no subjective hypothermia Eyes: no blurry vision, no xerophthalmia ENT: no sore throat, no nodules palpated in neck, no dysphagia, no odynophagia, no hoarseness Cardiovascular: no CP/no SOB/no palpitations/no leg swelling Respiratory: no cough/no SOB/no wheezing Gastrointestinal: no N/no V/no D/no C/no acid reflux Musculoskeletal: no muscle aches/+ joint aches Skin: no rashes, no hair loss Neurological: no tremors/+ numbness/+ tingling/no dizziness  I reviewed pt's medications, allergies, PMH, social hx, family hx, and changes were documented in the history of present illness. Otherwise, unchanged from my initial visit note.  Past Medical History:  Diagnosis Date  . CAD (coronary artery disease)   . Colon polyp   . DIABETES MELLITUS, TYPE II 11/03/2006  . HYPERLIPIDEMIA 11/03/2006  . HYPERTENSION 11/03/2006  . PAD (peripheral artery disease) ([place])    Past Surgical History:  Procedure Laterality Date  . ANGIOPLASTY  20 years ago   percutaneous transluminal   . CATARACT EXTRACTION  2002, 2005   bilateral   Social History   Socioeconomic  History  . Marital status: Married    Spouse name: Not on file  . Number of children: 2  . Years of education: Not on file  . Highest education level: Not on file  Occupational History  . Occupation: Retired-Truck Geophysicist/field seismologist  Tobacco Use  . Smoking status: Former Smoker    Types: Cigarettes    Quit date: 05/18/1991    Years since quitting: 28.9  . Smokeless tobacco: Never Used  Vaping Use  . Vaping Use: Never used  Substance and Sexual Activity  . Alcohol use: No  . Drug use: No  . Sexual activity: Not on file  Other Topics Concern  . Not on file  Social History Narrative   Works 3rd shift   Regular Exercise-yes   Social Determinants of Radio broadcast assistant Strain: Low Risk   . Difficulty of Paying Living Expenses: Not hard at all  Food Insecurity: No Food Insecurity  . Worried About Charity fundraiser in the Last Year: Never true  . Ran Out of Food in the Last Year: Never true  Transportation Needs: No Transportation Needs  . Lack of Transportation (Medical): No  . Lack of Transportation (Non-Medical): No  Physical Activity:   . Days of Exercise per Week: Not on file  . Minutes of Exercise per Session: Not on file  Stress:   . Feeling of Stress : Not on file  Social Connections:   . Frequency of Communication with Friends and Family: Not on file  . Frequency of Social Gatherings with Friends and Family: Not on file  . Attends Religious Services: Not on file  . Active Member of Clubs or Organizations: Not on file  . Attends Archivist Meetings: Not on file  . Marital Status: Not on file  Intimate Partner Violence:   . Fear of Current or Ex-Partner: Not on file  . Emotionally Abused: Not on file  . Physically Abused: Not on file  . Sexually Abused: Not on file   Current Outpatient Medications on File Prior to Visit  Medication Sig Dispense Refill  . amLODipine (NORVASC) 10 MG tablet TAKE ONE TABLET BY MOUTH DAILY 90 tablet 1  . aspirin 81 MG tablet  Take 81 mg by mouth daily.      . Blood Glucose Monitoring Suppl (ONETOUCH VERIO) w/Device KIT 1 each by Does not apply route 4 (four) times daily. 1 kit 0  . fenofibrate (TRICOR) 145 MG tablet TAKE ONE TABLET BY MOUTH DAILY 90 tablet 3  . FLUBLOK QUADRIVALENT 0.5 ML injection     . insulin NPH Human (NOVOLIN N) 100 UNIT/ML injection INJECT 45 - 55 UNITS UNDER THE SKIN BEFORE BREAKFAST AND DINNER 30 mL 11  . insulin regular (NOVOLIN R) 100 units/mL injection INJECT 15-30 UNITS THREE TIMES DAILY BEFORE MEALS AS DIRECTED 30 mL 11  . Lancets  Misc. (ACCU-CHEK MULTICLIX LANCET DEV) KIT Use to check sugar 4 times daily 400 each 5  . lisinopril (ZESTRIL) 30 MG tablet TAKE ONE TABLET BY MOUTH DAILY 90 tablet 1  . metoprolol tartrate (LOPRESSOR) 50 MG tablet TAKE ONE TABLET BY MOUTH DAILY 90 tablet 1  . NEEDLE, DISP, 30 G (B-D DISP NEEDLE 30GX1") 30G X 1" MISC by Does not apply route as directed.      [name] VERIO test strip USE TO TEST BLOOD SUGAR 4 TIMES DAILY AS NEEDED 400 strip 10  . simvastatin (ZOCOR) 40 MG tablet TAKE ONE TABLET BY MOUTH AT BEDTIME 90 tablet 1   No current facility-administered medications on file prior to visit.   No Known Allergies Family History  Problem Relation Age of Onset  . COPD Father   . Alcohol abuse Father   . Diabetes Father   . Cancer Sister        breast  . Stroke Mother   . Colon cancer Neg Hx     PE: BP 130/78   Pulse 70   Ht _0  (1.727 m)   Wt 228 lb 9.6 oz (103.7 kg)   SpO2 96%   BMI 34.76 kg/m  Body mass index is 34.76 kg/m.  Wt Readings from Last 3 Encounters:  04/17/20 228 lb 9.6 oz (103.7 kg)  03/06/20 230 lb 9.6 oz (104.6 kg)  02/06/20 231 lb (104.8 kg)   Constitutional: overweight, in NAD Eyes: PERRLA, EOMI, no exophthalmos ENT: moist mucous membranes, no thyromegaly, no cervical lymphadenopathy Cardiovascular: RRR, No MRG Respiratory: CTA B Gastrointestinal: abdomen soft, NT, ND, BS+ Musculoskeletal: no deformities,  strength intact in all 4 Skin: moist, warm, no rashes Neurological: no tremor with outstretched hands, DTR normal in all 4  ASSESSMENT: 1. DM2, uncontrolled, insulin-dep, with complications - CAD - CKD - PN - DR  - Tried to obtain U500 insulin but this was too expensive >> we had to go back to N and R insulin  2. HL  3.  Obesity class 1  PLAN:  1. Patient with longstanding, uncontrolled, type 2 diabetes, on basal-bolus insulin regimen with NPH and regular insulin due to price.  For the 2 months prior to our last visit, he had to come off Ozempic because he could not get it through patient assistance.  We restarted it at last visit (we submitted another application for patient assistance).  At that time, sugars are fluctuating, depending on dietary indiscretions.  Sugars are higher after coming off Ozempic.  He was off a plant-based diet, which was working very well for him in the past.  However, his diet now includes more fruits and vegetables and less fatty foods.  At last visit, HbA1c was higher, at 8.2%. -At today's visit, per review of his meter download, sugars are much more variable, from 51-444.  Upon questioning, he is having more dietary indiscretions (for example eggnog) -we discussed about trying to limit these.  He is already using a slightly lower dose of insulin before lunch as he did notice blood sugars in the middle of the day and later in the day especially when he is active (Saturday and Sundays, when he [place] [employer]).  At this visit, I advised him to reduce the regular insulin with breakfast whenever he plans to be more active late during the day.  I advised him to keep an eye on his midday blood sugars afterwards and we may need to reduce his NPH dose next  if the sugars are still low. -He tells me he did not obtain Ozempic from the patient assistance.  I advised him to call the program again after the first of the year.  We already sent his application twice now. -I  advised him to: Patient Instructions   Please continue: Insulin Before breakfast Before lunch Before dinner Bedtime  Regular 30-35 units (when you are active, use 20-25 units) 15-20 units 30-35 units -  NPH 45 units   55 units   Please call [place] Patient Assn. Program to see if we can start:  - Ozempic 1 mg weekly.  If you do have a snack at night, may need to cover this with 5-10 units of regular insulin (R).  Please come back for a follow-up appointment in 4 months.   - we checked his HbA1c: 8.4% (higher) - advised to check sugars at different times of the day - 3x a day, rotating check times - advised for yearly eye exams >> he is UTD - return to clinic in 4 months   2. HL -Reviewed latest lipid panel from 08/2019: LDL at goal, triglycerides slightly high, HDL slightly low: Lab Results  Component Value Date   CHOL 120 09/03/2019   HDL 30.60 (L) 09/03/2019   LDLCALC 52 09/03/2019   LDLDIRECT 78.0 06/05/2019   TRIG 186.0 (H) 09/03/2019   CHOLHDL 4 09/03/2019  -He continues on Zocor 40, fenofibrate 145.  She is seen in the lipid clinic.  3.  Obesity class 1 -Before the coronavirus pandemic, he was exercising at [employer], but afterwards he could not exercise due to back pain with sciatica and leg weakness.  -She relaxed his diet since last visit.  Discussed about improving it. -At last visit, we tried to [facility], which should have also helped with weight loss, but he did not receive it -Weight was stable at last visit and lost 6 pounds since then  Philemon Kingdom, MD PhD [facility] Endocrinology

## 2020-04-17 NOTE — Patient Instructions (Signed)
Please continue: Insulin Before breakfast Before lunch Before dinner Bedtime  Regular 30-35 units (when you are active, use 20-25 units) 15-20 units 30-35 units -  NPH 45 units   55 units   Please call [place] Patient Assn. Program to see if we can start:  - Ozempic 1 mg weekly.  If you do have a snack at night, may need to cover this with 5-10 units of regular insulin (R).  Please come back for a follow-up appointment in 4 months.

## 2020-04-22 MED FILL — NovoLIN N 100 UNIT/ML SUSP: 100 | 27 days supply | Qty: 30 | Fill #6

## 2020-04-22 MED FILL — NovoLIN R 100 UNIT/ML SOLN: 100 | 33 days supply | Qty: 30 | Fill #4

## 2020-05-21 ENCOUNTER — Other Ambulatory Visit: Payer: Self-pay

## 2020-05-21 MED FILL — NovoLIN N 100 UNIT/ML SUSP: 100 | 27 days supply | Qty: 30 | Fill #7

## 2020-05-21 MED FILL — NovoLIN R 100 UNIT/ML SOLN: 100 | 33 days supply | Qty: 30 | Fill #5

## 2020-06-10 ENCOUNTER — Other Ambulatory Visit: Payer: Self-pay

## 2020-06-11 ENCOUNTER — Ambulatory Visit (INDEPENDENT_AMBULATORY_CARE_PROVIDER_SITE_OTHER): Payer: HMO | Admitting: Adult Health

## 2020-06-11 ENCOUNTER — Encounter: Payer: Self-pay | Admitting: Adult Health

## 2020-06-11 VITALS — BP 140/64 | Temp 97.6°F | Ht 68.0 in | Wt 233.0 lb

## 2020-06-11 DIAGNOSIS — R351 Nocturia: Secondary | ICD-10-CM | POA: Diagnosis not present

## 2020-06-11 DIAGNOSIS — E1159 Type 2 diabetes mellitus with other circulatory complications: Secondary | ICD-10-CM

## 2020-06-11 DIAGNOSIS — I2510 Atherosclerotic heart disease of native coronary artery without angina pectoris: Secondary | ICD-10-CM | POA: Diagnosis not present

## 2020-06-11 DIAGNOSIS — Z794 Long term (current) use of insulin: Secondary | ICD-10-CM | POA: Diagnosis not present

## 2020-06-11 DIAGNOSIS — Z0000 Encounter for general adult medical examination without abnormal findings: Secondary | ICD-10-CM

## 2020-06-11 DIAGNOSIS — E782 Mixed hyperlipidemia: Secondary | ICD-10-CM | POA: Diagnosis not present

## 2020-06-11 DIAGNOSIS — E1165 Type 2 diabetes mellitus with hyperglycemia: Secondary | ICD-10-CM | POA: Diagnosis not present

## 2020-06-11 DIAGNOSIS — I10 Essential (primary) hypertension: Secondary | ICD-10-CM

## 2020-06-11 DIAGNOSIS — IMO0002 Reserved for concepts with insufficient information to code with codable children: Secondary | ICD-10-CM

## 2020-06-11 DIAGNOSIS — N401 Enlarged prostate with lower urinary tract symptoms: Secondary | ICD-10-CM

## 2020-06-11 DIAGNOSIS — E669 Obesity, unspecified: Secondary | ICD-10-CM | POA: Diagnosis not present

## 2020-06-11 LAB — CBC WITH DIFFERENTIAL/PLATELET
Basophils Absolute: 0.1 10*3/uL (ref 0.0–0.1)
Basophils Relative: 1 % (ref 0.0–3.0)
Eosinophils Absolute: 0.6 10*3/uL (ref 0.0–0.7)
Eosinophils Relative: 6.1 % — ABNORMAL HIGH (ref 0.0–5.0)
HCT: 37.5 % — ABNORMAL LOW (ref 39.0–52.0)
Hemoglobin: 12.9 g/dL — ABNORMAL LOW (ref 13.0–17.0)
Lymphocytes Relative: 24 % (ref 12.0–46.0)
Lymphs Abs: 2.5 10*3/uL (ref 0.7–4.0)
MCHC: 34.4 g/dL (ref 30.0–36.0)
MCV: 88.7 fl (ref 78.0–100.0)
Monocytes Absolute: 1 10*3/uL (ref 0.1–1.0)
Monocytes Relative: 9.9 % (ref 3.0–12.0)
Neutro Abs: 6 10*3/uL (ref 1.4–7.7)
Neutrophils Relative %: 59 % (ref 43.0–77.0)
Platelets: 356 10*3/uL (ref 150.0–400.0)
RBC: 4.23 Mil/uL (ref 4.22–5.81)
RDW: 15.4 % (ref 11.5–15.5)
WBC: 10.3 10*3/uL (ref 4.0–10.5)

## 2020-06-11 LAB — COMPREHENSIVE METABOLIC PANEL
ALT: 18 U/L (ref 0–53)
AST: 16 U/L (ref 0–37)
Albumin: 4.4 g/dL (ref 3.5–5.2)
Alkaline Phosphatase: 42 U/L (ref 39–117)
BUN: 35 mg/dL — ABNORMAL HIGH (ref 6–23)
CO2: 23 mEq/L (ref 19–32)
Calcium: 10 mg/dL (ref 8.4–10.5)
Chloride: 104 mEq/L (ref 96–112)
Creatinine, Ser: 2.78 mg/dL — ABNORMAL HIGH (ref 0.40–1.50)
GFR: 21.21 mL/min — ABNORMAL LOW (ref >60.00)
Glucose, Bld: 119 mg/dL — ABNORMAL HIGH (ref 70–99)
Potassium: 4.8 mEq/L (ref 3.5–5.1)
Sodium: 137 mEq/L (ref 135–145)
Total Bilirubin: 0.4 mg/dL (ref 0.2–1.2)
Total Protein: 7.1 g/dL (ref 6.0–8.3)

## 2020-06-11 LAB — LIPID PANEL
Cholesterol: 171 mg/dL (ref 0–200)
HDL: 32 mg/dL — ABNORMAL LOW (ref >39.00)
Total CHOL/HDL Ratio: 5
Triglycerides: 512 mg/dL — ABNORMAL HIGH (ref 0.0–149.0)

## 2020-06-11 LAB — TSH: TSH: 4.52 u[IU]/mL — ABNORMAL HIGH (ref 0.35–4.50)

## 2020-06-11 LAB — LDL CHOLESTEROL, DIRECT: Direct LDL: 73 mg/dL

## 2020-06-11 LAB — PSA: PSA: 0.78 ng/mL (ref 0.10–4.00)

## 2020-06-11 NOTE — Progress Notes (Signed)
Subjective:    Patient ID: Eric Choi, male    DOB: [birth year], 80 y.o.   MRN: [id number]  HPI  Patient presents for yearly preventative medicine examination. He is a pleasant 80 year old male who  has a past medical history of CAD (coronary artery disease), Colon polyp, DIABETES MELLITUS, TYPE II (11/03/2006), HYPERLIPIDEMIA (11/03/2006), HYPERTENSION (11/03/2006), and PAD (peripheral artery disease) ([place]).  Essential Hypertension -he is currently prescribed Norvasc 10 mg, lisinopril 30 mg, and metoprolol 50 mg.  He denies dizziness, lightheadedness, chest pain, shortness of breath, or syncopal episodes.  He does monitor his blood pressure at home and reports readings between 120 and 130 over 70s to 80s.  Hyperlipidemia-takes Zocor 40 mg and fenofibrate 145 mg.  He denies myalgia or fatigue with these medications Lab Results  Component Value Date   CHOL 120 09/03/2019   HDL 30.60 (L) 09/03/2019   LDLCALC 52 09/03/2019   LDLDIRECT 78.0 06/05/2019   TRIG 186.0 (H) 09/03/2019   CHOLHDL 4 09/03/2019     DM-is managed by endocrinology.  Currently prescribed Novolin and 45 to 55 units before breakfast and lunch, Novolin R 15 to 30 units 3 times daily, and Ozempic 1 mg weekly. He reports that his insurance has not covered his Ozempic. Paperwork was filled out for the patient assistance program but he has not called them again after the new year. He is trying to eat healthier with more fruits and vegetables.   Lab Results  Component Value Date   HGBA1C 8.4 (A) 04/17/2020   CAD -is followed by cardiology.  Had a cath in 1994 but no cath since then.  Nuclear stress test in September 2021 with no ischemia.  Echo September 21 with LVEF equal 50%.  Continues to have dyspnea that is unchanged over the past few years.  Was last seen by cardiology in October 2021 and they continued him on aspirin, statin, beta-blocker  PAD -is followed by vein and vascular, Dr. Russella Dar.  He has never had any lower  extremity procedures.  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  Appropriate screening laboratory values were ordered for the patient including screening of hyperlipidemia, renal function and hepatic function. If indicated by BPH, a PSA was ordered.  Medication reconciliation,  past medical history, social history, problem list and allergies were reviewed in detail with the patient  Goals were established with regard to weight loss, exercise, and  diet in compliance with medications Wt Readings from Last 3 Encounters:  06/11/20 233 lb (105.7 kg)  04/17/20 228 lb 9.6 oz (103.7 kg)  03/06/20 230 lb 9.6 oz (104.6 kg)    Review of Systems  Constitutional: Negative.   HENT: Negative.   Eyes: Negative.   Respiratory: Positive for shortness of breath.   Cardiovascular: Negative.   Gastrointestinal: Negative.   Endocrine: Negative.   Genitourinary: Negative.   Musculoskeletal: Positive for arthralgias and back pain.  Skin: Negative.   Allergic/Immunologic: Negative.   Neurological: Negative.   Hematological: Negative.   Psychiatric/Behavioral: Negative.   All other systems reviewed and are negative.  Past Medical History:  Diagnosis Date  . CAD (coronary artery disease)   . Colon polyp   . DIABETES MELLITUS, TYPE II 11/03/2006  . HYPERLIPIDEMIA 11/03/2006  . HYPERTENSION 11/03/2006  . PAD (peripheral artery disease) ([place])     Social History  Socioeconomic History  . Marital status: Married    Spouse name: Not on file  . Number of children: 2  . Years of education: Not on file  . Highest education level: Not on file  Occupational History  . Occupation: Retired-Truck Geophysicist/field seismologist  Tobacco Use  . Smoking status: Former Smoker    Types: Cigarettes    Quit date: 05/18/1991    Years since quitting: 29.0  . Smokeless tobacco: Never Used  Vaping  Use  . Vaping Use: Never used  Substance and Sexual Activity  . Alcohol use: No  . Drug use: No  . Sexual activity: Not on file  Other Topics Concern  . Not on file  Social History Narrative   Works 3rd shift   Regular Exercise-yes   Social Determinants of Radio broadcast assistant Strain: Low Risk   . Difficulty of Paying Living Expenses: Not hard at all  Food Insecurity: No Food Insecurity  . Worried About Charity fundraiser in the Last Year: Never true  . Ran Out of Food in the Last Year: Never true  Transportation Needs: No Transportation Needs  . Lack of Transportation (Medical): No  . Lack of Transportation (Non-Medical): No  Physical Activity: Not on file  Stress: Not on file  Social Connections: Not on file  Intimate Partner Violence: Not on file    Past Surgical History:  Procedure Laterality Date  . ANGIOPLASTY  20 years ago   percutaneous transluminal   . CATARACT EXTRACTION  2002, 2005   bilateral    Family History  Problem Relation Age of Onset  . COPD Father   . Alcohol abuse Father   . Diabetes Father   . Cancer Sister        breast  . Stroke Mother   . Colon cancer Neg Hx     No Known Allergies  Current Outpatient Medications on File Prior to Visit  Medication Sig Dispense Refill  . amLODipine (NORVASC) 10 MG tablet TAKE ONE TABLET BY MOUTH DAILY 90 tablet 1  . aspirin 81 MG tablet Take 81 mg by mouth daily.    . Blood Glucose Monitoring Suppl (ONETOUCH VERIO) w/Device KIT 1 each by Does not apply route 4 (four) times daily. 1 kit 0  . fenofibrate (TRICOR) 145 MG tablet TAKE ONE TABLET BY MOUTH DAILY 90 tablet 3  . FLUBLOK QUADRIVALENT 0.5 ML injection     . insulin NPH Human (NOVOLIN N) 100 UNIT/ML injection INJECT 45 - 55 UNITS UNDER THE SKIN BEFORE BREAKFAST AND DINNER 30 mL 11  . insulin regular (NOVOLIN R) 100 units/mL injection INJECT 15-30 UNITS THREE TIMES DAILY BEFORE MEALS AS DIRECTED 30 mL 11  . Lancets Misc. (ACCU-CHEK MULTICLIX  LANCET DEV) KIT Use to check sugar 4 times daily 400 each 5  . lisinopril (ZESTRIL) 30 MG tablet TAKE ONE TABLET BY MOUTH DAILY 90 tablet 1  . metoprolol tartrate (LOPRESSOR) 50 MG tablet TAKE ONE TABLET BY MOUTH DAILY 90 tablet 1  . NEEDLE, DISP, 30 G 30G X 1" MISC by Does not apply route as directed.    [name] VERIO test strip USE TO TEST BLOOD SUGAR 4 TIMES DAILY AS NEEDED 400 strip 10  . simvastatin (ZOCOR) 40 MG tablet TAKE ONE TABLET BY MOUTH AT BEDTIME 90 tablet 1   No current facility-administered medications on file prior to visit.    BP 140/64   Temp 97.6 F (36.4 C)   Ht 5' 8" (1.727  m)   Wt 233 lb (105.7 kg)   BMI 35.43 kg/m       Objective:   Physical Exam Vitals and nursing note reviewed.  Constitutional:      General: He is not in acute distress.    Appearance: Normal appearance. He is well-developed. He is obese.  HENT:     Head: Normocephalic and atraumatic.     Right Ear: Tympanic membrane, ear canal and external ear normal. There is no impacted cerumen.     Left Ear: Tympanic membrane, ear canal and external ear normal. There is no impacted cerumen.     Nose: Nose normal. No congestion or rhinorrhea.     Mouth/Throat:     Mouth: Mucous membranes are moist.     Pharynx: Oropharynx is clear. No oropharyngeal exudate or posterior oropharyngeal erythema.  Eyes:     General:        Right eye: No discharge.        Left eye: No discharge.     Extraocular Movements: Extraocular movements intact.     Conjunctiva/sclera: Conjunctivae normal.     Pupils: Pupils are equal, round, and reactive to light.  Neck:     Vascular: No carotid bruit.     Trachea: No tracheal deviation.  Cardiovascular:     Rate and Rhythm: Normal rate and regular rhythm.     Pulses: Normal pulses.     Heart sounds: Normal heart sounds. No murmur heard. No friction rub. No gallop.   Pulmonary:     Effort: Pulmonary effort is normal. No respiratory distress.     Breath sounds: Normal  breath sounds. No stridor. No wheezing, rhonchi or rales.     Comments: Mild DOE with exertion   Chest:     Chest wall: No tenderness.  Abdominal:     General: Bowel sounds are normal. There is no distension.     Palpations: Abdomen is soft. There is no mass.     Tenderness: There is no abdominal tenderness. There is no right CVA tenderness, left CVA tenderness, guarding or rebound.     Hernia: No hernia is present.  Musculoskeletal:        General: No swelling, tenderness, deformity or signs of injury. Normal range of motion.     Right lower leg: No edema.     Left lower leg: No edema.  Lymphadenopathy:     Cervical: No cervical adenopathy.  Skin:    General: Skin is warm and dry.     Capillary Refill: Capillary refill takes less than 2 seconds.     Coloration: Skin is not jaundiced or pale.     Findings: No bruising, erythema, lesion or rash.  Neurological:     General: No focal deficit present.     Mental Status: He is alert and oriented to person, place, and time.     Cranial Nerves: No cranial nerve deficit.     Sensory: No sensory deficit.     Motor: No weakness.     Coordination: Coordination normal.     Gait: Gait normal.     Deep Tendon Reflexes: Reflexes normal.  Psychiatric:        Mood and Affect: Mood normal.        Behavior: Behavior normal.        Thought Content: Thought content normal.        Judgment: Judgment normal.        Assessment & Plan:  1. Routine general medical examination  at a health care facility - Encouraged weight loss through diet and exercise - Follow up in one year or sooner if needed - CBC with Differential/Platelet; Future - Comprehensive metabolic panel; Future - Lipid panel; Future - TSH; Future  2. Hypertriglyceridemia - Continue stain and fenofibrate  - CBC with Differential/Platelet; Future - Comprehensive metabolic panel; Future - Lipid panel; Future - TSH; Future  3. Coronary artery disease involving native coronary  artery of native heart without angina pectoris - Continue lipid lowering agents and asa - Follow up with cardiology as directed - CBC with Differential/Platelet; Future - Comprehensive metabolic panel; Future - Lipid panel; Future - TSH; Future  4. Essential hypertension - Better controlled at home  - No change in medications  - CBC with Differential/Platelet; Future - Comprehensive metabolic panel; Future - Lipid panel; Future - TSH; Future  5. Uncontrolled type 2 diabetes mellitus with circulatory disorder, with long-term current use of insulin ([place]) - Advised to try patients assistance program for Ozempic.  - Follow up with endocrinology as directed - CBC with Differential/Platelet; Future - Comprehensive metabolic panel; Future - Lipid panel; Future - TSH; Future   6. BPH associated with nocturia - asymptomatic  - PSA; Future  7. Obesity with serious comorbidity, unspecified classification, unspecified obesity type  - CBC with Differential/Platelet; Future - Comprehensive metabolic panel; Future - Lipid panel; Future - TSH; Future  [name], NP

## 2020-06-12 ENCOUNTER — Other Ambulatory Visit: Payer: Self-pay | Admitting: Family Medicine

## 2020-06-12 MED ORDER — EZETIMIBE 10 MG PO TABS
10.0000 mg | ORAL_TABLET | Freq: Every day | ORAL | 0 refills | Status: DC
Start: 2020-06-12 — End: 2020-09-10

## 2020-06-19 MED FILL — NovoLIN R 100 UNIT/ML SOLN: 100 | 33 days supply | Qty: 30 | Fill #6

## 2020-06-19 MED FILL — NovoLIN N 100 UNIT/ML SUSP: 100 | 27 days supply | Qty: 30 | Fill #8

## 2020-06-23 ENCOUNTER — Other Ambulatory Visit: Payer: Self-pay | Admitting: Adult Health

## 2020-06-23 DIAGNOSIS — E785 Hyperlipidemia, unspecified: Secondary | ICD-10-CM

## 2020-06-24 NOTE — Telephone Encounter (Signed)
Sent to the pharmacy by e-scribe. 

## 2020-07-08 ENCOUNTER — Telehealth: Payer: Self-pay | Admitting: Pharmacist

## 2020-07-08 NOTE — Chronic Care Management (AMB) (Signed)
Chronic Care Management Pharmacy Assistant   Name: Eric Choi  MRN: [id number] DOB: 08-Apr-1941  Reason for Encounter: General Adherence Call  Patient Questions:  1.  Have you seen any other providers since your last visit? Yes o 06-11-20 Eric Peng, NP Family Medicine o 04-17-20 Eric Kingdom, MD Endocrinology o 03-06-20 Eric Blanks, MD Cardiology   2.  Any changes in your medicines or health? No  PCP : Eric Peng, NP Allergies:  No Known Allergies  Medications: Outpatient Encounter Medications as of 07/08/2020  Medication Sig  . amLODipine (NORVASC) 10 MG tablet TAKE ONE TABLET BY MOUTH DAILY  . aspirin 81 MG tablet Take 81 mg by mouth daily.  . Blood Glucose Monitoring Suppl (ONETOUCH VERIO) w/Device KIT 1 each by Does not apply route 4 (four) times daily.  Marland Kitchen ezetimibe (ZETIA) 10 MG tablet Take 1 tablet (10 mg total) by mouth daily.  . fenofibrate (TRICOR) 145 MG tablet TAKE ONE TABLET BY MOUTH DAILY  . FLUBLOK QUADRIVALENT 0.5 ML injection   . insulin NPH Human (NOVOLIN N) 100 UNIT/ML injection INJECT 45 - 55 UNITS UNDER THE SKIN BEFORE BREAKFAST AND DINNER  . insulin regular (NOVOLIN R) 100 units/mL injection INJECT 15-30 UNITS THREE TIMES DAILY BEFORE MEALS AS DIRECTED  . Lancets Misc. (ACCU-CHEK MULTICLIX LANCET DEV) KIT Use to check sugar 4 times daily  . lisinopril (ZESTRIL) 30 MG tablet TAKE ONE TABLET BY MOUTH DAILY  . metoprolol tartrate (LOPRESSOR) 50 MG tablet TAKE ONE TABLET BY MOUTH DAILY  . NEEDLE, DISP, 30 G 30G X 1" MISC by Does not apply route as directed.  [name] VERIO test strip USE TO TEST BLOOD SUGAR 4 TIMES DAILY AS NEEDED  . simvastatin (ZOCOR) 40 MG tablet TAKE ONE TABLET BY MOUTH AT BEDTIME   No facility-administered encounter medications on file as of 07/08/2020.    Current Diagnosis: Patient Active Problem List   Diagnosis Date Noted  . 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 Retinal hemorrhage, bilateral 12/27/2019  . 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  . Vitreomacular adhesion of left eye 12/27/2019  . Vitreomacular adhesion of right eye 12/27/2019  . Obesity 10/19/2018  . BPH associated with nocturia 03/12/2015  . Dysplastic nevi 02/25/2014  . Abdominal pain, left upper quadrant 07/26/2013  . CAD (coronary artery disease) 10/01/2011  . Uncontrolled type 2 diabetes mellitus with circulatory disorder, with long-term current use of insulin ([place]) 07/13/2010  . FOOT PAIN, RIGHT 06/01/2007  . Hyperlipidemia 11/03/2006  . Essential hypertension 11/03/2006    Goals Addressed   None     Follow-Up:  Pharmacist Review and Scheduled Follow-Up With Clinical Pharmacist  I spoke with the patient and his wife and discussed medication adherence with the patient, no issues at this time with current medication. He states that he is doing well currently. He declined a follow-up CCM appointment. His wife states she feels that his providers are doing an efficient job with his medications. And would like to disenroll from the program. He denies any recent emergency room visits. Also, he denies any side effects from the medication that he is currently taking. He is not having any issues with his pharmacy.   Maia Breslow, [place] Assistant [phone]

## 2020-07-17 MED FILL — NovoLIN R 100 UNIT/ML SOLN: 100 | 33 days supply | Qty: 30 | Fill #7

## 2020-07-17 MED FILL — NovoLIN N 100 UNIT/ML SUSP: 100 | 27 days supply | Qty: 30 | Fill #9

## 2020-07-22 ENCOUNTER — Other Ambulatory Visit: Payer: Self-pay | Admitting: Adult Health

## 2020-08-20 ENCOUNTER — Other Ambulatory Visit (HOSPITAL_COMMUNITY): Payer: Self-pay

## 2020-08-21 ENCOUNTER — Other Ambulatory Visit (HOSPITAL_COMMUNITY): Payer: Self-pay

## 2020-08-21 MED FILL — Insulin NPH (Human) (Isophane) Inj 100 Unit/ML: SUBCUTANEOUS | 27 days supply | Qty: 30 | Fill #0 | Status: AC

## 2020-08-21 MED FILL — Insulin Regular (Human) Inj 100 Unit/ML: INTRAMUSCULAR | 33 days supply | Qty: 30 | Fill #0 | Status: AC

## 2020-08-21 NOTE — Progress Notes (Deleted)
Subjective:   Eric Choi is a 80 y.o. male who presents for an Initial Medicare Annual Wellness Visit.   I connected with *** today by telephone and verified that I am speaking with the correct person using two identifiers. Location patient: home Location provider: work Persons participating in the virtual visit: patient, provider.   I discussed the limitations, risks, security and privacy concerns of performing an evaluation and management service by telephone and the availability of in person appointments. I also discussed with the patient that there may be a patient responsible charge related to this service. The patient expressed understanding and verbally consented to this telephonic visit.    Interactive audio and video telecommunications were attempted between this provider and patient, however failed, due to patient having technical difficulties OR patient did not have access to video capability.  We continued and completed visit with audio only.    Review of Systems    N/a        Objective:    There were no vitals filed for this visit. There is no height or weight on file to calculate BMI.  Advanced Directives 08/27/2019 11/02/2018  Does Patient Have a Medical Advance Directive? No No  Would patient like information on creating a medical advance directive? No - Patient declined No - Patient declined    Current Medications (verified) Outpatient Encounter Medications as of 08/22/2020  Medication Sig  . amLODipine (NORVASC) 10 MG tablet TAKE ONE TABLET BY MOUTH DAILY  . aspirin 81 MG tablet Take 81 mg by mouth daily.  . Blood Glucose Monitoring Suppl (ONETOUCH VERIO) w/Device KIT 1 each by Does not apply route 4 (four) times daily.  Marland Kitchen ezetimibe (ZETIA) 10 MG tablet Take 1 tablet (10 mg total) by mouth daily.  . fenofibrate (TRICOR) 145 MG tablet TAKE ONE TABLET BY MOUTH DAILY  . FLUBLOK QUADRIVALENT 0.5 ML injection   . insulin NPH Human (NOVOLIN N) 100 UNIT/ML  injection INJECT 45 - 55 UNITS UNDER THE SKIN BEFORE BREAKFAST AND DINNER  . insulin regular (NOVOLIN R) 100 units/mL injection INJECT 15-30 UNITS THREE TIMES DAILY BEFORE MEALS AS DIRECTED  . Lancets Misc. (ACCU-CHEK MULTICLIX LANCET DEV) KIT Use to check sugar 4 times daily  . lisinopril (ZESTRIL) 30 MG tablet TAKE ONE TABLET BY MOUTH DAILY  . metoprolol tartrate (LOPRESSOR) 50 MG tablet TAKE ONE TABLET BY MOUTH DAILY  . NEEDLE, DISP, 30 G 30G X 1" MISC by Does not apply route as directed.  [name] VERIO test strip USE TO TEST BLOOD SUGAR 4 TIMES DAILY AS NEEDED  . simvastatin (ZOCOR) 40 MG tablet TAKE ONE TABLET BY MOUTH AT BEDTIME   No facility-administered encounter medications on file as of 08/22/2020.    Allergies (verified) Patient has no allergy information on record.   History: Past Medical History:  Diagnosis Date  . CAD (coronary artery disease)   . Colon polyp   . DIABETES MELLITUS, TYPE II 11/03/2006  . HYPERLIPIDEMIA 11/03/2006  . HYPERTENSION 11/03/2006  . PAD (peripheral artery disease) ([place])    Past Surgical History:  Procedure Laterality Date  . ANGIOPLASTY  20 years ago   percutaneous transluminal   . CATARACT EXTRACTION  2002, 2005   bilateral   Family History  Problem Relation Age of Onset  . COPD Father   . Alcohol abuse Father   . Diabetes Father   . Cancer Sister        breast  . Stroke Mother   .  Colon cancer Neg Hx    Social History   Socioeconomic History  . Marital status: Married    Spouse name: Not on file  . Number of children: 2  . Years of education: Not on file  . Highest education level: Not on file  Occupational History  . Occupation: Retired-Truck Geophysicist/field seismologist  Tobacco Use  . Smoking status: Former Smoker    Types: Cigarettes    Quit date: 05/18/1991    Years since quitting: 29.2  . Smokeless tobacco: Never Used  Vaping Use  . Vaping Use: Never used  Substance and Sexual Activity  . Alcohol use: No  . Drug use: No  . Sexual  activity: Not on file  Other Topics Concern  . Not on file  Social History Narrative   Works 3rd shift   Regular Exercise-yes   Social Determinants of Radio broadcast assistant Strain: Low Risk   . Difficulty of Paying Living Expenses: Not hard at all  Food Insecurity: No Food Insecurity  . Worried About Charity fundraiser in the Last Year: Never true  . Ran Out of Food in the Last Year: Never true  Transportation Needs: No Transportation Needs  . Lack of Transportation (Medical): No  . Lack of Transportation (Non-Medical): No  Physical Activity: Not on file  Stress: Not on file  Social Connections: Not on file    Tobacco Counseling Counseling given: Not Answered   Clinical Intake:                 Diabetic?yes Nutrition Risk Assessment:  Has the patient had any N/V/D within the last 2 months?  {YES/NO:21197} Does the patient have any non-healing wounds?  {YES/NO:21197} Has the patient had any unintentional weight loss or weight gain?  {YES/NO:21197}  Diabetes:  Is the patient diabetic?  {YES/NO:21197} If diabetic, was a CBG obtained today?  {YES/NO:21197} Did the patient bring in their glucometer from home?  {YES/NO:21197} How often do you monitor your CBG's? ***.   Financial Strains and Diabetes Management:  Are you having any financial strains with the device, your supplies or your medication? {YES/NO:21197}.  Does the patient want to be seen by Chronic Care Management for management of their diabetes?  {YES/NO:21197} Would the patient like to be referred to a Nutritionist or for Diabetic Management?  {YES/NO:21197}  Diabetic Exams:  {Diabetic Eye Exam:2101801} {Diabetic Foot Exam:2101802}        Activities of Daily Living In your present state of health, do you have any difficulty performing the following activities: 01/07/2020  Hearing? N  Vision? N  Difficulty concentrating or making decisions? N  Walking or climbing stairs? N  Dressing  or bathing? N  Doing errands, shopping? N  Preparing Food and eating ? N  Using the Toilet? N  Managing your Medications? N  Managing your Finances? N  Housekeeping or managing your Housekeeping? N  Some recent data might be hidden    Patient Care Team: [name], NP as PCP - General (Family Medicine) Burnell Blanks, MD as PCP - Cardiology (Cardiology) Earnie Larsson, [facility] as Pharmacist (Pharmacist)  Indicate any recent Medical Services you may have received from other than [facility] providers in the past year (date may be approximate).     Assessment:   This is a routine wellness examination for Asif.  Hearing/Vision screen No exam data present  Dietary issues and exercise activities discussed:    Goals    . Pharmacy Care Plan  CARE PLAN ENTRY  Current Barriers:  . Chronic Disease Management support, education, and care coordination needs related to CAD, HTN, HLD, DMII, and low back pain  Pharmacist Clinical Goal(s):  Marland Kitchen Diabetes  . Achieve A1c <7.0% or 8.0% (based on Dr. Renne Crigler) . Maintain up to date on diabetes preventative health (eye exams every 1 to 2 years and foot exams at least once yearly).  . Blood pressure . Maintain blood pressure within goal of your provider (130/80 or 140/90)  . Maintain low salt diet.  . High cholesterol:  . Cholesterol goals: Total Cholesterol goal under 200, Triglycerides goal under 150, HDL goal above 40 (men) or above 50 (women), LDL goal under 70.  Interventions: . Comprehensive medication review performed. . Diabetes . Discussed importance of diabetes preventative health exams. . Obtain diabetic eye exam every 1 to 2 years.  . Obtain at least once yearly foot exams. . Continue:  - insulin NPH, inject 45 units before breakfast, 55 units at bedtime - insulin R, inject 30 units before breakfast, 10-15 units before lunch, and 30-35 units before dinner - If snacking, inject 5-10 units of regular insulin  (R). . Blood pressure:  . DASH diet:  following a diet emphasizing fruits and vegetables and low-fat dairy products along with whole grains, fish, poultry, and nuts. Reducing red meats and sugars. (patient eats: 3 meals/day and tries to incorporate vegetables).  . Reducing the amount of salt intake to <1553m/per day.  . Recommend using a salt substitute to replace your salt if you need flavor.    . Weight reduction- We discussed losing 5-10% of body weight. . Continue . amlodipine 117m 1 tablet daily . lisinopril 3065m1 tablet daily . metoprolol tartrate 54m39m tablet daily . Cholesterol . How to reduce cholesterol through diet/weight management and physical activity.    . We discussed how a diet high in plant sterols (fruits/vegetables/nuts/whole grains/legumes) may reduce your cholesterol.  Encouraged increasing fiber to a daily intake of 10-25g/day.  . Continue:  . simvastatin 40mg88mtablet at bedtime  . fenofibrate 145mg,34mablet daily  Patient Self Care Activities:  . Self administers medications as prescribed and Calls provider office for new concerns or questions . Continue current medications as directed by providers.  . Continue following up with specialists. . Continue at home blood pressure readings. . Continue checking blood sugars.  . Continue working on health habits (diet/ exercise).  Please see past updates related to this goal by clicking on the "Past Updates" button in the selected goal        Depression Screen PHQ 2/9 Scores 06/11/2020 06/05/2019 11/02/2018 03/09/2018 03/05/2016 06/13/2015 03/12/2015  PHQ - 2 Score 0 0 0 0 0 0 0    Fall Risk Fall Risk  06/11/2020 06/05/2019 03/09/2018 03/05/2016 03/12/2015  Falls in the past year? 1 1 No No No  Number falls in past yr: 1 0 - - -  Injury with Fall? 0 0 - - -    FALL RISK PREVENTION PERTAINING TO THE HOME:  Any stairs in or around the home? {YES/NO:21197} If so, are there any without handrails? Yes  Home  free of loose throw rugs in walkways, pet beds, electrical cords, etc? Yes  Adequate lighting in your home to reduce risk of falls? Yes   ASSISTIVE DEVICES UTILIZED TO PREVENT FALLS:  Life alert? {YES/NO:21197} Use of a cane, walker or w/c? {YES/NO:21197} Grab bars in the bathroom? {YES/NO:21197} Shower chair or bench in shower? {YES/NO:21197}  Elevated toilet seat or a handicapped toilet? {YES/NO:21197}  Cognitive Function:   Normal cognitive status assessed by direct observation by this Nurse Health Advisor. No abnormalities found.         Immunizations Immunization History  Administered Date(s) Administered  . Influenza Split 02/08/2012, 03/06/2013  . Influenza Whole 02/14/2009, 02/14/2010  . Influenza, High Dose Seasonal PF 02/15/2018  . Influenza, Quadrivalent, Recombinant, Inj, Pf 01/24/2019  . Influenza,inj,Quad PF,6+ Mos 01/18/2014  . Influenza-Unspecified 02/26/2015  . PFIZER Comirnaty(Gray Top)Covid-19 Tri-Sucrose Vaccine 06/06/2019, 06/27/2019, 05/16/2020  . Pneumococcal Conjugate-13 01/18/2014  . Pneumococcal Polysaccharide-23 05/17/2000, 12/27/2008  . Td 05/18/1995  . Tdap 10/15/2010    TDAP status: Up to date  Flu Vaccine status: Up to date  Pneumococcal vaccine status: Up to date  Covid-19 vaccine status: Completed vaccines  Qualifies for Shingles Vaccine? Yes   Zostavax completed No   Shingrix Completed?: No.    Education has been provided regarding the importance of this vaccine. Patient has been advised to call insurance company to determine out of pocket expense if they have not yet received this vaccine. Advised may also receive vaccine at local pharmacy or Health Dept. Verbalized acceptance and understanding.  Screening Tests Health Maintenance  Topic Date Due  . Hepatitis C Screening  Never done  . FOOT EXAM  03/11/2018  . COLONOSCOPY (Pts 45-41yr Insurance coverage will need to be confirmed)  09/27/2018  . TETANUS/TDAP  10/14/2020  .  HEMOGLOBIN A1C  10/16/2020  . INFLUENZA VACCINE  12/15/2020  . OPHTHALMOLOGY EXAM  12/26/2020  . COVID-19 Vaccine  Completed  . PNA vac Low Risk Adult  Completed  . HPV VACCINES  Aged Out    Health Maintenance  Health Maintenance Due  Topic Date Due  . Hepatitis C Screening  Never done  . FOOT EXAM  03/11/2018  . COLONOSCOPY (Pts 45-478yrInsurance coverage will need to be confirmed)  09/27/2018    Colorectal cancer screening: Referral to GI placed 08/22/2020. Pt aware the office will call re: appt.  Lung Cancer Screening: (Low Dose CT Chest recommended if Age 78-80ears, 30 pack-year currently smoking OR have quit w/in 15years.) does not qualify.   Lung Cancer Screening Referral: n/a  Additional Screening:  Hepatitis C Screening: does qualify  Vision Screening: Recommended annual ophthalmology exams for early detection of glaucoma and other disorders of the eye. Is the patient up to date with their annual eye exam?  {YES/NO:21197} Who is the provider or what is the name of the office in which the patient attends annual eye exams? *** If pt is not established with a provider, would they like to be referred to a provider to establish care? {YES/NO:21197}.   Dental Screening: Recommended annual dental exams for proper oral hygiene  Community Resource Referral / Chronic Care Management: CRR required this visit?  No   CCM required this visit?  No      Plan:     I have personally reviewed and noted the following in the patient's chart:   . Medical and social history . Use of alcohol, tobacco or illicit drugs  . Current medications and supplements . Functional ability and status . Nutritional status . Physical activity . Advanced directives . List of other physicians . Hospitalizations, surgeries, and ER visits in previous 12 months . Vitals . Screenings to include cognitive, depression, and falls . Referrals and appointments  In addition, I have reviewed and  discussed with patient certain preventive protocols, quality metrics, and best practice  recommendations. A written personalized care plan for preventive services as well as general preventive health recommendations were provided to patient.     Randel Pigg, LPN   12/19/4881   Nurse Notes:none

## 2020-08-22 ENCOUNTER — Other Ambulatory Visit (HOSPITAL_COMMUNITY): Payer: Self-pay

## 2020-08-22 ENCOUNTER — Telehealth: Payer: Self-pay

## 2020-08-22 ENCOUNTER — Ambulatory Visit: Payer: HMO

## 2020-08-22 DIAGNOSIS — Z0000 Encounter for general adult medical examination without abnormal findings: Secondary | ICD-10-CM

## 2020-08-22 DIAGNOSIS — Z1211 Encounter for screening for malignant neoplasm of colon: Secondary | ICD-10-CM

## 2020-08-22 NOTE — Telephone Encounter (Signed)
Called patient for AWV , patient declines telephone visit states we call to often and he has seen his PCP [name] and does not want to complete the visit.

## 2020-08-26 ENCOUNTER — Telehealth: Payer: Self-pay

## 2020-08-26 ENCOUNTER — Encounter: Payer: Self-pay | Admitting: Internal Medicine

## 2020-08-26 ENCOUNTER — Ambulatory Visit: Payer: HMO | Admitting: Internal Medicine

## 2020-08-26 ENCOUNTER — Other Ambulatory Visit: Payer: Self-pay

## 2020-08-26 VITALS — BP 128/72 | HR 67 | Ht 68.0 in | Wt 227.6 lb

## 2020-08-26 DIAGNOSIS — E669 Obesity, unspecified: Secondary | ICD-10-CM

## 2020-08-26 DIAGNOSIS — IMO0002 Reserved for concepts with insufficient information to code with codable children: Secondary | ICD-10-CM

## 2020-08-26 DIAGNOSIS — E1159 Type 2 diabetes mellitus with other circulatory complications: Secondary | ICD-10-CM | POA: Diagnosis not present

## 2020-08-26 DIAGNOSIS — E785 Hyperlipidemia, unspecified: Secondary | ICD-10-CM | POA: Diagnosis not present

## 2020-08-26 DIAGNOSIS — Z794 Long term (current) use of insulin: Secondary | ICD-10-CM

## 2020-08-26 DIAGNOSIS — R7989 Other specified abnormal findings of blood chemistry: Secondary | ICD-10-CM | POA: Diagnosis not present

## 2020-08-26 DIAGNOSIS — E1165 Type 2 diabetes mellitus with hyperglycemia: Secondary | ICD-10-CM

## 2020-08-26 DIAGNOSIS — Z6834 Body mass index (BMI) 34.0-34.9, adult: Secondary | ICD-10-CM | POA: Diagnosis not present

## 2020-08-26 LAB — POCT GLYCOSYLATED HEMOGLOBIN (HGB A1C): Hemoglobin A1C: 8.3 % — AB (ref 4.0–5.6)

## 2020-08-26 LAB — T4, FREE: Free T4: 0.71 ng/dL (ref 0.60–1.60)

## 2020-08-26 LAB — T3, FREE: T3, Free: 2.8 pg/mL (ref 2.3–4.2)

## 2020-08-26 LAB — TSH: TSH: 3.67 u[IU]/mL (ref 0.35–4.50)

## 2020-08-26 NOTE — Patient Instructions (Addendum)
Please continue: Insulin Before breakfast Before lunch Before dinner Bedtime  Regular 30-35 units (when you are active, use 20-25 units) 15-20 units 30-35 units -  NPH 45 >> 40 units   55 units   If you do have a snack at night, may need to cover this with 5-10 units of regular insulin (R).  The most common suppliers for the continuous glucose monitor ([employer] [place]) are:  [employer] [phone], extension [phone]  -[employer] [phone]  [employer] [place] [phone]    Please come back for a follow-up appointment in 4 months.

## 2020-08-26 NOTE — Progress Notes (Addendum)
Patient ID: Eric Choi, male   DOB: [birth year], 80 y.o.   MRN: [id number]  This visit occurred during the SARS-CoV-2 public health emergency.  Safety protocols were in place, including screening questions prior to the visit, additional usage of staff PPE, and extensive cleaning of exam room while observing appropriate contact time as indicated for disinfecting solutions.  HPI: Eric Choi is a 80 y.o.-year-old male, returning for f/u for DM2 dx 1990, insulin-dependent since 2005, uncontrolled, with complications (CAD, CKD, PN, DR, PVD). Last visit was 4 months ago.  Interim history: He had constipated last week >> nausea >> took medicines >> sugars higher with these episodes. He otherwise feels well, without complaints.  Reviewed HbA1c levels: Lab Results  Component Value Date   HGBA1C 8.4 (A) 04/17/2020   HGBA1C 8.2 (A) 11/29/2019   HGBA1C 7.6 (A) 07/24/2019   He is on: Insulin Before breakfast Before lunch Before dinner Bedtime  Regular 30-35 units (but 20-25 units in more active day)  15-20 units 35 units -  NPH 45 units   55 units    - Ozempic 0.5 mg weekly -restarted 11/2018 >> 1 mg weekly - Pt assistance pgm >> now off - he could not get it...  We tried U500 insulin but this was too expensive. He did not start Trulicity due to price - 145$.  Pt checks his sugars 3-4 times a day per review of his meter downloads: - am:   106-206 >> 103-203, 248 >> 77-170, 223 >> 52 x1, 96-246 - 1-2h after b'fast:  65 >> 165 >> 182, 251 >> n/c >> 264, 444 >> 90-200 - before lunch: 85-190, 255, 280 >> 58, 60-122, 224 >> 56, 65, 93-105 - 2h after lunch:234 >> 136-188 >> n/c >> n/c >> 266 >> 203 - before dinner: 116-169 >> 123, 141, 229 >> 51, 160-195, 313 >> 110-172 - 1-2h after dinner: 162 >> n/c >> 150-211 >> 139, 194 >> n/c - bedtime:  n/c >> 187 >> 131 >> 72-140, 190 >> n/c >> 60-204 - nighttime: 105-234 >> 175-229 >> n/c >> 68, 77-232 >> 122, 144, 155 Lowest CBG: 50s >> 72 >> 85 >> 51  >> 52; he has hypoglycemia awareness in the 90s.. Highest CBG: 324 >>... 200 >> 206 >> 280 >> 444 >> 300 x1.  Pt's meals are: - Breakfast: cereal + milk + eggs + bacon/sausage - Lunch: egg sandwich - Dinner: meat + veggies + starch - Snacks: 1-2: including after dinner; pears + mayonnaise, milk + crackers; cake  He drives a truck.  -+ CKD, last BUN/creatinine:  Lab Results  Component Value Date   BUN 35 (H) 06/11/2020   CREATININE 2.78 (H) 06/11/2020  On lisinopril. -+ HL; last set of lipids: Lab Results  Component Value Date   CHOL 171 06/11/2020   HDL 32.00 (L) 06/11/2020   LDLCALC 52 09/03/2019   LDLDIRECT 73.0 06/11/2020   TRIG (H) 06/11/2020    512.0 Triglyceride is over 400; calculations on Lipids are invalid.   CHOLHDL 5 06/11/2020  On Zocor 40, fenofibrate 145.   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 Dr. Radene Ou. - + numbness and tingling in his feet.  He also has HTN.  Latest TSH was slightly elevated: Lab Results  Component Value Date   TSH 4.52 (H) 06/11/2020   He lost his twin brother to cancer 05/2018.  ROS: Constitutional: no weight gain/+ weight loss, no fatigue, no subjective hyperthermia,  no subjective hypothermia Eyes: no blurry vision, no xerophthalmia ENT: no sore throat, no nodules palpated in neck, no dysphagia, no odynophagia, no hoarseness Cardiovascular: no CP/no SOB/no palpitations/no leg swelling Respiratory: no cough/no SOB/no wheezing Gastrointestinal: no N/no V/no D/no C/no acid reflux Musculoskeletal: no muscle aches/no joint aches Skin: no rashes, no hair loss Neurological: no tremors/+ numbness/+ tingling/no dizziness  I reviewed pt's medications, allergies, PMH, social hx, family hx, and changes were documented in the history of present illness. Otherwise, unchanged from my initial visit note.  Past Medical History:  Diagnosis Date  . CAD (coronary artery disease)   . Colon polyp   .  DIABETES MELLITUS, TYPE II 11/03/2006  . HYPERLIPIDEMIA 11/03/2006  . HYPERTENSION 11/03/2006  . PAD (peripheral artery disease) ([place])    Past Surgical History:  Procedure Laterality Date  . ANGIOPLASTY  20 years ago   percutaneous transluminal   . CATARACT EXTRACTION  2002, 2005   bilateral   Social History   Socioeconomic History  . Marital status: Married    Spouse name: Not on file  . Number of children: 2  . Years of education: Not on file  . Highest education level: Not on file  Occupational History  . Occupation: Retired-Truck Geophysicist/field seismologist  Tobacco Use  . Smoking status: Former Smoker    Types: Cigarettes    Quit date: 05/18/1991    Years since quitting: 29.2  . Smokeless tobacco: Never Used  Vaping Use  . Vaping Use: Never used  Substance and Sexual Activity  . Alcohol use: No  . Drug use: No  . Sexual activity: Not on file  Other Topics Concern  . Not on file  Social History Narrative   Works 3rd shift   Regular Exercise-yes   Social Determinants of Radio broadcast assistant Strain: Low Risk   . Difficulty of Paying Living Expenses: Not hard at all  Food Insecurity: No Food Insecurity  . Worried About Charity fundraiser in the Last Year: Never true  . Ran Out of Food in the Last Year: Never true  Transportation Needs: No Transportation Needs  . Lack of Transportation (Medical): No  . Lack of Transportation (Non-Medical): No  Physical Activity: Not on file  Stress: Not on file  Social Connections: Not on file  Intimate Partner Violence: Not on file   Current Outpatient Medications on File Prior to Visit  Medication Sig Dispense Refill  . amLODipine (NORVASC) 10 MG tablet TAKE ONE TABLET BY MOUTH DAILY 90 tablet 3  . aspirin 81 MG tablet Take 81 mg by mouth daily.    . Blood Glucose Monitoring Suppl (ONETOUCH VERIO) w/Device KIT 1 each by Does not apply route 4 (four) times daily. 1 kit 0  . ezetimibe (ZETIA) 10 MG tablet Take 1 tablet (10 mg total) by mouth  daily. 90 tablet 0  . fenofibrate (TRICOR) 145 MG tablet TAKE ONE TABLET BY MOUTH DAILY 90 tablet 0  . FLUBLOK QUADRIVALENT 0.5 ML injection     . insulin NPH Human (NOVOLIN N) 100 UNIT/ML injection INJECT 45 - 55 UNITS UNDER THE SKIN BEFORE BREAKFAST AND DINNER 30 mL 11  . insulin regular (NOVOLIN R) 100 units/mL injection INJECT 15-30 UNITS THREE TIMES DAILY BEFORE MEALS AS DIRECTED 30 mL 11  . Lancets Misc. (ACCU-CHEK MULTICLIX LANCET DEV) KIT Use to check sugar 4 times daily 400 each 5  . lisinopril (ZESTRIL) 30 MG tablet TAKE ONE TABLET BY MOUTH DAILY 90 tablet 3  .  metoprolol tartrate (LOPRESSOR) 50 MG tablet TAKE ONE TABLET BY MOUTH DAILY 90 tablet 3  . NEEDLE, DISP, 30 G 30G X 1" MISC by Does not apply route as directed.    [name] VERIO test strip USE TO TEST BLOOD SUGAR 4 TIMES DAILY AS NEEDED 400 strip 10  . simvastatin (ZOCOR) 40 MG tablet TAKE ONE TABLET BY MOUTH AT BEDTIME 90 tablet 3   No current facility-administered medications on file prior to visit.   Not on File Family History  Problem Relation Age of Onset  . COPD Father   . Alcohol abuse Father   . Diabetes Father   . Cancer Sister        breast  . Stroke Mother   . Colon cancer Neg Hx     PE: BP 128/72 (BP Location: Right Arm, Patient Position: Sitting, Cuff Size: Normal)   Pulse 67   Ht 5' 8"  (1.727 m)   Wt 227 lb 9.6 oz (103.2 kg)   SpO2 98%   BMI 34.61 kg/m  Body mass index is 34.61 kg/m.  Wt Readings from Last 3 Encounters:  08/26/20 227 lb 9.6 oz (103.2 kg)  06/11/20 233 lb (105.7 kg)  04/17/20 228 lb 9.6 oz (103.7 kg)   Constitutional: overweight, in NAD Eyes: PERRLA, EOMI, no exophthalmos ENT: moist mucous membranes, no thyromegaly, no cervical lymphadenopathy Cardiovascular: RRR, No MRG Respiratory: CTA B Gastrointestinal: abdomen soft, NT, ND, BS+ Musculoskeletal: no deformities, strength intact in all 4 Skin: moist, warm, no rashes Neurological: no tremor with outstretched hands,  DTR normal in all 4  ASSESSMENT: 1. DM2, uncontrolled, insulin-dep, with complications - CAD - CKD - PN - DR  - Tried to obtain U500 insulin but this was too expensive >> we had to go back to N and R insulin  2. HL  3.  Obesity class 1  4.  Elevated TSH  PLAN:  1. Patient with longstanding, uncontrolled, type 2 diabetes, on basal/bolus insulin regimen with NPH and regular insulin due to price.  He has been having problems with Ozempic, which he is getting through patient assistance program.  Sugars worsened after coming off Ozempic before last visit.  I advised him to call the program and see if he can start this back.  I recommended a higher dose, 1 mg weekly. However, he could not obtain Ozempic since last visit... -At last visit, sugars were higher but also more variable, varying from 51-444 per his meter download.  He was having dietary indiscretions and we discussed about limiting these.  I also advised him to use a lower dose of regular insulin before breakfast if he plans to be active during the day as he had lower blood sugars midday in the days that he was more active or going to the flea market. -At this visit, he tells me that he is still taking 35 units of regular insulin even when he plans to be active during the day and he still develops occasional lower blood sugars in the 60s or upper 50s midday.  I again advised him to decrease the regular dose whenever he goes to the free market or otherwise plans to stay active.  Moreover, I advised him to also decrease his NPH in the morning to avoid further risk of hypoglycemia.  The sugars are otherwise fluctuating during the day and they were higher last week when he was sick.  However, they improved this week so we will keep the rest of the regimen  the same. -He is interested in a CGM and I gave him a list of suppliers -I advised him to: Patient Instructions   Please continue: Insulin Before breakfast Before lunch Before dinner  Bedtime  Regular 30-35 units (when you are active, use 20-25 units) 15-20 units 30-35 units -  NPH 45 >> 40 units   55 units   If you do have a snack at night, may need to cover this with 5-10 units of regular insulin (R).  The most common suppliers for the continuous glucose monitor ([employer]) are:  [facility] [phone], extension [phone]  -[employer] [phone]  [employer] [facility] [place] [phone]    Please come back for a follow-up appointment in 4 months.   - we checked his HbA1c: 8.3% (stable) - advised to check sugars at different times of the day - 4x a day, rotating check times - advised for yearly eye exams >> he is UTD - return to clinic in 4 months   2. HL -Reviewed latest lipid panel from 05/2020: LDL slightly above goal, increase, triglycerides very high, HDL low: Lab Results  Component Value Date   CHOL 171 06/11/2020   HDL 32.00 (L) 06/11/2020   LDLCALC 52 09/03/2019   LDLDIRECT 73.0 06/11/2020   TRIG (H) 06/11/2020    512.0 Triglyceride is over 400; calculations on Lipids are invalid.   CHOLHDL 5 06/11/2020  -He continues on Zocor 40, fenofibrate 145.  He is seen in the lipid clinic.  3.  Obesity class 1 -Before the coronavirus pandemic, she was exercising at [employer], but afterwards he stopped exercising due to back pain with sciatica and leg weakness -At last visit, we discussed about improving diet.  Also, I recommended to check with the patient assistance program at [employer] to see if we can start Ozempic, 1 mg weekly. -He lost 6 pounds before last visit and 6 more since then  4.  Elevated TSH -Reviewing his labs from 05/2020, he had a slightly elevated TSH at that time -He denies symptoms of hypothyroidism -We will recheck his TFTs today.  Component     Latest Ref Rng & Units 08/26/2020  Hemoglobin A1C     4.0 - 5.6 % 8.3 (A)  TSH     0.35 - 4.50 uIU/mL 3.67   T4,Free(Direct)     0.60 - 1.60 ng/dL 0.71  Triiodothyronine,Free,Serum     2.3 - 4.2 pg/mL 2.8  TFTs are now normal.  Philemon Kingdom, MD PhD [facility]

## 2020-08-26 NOTE — Telephone Encounter (Addendum)
-----   Message from Philemon Kingdom, MD sent at 08/26/2020  1:20 PM EDT ----- Can you please call pt.:  His thyroid tests are now all normal.  Spoke with wife Izora Gala. She verbalized understanding

## 2020-09-05 ENCOUNTER — Encounter: Payer: Self-pay | Admitting: Adult Health

## 2020-09-05 ENCOUNTER — Ambulatory Visit (INDEPENDENT_AMBULATORY_CARE_PROVIDER_SITE_OTHER): Payer: HMO | Admitting: Adult Health

## 2020-09-05 ENCOUNTER — Other Ambulatory Visit: Payer: Self-pay

## 2020-09-05 VITALS — BP 140/70 | HR 69 | Temp 97.8°F | Wt 225.6 lb

## 2020-09-05 DIAGNOSIS — E782 Mixed hyperlipidemia: Secondary | ICD-10-CM | POA: Diagnosis not present

## 2020-09-05 DIAGNOSIS — N184 Chronic kidney disease, stage 4 (severe): Secondary | ICD-10-CM

## 2020-09-05 LAB — LIPID PANEL
Cholesterol: 128 mg/dL (ref 0–200)
HDL: 36.1 mg/dL — ABNORMAL LOW (ref >39.00)
NonHDL: 91.66
Total CHOL/HDL Ratio: 4
Triglycerides: 202 mg/dL — ABNORMAL HIGH (ref 0.0–149.0)
VLDL: 40.4 mg/dL — ABNORMAL HIGH (ref 0.0–40.0)

## 2020-09-05 LAB — BASIC METABOLIC PANEL
BUN: 32 mg/dL — ABNORMAL HIGH (ref 6–23)
CO2: 26 mEq/L (ref 19–32)
Calcium: 10 mg/dL (ref 8.4–10.5)
Chloride: 101 mEq/L (ref 96–112)
Creatinine, Ser: 2.7 mg/dL — ABNORMAL HIGH (ref 0.40–1.50)
GFR: 21.93 mL/min — ABNORMAL LOW (ref >60.00)
Glucose, Bld: 87 mg/dL (ref 70–99)
Potassium: 4.6 mEq/L (ref 3.5–5.1)
Sodium: 136 mEq/L (ref 135–145)

## 2020-09-05 LAB — LDL CHOLESTEROL, DIRECT: Direct LDL: 69 mg/dL

## 2020-09-05 NOTE — Progress Notes (Signed)
Subjective:    Patient ID: Eric Choi, male    DOB: June 29, 1940, 80 y.o.   MRN: [id number]  HPI  80 year old male who  has a past medical history of CAD (coronary artery disease), Colon polyp, DIABETES MELLITUS, TYPE II (11/03/2006), HYPERLIPIDEMIA (11/03/2006), HYPERTENSION (11/03/2006), and PAD (peripheral artery disease) ([place]).  Presents to the office today for 68-monthfollow-up regarding hyperlipidemia.  During his CPE on June 11, 2020 his triglyceride level went from 186 the year prior to 512.  There is a question on my behalf if he was taking his fenofibrate on a daily basis so he was reminded to do so, and Zetia 149m was also added to his regimen He reports that he has not had any sided effects of the medication and has been taking all of his cholesterol medication on a daily basis   Will also recheck kidney function today due to /o of CKD stage 4  Review of Systems See HPI   Past Medical History:  Diagnosis Date  . CAD (coronary artery disease)   . Colon polyp   . DIABETES MELLITUS, TYPE II 11/03/2006  . HYPERLIPIDEMIA 11/03/2006  . HYPERTENSION 11/03/2006  . PAD (peripheral artery disease) ([facility])     Social History   Socioeconomic History  . Marital status: Married    Spouse name: Not on file  . Number of children: 2  . Years of education: Not on file  . Highest education level: Not on file  Occupational History  . Occupation: Retired-Truck drGeophysicist/field seismologistTobacco Use  . Smoking status: Former Smoker    Types: Cigarettes    Quit date: 05/18/1991    Years since quitting: 29.3  . Smokeless tobacco: Never Used  Vaping Use  . Vaping Use: Never used  Substance and Sexual Activity  . Alcohol use: No  . Drug use: No  . Sexual activity: Not on file  Other Topics Concern  . Not on file  Social History Narrative   Works 3rd shift   Regular Exercise-yes   Social Determinants of HeRadio broadcast assistanttrain: Low Risk   . Difficulty of Paying Living Expenses: Not  hard at all  Food Insecurity: No Food Insecurity  . Worried About RuCharity fundraisern the Last Year: Never true  . Ran Out of Food in the Last Year: Never true  Transportation Needs: No Transportation Needs  . Lack of Transportation (Medical): No  . Lack of Transportation (Non-Medical): No  Physical Activity: Not on file  Stress: Not on file  Social Connections: Not on file  Intimate Partner Violence: Not on file    Past Surgical History:  Procedure Laterality Date  . ANGIOPLASTY  20 years ago   percutaneous transluminal   . CATARACT EXTRACTION  2002, 2005   bilateral    Family History  Problem Relation Age of Onset  . COPD Father   . Alcohol abuse Father   . Diabetes Father   . Cancer Sister        breast  . Stroke Mother   . Colon cancer Neg Hx     No Known Allergies  Current Outpatient Medications on File Prior to Visit  Medication Sig Dispense Refill  . amLODipine (NORVASC) 10 MG tablet TAKE ONE TABLET BY MOUTH DAILY 90 tablet 3  . aspirin 81 MG tablet Take 81 mg by mouth daily.    . Blood Glucose Monitoring Suppl (ONETOUCH VERIO) w/Device KIT 1 each by Does  not apply route 4 (four) times daily. 1 kit 0  . ezetimibe (ZETIA) 10 MG tablet Take 1 tablet (10 mg total) by mouth daily. 90 tablet 0  . fenofibrate (TRICOR) 145 MG tablet TAKE ONE TABLET BY MOUTH DAILY 90 tablet 0  . FLUBLOK QUADRIVALENT 0.5 ML injection     . insulin NPH Human (NOVOLIN N) 100 UNIT/ML injection INJECT 45 - 55 UNITS UNDER THE SKIN BEFORE BREAKFAST AND DINNER 30 mL 11  . insulin regular (NOVOLIN R) 100 units/mL injection INJECT 15-30 UNITS THREE TIMES DAILY BEFORE MEALS AS DIRECTED 30 mL 11  . Lancets Misc. (ACCU-CHEK MULTICLIX LANCET DEV) KIT Use to check sugar 4 times daily 400 each 5  . lisinopril (ZESTRIL) 30 MG tablet TAKE ONE TABLET BY MOUTH DAILY 90 tablet 3  . metoprolol tartrate (LOPRESSOR) 50 MG tablet TAKE ONE TABLET BY MOUTH DAILY 90 tablet 3  . NEEDLE, DISP, 30 G 30G X 1" MISC  by Does not apply route as directed.    [name] VERIO test strip USE TO TEST BLOOD SUGAR 4 TIMES DAILY AS NEEDED 400 strip 10  . simvastatin (ZOCOR) 40 MG tablet TAKE ONE TABLET BY MOUTH AT BEDTIME 90 tablet 3   No current facility-administered medications on file prior to visit.    BP 140/70 (BP Location: Left Arm, Patient Position: Sitting, Cuff Size: Large)   Pulse 69   Temp 97.8 F (36.6 C) (Oral)   Wt 225 lb 9.6 oz (102.3 kg)   SpO2 97%   BMI 34.30 kg/m       Objective:   Physical Exam Vitals and nursing note reviewed.  Constitutional:      Appearance: Normal appearance.  Cardiovascular:     Rate and Rhythm: Normal rate and regular rhythm.     Pulses: Normal pulses.     Heart sounds: Normal heart sounds.  Pulmonary:     Effort: Pulmonary effort is normal.     Breath sounds: Normal breath sounds.  Musculoskeletal:        General: Normal range of motion.  Skin:    General: Skin is warm.     Capillary Refill: Capillary refill takes less than 2 seconds.  Neurological:     Mental Status: He is alert.  Psychiatric:        Mood and Affect: Mood normal.        Behavior: Behavior normal.        Thought Content: Thought content normal.        Judgment: Judgment normal.       Assessment & Plan:  1. Mixed hyperlipidemia - Lipid panel; Future  2. Chronic kidney disease, stage 4 (severe) ([facility]CC) - Consider referral to Nephrology  - Basic Metabolic Panel; Future   [name], NP

## 2020-09-05 NOTE — Addendum Note (Signed)
Addended by: Janann Colonel on: 09/05/2020 08:10 AM   Modules accepted: Orders

## 2020-09-09 ENCOUNTER — Other Ambulatory Visit: Payer: Self-pay | Admitting: Adult Health

## 2020-09-09 DIAGNOSIS — IMO0002 Reserved for concepts with insufficient information to code with codable children: Secondary | ICD-10-CM

## 2020-09-09 DIAGNOSIS — E1159 Type 2 diabetes mellitus with other circulatory complications: Secondary | ICD-10-CM

## 2020-09-09 DIAGNOSIS — N289 Disorder of kidney and ureter, unspecified: Secondary | ICD-10-CM

## 2020-09-15 ENCOUNTER — Encounter: Payer: Self-pay | Admitting: Cardiovascular Disease

## 2020-09-15 ENCOUNTER — Other Ambulatory Visit: Payer: Self-pay

## 2020-09-15 ENCOUNTER — Ambulatory Visit (INDEPENDENT_AMBULATORY_CARE_PROVIDER_SITE_OTHER): Payer: HMO | Admitting: Cardiovascular Disease

## 2020-09-15 VITALS — BP 136/62 | HR 70 | Ht 68.0 in | Wt 228.4 lb

## 2020-09-15 DIAGNOSIS — I2510 Atherosclerotic heart disease of native coronary artery without angina pectoris: Secondary | ICD-10-CM

## 2020-09-15 DIAGNOSIS — E7800 Pure hypercholesterolemia, unspecified: Secondary | ICD-10-CM | POA: Diagnosis not present

## 2020-09-15 DIAGNOSIS — I10 Essential (primary) hypertension: Secondary | ICD-10-CM | POA: Diagnosis not present

## 2020-09-15 NOTE — Progress Notes (Signed)
Chief Complaint  Patient presents with  . Follow-up    CAD    History of Present Illness: 80 yo male with history of CAD, DM, CKD, HTN, former tobacco abuse, PAD and hyperlipidemia who is here today for cardiac follow up. I saw him as a new consult September 2021 for the evaluation of dyspnea on exertion. He is followed in the [facility] by Dr. Trula Slade for PAD with no prior lower extremity procedures. 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 He had followed in the past with Dr. Olevia Perches. He described dyspnea on exertion for several years with no associated chest pain, LE edema, dizziness, near syncope or palpitations. Former smoker but stopped in 1993. Echo 02/06/20 with LVEF=50%, mild LVH, grade 1 diastolic dysfunction. No significant valve disease. Nuclear stress test 02/06/20 with no evidence of ischemia.   He is here today for follow up. The patient denies any chest pain, palpitations, lower extremity edema, orthopnea, PND, dizziness, near syncope or syncope. He continues to have dyspnea with exertion. No change over the past few years. He works at [employer] and has little limitation in his ability to work hard during the day. He has a pending referral to Nephrology for evaluation.   Primary Care Physician: [name], NP  Past Medical History:  Diagnosis Date  . CAD (coronary artery disease)   . Colon polyp   . DIABETES MELLITUS, TYPE II 11/03/2006  . HYPERLIPIDEMIA 11/03/2006  . HYPERTENSION 11/03/2006  . PAD (peripheral artery disease) ([facility])     Past Surgical History:  Procedure Laterality Date  . ANGIOPLASTY  20 years ago   percutaneous transluminal   . CATARACT EXTRACTION  2002, 2005   bilateral    Current Outpatient Medications  Medication Sig Dispense Refill  . amLODipine (NORVASC) 10 MG tablet TAKE ONE TABLET BY MOUTH DAILY 90 tablet 3  . aspirin 81 MG tablet Take 81 mg by mouth daily.    . Blood Glucose  Monitoring Suppl (ONETOUCH VERIO) w/Device KIT 1 each by Does not apply route 4 (four) times daily. 1 kit 0  . ezetimibe (ZETIA) 10 MG tablet TAKE ONE TABLET BY MOUTH DAILY 90 tablet 1  . fenofibrate (TRICOR) 145 MG tablet TAKE ONE TABLET BY MOUTH DAILY 90 tablet 0  . FLUBLOK QUADRIVALENT 0.5 ML injection     . insulin NPH Human (NOVOLIN N) 100 UNIT/ML injection INJECT 45 - 55 UNITS UNDER THE SKIN BEFORE BREAKFAST AND DINNER 30 mL 11  . insulin regular (NOVOLIN R) 100 units/mL injection INJECT 15-30 UNITS THREE TIMES DAILY BEFORE MEALS AS DIRECTED 30 mL 11  . Lancets Misc. (ACCU-CHEK MULTICLIX LANCET DEV) KIT Use to check sugar 4 times daily 400 each 5  . lisinopril (ZESTRIL) 30 MG tablet TAKE ONE TABLET BY MOUTH DAILY 90 tablet 3  . metoprolol tartrate (LOPRESSOR) 50 MG tablet TAKE ONE TABLET BY MOUTH DAILY 90 tablet 3  . NEEDLE, DISP, 30 G 30G X 1" MISC by Does not apply route as directed.    [name] VERIO test strip USE TO TEST BLOOD SUGAR 4 TIMES DAILY AS NEEDED 400 strip 10  . simvastatin (ZOCOR) 40 MG tablet TAKE ONE TABLET BY MOUTH AT BEDTIME 90 tablet 3   No current facility-administered medications for this visit.    No Known Allergies  Social History   Socioeconomic History  . Marital status: Married    Spouse name: Not on file  .  Number of children: 2  . Years of education: Not on file  . Highest education level: Not on file  Occupational History  . Occupation: Retired-Truck Geophysicist/field seismologist  Tobacco Use  . Smoking status: Former Smoker    Types: Cigarettes    Quit date: 05/18/1991    Years since quitting: 29.3  . Smokeless tobacco: Never Used  Vaping Use  . Vaping Use: Never used  Substance and Sexual Activity  . Alcohol use: No  . Drug use: No  . Sexual activity: Not on file  Other Topics Concern  . Not on file  Social History Narrative   Works 3rd shift   Regular Exercise-yes   Social Determinants of Radio broadcast assistant Strain: Low Risk   . Difficulty of  Paying Living Expenses: Not hard at all  Food Insecurity: No Food Insecurity  . Worried About Charity fundraiser in the Last Year: Never true  . Ran Out of Food in the Last Year: Never true  Transportation Needs: No Transportation Needs  . Lack of Transportation (Medical): No  . Lack of Transportation (Non-Medical): No  Physical Activity: Not on file  Stress: Not on file  Social Connections: Not on file  Intimate Partner Violence: Not on file    Family History  Problem Relation Age of Onset  . COPD Father   . Alcohol abuse Father   . Diabetes Father   . Cancer Sister        breast  . Stroke Mother   . Colon cancer Neg Hx     Review of Systems:  As stated in the HPI and otherwise negative.   BP 136/62   Pulse 70   Ht _0  (1.727 m)   Wt 228 lb 6.4 oz (103.6 kg)   SpO2 97%   BMI 34.73 kg/m   Physical Examination: General: Well developed, well nourished, NAD  HEENT: OP clear, mucus membranes moist  SKIN: warm, dry. No rashes. Neuro: No focal deficits  Musculoskeletal: Muscle strength 5/5 all ext  Psychiatric: Mood and affect normal  Neck: No JVD, no carotid bruits, no thyromegaly, no lymphadenopathy.  Lungs:Clear bilaterally, no wheezes, rhonci, crackles Cardiovascular: Regular rate and rhythm. No murmurs, gallops or rubs. Abdomen:Soft. Bowel sounds present. Non-tender.  Extremities: No lower extremity edema. Pulses are 2 + in the bilateral DP/PT.  EKG:  EKG is  ordered today. The ekg ordered today demonstrates   Echo 02/06/20:  1. Left ventricular ejection fraction, by estimation, is 50%. The left  ventricle has mildly decreased function. The left ventricle demonstrates  regional wall motion abnormalities with mid-apical inferoseptal and  anteroseptal mild hypokinesis. There is  mild left ventricular hypertrophy. Left ventricular diastolic parameters  are consistent with Grade I diastolic dysfunction (impaired relaxation).  2. Right ventricular systolic  function is normal. The right ventricular  size is normal. Tricuspid regurgitation signal is inadequate for assessing  PA pressure.  3. The mitral valve is normal in structure. No evidence of mitral valve  regurgitation. No evidence of mitral stenosis.  4. The aortic valve is tricuspid. Aortic valve regurgitation is not  visualized. Mild aortic valve sclerosis is present, with no evidence of  aortic valve stenosis.  5. IVC not visualized.   Recent Labs: 06/11/2020: ALT 18; Hemoglobin 12.9; Platelets 356.0 08/26/2020: TSH 3.67 09/05/2020: BUN 32; Creatinine, Ser 2.70; Potassium 4.6; Sodium 136   Lipid Panel    Component Value Date/Time   CHOL 128 09/05/2020 0800   TRIG 202.0 (H)  09/05/2020 0800   HDL 36.10 (L) 09/05/2020 0800   CHOLHDL 4 09/05/2020 0800   VLDL 40.4 (H) 09/05/2020 0800   LDLCALC 52 09/03/2019 0735   LDLDIRECT 69.0 09/05/2020 0800     Wt Readings from Last 3 Encounters:  09/15/20 228 lb 6.4 oz (103.6 kg)  09/05/20 225 lb 9.6 oz (102.3 kg)  08/26/20 227 lb 9.6 oz (103.2 kg)     Other studies Reviewed: Additional studies/ records that were reviewed today include: office notes Review of the above records demonstrates:    Assessment and Plan:   1. CAD without angina: He is known to have CAD by cath in 1994. No cath since then. Nuclear stress test in September 2021 with no ischemia. Echo September 2021 with LVEF=50%. No change in baseline dyspnea. Continue ASA, beta blocker and statin. We discussed a cardiac cath if his dyspnea worsens but given his CKD, this would be high risk for worsening his renal function.   2. PAD: Followed in [facility]. 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  3. HTN: BP is controlled. No changes today  4. Hyperlipidemia: LDL at goal April 2022. Continue statin.   Current medicines are reviewed at length with the patient today.  The patient does not have concerns regarding medicines.  The following changes have been  made:  no change  Labs/ tests ordered today include:   No orders of the defined types were placed in this encounter.    Disposition:   F/U with me in 3-4 months   Signed, Lauree Chandler, MD 09/15/2020 8:54 AM    [facility] [address], [place], [place]  [address] Phone: [phone]; Fax: [phone]

## 2020-09-15 NOTE — Patient Instructions (Signed)
Medication Instructions:  Your physician recommends that you continue on your current medications as directed. Please refer to the Current Medication list given to you today.  *If you need a refill on your cardiac medications before your next appointment, please call your pharmacy*   Lab Work: None If you have labs (blood work) drawn today and your tests are completely normal, you will receive your results only by: Marland Kitchen MyChart Message (if you have MyChart) OR . A paper copy in the mail If you have any lab test that is abnormal or we need to change your treatment, we will call you to review the results.   Testing/Procedures: None   Follow-Up: At [facility], you and your health needs are our priority.  As part of our continuing mission to provide you with exceptional heart care, we have created designated Provider Care Teams.  These Care Teams include your primary Cardiologist (physician) and Advanced Practice Providers (APPs -  Physician Assistants and Nurse Practitioners) who all work together to provide you with the care you need, when you need it.  We recommend signing up for the patient portal called "MyChart".  Sign up information is provided on this After Visit Summary.  MyChart is used to connect with patients for Virtual Visits (Telemedicine).  Patients are able to view lab/test results, encounter notes, upcoming appointments, etc.  Non-urgent messages can be sent to your provider as well.   To learn more about what you can do with MyChart, go to [email or web address].    Your next appointment:   3-4 month(s)  The format for your next appointment:   In Person  Provider:   You may see Lauree Chandler, MD or one of the following Advanced Practice Providers on your designated Care Team:    Melina Copa, PA-C  Ermalinda Barrios, PA-C    Other Instructions

## 2020-09-18 DIAGNOSIS — E785 Hyperlipidemia, unspecified: Secondary | ICD-10-CM | POA: Diagnosis not present

## 2020-09-18 DIAGNOSIS — I129 Hypertensive chronic kidney disease with stage 1 through stage 4 chronic kidney disease, or unspecified chronic kidney disease: Secondary | ICD-10-CM | POA: Diagnosis not present

## 2020-09-18 DIAGNOSIS — N2581 Secondary hyperparathyroidism of renal origin: Secondary | ICD-10-CM | POA: Diagnosis not present

## 2020-09-18 DIAGNOSIS — I739 Peripheral vascular disease, unspecified: Secondary | ICD-10-CM | POA: Diagnosis not present

## 2020-09-18 DIAGNOSIS — N189 Chronic kidney disease, unspecified: Secondary | ICD-10-CM | POA: Diagnosis not present

## 2020-09-18 DIAGNOSIS — N184 Chronic kidney disease, stage 4 (severe): Secondary | ICD-10-CM | POA: Diagnosis not present

## 2020-09-18 DIAGNOSIS — D631 Anemia in chronic kidney disease: Secondary | ICD-10-CM | POA: Diagnosis not present

## 2020-09-19 ENCOUNTER — Other Ambulatory Visit: Payer: Self-pay | Admitting: Nephrology

## 2020-09-19 DIAGNOSIS — N184 Chronic kidney disease, stage 4 (severe): Secondary | ICD-10-CM

## 2020-09-23 ENCOUNTER — Other Ambulatory Visit (HOSPITAL_COMMUNITY): Payer: Self-pay

## 2020-09-23 MED FILL — Insulin NPH (Human) (Isophane) Inj 100 Unit/ML: SUBCUTANEOUS | 27 days supply | Qty: 30 | Fill #1 | Status: AC

## 2020-09-23 MED FILL — Insulin Regular (Human) Inj 100 Unit/ML: INTRAMUSCULAR | 33 days supply | Qty: 30 | Fill #1 | Status: AC

## 2020-09-24 ENCOUNTER — Other Ambulatory Visit (HOSPITAL_COMMUNITY): Payer: Self-pay

## 2020-09-25 ENCOUNTER — Other Ambulatory Visit: Payer: Self-pay

## 2020-09-25 DIAGNOSIS — I739 Peripheral vascular disease, unspecified: Secondary | ICD-10-CM

## 2020-10-01 ENCOUNTER — Ambulatory Visit
Admission: RE | Admit: 2020-10-01 | Discharge: 2020-10-01 | Disposition: A | Discharge status: Home / Self Care | Payer: HMO | Source: Ambulatory Visit | Attending: Nephrology | Admitting: Nephrology

## 2020-10-01 DIAGNOSIS — N281 Cyst of kidney, acquired: Secondary | ICD-10-CM | POA: Diagnosis not present

## 2020-10-01 DIAGNOSIS — N184 Chronic kidney disease, stage 4 (severe): Secondary | ICD-10-CM

## 2020-10-20 ENCOUNTER — Telehealth: Payer: Self-pay | Admitting: Adult Health

## 2020-10-20 NOTE — Telephone Encounter (Signed)
Pt insurance no longer covers this Rx. Insurance does cover GEMFIBROZIL 600 MG PO TABS ok to change?

## 2020-10-21 NOTE — Telephone Encounter (Signed)
Noted  

## 2020-10-24 IMAGING — MR MR LUMBAR SPINE W/O CM
4 of 5 series · 18 of 48 positions shown · non-contrast
Comparison: None.

CLINICAL DATA: Low back pain for over 6 weeks

EXAM:
MRI LUMBAR SPINE WITHOUT CONTRAST
TECHNIQUE: Multiplanar, multisequence MR imaging of the lumbar spine was
performed. No intravenous contrast was administered.

[Series 5: T2 · sagittal · 4.0mm · 0.73mm/px · 6 of 15 slices shown (1 of 2)]
[im 1/15]
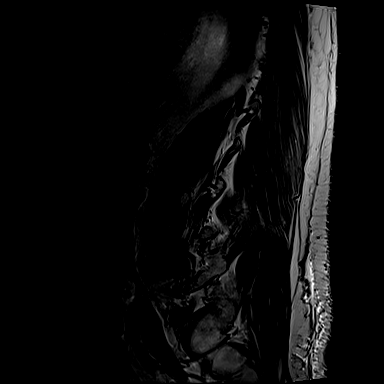
[im 3/15]
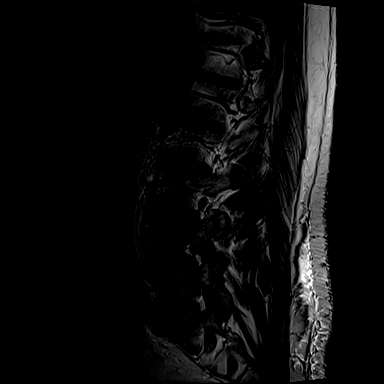
[im 6/15]
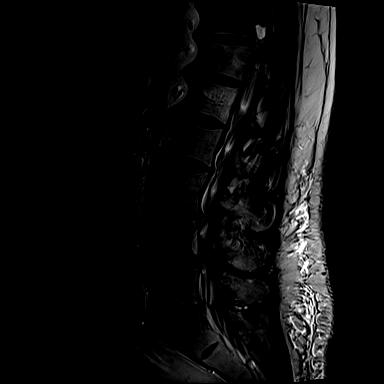
[im 9/15]
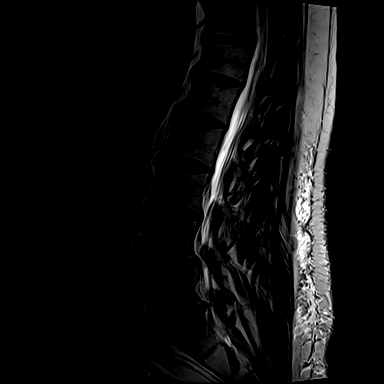
[im 12/15]
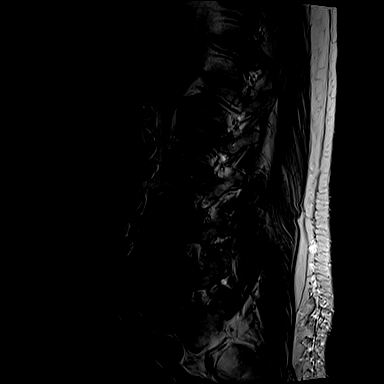
[im 15/15]
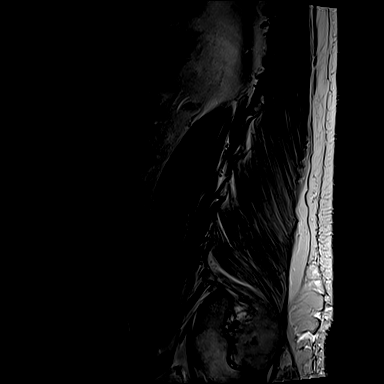

[Series 6: T1 · sagittal · 4.0mm · 0.73mm/px · 3 of 15 slices shown (1 of 2)]
[im 3/15]
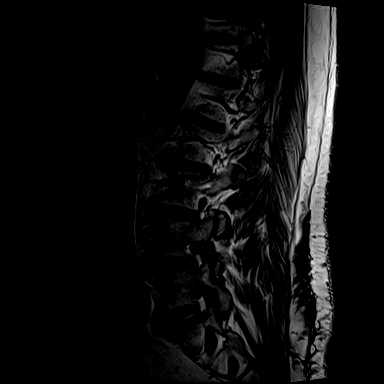
[im 9/15]
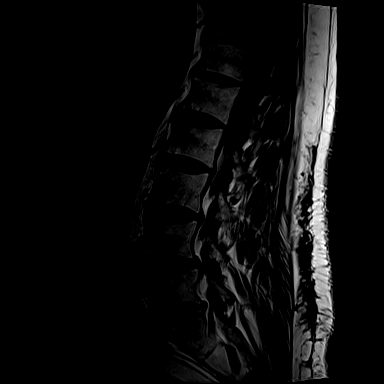
[im 15/15]
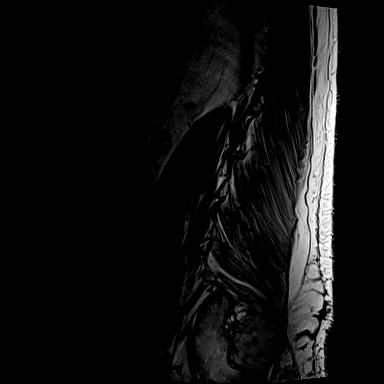

[Series 10: T1 · axial · 4.0mm · 0.28mm/px · z∈[-28,+139]mm · 3 of 41 slices shown (2 of 2)]
[im 6/41]
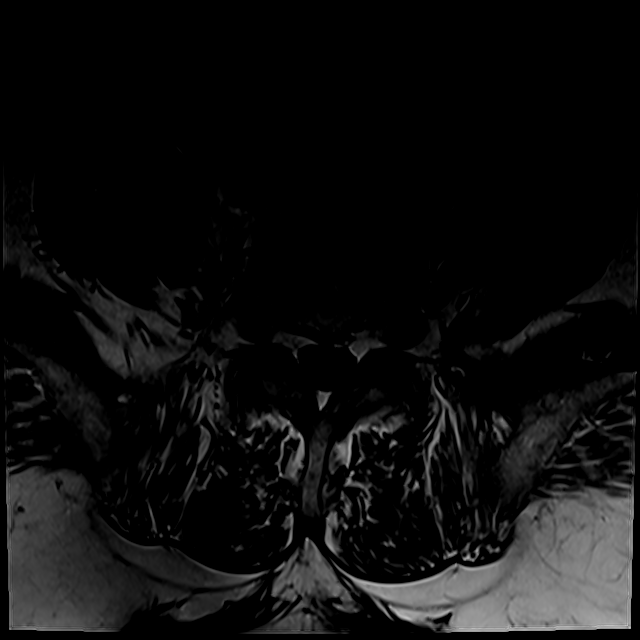
[im 21/41]
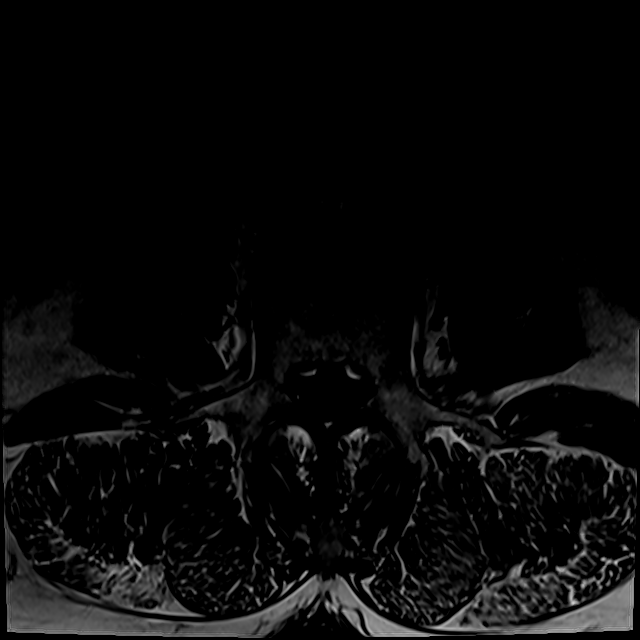
[im 35/41]
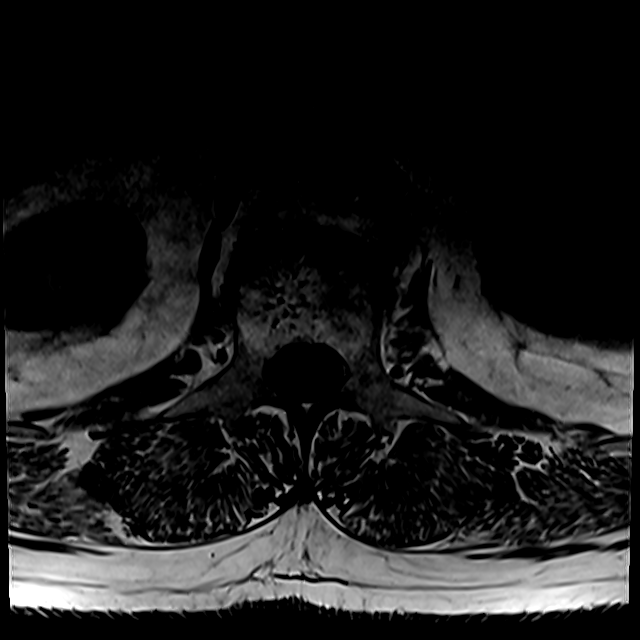

[Series 13: T2 · axial · 4.0mm · 0.28mm/px · z∈[-53,+139]mm · 6 of 41 slices shown (2 of 2)]
[im 1/41]
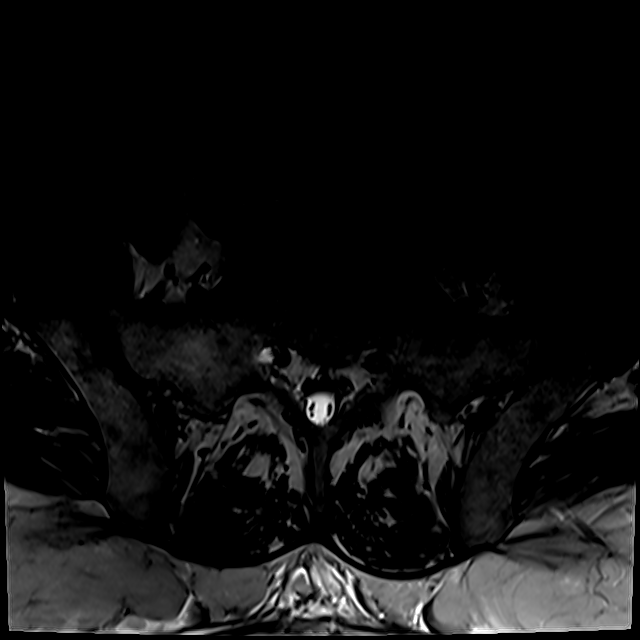
[im 6/41]
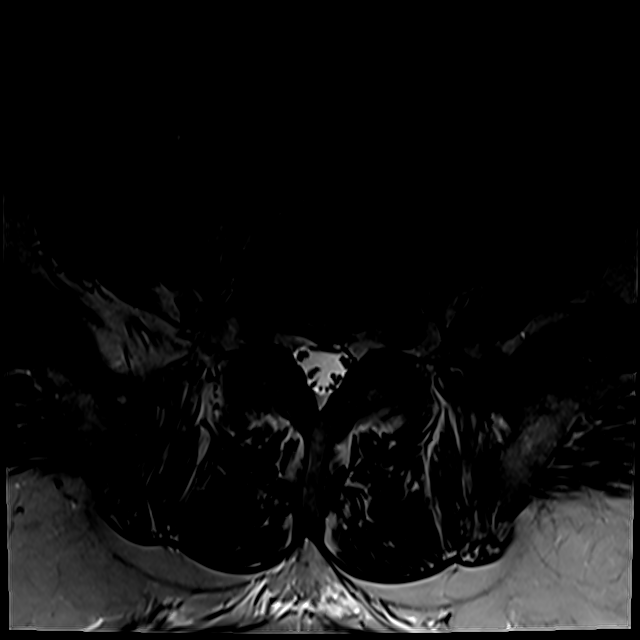
[im 12/41]
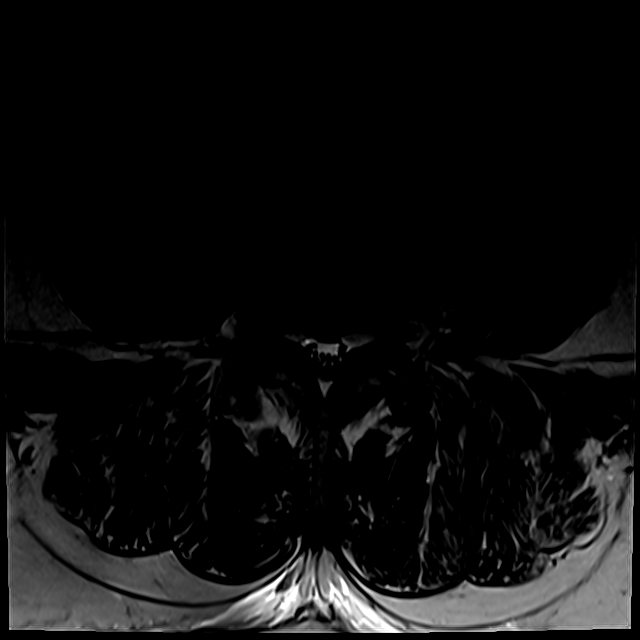
[im 18/41]
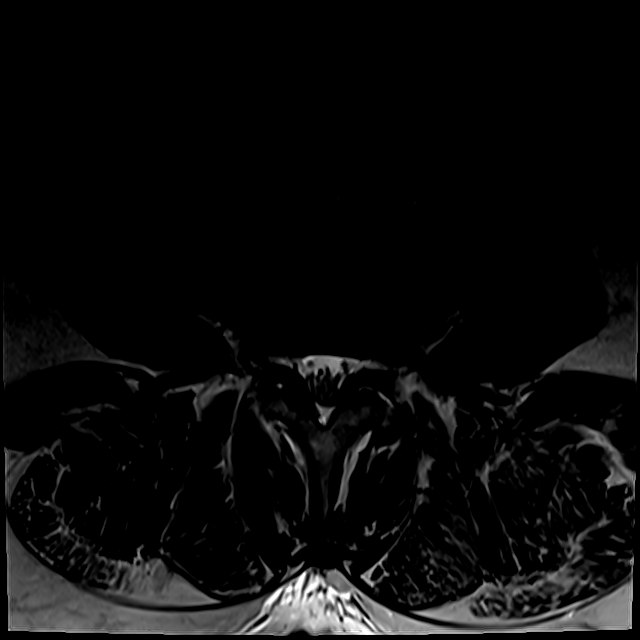
[im 21/41]
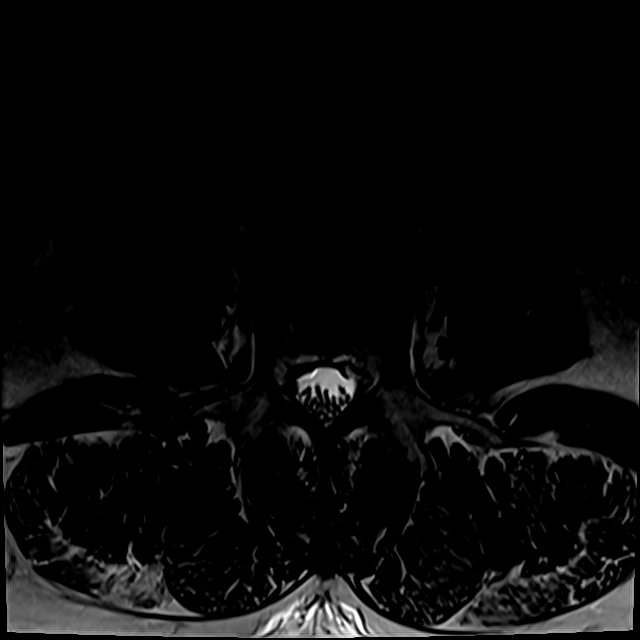
[im 35/41]
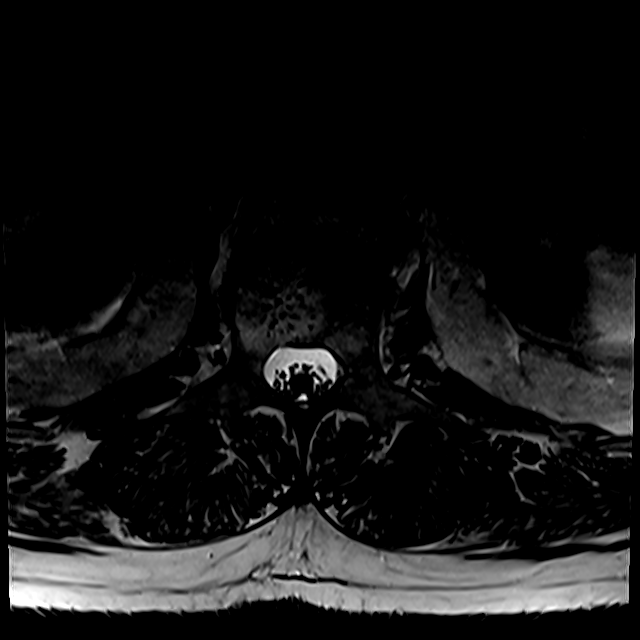

[18 of 48 positions shown; findings below may reference images not displayed]

FINDINGS: Segmentation:  5 lumbar type vertebrae

Alignment:  Slight anterolisthesis at L4-5, facet mediated

Vertebrae: No fracture, evidence of discitis, or aggressive bone
lesion. Incidental L1 hemangioma.

Conus medullaris and cauda equina: Conus extends to the L1-2 level.
Conus and cauda equina appear normal.

Paraspinal and other soft tissues: Asymmetric right renal atrophy.
Renal cystic intensities. Fatty infiltration of intrinsic back
muscles.

Disc levels:

T12- L1: Ventral spondylitic spurring.  No impingement

L1-L2: Ventral spondylitic spurring.  No impingement

L2-L3: Unremarkable.

L3-L4: Disc narrowing and bulging with small inferior foraminal
protrusion on the left.

L4-L5: Facet osteoarthritis with spurring and mild anterolisthesis.
Circumferential disc bulging. Moderate spinal stenosis with right
more than left subarticular recess impingement

L5-S1:Disc narrowing and bulging with asymmetric leftward endplate
spurring. Minor facet spurring. No impingement
IMPRESSION: 1. Multilevel spinal degeneration with mild L4-5 anterolisthesis.
2. L4-5 moderate spinal stenosis. Either L5 nerve root could be
affected in the subarticular recesses.

## 2020-10-27 ENCOUNTER — Other Ambulatory Visit: Payer: Self-pay | Admitting: Internal Medicine

## 2020-10-27 ENCOUNTER — Other Ambulatory Visit (HOSPITAL_COMMUNITY): Payer: Self-pay

## 2020-10-27 MED ORDER — INSULIN NPH (HUMAN) (ISOPHANE) 100 UNIT/ML ~~LOC~~ SUSP
SUBCUTANEOUS | 11 refills | Status: DC
  Filled 2020-10-27: qty 30, 27d supply, fill #0
  Filled 2020-12-01: qty 30, 27d supply, fill #1
  Filled 2021-01-01: qty 30, 27d supply, fill #2
  Filled 2021-02-09: qty 30, 27d supply, fill #3
  Filled 2021-03-13: qty 30, 27d supply, fill #4
  Filled 2021-04-20: qty 30, 27d supply, fill #5
  Filled 2021-05-20: qty 30, 27d supply, fill #6
  Filled 2021-06-19: qty 30, 27d supply, fill #7
  Filled 2021-07-22: qty 30, 27d supply, fill #8
  Filled 2021-08-24: qty 30, 27d supply, fill #9
  Filled 2021-09-28: qty 30, 27d supply, fill #10

## 2020-10-27 MED FILL — Insulin Regular (Human) Inj 100 Unit/ML: INTRAMUSCULAR | 33 days supply | Qty: 30 | Fill #2 | Status: AC

## 2020-10-27 NOTE — Telephone Encounter (Signed)
Per [name], NP this is ok to take together.

## 2020-10-27 NOTE — Telephone Encounter (Signed)
Patient spouse is calling back and stated that pt's medication that was switched to Lopid, pharmacy stated that medication couldn't be taken with simvastatin, please advise. CB is [phone]

## 2020-10-27 NOTE — Telephone Encounter (Signed)
Eric Choi (pt spouse DPR) Notifeid of update and verbalized undertsanding.

## 2020-10-28 ENCOUNTER — Other Ambulatory Visit (HOSPITAL_COMMUNITY): Payer: Self-pay

## 2020-10-29 ENCOUNTER — Other Ambulatory Visit (HOSPITAL_COMMUNITY): Payer: Self-pay

## 2020-10-29 ENCOUNTER — Other Ambulatory Visit: Payer: Self-pay | Admitting: Nephrology

## 2020-10-29 DIAGNOSIS — N184 Chronic kidney disease, stage 4 (severe): Secondary | ICD-10-CM | POA: Diagnosis not present

## 2020-10-29 DIAGNOSIS — I129 Hypertensive chronic kidney disease with stage 1 through stage 4 chronic kidney disease, or unspecified chronic kidney disease: Secondary | ICD-10-CM | POA: Diagnosis not present

## 2020-10-29 DIAGNOSIS — E785 Hyperlipidemia, unspecified: Secondary | ICD-10-CM | POA: Diagnosis not present

## 2020-10-29 DIAGNOSIS — I739 Peripheral vascular disease, unspecified: Secondary | ICD-10-CM | POA: Diagnosis not present

## 2020-10-29 DIAGNOSIS — N2581 Secondary hyperparathyroidism of renal origin: Secondary | ICD-10-CM | POA: Diagnosis not present

## 2020-10-29 DIAGNOSIS — N189 Chronic kidney disease, unspecified: Secondary | ICD-10-CM | POA: Diagnosis not present

## 2020-10-29 DIAGNOSIS — D631 Anemia in chronic kidney disease: Secondary | ICD-10-CM | POA: Diagnosis not present

## 2020-10-29 LAB — BASIC METABOLIC PANEL
CO2: 26 — AB (ref 13–22)
Chloride: 102 (ref 99–108)
Creatinine: 3.1 — AB (ref 0.6–1.3)
Glucose: 89
Potassium: 5.1 (ref 3.4–5.3)
Sodium: 137 (ref 137–147)

## 2020-10-29 LAB — COMPREHENSIVE METABOLIC PANEL
Albumin: 4.2 (ref 3.5–5.0)
Calcium: 10.2 (ref 8.7–10.7)

## 2020-11-24 ENCOUNTER — Ambulatory Visit: Payer: HMO | Admitting: Surgery

## 2020-11-24 ENCOUNTER — Encounter (HOSPITAL_COMMUNITY): Payer: HMO

## 2020-11-26 ENCOUNTER — Ambulatory Visit: Payer: HMO | Admitting: Internal Medicine

## 2020-11-26 DIAGNOSIS — N184 Chronic kidney disease, stage 4 (severe): Secondary | ICD-10-CM | POA: Diagnosis not present

## 2020-11-27 ENCOUNTER — Encounter: Payer: Self-pay | Admitting: Adult Health

## 2020-12-01 ENCOUNTER — Other Ambulatory Visit: Payer: Self-pay | Admitting: Internal Medicine

## 2020-12-01 ENCOUNTER — Other Ambulatory Visit (HOSPITAL_COMMUNITY): Payer: Self-pay

## 2020-12-01 MED ORDER — INSULIN REGULAR HUMAN 100 UNIT/ML IJ SOLN
INTRAMUSCULAR | 11 refills | Status: DC
  Filled 2020-12-01: qty 30, 33d supply, fill #0
  Filled 2021-01-01: qty 30, 33d supply, fill #1
  Filled 2021-02-09: qty 30, 33d supply, fill #2
  Filled 2021-03-13: qty 30, 33d supply, fill #3
  Filled 2021-04-20: qty 30, 33d supply, fill #4
  Filled 2021-06-19: qty 30, 33d supply, fill #5
  Filled 2021-07-22: qty 30, 33d supply, fill #6
  Filled 2021-08-24: qty 30, 33d supply, fill #7
  Filled 2021-09-28: qty 30, 33d supply, fill #8
  Filled 2021-11-02: qty 30, 33d supply, fill #9

## 2020-12-03 ENCOUNTER — Other Ambulatory Visit (HOSPITAL_COMMUNITY): Payer: Self-pay

## 2020-12-08 ENCOUNTER — Other Ambulatory Visit (HOSPITAL_COMMUNITY): Payer: Self-pay | Admitting: Surgery

## 2020-12-08 ENCOUNTER — Ambulatory Visit (HOSPITAL_COMMUNITY)
Admission: RE | Admit: 2020-12-08 | Discharge: 2020-12-08 | Disposition: A | Discharge status: Home / Self Care | Payer: HMO | Source: Ambulatory Visit | Attending: Surgery | Admitting: Surgery

## 2020-12-08 ENCOUNTER — Telehealth: Payer: Self-pay | Admitting: *Deleted

## 2020-12-08 ENCOUNTER — Other Ambulatory Visit: Payer: Self-pay

## 2020-12-08 ENCOUNTER — Ambulatory Visit: Payer: HMO | Admitting: Surgery

## 2020-12-08 ENCOUNTER — Encounter: Payer: Self-pay | Admitting: Surgery

## 2020-12-08 ENCOUNTER — Ambulatory Visit (INDEPENDENT_AMBULATORY_CARE_PROVIDER_SITE_OTHER)
Admission: RE | Admit: 2020-12-08 | Discharge: 2020-12-08 | Disposition: A | Discharge status: Home / Self Care | Payer: HMO | Source: Ambulatory Visit | Attending: Surgery | Admitting: Surgery

## 2020-12-08 VITALS — BP 137/69 | HR 76 | Temp 98.0°F | Resp 20 | Ht 68.0 in | Wt 225.6 lb

## 2020-12-08 DIAGNOSIS — R0989 Other specified symptoms and signs involving the circulatory and respiratory systems: Secondary | ICD-10-CM | POA: Diagnosis not present

## 2020-12-08 DIAGNOSIS — I70213 Atherosclerosis of native arteries of extremities with intermittent claudication, bilateral legs: Secondary | ICD-10-CM | POA: Diagnosis not present

## 2020-12-08 DIAGNOSIS — I6523 Occlusion and stenosis of bilateral carotid arteries: Secondary | ICD-10-CM | POA: Diagnosis not present

## 2020-12-08 DIAGNOSIS — I739 Peripheral vascular disease, unspecified: Secondary | ICD-10-CM | POA: Insufficient documentation

## 2020-12-08 NOTE — H&P (View-Only) (Signed)
 [facility]  Patient name: Eric Choi MRN: [id number] DOB: 09/07/1940 Sex: male     Follow up  HISOTRY OF PRESENT ILLNESS:    Eric Choi is a 80 y.o. male who I met in April 2021 for evaluation of possible claudication which have been present for several years but has been progressing.  He was walking approximately one half block.  This discomfort in his legs would resolve with 2 to 3 minutes of rest.  He also has difficulty with shortness of breath which also limits his activity.  He did feel that his legs were bothering him the most.  By ultrasound he had near normal blood flow on the left and approximately 50% on the right however his left leg bothers him the most.  I started him on cilostazol to see if he got any improvement.  I also encouraged him to pursue a work-up of his lower back to see if he had any degenerative back issues that could be contributing to his symptoms.  He is back today for follow-up.  He had severe diarrhea with cilostazol so he did not take it.  Again he states that his activity is limited by his left leg.  He does not have any open wounds.   The patient has a history of coronary artery disease, status post MI in the past.  He is a diabetic with a hemoglobin A1c around 7.  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  He is a former smoker.   PAST MEDICAL HISTORY:   Past Medical History:  Diagnosis Date   CAD (coronary artery disease)    Colon polyp    DIABETES MELLITUS, TYPE II 11/03/2006   HYPERLIPIDEMIA 11/03/2006   HYPERTENSION 11/03/2006   PAD (peripheral artery disease) ([facility])      FAMILY HISTORY:   Family History  Problem Relation Age of Onset   COPD Father    Alcohol abuse Father    Diabetes Father    Cancer Sister        breast   Stroke Mother    Colon cancer Neg Hx     SOCIAL HISTORY:   Social History   Tobacco Use   Smoking status: Former    Types:  Cigarettes    Quit date: 05/18/1991    Years since quitting: 29.5   Smokeless tobacco: Never  Substance Use Topics   Alcohol use: No     ALLERGIES:   No Known Allergies   CURRENT MEDICATIONS:   Current Outpatient Medications  Medication Sig Dispense Refill   amLODipine (NORVASC) 10 MG tablet TAKE ONE TABLET BY MOUTH DAILY 90 tablet 3   aspirin 81 MG tablet Take 81 mg by mouth daily.     Blood Glucose Monitoring Suppl (ONETOUCH VERIO) w/Device KIT 1 each by Does not apply route 4 (four) times daily. 1 kit 0   ezetimibe (ZETIA) 10 MG tablet TAKE ONE TABLET BY MOUTH DAILY 90 tablet 1   FLUBLOK QUADRIVALENT 0.5 ML injection      gemfibrozil (LOPID) 600 MG tablet Take 1 tablet (600 mg total) by mouth daily at 2 PM. 90 tablet 0   insulin NPH Human (NOVOLIN N) 100 UNIT/ML injection INJECT 45 - 55 UNITS UNDER THE SKIN BEFORE BREAKFAST AND DINNER 30 mL 11   insulin regular (NOVOLIN R) 100 units/mL injection INJECT 15-30 UNITS THREE TIMES DAILY BEFORE MEALS AS DIRECTED 30 mL 11   Lancets Misc. (ACCU-CHEK MULTICLIX LANCET   DEV) KIT Use to check sugar 4 times daily 400 each 5   lisinopril (ZESTRIL) 30 MG tablet TAKE ONE TABLET BY MOUTH DAILY 90 tablet 3   metoprolol tartrate (LOPRESSOR) 50 MG tablet TAKE ONE TABLET BY MOUTH DAILY 90 tablet 3   NEEDLE, DISP, 30 G 30G X 1" MISC by Does not apply route as directed.     ONETOUCH VERIO test strip USE TO TEST BLOOD SUGAR 4 TIMES DAILY AS NEEDED 400 strip 10   simvastatin (ZOCOR) 40 MG tablet TAKE ONE TABLET BY MOUTH AT BEDTIME 90 tablet 3   No current facility-administered medications for this visit.    REVIEW OF SYSTEMS:   [X] denotes positive finding, [ ] denotes negative finding Cardiac  Comments:  Chest pain or chest pressure:    Shortness of breath upon exertion:    Short of breath when lying flat:    Irregular heart rhythm:        Vascular    Pain in calf, thigh, or hip brought on by ambulation:    Pain in feet at night that wakes you  up from your sleep:     Blood clot in your veins:    Leg swelling:         Pulmonary    Oxygen at home:    Productive cough:     Wheezing:         Neurologic    Sudden weakness in arms or legs:     Sudden numbness in arms or legs:     Sudden onset of difficulty speaking or slurred speech:    Temporary loss of vision in one eye:     Problems with dizziness:         Gastrointestinal    Blood in stool:     Vomited blood:         Genitourinary    Burning when urinating:     Blood in urine:        Psychiatric    Major depression:         Hematologic    Bleeding problems:    Problems with blood clotting too easily:        Skin    Rashes or ulcers:        Constitutional    Fever or chills:      PHYSICAL EXAM:   Vitals:   12/08/20 0917  BP: 137/69  Pulse: 76  Resp: 20  Temp: 98 F (36.7 C)  SpO2: 98%  Weight: 225 lb 9.6 oz (102.3 kg)  Height: 5' 8" (1.727 m)    GENERAL: The patient is a well-nourished male, in no acute distress. The vital signs are documented above. CARDIAC: There is a regular rate and rhythm.  VASCULAR: non-palpable pedal pulses, left carotid bruit PULMONARY: Non-labored respirations ABDOMEN: Soft and non-tender. No palpable mass MUSCULOSKELETAL: There are no major deformities or cyanosis. NEUROLOGIC: No focal weakness or paresthesias are detected. SKIN: There are no ulcers or rashes noted. PSYCHIATRIC: The patient has a normal affect.  STUDIES:   I have reviewed the following: +-------+-----------+-----------+------------+------------+  ABI/TBIToday's ABIToday's TBIPrevious ABIPrevious TBI  +-------+-----------+-----------+------------+------------+  Right  0.5        0.35       0.46        0.65           MONOPHASIC +-------+-----------+-----------+------------+------------+  Left   0.79       0.55       0.99          0.71           BIPHASIC +-------+-----------+-----------+------------+------------+   Left toe:   95 Right toe:  60  Carotid: Right Carotid: Velocities in the right ICA are consistent with a 80-99%                 stenosis. Non-hemodynamically significant plaque <50% noted  in                 the CCA. The ECA appears >50% stenosed.   Left Carotid: Velocities in the left ICA are consistent with a 40-59%  stenosis.   Vertebrals:  Left vertebral artery demonstrates antegrade flow. Right  vertebral               artery was not visualized.  Subclavians: Normal flow hemodynamics were seen in bilateral subclavian               arteries.   MEDICAL ISSUES:   PAD: There has been a slight decrease in the ABIs on the left.  The right leg has a known iliac lesion and right superficial femoral artery occlusion and by ABIs is the worst leg.  The patient's main leg symptoms are in his left knee.  He does not endorse symptoms of claudication.  He is without ulceration.  I discussed that I would reserve intervention for symptomatic relief or for ulcer disease.  He will return in 1 year for follow-up, or sooner if he develops issues.  Carotid: I heard a carotid bruit on exam today and sent him for ultrasound.  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  I discussed proceeding with right carotid endarterectomy.  I discussed the details of the operation including the risks and benefits.  We discussed the risk of stroke, nerve injury and facial numbness.  He is already on aspirin and statin.  He wants to think about whether or not he will have surgery.  He will contact me to schedule this.  I will also have him see Dr. Mcalhany for surgical clearance.  Wells Anquanette Bahner, IV, MD, FACS [facility] [place] Tel [phone] Pager [phone]  

## 2020-12-08 NOTE — Progress Notes (Signed)
[facility] [place]  Patient name: Eric Choi MRN: [id number] DOB: 1941-05-15 Sex: male     Follow up  HISOTRY OF PRESENT ILLNESS:    Eric Choi is a 81 y.o. male who I met in April 2021 for evaluation of possible claudication which have been present for several years but has been progressing.  He was walking approximately one half block.  This discomfort in his legs would resolve with 2 to 3 minutes of rest.  He also has difficulty with shortness of breath which also limits his activity.  He did feel that his legs were bothering him the most.  By ultrasound he had near normal blood flow on the left and approximately 50% on the right however his left leg bothers him the most.  I started him on cilostazol to see if he got any improvement.  I also encouraged him to pursue a work-up of his lower back to see if he had any degenerative back issues that could be contributing to his symptoms.  He is back today for follow-up.  He had severe diarrhea with cilostazol so he did not take it.  Again he states that his activity is limited by his left leg.  He does not have any open wounds.   The patient has a history of coronary artery disease, status post MI in the past.  He is a diabetic with a hemoglobin A1c around 7.  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  He is a former smoker.   PAST MEDICAL HISTORY:   Past Medical History:  Diagnosis Date   CAD (coronary artery disease)    Colon polyp    DIABETES MELLITUS, TYPE II 11/03/2006   HYPERLIPIDEMIA 11/03/2006   HYPERTENSION 11/03/2006   PAD (peripheral artery disease) ([place])      FAMILY HISTORY:   Family History  Problem Relation Age of Onset   COPD Father    Alcohol abuse Father    Diabetes Father    Cancer Sister        breast   Stroke Mother    Colon cancer Neg Hx     SOCIAL HISTORY:   Social History   Tobacco Use   Smoking status: Former    Types:  Cigarettes    Quit date: 05/18/1991    Years since quitting: 29.5   Smokeless tobacco: Never  Substance Use Topics   Alcohol use: No     ALLERGIES:   No Known Allergies   CURRENT MEDICATIONS:   Current Outpatient Medications  Medication Sig Dispense Refill   amLODipine (NORVASC) 10 MG tablet TAKE ONE TABLET BY MOUTH DAILY 90 tablet 3   aspirin 81 MG tablet Take 81 mg by mouth daily.     Blood Glucose Monitoring Suppl (ONETOUCH VERIO) w/Device KIT 1 each by Does not apply route 4 (four) times daily. 1 kit 0   ezetimibe (ZETIA) 10 MG tablet TAKE ONE TABLET BY MOUTH DAILY 90 tablet 1   FLUBLOK QUADRIVALENT 0.5 ML injection      gemfibrozil (LOPID) 600 MG tablet Take 1 tablet (600 mg total) by mouth daily at 2 PM. 90 tablet 0   insulin NPH Human (NOVOLIN N) 100 UNIT/ML injection INJECT 45 - 55 UNITS UNDER THE SKIN BEFORE BREAKFAST AND DINNER 30 mL 11   insulin regular (NOVOLIN R) 100 units/mL injection INJECT 15-30 UNITS THREE TIMES DAILY BEFORE MEALS AS DIRECTED 30 mL 11   Lancets Misc. (ACCU-CHEK MULTICLIX LANCET  DEV) KIT Use to check sugar 4 times daily 400 each 5   lisinopril (ZESTRIL) 30 MG tablet TAKE ONE TABLET BY MOUTH DAILY 90 tablet 3   metoprolol tartrate (LOPRESSOR) 50 MG tablet TAKE ONE TABLET BY MOUTH DAILY 90 tablet 3   NEEDLE, DISP, 30 G 30G X 1" MISC by Does not apply route as directed.     ONETOUCH VERIO test strip USE TO TEST BLOOD SUGAR 4 TIMES DAILY AS NEEDED 400 strip 10   simvastatin (ZOCOR) 40 MG tablet TAKE ONE TABLET BY MOUTH AT BEDTIME 90 tablet 3   No current facility-administered medications for this visit.    REVIEW OF SYSTEMS:   _0  denotes positive finding, _1  denotes negative finding Cardiac  Comments:  Chest pain or chest pressure:    Shortness of breath upon exertion:    Short of breath when lying flat:    Irregular heart rhythm:        Vascular    Pain in calf, thigh, or hip brought on by ambulation:    Pain in feet at night that wakes you  up from your sleep:     Blood clot in your veins:    Leg swelling:         Pulmonary    Oxygen at home:    Productive cough:     Wheezing:         Neurologic    Sudden weakness in arms or legs:     Sudden numbness in arms or legs:     Sudden onset of difficulty speaking or slurred speech:    Temporary loss of vision in one eye:     Problems with dizziness:         Gastrointestinal    Blood in stool:     Vomited blood:         Genitourinary    Burning when urinating:     Blood in urine:        Psychiatric    Major depression:         Hematologic    Bleeding problems:    Problems with blood clotting too easily:        Skin    Rashes or ulcers:        Constitutional    Fever or chills:      PHYSICAL EXAM:   Vitals:   12/08/20 0917  BP: 137/69  Pulse: 76  Resp: 20  Temp: 98 F (36.7 C)  SpO2: 98%  Weight: 225 lb 9.6 oz (102.3 kg)  Height: _2  (1.727 m)    GENERAL: The patient is a well-nourished male, in no acute distress. The vital signs are documented above. CARDIAC: There is a regular rate and rhythm.  VASCULAR: non-palpable pedal pulses, left carotid bruit PULMONARY: Non-labored respirations ABDOMEN: Soft and non-tender. No palpable mass MUSCULOSKELETAL: There are no major deformities or cyanosis. NEUROLOGIC: No focal weakness or paresthesias are detected. SKIN: There are no ulcers or rashes noted. PSYCHIATRIC: The patient has a normal affect.  STUDIES:   I have reviewed the following: +-------+-----------+-----------+------------+------------+  ABI/TBIToday's ABIToday's TBIPrevious ABIPrevious TBI  +-------+-----------+-----------+------------+------------+  Right  0.5        0.35       0.46        0.65           MONOPHASIC +-------+-----------+-----------+------------+------------+  Left   0.79       0.55       0.99  0.62           BIPHASIC +-------+-----------+-----------+------------+------------+   Left toe:   95 Right toe:  60  Carotid: Right Carotid: Velocities in the right ICA are consistent with a 80-99%                 stenosis. Non-hemodynamically significant plaque <50% noted  in                 the CCA. The ECA appears >50% stenosed.   Left Carotid: Velocities in the left ICA are consistent with a 40-59%  stenosis.   Vertebrals:  Left vertebral artery demonstrates antegrade flow. Right  vertebral               artery was not visualized.  Subclavians: Normal flow hemodynamics were seen in bilateral subclavian               arteries.   MEDICAL ISSUES:   PAD: There has been a slight decrease in the ABIs on the left.  The right leg has a known iliac lesion and right superficial femoral artery occlusion and by ABIs is the worst leg.  The patient's main leg symptoms are in his left knee.  He does not endorse symptoms of claudication.  He is without ulceration.  I discussed that I would reserve intervention for symptomatic relief or for ulcer disease.  He will return in 1 year for follow-up, or sooner if he develops issues.  Carotid: I heard a carotid bruit on exam today and sent him for ultrasound.  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  I discussed proceeding with right carotid endarterectomy.  I discussed the details of the operation including the risks and benefits.  We discussed the risk of stroke, nerve injury and facial numbness.  He is already on aspirin and statin.  He wants to think about whether or not he will have surgery.  He will contact me to schedule this.  I will also have him see Dr. Angelena Form for surgical clearance.  Leia Alf, MD, FACS [facility] [phone] Pager [phone]

## 2020-12-08 NOTE — Telephone Encounter (Signed)
   [facility] Pre-operative Risk Assessment    Patient Name: Eric Choi  DOB: Mar 23, 1941 MRN: [id number]  HEARTCARE STAFF:  - IMPORTANT!!!!!! Under Visit Info/Reason for Call, type in Other and utilize the format Clearance MM/DD/YY or Clearance TBD. Do not use dashes or single digits. - Please review there is not already an duplicate clearance open for this procedure. - If request is for dental extraction, please clarify the # of teeth to be extracted. - If the patient is currently at the dentist's office, call Pre-Op Callback Staff (MA/nurse) to input urgent request.  - If the patient is not currently in the dentist office, please route to the Pre-Op pool.  Request for surgical clearance:  What type of surgery is being performed? RIGHT CAROTID ENDARTERECTOMY   When is this surgery scheduled? TBD  What type of clearance is required (medical clearance vs. Pharmacy clearance to hold med vs. Both)? MEDICAL  Are there any medications that need to be held prior to surgery and how long?  ASA   Practice name and name of physician performing surgery? [facility]; DR. Trula Slade  What is the office phone number? [phone]   7.   What is the office fax number? [phone]  8.   Anesthesia type (None, local, MAC, general) ? NOT LISTED   Julaine Hua 12/08/2020, 5:38 PM  _________________________________________________________________   (provider comments below)

## 2020-12-09 NOTE — Telephone Encounter (Signed)
Note has been added

## 2020-12-09 NOTE — Telephone Encounter (Signed)
Eric Choi 80 year old male was last seen in clinic on 5/22.  Dr. Angelena Form requested that he follow-up 3-4 months and pt has a visit scheduled 01/16/2021.  Patient surgery has not yet been scheduled.  Preoperative team please add preoperative cardiac evaluation to visit note.  I will defer patient's preoperative cardiac evaluation to Dr. Angelena Form at that time.  I will remove patient from preoperative pool.  Eric Ng. [name] NP-C    12/09/2020, 7:45 AM [place] [address] Office [phone] Fax [phone]

## 2020-12-11 NOTE — Telephone Encounter (Signed)
Eric Choi 80 year old male is requesting preoperative cardiac evaluation for right carotid endarterectomy.  He was last seen in the clinic on 09/15/2020.  During that time he denied chest pain, palpitations, lower extremity edema, orthopnea, and PND.  He had not had health changes over the last few years.  He continued to work medically marked and had little limitation of his ability to do hard work.  His echocardiogram 9/21 showed an EF of 50% with mild left ventricular hypokinesis and G1 DD.  Follow-up was planned for 3-4 months.  Has PMH includes coronary artery disease, colon polyps, type 2 diabetes, hyperlipidemia, hypertension, and PAD.  May his aspirin be held prior to his procedure?  Thank you for your help.  Please direct response to CV DIV pool.  Jossie Ng. Ajai Harville NP-C    12/11/2020, 1:08 PM [facility] [address] Office [phone] Fax [phone]

## 2020-12-11 NOTE — Telephone Encounter (Signed)
Kia from [facility] is calling to see if pt's pre op date be moved up prior to 01/16/21. Kia states it is okay to leave her a detailed message on her Voicemail if she unavailable  Kia can be reached at [phone]

## 2020-12-11 NOTE — Telephone Encounter (Signed)
Preop team please contact requesting office to discuss preoperative cardiac evaluation office visit in regards to surgery date.  It appears they are trying to move up surgery date.  Thank you,  Jossie Ng. Arizbeth Cawthorn NP-C    12/11/2020, 10:54 AM [place] [address] Office [phone] Fax [phone]

## 2020-12-11 NOTE — Telephone Encounter (Signed)
DPR ok to s/w pt's wife who is agreeable to appt at our [place] location 12/17/20 @ 8:15. I will forward clearance notes to FNP fo upcoming appt. Will send FYI to surgeon's office pt has appt 12/17/20.

## 2020-12-14 NOTE — Progress Notes (Signed)
Cardiology Office Note:    Date:  12/17/2020   ID:  Eric Choi, DOB 28-Dec-1940, MRN [id number]  PCP:  [name], NP   [facility] Providers Cardiologist:  Lauree Chandler, MD     Referring MD: [name], NP   Follow-up for coronary artery disease and preoperative cardiac evaluation  History of Present Illness:    Eric Choi is a 80 y.o. male with a hx of coronary artery disease, diabetes, CKD, hypertension, former tobacco use, peripheral arterial disease, and hyperlipidemia.  He was seen by Dr. Angelena Form 9/21 as a new cardiology consult and evaluation for his dyspnea on exertion.  He is followed by Dr. Trula Slade for his PAD.  He had an anterior MI in 1994 which was treated with arthrectomy to his LAD and no stenting.  He was then followed by Dr. Maurene Capes.  He is noted to have chronic dyspnea on exertion with no associated chest discomfort, lower extremity edema, dizziness, or palpitations.  His echocardiogram 9/21 showed an LVEF of 50, mild LVH, G1 DD and no significant valvular disease.  A nuclear stress test 9/21 showed no evidence of ischemia.  His last seen by Dr. Angelena Form on 09/15/2020.  During that time he denied any chest pain, palpitations, lower extremity edema, orthopnea, PND, dizziness, or syncope.  He continued to have DOE.  He worked at a [employer] and was able to regard throughout the day without limitation.  He presents to the clinic today for follow-up evaluation states he feels well.  He continues to work at the [employer] on Saturdays and Sundays.  He reports only occasional lower extremity cramping after increased physical activity.  He denies lower extremity cramping with ambulation.  He does note some lower back pain as well.  He uses Tylenol for his back discomfort.  We discussed the importance of low-sodium diet.  He reports he does have some dietary indiscretion related to sodium.  His blood pressure initially today was 154/62 on recheck 134/68.  I  will give him a salty 6 diet sheet, have him increase his physical activity as tolerated, and follow-up in 12 months.  Today he denies chest pain, shortness of breath, lower extremity edema, fatigue, palpitations, melena, hematuria, hemoptysis, diaphoresis, weakness, presyncope, syncope, orthopnea, and PND.   Past Medical History:  Diagnosis Date   CAD (coronary artery disease)    Colon polyp    DIABETES MELLITUS, TYPE II 11/03/2006   HYPERLIPIDEMIA 11/03/2006   HYPERTENSION 11/03/2006   PAD (peripheral artery disease) [facility])     Past Surgical History:  Procedure Laterality Date   ANGIOPLASTY  20 years ago   percutaneous transluminal    CATARACT EXTRACTION  2002, 2005   bilateral    Current Medications: Current Meds  Medication Sig   amLODipine (NORVASC) 10 MG tablet TAKE ONE TABLET BY MOUTH DAILY   aspirin 81 MG tablet Take 81 mg by mouth daily.   Blood Glucose Monitoring Suppl (ONETOUCH VERIO) w/Device KIT 1 each by Does not apply route 4 (four) times daily.   ezetimibe (ZETIA) 10 MG tablet TAKE ONE TABLET BY MOUTH DAILY   FLUBLOK QUADRIVALENT 0.5 ML injection    gemfibrozil (LOPID) 600 MG tablet Take 1 tablet (600 mg total) by mouth daily at 2 PM.   insulin NPH Human (NOVOLIN N) 100 UNIT/ML injection INJECT 45 - 55 UNITS UNDER THE SKIN BEFORE BREAKFAST AND DINNER   insulin regular (NOVOLIN R) 100 units/mL injection INJECT 15-30 UNITS THREE TIMES DAILY  BEFORE MEALS AS DIRECTED   Lancets Misc. (ACCU-CHEK MULTICLIX LANCET DEV) KIT Use to check sugar 4 times daily   lisinopril (ZESTRIL) 30 MG tablet TAKE ONE TABLET BY MOUTH DAILY   metoprolol tartrate (LOPRESSOR) 50 MG tablet TAKE ONE TABLET BY MOUTH DAILY   NEEDLE, DISP, 30 G 30G X 1" MISC by Does not apply route as directed.   ONETOUCH VERIO test strip USE TO TEST BLOOD SUGAR 4 TIMES DAILY AS NEEDED   simvastatin (ZOCOR) 40 MG tablet TAKE ONE TABLET BY MOUTH AT BEDTIME     Allergies:   Patient has no known allergies.   Social  History   Socioeconomic History   Marital status: Married    Spouse name: Not on file   Number of children: 2   Years of education: Not on file   Highest education level: Not on file  Occupational History   Occupation: Retired-Truck driver  Tobacco Use   Smoking status: Former    Types: Cigarettes    Quit date: 05/18/1991    Years since quitting: 29.6   Smokeless tobacco: Never  Vaping Use   Vaping Use: Never used  Substance and Sexual Activity   Alcohol use: No   Drug use: No   Sexual activity: Not on file  Other Topics Concern   Not on file  Social History Narrative   Works 3rd shift   Regular Exercise-yes   Social Determinants of Radio broadcast assistant Strain: Low Risk    Difficulty of Paying Living Expenses: Not hard at all  Food Insecurity: No Food Insecurity   Worried About Charity fundraiser in the Last Year: Never true   Arboriculturist in the Last Year: Never true  Transportation Needs: No Transportation Needs   Lack of Transportation (Medical): No   Lack of Transportation (Non-Medical): No  Physical Activity: Not on file  Stress: Not on file  Social Connections: Not on file     Family History: The patient's family history includes Alcohol abuse in his father; COPD in his father; Cancer in his sister; Diabetes in his father; Stroke in his mother. There is no history of Colon cancer.  ROS:   Please see the history of present illness.     All other systems reviewed and are negative.             Physical Exam:    VS:  BP (!) 154/62   Pulse 75   Ht 5' 6.5" (1.689 m)   Wt 227 lb (103 kg)   SpO2 95%   BMI 36.09 kg/m     Wt Readings from Last 3 Encounters:  12/17/20 227 lb (103 kg)  12/08/20 225 lb 9.6 oz (102.3 kg)  09/15/20 228 lb 6.4 oz (103.6 kg)     GEN:  Well nourished, well developed in no acute distress HEENT: Normal NECK: No JVD; No carotid bruits LYMPHATICS: No lymphadenopathy CARDIAC: RRR, no murmurs, rubs,  gallops RESPIRATORY:  Clear to auscultation without rales, wheezing or rhonchi  ABDOMEN: Soft, non-tender, non-distended MUSCULOSKELETAL:  No edema; No deformity  SKIN: Warm and dry NEUROLOGIC:  Alert and oriented x 3 PSYCHIATRIC:  Normal affect    EKGs/Labs/Other Studies Reviewed:    The following studies were reviewed today:  Echocardiogram 02/06/2020   Left ventricular ejection fraction, by estimation, is 50%. The left  ventricle has mildly decreased function. The left ventricle demonstrates  regional wall motion abnormalities with mid-apical inferoseptal and  anteroseptal mild hypokinesis.  There is  mild left ventricular hypertrophy. Left ventricular diastolic parameters  are consistent with Grade I diastolic dysfunction (impaired relaxation).   2. Right ventricular systolic function is normal. The right ventricular  size is normal. Tricuspid regurgitation signal is inadequate for assessing  PA pressure.   3. The mitral valve is normal in structure. No evidence of mitral valve  regurgitation. No evidence of mitral stenosis.   4. The aortic valve is tricuspid. Aortic valve regurgitation is not  visualized. Mild aortic valve sclerosis is present, with no evidence of  aortic valve stenosis.   5. IVC not visualized.  EKG:  EKG is ordered today.  The ekg ordered today demonstrates sinus rhythm with occasional premature ventricular complexes 75 bpm  Recent Labs: 06/11/2020: ALT 18; Hemoglobin 12.9; Platelets 356.0 08/26/2020: TSH 3.67 09/05/2020: BUN 32 10/29/2020: Creatinine 3.1; Potassium 5.1; Sodium 137  Recent Lipid Panel    Component Value Date/Time   CHOL 128 09/05/2020 0800   TRIG 202.0 (H) 09/05/2020 0800   HDL 36.10 (L) 09/05/2020 0800   CHOLHDL 4 09/05/2020 0800   VLDL 40.4 (H) 09/05/2020 0800   LDLCALC 52 09/03/2019 0735   LDLDIRECT 69.0 09/05/2020 0800    ASSESSMENT & PLAN    Coronary artery disease-no chest pain today.  No recent episodes of arm neck or back  discomfort.  Continues to be very physically active.  Nuclear stress test 9/21 showed no ischemia.  Echocardiogram 9/21 showed normal LVEF. Continue aspirin, amlodipine, ezetimibe, metoprolol, simvastatin Heart healthy low-sodium diet-salty 6 given Increase physical activity as tolerated  Hyperlipidemia-09/05/2020: Cholesterol 128; HDL 36.10; Triglycerides 202.0; VLDL 40.4 Continue ezetimibe, simvastatin Heart healthy low-sodium high-fiber diet Increase physical activity as tolerated  Essential hypertension-BP today 134/58.  Well-controlled at home. Continue amlodipine, metoprolol Heart healthy low-sodium diet-salty 6 given Increase physical activity as tolerated  Peripheral arterial disease-denies claudication.  No limitation in physical activity.  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 Follows with Dr. Trula Slade  Preoperative cardiac evaluation-right carotid endarterectomy, Dr. Trula Slade     Primary Cardiologist: Lauree Chandler, MD  Chart reviewed as part of pre-operative protocol coverage. Given past medical history and time since last visit, based on ACC/AHA guidelines, Eric Choi would be at acceptable risk for the planned procedure without further cardiovascular testing.   His RCRI is a class IV risk, 11% risk of major cardiac event.  He is able to complete greater than 4 METS of physical activity.  His aspirin may be held for 5-7 days prior to his surgery.  Please resume as soon as hemostasis is achieved.  Patient was advised that if he/ develops new symptoms prior to surgery to contact our office to arrange a follow-up appointment.  He verbalized understanding.  I will route this recommendation to the requesting party via Epic fax function and remove from pre-op pool.  Please call with questions.    Medication Adjustments/Labs and Tests Ordered: Current medicines are reviewed at length with the patient today.  Concerns regarding medicines  are outlined above.  No orders of the defined types were placed in this encounter.  No orders of the defined types were placed in this encounter.   There are no Patient Instructions on file for this visit.   Signed, Deberah Pelton, NP  12/17/2020 8:21 AM      Notice: This dictation was prepared with Dragon dictation along with smaller phrase technology. Any transcriptional errors that result from this process are unintentional and may not  be corrected upon review.  I spent 14 minutes examining this patient, reviewing medications, and using patient centered shared decision making involving her cardiac care.  Prior to her visit I spent greater than 20 minutes reviewing her past medical history,  medications, and prior cardiac tests.

## 2020-12-17 ENCOUNTER — Ambulatory Visit (HOSPITAL_BASED_OUTPATIENT_CLINIC_OR_DEPARTMENT_OTHER): Payer: HMO | Admitting: General Practice

## 2020-12-17 ENCOUNTER — Encounter (HOSPITAL_BASED_OUTPATIENT_CLINIC_OR_DEPARTMENT_OTHER): Payer: Self-pay | Admitting: General Practice

## 2020-12-17 ENCOUNTER — Other Ambulatory Visit: Payer: Self-pay

## 2020-12-17 VITALS — BP 134/68 | HR 75 | Ht 66.5 in | Wt 227.0 lb

## 2020-12-17 DIAGNOSIS — I10 Essential (primary) hypertension: Secondary | ICD-10-CM

## 2020-12-17 DIAGNOSIS — E7800 Pure hypercholesterolemia, unspecified: Secondary | ICD-10-CM

## 2020-12-17 DIAGNOSIS — I739 Peripheral vascular disease, unspecified: Secondary | ICD-10-CM | POA: Diagnosis not present

## 2020-12-17 DIAGNOSIS — Z01810 Encounter for preprocedural cardiovascular examination: Secondary | ICD-10-CM

## 2020-12-17 DIAGNOSIS — I2510 Atherosclerotic heart disease of native coronary artery without angina pectoris: Secondary | ICD-10-CM | POA: Diagnosis not present

## 2020-12-17 NOTE — Patient Instructions (Signed)
Medication Instructions:  Your physician recommends that you continue on your current medications as directed. Please refer to the Current Medication list given to you today.  *If you need a refill on your cardiac medications before your next appointment, please call your pharmacy*   Lab Work: None  Testing/Procedures: None   Follow-Up: At [facility], you and your health needs are our priority.  As part of our continuing mission to provide you with exceptional heart care, we have created designated Provider Care Teams.  These Care Teams include your primary Cardiologist (physician) and Advanced Practice Providers (APPs -  Physician Assistants and Nurse Practitioners) who all work together to provide you with the care you need, when you need it.  We recommend signing up for the patient portal called "MyChart".  Sign up information is provided on this After Visit Summary.  MyChart is used to connect with patients for Virtual Visits (Telemedicine).  Patients are able to view lab/test results, encounter notes, upcoming appointments, etc.  Non-urgent messages can be sent to your provider as well.   To learn more about what you can do with MyChart, go to [email or web address].    Your next appointment:   1 year(s)  The format for your next appointment:   In Person  Provider:   You may see Lauree Chandler, MD or one of the following Advanced Practice Providers on your designated Care Team:   Melina Copa, PA-C Ermalinda Barrios, PA-C   Other Instructions Coletta Memos, NP recommends continuing to record your BP readings at home and increasing physical activity as tolerated. He will send his note from todays visit to Dr. Trula Slade, clearing your for surgery.   Tips to Measure your Blood Pressure Correctly  To determine whether you have hypertension, a medical professional will take a blood pressure reading. How you prepare for the test, the position of your arm, and other factors can  change a blood pressure reading by 10% or more. That could be enough to hide high blood pressure, start you on a drug you don't really need, or lead your doctor to incorrectly adjust your medications.  National and international guidelines offer specific instructions for measuring blood pressure. If a doctor, nurse, or medical assistant isn't doing it right, don't hesitate to ask him or her to get with the guidelines.  Here's what you can do to ensure a correct reading:  Don't drink a caffeinated beverage or smoke during the 30 minutes before the test.  Sit quietly for five minutes before the test begins.  During the measurement, sit in a chair with your feet on the floor and your arm supported so your elbow is at about heart level.  The inflatable part of the cuff should completely cover at least 80% of your upper arm, and the cuff should be placed on bare skin, not over a shirt.  Don't talk during the measurement.  Have your blood pressure measured twice, with a brief break in between. If the readings are different by 5 points or more, have it done a third time.  In 2017, new guidelines from the [place], the [employer] of Cardiology, and nine other health organizations lowered the diagnosis of high blood pressure to 130/80 mm Hg or higher for all adults. The guidelines also redefined the various blood pressure categories to now include normal, elevated, Stage 1 hypertension, Stage 2 hypertension, and hypertensive crisis (see "Blood pressure categories").  Blood pressure categories  Blood pressure category SYSTOLIC (upper number)  DIASTOLIC (lower number)  Normal Less than 120 mm Hg and Less than 80 mm Hg  Elevated 120-129 mm Hg and Less than 80 mm Hg  High blood pressure: Stage 1 hypertension 130-139 mm Hg or 80-89 mm Hg  High blood pressure: Stage 2 hypertension 140 mm Hg or higher or 90 mm Hg or higher  Hypertensive crisis (consult your doctor immediately) Higher  than 180 mm Hg and/or Higher than 120 mm Hg  Source: American Heart Association and American Stroke Association. For more on getting your blood pressure under control, buy Controlling Your Blood Pressure, a Special Health Report from [facility].   Blood Pressure Log   Date   Time  Blood Pressure  Position  Example: Nov 1 9 AM 124/78 sitting

## 2020-12-18 ENCOUNTER — Other Ambulatory Visit: Payer: Self-pay

## 2020-12-25 NOTE — Progress Notes (Signed)
Surgical Instructions    Your procedure is scheduled on Wednesday August 17th.  Report to [facility] Main Entrance "A" at 9 A.M., then check in with the Admitting office.  Call this number if you have problems the morning of surgery:  [phone]   If you have any questions prior to your surgery date call [phone]: Open Monday-Friday 8am-4pm    Remember:  Do not eat or drink anything after midnight the night before your surgery     Take these medicines the morning of surgery with A SIP OF WATER  amLODipine (NORVASC) 10 MG tablet ezetimibe (ZETIA) 10 MG tablet metoprolol tartrate (LOPRESSOR) 50 MG tablet  IF NEEDED acetaminophen (TYLENOL) 500 MG tablet Bismuth Subsalicylate (PEPTO-BISMOL) 262 MG TABS  Follow your surgeon's instructions on when to stop Aspirin.  If no instructions were given by your surgeon then you will need to call the office to get those instructions.     As of today, STOP taking any Aspirin (unless otherwise instructed by your surgeon) Aleve, Naproxen, Ibuprofen, Motrin, Advil, Goody's, BC's, all herbal medications, fish oil, and all vitamins.  WHAT DO I DO ABOUT MY DIABETES MEDICATION?   Do not take oral diabetes medicines (pills) the morning of surgery.  THE NIGHT BEFORE SURGERY, take ______NO_____ units of _insulin regular (NOVOLIN R)             THE NIGHT BEFORE SURGERY, take  22  units of   insulin NPH Human (NOVOLIN N) - which is 50% of your normal dose.   THE MORNING OF SURGERY, if your blood sugar is greater than 220 - take 1/2 or 50% of your usual correction dose of insulin regular (NOVOLIN R) .  THE MORNING OF SURGERY, take  22  units of   insulin NPH Human (NOVOLIN N) - which is 50% of your normal dose  The day of surgery, do not take other diabetes injectables, including Byetta (exenatide), Bydureon (exenatide ER), Victoza (liraglutide), or Trulicity (dulaglutide).  If your CBG is greater than 220 mg/dL, you may take  of your sliding  scale (correction) dose of insulin.   HOW TO MANAGE YOUR DIABETES BEFORE AND AFTER SURGERY  Why is it important to control my blood sugar before and after surgery? Improving blood sugar levels before and after surgery helps healing and can limit problems. A way of improving blood sugar control is eating a healthy diet by:  Eating less sugar and carbohydrates  Increasing activity/exercise  Talking with your doctor about reaching your blood sugar goals High blood sugars (greater than 180 mg/dL) can raise your risk of infections and slow your recovery, so you will need to focus on controlling your diabetes during the weeks before surgery. Make sure that the doctor who takes care of your diabetes knows about your planned surgery including the date and location.  How do I manage my blood sugar before surgery? Check your blood sugar at least 4 times a day, starting 2 days before surgery, to make sure that the level is not too high or low.  Check your blood sugar the morning of your surgery when you wake up and every 2 hours until you get to the Short Stay unit.  If your blood sugar is less than 70 mg/dL, you will need to treat for low blood sugar: Do not take insulin. Treat a low blood sugar (less than 70 mg/dL) with  cup of clear juice (cranberry or apple), 4 glucose tablets, OR glucose gel. Recheck blood sugar  in 15 minutes after treatment (to make sure it is greater than 70 mg/dL). If your blood sugar is not greater than 70 mg/dL on recheck, call [phone] for further instructions. Report your blood sugar to the short stay nurse when you get to Short Stay.  If you are admitted to the hospital after surgery: Your blood sugar will be checked by the staff and you will probably be given insulin after surgery (instead of oral diabetes medicines) to make sure you have good blood sugar levels. The goal for blood sugar control after surgery is 80-180 mg/dL.           Do not wear jewelry  Do  not wear lotions, powders, colognes, or deodorant. Do not shave 48 hours prior to surgery.  Men may shave face and neck. Do not bring valuables to the hospital. DO Not wear nail polish, gel polish, artificial nails, or any other type of covering on  natural nails including finger and toenails. If patients have artificial nails, gel coating, etc. that need to be removed by a nail salon please have this removed prior to surgery or surgery may need to be canceled/delayed if the surgeon/ anesthesia feels like the patient is unable to be adequately monitored.             [facility] is not responsible for any belongings or valuables.  Do NOT Smoke (Tobacco/Vaping) or drink Alcohol 24 hours prior to your procedure If you use a CPAP at night, you may bring all equipment for your overnight stay.   Contacts, glasses, dentures or bridgework may not be worn into surgery, please bring cases for these belongings   For patients admitted to the hospital, discharge time will be determined by your treatment team.   Patients discharged the day of surgery will not be allowed to drive home, and someone needs to stay with them for 24 hours.  ONLY 1 SUPPORT PERSON MAY BE PRESENT WHILE YOU ARE IN SURGERY. IF YOU ARE TO BE ADMITTED ONCE YOU ARE IN YOUR ROOM YOU WILL BE ALLOWED TWO (2) VISITORS.  Minor children may have two parents present. Special consideration for safety and communication needs will be reviewed on a case by case basis.  Special instructions:    Oral Hygiene is also important to reduce your risk of infection.  Remember - BRUSH YOUR TEETH THE MORNING OF SURGERY WITH YOUR REGULAR TOOTHPASTE   [place]- Preparing For Surgery  Before surgery, you can play an important role. Because skin is not sterile, your skin needs to be as free of germs as possible. You can reduce the number of germs on your skin by washing with CHG (chlorahexidine gluconate) Soap before surgery.  CHG is an antiseptic cleaner  which kills germs and bonds with the skin to continue killing germs even after washing.     Please do not use if you have an allergy to CHG or antibacterial soaps. If your skin becomes reddened/irritated stop using the CHG.  Do not shave (including legs and underarms) for at least 48 hours prior to first CHG shower. It is OK to shave your face.  Please follow these instructions carefully.     Shower the NIGHT BEFORE SURGERY and the MORNING OF SURGERY with CHG Soap.   If you chose to wash your hair, wash your hair first as usual with your normal shampoo. After you shampoo, rinse your hair and body thoroughly to remove the shampoo.  Then [employer] and genitals (private  parts) with your normal soap and rinse thoroughly to remove soap.  After that Use CHG Soap as you would any other liquid soap. You can apply CHG directly to the skin and wash gently with a scrungie or a clean washcloth.   Apply the CHG Soap to your body ONLY FROM THE NECK DOWN.  Do not use on open wounds or open sores. Avoid contact with your eyes, ears, mouth and genitals (private parts). Wash Face and genitals (private parts)  with your normal soap.   Wash thoroughly, paying special attention to the area where your surgery will be performed.  Thoroughly rinse your body with warm water from the neck down.  DO NOT shower/wash with your normal soap after using and rinsing off the CHG Soap.  Pat yourself dry with a CLEAN TOWEL.  Wear CLEAN PAJAMAS to bed the night before surgery  Place CLEAN SHEETS on your bed the night before your surgery  DO NOT SLEEP WITH PETS.   Day of Surgery:  Take a shower with CHG soap. Wear Clean/Comfortable clothing the morning of surgery Do not apply any deodorants/lotions.   Remember to brush your teeth WITH YOUR REGULAR TOOTHPASTE.   Please read over the following fact sheets that you were given.

## 2020-12-26 ENCOUNTER — Encounter (HOSPITAL_COMMUNITY)
Admission: RE | Admit: 2020-12-26 | Discharge: 2020-12-26 | Disposition: A | Discharge status: Home / Self Care | Payer: HMO | Source: Ambulatory Visit | Attending: Surgery | Admitting: Surgery

## 2020-12-26 ENCOUNTER — Encounter (HOSPITAL_COMMUNITY): Payer: Self-pay

## 2020-12-26 ENCOUNTER — Other Ambulatory Visit: Payer: Self-pay

## 2020-12-26 DIAGNOSIS — Z7901 Long term (current) use of anticoagulants: Secondary | ICD-10-CM | POA: Insufficient documentation

## 2020-12-26 DIAGNOSIS — I252 Old myocardial infarction: Secondary | ICD-10-CM | POA: Insufficient documentation

## 2020-12-26 DIAGNOSIS — E669 Obesity, unspecified: Secondary | ICD-10-CM | POA: Insufficient documentation

## 2020-12-26 DIAGNOSIS — Z01812 Encounter for preprocedural laboratory examination: Secondary | ICD-10-CM | POA: Insufficient documentation

## 2020-12-26 DIAGNOSIS — I6523 Occlusion and stenosis of bilateral carotid arteries: Secondary | ICD-10-CM | POA: Diagnosis not present

## 2020-12-26 DIAGNOSIS — I739 Peripheral vascular disease, unspecified: Secondary | ICD-10-CM | POA: Insufficient documentation

## 2020-12-26 DIAGNOSIS — Z7982 Long term (current) use of aspirin: Secondary | ICD-10-CM | POA: Diagnosis not present

## 2020-12-26 DIAGNOSIS — Z6836 Body mass index (BMI) 36.0-36.9, adult: Secondary | ICD-10-CM | POA: Insufficient documentation

## 2020-12-26 DIAGNOSIS — Z79899 Other long term (current) drug therapy: Secondary | ICD-10-CM | POA: Diagnosis not present

## 2020-12-26 DIAGNOSIS — Z87891 Personal history of nicotine dependence: Secondary | ICD-10-CM | POA: Insufficient documentation

## 2020-12-26 DIAGNOSIS — Z794 Long term (current) use of insulin: Secondary | ICD-10-CM | POA: Insufficient documentation

## 2020-12-26 DIAGNOSIS — I10 Essential (primary) hypertension: Secondary | ICD-10-CM | POA: Insufficient documentation

## 2020-12-26 DIAGNOSIS — E118 Type 2 diabetes mellitus with unspecified complications: Secondary | ICD-10-CM | POA: Diagnosis not present

## 2020-12-26 DIAGNOSIS — I2510 Atherosclerotic heart disease of native coronary artery without angina pectoris: Secondary | ICD-10-CM | POA: Insufficient documentation

## 2020-12-26 HISTORY — DX: Dyspnea, unspecified: R06.00

## 2020-12-26 HISTORY — DX: Acute myocardial infarction, unspecified: I21.9

## 2020-12-26 LAB — TYPE AND SCREEN
ABO/RH(D): A POS
Antibody Screen: NEGATIVE

## 2020-12-26 LAB — CBC
HCT: 42.4 % (ref 39.0–52.0)
Hemoglobin: 14.3 g/dL (ref 13.0–17.0)
MCH: 30.6 pg (ref 26.0–34.0)
MCHC: 33.7 g/dL (ref 30.0–36.0)
MCV: 90.6 fL (ref 80.0–100.0)
Platelets: 324 10*3/uL (ref 150–400)
RBC: 4.68 MIL/uL (ref 4.22–5.81)
RDW: 14.2 % (ref 11.5–15.5)
WBC: 13.8 10*3/uL — ABNORMAL HIGH (ref 4.0–10.5)
nRBC: 0 % (ref 0.0–0.2)

## 2020-12-26 LAB — URINALYSIS, ROUTINE W REFLEX MICROSCOPIC
Bacteria, UA: NONE SEEN
Bilirubin Urine: NEGATIVE
Glucose, UA: >=500 mg/dL — AB
Ketones, ur: NEGATIVE mg/dL
Leukocytes,Ua: NEGATIVE
Nitrite: NEGATIVE
Protein, ur: 30 mg/dL — AB
Specific Gravity, Urine: 1.017 (ref 1.005–1.030)
pH: 5 (ref 5.0–8.0)

## 2020-12-26 LAB — COMPREHENSIVE METABOLIC PANEL
ALT: 25 U/L (ref 0–44)
AST: 21 U/L (ref 15–41)
Albumin: 3.9 g/dL (ref 3.5–5.0)
Alkaline Phosphatase: 42 U/L (ref 38–126)
Anion gap: 9 (ref 5–15)
BUN: 28 mg/dL — ABNORMAL HIGH (ref 8–23)
CO2: 20 mmol/L — ABNORMAL LOW (ref 22–32)
Calcium: 10 mg/dL (ref 8.9–10.3)
Chloride: 105 mmol/L (ref 98–111)
Creatinine, Ser: 2.13 mg/dL — ABNORMAL HIGH (ref 0.61–1.24)
GFR, Estimated: 31 mL/min — ABNORMAL LOW (ref >60)
Glucose, Bld: 217 mg/dL — ABNORMAL HIGH (ref 70–99)
Potassium: 4.5 mmol/L (ref 3.5–5.1)
Sodium: 134 mmol/L — ABNORMAL LOW (ref 135–145)
Total Bilirubin: 0.5 mg/dL (ref 0.3–1.2)
Total Protein: 7.2 g/dL (ref 6.5–8.1)

## 2020-12-26 LAB — PROTIME-INR
INR: 1.1 (ref 0.8–1.2)
Prothrombin Time: 14.3 seconds (ref 11.4–15.2)

## 2020-12-26 LAB — SURGICAL PCR SCREEN
MRSA, PCR: NEGATIVE
Staphylococcus aureus: NEGATIVE

## 2020-12-26 LAB — GLUCOSE, CAPILLARY: Glucose-Capillary: 267 mg/dL — ABNORMAL HIGH (ref 70–99)

## 2020-12-26 LAB — APTT: aPTT: 30 seconds (ref 24–36)

## 2020-12-26 LAB — HEMOGLOBIN A1C
Hgb A1c MFr Bld: 8.8 % — ABNORMAL HIGH (ref 4.8–5.6)
Mean Plasma Glucose: 205.86 mg/dL

## 2020-12-26 NOTE — Progress Notes (Signed)
   12/26/20 0817  OBSTRUCTIVE SLEEP APNEA  Have you ever been diagnosed with sleep apnea through a sleep study? No  Do you snore loudly (loud enough to be heard through closed doors)?  0  Do you often feel tired, fatigued, or sleepy during the daytime (such as falling asleep during driving or talking to someone)? 0  Has anyone observed you stop breathing during your sleep? 0  Do you have, or are you being treated for high blood pressure? 1  BMI more than 35 kg/m2? 1  Age > 50 (1-yes) 1  Neck circumference greater than:Male 16 inches or larger, Male 17inches or larger? 1 (18 inch)  Male Gender (Yes=1) 1  Obstructive Sleep Apnea Score 5  Score 5 or greater  Results sent to PCP

## 2020-12-26 NOTE — Progress Notes (Signed)
PCP: [name] NP Cardiologist: Dr. Angelena Form Endocrinology: Dr. Letta Median  EKG: 12-17-20 CXR: n/a ECHO:02/06/20 Stress Test: 02/06/20 Cardiac Cath: "Possibly 90's after MI"  Requisition giving for Covid testing  Fasting Blood Sugar-100-200 Checks Blood Sugar 2-3 times a day CBG 267 this morning. Pt ate cereal and milk prior to arrival.  Checked CBG at home, fasting was 110 before having breakfast.  Did take home insulin.  Chart forwarded to anesthesia  Patient denies shortness of breath, fever, cough, and chest pain at PAT appointment.  Patient verbalized understanding of instructions provided today at the PAT appointment.  Patient asked to review instructions at home and day of surgery.

## 2020-12-26 NOTE — Progress Notes (Addendum)
Surgical Instructions    Your procedure is scheduled on Wednesday August 17th.  Report to [facility] Main Entrance "A" at 9:10 A.M., then check in with the Admitting office.  Call this number if you have problems the morning of surgery:  [phone]   If you have any questions prior to your surgery date call [phone]: Open Monday-Friday 8am-4pm    Remember:  Do not eat or drink anything after midnight the night before your surgery     Take these medicines the morning of surgery with A SIP OF WATER  amLODipine (NORVASC)  ezetimibe (ZETIA)  metoprolol tartrate (LOPRESSOR)  Aspirin  IF NEEDED acetaminophen (TYLENOL) 500 MG tablet  As of today, STOP taking any  Aleve, Naproxen, Ibuprofen, Motrin, Advil, Goody's, BC's, all herbal medications, fish oil, and all vitamins.  WHAT DO I DO ABOUT MY DIABETES MEDICATION?   Do not take oral diabetes medicines (pills) the morning of surgery.  THE NIGHT BEFORE SURGERY, take ______NO_____ units of _insulin regular (NOVOLIN R) at BEDTIME. (You may take normal mealtime doses)            THE NIGHT BEFORE SURGERY, take  22  units of   insulin NPH Human (NOVOLIN N) - which is 50% of your normal dose.   THE MORNING OF SURGERY, if your blood sugar is greater than 220 - take 1/2 or 50% of your usual correction dose of insulin regular (NOVOLIN R) .  THE MORNING OF SURGERY, take  22  units of   insulin NPH Human (NOVOLIN N) - which is 50% of your normal dose  The day of surgery, do not take other diabetes injectables, including Byetta (exenatide), Bydureon (exenatide ER), Victoza (liraglutide), or Trulicity (dulaglutide).  If your CBG is greater than 220 mg/dL, you may take  of your sliding scale (correction) dose of insulin.   HOW TO MANAGE YOUR DIABETES BEFORE AND AFTER SURGERY  Why is it important to control my blood sugar before and after surgery? Improving blood sugar levels before and after surgery helps healing and can limit  problems. A way of improving blood sugar control is eating a healthy diet by:  Eating less sugar and carbohydrates  Increasing activity/exercise  Talking with your doctor about reaching your blood sugar goals High blood sugars (greater than 180 mg/dL) can raise your risk of infections and slow your recovery, so you will need to focus on controlling your diabetes during the weeks before surgery. Make sure that the doctor who takes care of your diabetes knows about your planned surgery including the date and location.  How do I manage my blood sugar before surgery? Check your blood sugar at least 4 times a day, starting 2 days before surgery, to make sure that the level is not too high or low.  Check your blood sugar the morning of your surgery when you wake up and every 2 hours until you get to the Short Stay unit.  If your blood sugar is less than 70 mg/dL, you will need to treat for low blood sugar: Do not take insulin. Treat a low blood sugar (less than 70 mg/dL) with  cup of clear juice (cranberry or apple), 4 glucose tablets, OR glucose gel. Recheck blood sugar in 15 minutes after treatment (to make sure it is greater than 70 mg/dL). If your blood sugar is not greater than 70 mg/dL on recheck, call [phone] for further instructions. Report your blood sugar to the short stay nurse when you get to  Short Stay.  If you are admitted to the hospital after surgery: Your blood sugar will be checked by the staff and you will probably be given insulin after surgery (instead of oral diabetes medicines) to make sure you have good blood sugar levels. The goal for blood sugar control after surgery is 80-180 mg/dL.           Do not wear jewelry  Do not wear lotions, powders, colognes, or deodorant. Do not shave 48 hours prior to surgery.  Men may shave face and neck. Do not bring valuables to the hospital.             [facility] is not responsible for any belongings or valuables.  Do NOT  Smoke (Tobacco/Vaping) or drink Alcohol 24 hours prior to your procedure If you use a CPAP at night, you may bring all equipment for your overnight stay.   Contacts, glasses, dentures or bridgework may not be worn into surgery, please bring cases for these belongings   For patients admitted to the hospital, discharge time will be determined by your treatment team.   Patients discharged the day of surgery will not be allowed to drive home, and someone needs to stay with them for 24 hours.  ONLY 1 SUPPORT PERSON MAY BE PRESENT WHILE YOU ARE IN SURGERY. IF YOU ARE TO BE ADMITTED ONCE YOU ARE IN YOUR ROOM YOU WILL BE ALLOWED TWO (2) VISITORS.  Minor children may have two parents present. Special consideration for safety and communication needs will be reviewed on a case by case basis.  Special instructions:    Oral Hygiene is also important to reduce your risk of infection.  Remember - BRUSH YOUR TEETH THE MORNING OF SURGERY WITH YOUR REGULAR TOOTHPASTE   - Preparing For Surgery  Before surgery, you can play an important role. Because skin is not sterile, your skin needs to be as free of germs as possible. You can reduce the number of germs on your skin by washing with CHG (chlorahexidine gluconate) Soap before surgery.  CHG is an antiseptic cleaner which kills germs and bonds with the skin to continue killing germs even after washing.     Please do not use if you have an allergy to CHG or antibacterial soaps. If your skin becomes reddened/irritated stop using the CHG.  Do not shave (including legs and underarms) for at least 48 hours prior to first CHG shower. It is OK to shave your face.  Please follow these instructions carefully.     Shower the NIGHT BEFORE SURGERY and the MORNING OF SURGERY with CHG Soap.   If you chose to wash your hair, wash your hair first as usual with your normal shampoo. After you shampoo, rinse your hair and body thoroughly to remove the shampoo.  Then  [employer] and genitals (private parts) with your normal soap and rinse thoroughly to remove soap.  After that Use CHG Soap as you would any other liquid soap. You can apply CHG directly to the skin and wash gently with a scrungie or a clean washcloth.   Apply the CHG Soap to your body ONLY FROM THE NECK DOWN.  Do not use on open wounds or open sores. Avoid contact with your eyes, ears, mouth and genitals (private parts). Wash Face and genitals (private parts)  with your normal soap.   Wash thoroughly, paying special attention to the area where your surgery will be performed.  Thoroughly rinse your body with warm water  from the neck down.  DO NOT shower/wash with your normal soap after using and rinsing off the CHG Soap.  Pat yourself dry with a CLEAN TOWEL.  Wear CLEAN PAJAMAS to bed the night before surgery  Place CLEAN SHEETS on your bed the night before your surgery  DO NOT SLEEP WITH PETS.   Day of Surgery:  Take a shower with CHG soap. Wear Clean/Comfortable clothing the morning of surgery Do not apply any deodorants/lotions.   Remember to brush your teeth WITH YOUR REGULAR TOOTHPASTE.   Please read over the following fact sheets that you were given.

## 2020-12-29 ENCOUNTER — Other Ambulatory Visit: Payer: Self-pay

## 2020-12-29 LAB — SARS CORONAVIRUS 2 (TAT 6-24 HRS): SARS Coronavirus 2: NEGATIVE

## 2020-12-29 NOTE — Anesthesia Preprocedure Evaluation (Addendum)
Anesthesia Evaluation  Patient identified by MRN, date of birth, ID band Patient awake    Reviewed: Allergy & Precautions, NPO status , Patient's Chart, lab work & pertinent test results, reviewed documented beta blocker date and time   Airway Mallampati: IV  TM Distance: >3 FB Neck ROM: Full    Dental  (+) Edentulous Upper, Missing, Chipped, Dental Advisory Given,    Pulmonary shortness of breath and with exertion, former smoker,  Quit smoking 1993   Pulmonary exam normal breath sounds clear to auscultation       Cardiovascular hypertension, Pt. on medications and Pt. 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 Left ventricular ejection fraction, by estimation, is 50%. The left  ventricle has mildly decreased function. The left ventricle demonstrates  regional wall motion abnormalities with mid-apical inferoseptal and  anteroseptal mild hypokinesis. There is  mild left ventricular hypertrophy. Left ventricular diastolic parameters  are consistent with Grade I diastolic dysfunction (impaired relaxation).  2. Right ventricular systolic function is normal. The right ventricular  size is normal. Tricuspid regurgitation signal is inadequate for assessing  PA pressure.  3. The mitral valve is normal in structure. No evidence of mitral valve  regurgitation. No evidence of mitral stenosis.  4. The aortic valve is tricuspid. Aortic valve regurgitation is not  visualized. Mild aortic valve sclerosis is present, with no evidence of  aortic valve stenosis.  5. IVC not visualized.   Stress test 2021:  The left ventricular ejection fraction is normal (55-65%).  Nuclear stress EF: 65%.  There was no ST segment deviation noted during stress.  The study is normal.  This is a low risk  study.    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 Non-hemodynamically significant plaque <50% noted in the CCA. The ECA appears >50% stenosed.  Left Carotid: Velocities in the left ICA are consistent with a 40-59% stenosis.  negative psych ROS   GI/Hepatic negative GI ROS, Neg liver ROS,   Endo/Other  diabetes, Poorly Controlled, Type 2, Insulin DependentObesity BMI 36 a1c 8.8 FS 118 in preop this AM, no insulin today   Renal/GU Renal InsufficiencyRenal diseaseCr 2.13  negative genitourinary   Musculoskeletal negative musculoskeletal ROS (+)   Abdominal (+) + obese,   Peds  Hematology negative hematology ROS (+)   Anesthesia Other Findings   Reproductive/Obstetrics negative OB ROS                           Anesthesia Physical Anesthesia Plan  ASA: 3  Anesthesia Plan: General   Post-op Pain Management:    Induction: Intravenous  PONV Risk Score and Plan: 2 and Ondansetron, Dexamethasone and Treatment may vary due to age or medical condition  Airway Management Planned: Oral ETT  Additional Equipment: Arterial line  Intra-op Plan:   Post-operative Plan: Extubation in OR  Informed Consent: I have reviewed the patients History and Physical, chart, labs and discussed the procedure including the risks, benefits and alternatives for the proposed anesthesia with the patient or authorized representative who has indicated his/her understanding and acceptance.     Dental advisory given  Plan Discussed with: CRNA  Anesthesia Plan Comments:        Anesthesia Quick Evaluation

## 2020-12-29 NOTE — Progress Notes (Signed)
Anesthesia Chart Review:  Case: [id number] Date/Time: 12/31/20 1055   Procedure: RIGHT CAROTID ENDARTERECTOMY (Right)   Anesthesia type: General   Pre-op diagnosis: BILATERAL CAROTID STENOSIS   Location: [facility] OR ROOM 7 / [place] [facility]   Surgeons: Serafina Mitchell, MD       DISCUSSION: Patient is a 80 year old male scheduled for the above procedure.  History includes former smoker (quit 05/18/1991), DM2, HTN, CAD (anterior MI, s/p coronary atherectomy of pLAD 10/1992), PAD, dyspnea, carotid artery stenosis (see below). OSA screening is elevated at 5. BMI is consistent with obesity.  Cardiology follow-up on 12/17/20 with Coletta Memos, NP. Patient thad low risk stress test 01/2020. Following visit, he wrote, "...Given past medical history and time since last visit, based on ACC/AHA guidelines, Eric Choi would be at acceptable risk for the planned procedure without further cardiovascular testing.    His RCRI is a class IV risk, 11% risk of major cardiac event.  He is able to complete greater than 4 METS of physical activity.   His aspirin may be held for 5-7 days prior to his surgery.  Please resume as soon as hemostasis is achieved..."  Continue aspirin per [facility]. 12/29/20 presurgical COVID-19 test negative. Anesthesia team to evaluate on the day of surgery.    VS: BP (!) 152/58   Pulse 68   Temp 36.5 C   Resp 19   Ht _0  (1.676 m)   Wt 103.1 kg   SpO2 99%   BMI 36.67 kg/m    PROVIDERS: [name], NP is PCP Murvin Natal, MD is cardiologist Benjiman Core, MD is endocrinologist. Last visit 08/26/20.    LABS: Preoperative labs noted. Cr 2.13, which is consistent and slightly improved when compared to labs in [facility] fro m4/19/21-6/15/22 (Cr 2.70-3.1). A1c 8.8%, up from 8.3% on 12/26/20--result routed to Dr. Trula Slade. Patient reported fasting CBGs ~ 100-200. (all labs ordered are listed, but only abnormal results are displayed)  Labs Reviewed  GLUCOSE, CAPILLARY - Abnormal;  Notable for the following components:      Result Value   Glucose-Capillary 267 (*)    All other components within normal limits  CBC - Abnormal; Notable for the following components:   WBC 13.8 (*)    All other components within normal limits  COMPREHENSIVE METABOLIC PANEL - Abnormal; Notable for the following components:   Sodium 134 (*)    CO2 20 (*)    Glucose, Bld 217 (*)    BUN 28 (*)    Creatinine, Ser 2.13 (*)    GFR, Estimated 31 (*)    All other components within normal limits  URINALYSIS, ROUTINE W REFLEX MICROSCOPIC - Abnormal; Notable for the following components:   Glucose, UA >=500 (*)    Hgb urine dipstick SMALL (*)    Protein, ur 30 (*)    All other components within normal limits  HEMOGLOBIN A1C - Abnormal; Notable for the following components:   Hgb A1c MFr Bld 8.8 (*)    All other components within normal limits  SURGICAL PCR SCREEN  PROTIME-INR  APTT  TYPE AND SCREEN    EKG: 12/17/20: SR with occasional PVCs.   CV: US Carotid 12/08/20: Summary:  Right Carotid: Velocities in the right ICA are consistent with a 80-99%                 stenosis. Non-hemodynamically significant plaque <50% noted  in  the CCA. The ECA appears >50% stenosed.   Left Carotid: Velocities in the left ICA are consistent with a 40-59%  stenosis.   Vertebrals:  Left vertebral artery demonstrates antegrade flow. Right  vertebral               artery was not visualized.  Subclavians: Normal flow hemodynamics were seen in bilateral subclavian               arteries.    US Renal 10/01/20: IMPRESSION: 1. Limited evaluation due to body habitus. 2. Minimally complex right renal cyst, with a single thin septation identified. 3. Multiple simple left renal cysts. 4. Normal renal cortical echotexture.   Echo 02/06/20: IMPRESSIONS   1. Left ventricular ejection fraction, by estimation, is 50%. The left  ventricle has mildly decreased function. The left ventricle  demonstrates  regional wall motion abnormalities with mid-apical inferoseptal and  anteroseptal mild hypokinesis. There is  mild left ventricular hypertrophy. Left ventricular diastolic parameters  are consistent with Grade I diastolic dysfunction (impaired relaxation).   2. Right ventricular systolic function is normal. The right ventricular  size is normal. Tricuspid regurgitation signal is inadequate for assessing  PA pressure.   3. The mitral valve is normal in structure. No evidence of mitral valve  regurgitation. No evidence of mitral stenosis.   4. The aortic valve is tricuspid. Aortic valve regurgitation is not  visualized. Mild aortic valve sclerosis is present, with no evidence of  aortic valve stenosis.   5. IVC not visualized.    Nuclear stress test 02/06/20: The left ventricular ejection fraction is normal (55-65%). Nuclear stress EF: 65%. There was no ST segment deviation noted during stress. The study is normal. This is a low risk study. Low risk stress nuclear study with normal perfusion and normal left ventricular regional and global systolic function.    Per 08/31/06 cardiology note by Dr. 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  At the time of his cardiac  catheterization, he was noted to have a nondominant RCA.  His left  circumflex showed no significant disease.  His ventriculography at that  time showed severe hypokinesis of the anterolateral wall and apex.  No  ejection fraction was given on the report I have in his chart."   Past Medical History:  Diagnosis Date   CAD (coronary artery disease)    Colon polyp    DIABETES MELLITUS, TYPE II 11/03/2006   Dyspnea    HYPERLIPIDEMIA 11/03/2006   HYPERTENSION 11/03/2006   Myocardial infarction [facility])    PAD (peripheral artery disease) (HCC)     Past Surgical History:  Procedure Laterality Date    ANGIOPLASTY  20 years ago   percutaneous transluminal    CATARACT EXTRACTION  2002, 2005   bilateral    MEDICATIONS:  acetaminophen (TYLENOL) 500 MG tablet   amLODipine (NORVASC) 10 MG tablet   aspirin 81 MG tablet   Bismuth Subsalicylate (PEPTO-BISMOL) 262 MG TABS   Blood Glucose Monitoring Suppl (ONETOUCH VERIO) w/Device KIT   ezetimibe (ZETIA) 10 MG tablet   gemfibrozil (LOPID) 600 MG tablet   insulin NPH Human (NOVOLIN N) 100 UNIT/ML injection   insulin regular (NOVOLIN R) 100 units/mL injection   Lancets Misc. (ACCU-CHEK MULTICLIX LANCET DEV) KIT   lisinopril (ZESTRIL) 30 MG tablet   METAMUCIL FIBER PO   metoprolol tartrate (LOPRESSOR) 50 MG tablet   NEEDLE, DISP, 30 G 30G  X 1" MISC   ONETOUCH VERIO test strip   simvastatin (ZOCOR) 40 MG tablet   No current facility-administered medications for this encounter.    Myra Gianotti, PA-C Surgical Short Stay/Anesthesiology [facility] Phone [phone] [facility] Phone [phone] 12/29/2020 4:40 PM

## 2020-12-31 ENCOUNTER — Other Ambulatory Visit: Payer: Self-pay

## 2020-12-31 ENCOUNTER — Inpatient Hospital Stay (HOSPITAL_COMMUNITY)
Admission: RE | Admit: 2020-12-31 | Discharge: 2021-01-01 | DRG: 039 | Disposition: A | Discharge status: Home / Self Care | Payer: HMO | Attending: Surgery | Admitting: Surgery

## 2020-12-31 ENCOUNTER — Encounter (HOSPITAL_COMMUNITY)
Admission: RE | Disposition: A | Discharge status: Home / Self Care | Payer: Self-pay | Source: Home / Self Care | Attending: Surgery

## 2020-12-31 ENCOUNTER — Inpatient Hospital Stay (HOSPITAL_COMMUNITY): Payer: HMO | Admitting: Vascular Surgery

## 2020-12-31 ENCOUNTER — Inpatient Hospital Stay (HOSPITAL_COMMUNITY): Payer: HMO | Admitting: Anesthesiology

## 2020-12-31 ENCOUNTER — Encounter (HOSPITAL_COMMUNITY): Payer: Self-pay | Admitting: Surgery

## 2020-12-31 DIAGNOSIS — I252 Old myocardial infarction: Secondary | ICD-10-CM

## 2020-12-31 DIAGNOSIS — Z79899 Other long term (current) drug therapy: Secondary | ICD-10-CM | POA: Diagnosis not present

## 2020-12-31 DIAGNOSIS — Z87891 Personal history of nicotine dependence: Secondary | ICD-10-CM | POA: Diagnosis not present

## 2020-12-31 DIAGNOSIS — Z6836 Body mass index (BMI) 36.0-36.9, adult: Secondary | ICD-10-CM

## 2020-12-31 DIAGNOSIS — E785 Hyperlipidemia, unspecified: Secondary | ICD-10-CM | POA: Diagnosis present

## 2020-12-31 DIAGNOSIS — I6521 Occlusion and stenosis of right carotid artery: Principal | ICD-10-CM | POA: Diagnosis present

## 2020-12-31 DIAGNOSIS — I6523 Occlusion and stenosis of bilateral carotid arteries: Secondary | ICD-10-CM | POA: Diagnosis not present

## 2020-12-31 DIAGNOSIS — Z794 Long term (current) use of insulin: Secondary | ICD-10-CM | POA: Diagnosis not present

## 2020-12-31 DIAGNOSIS — E781 Pure hyperglyceridemia: Secondary | ICD-10-CM | POA: Diagnosis not present

## 2020-12-31 DIAGNOSIS — E1151 Type 2 diabetes mellitus with diabetic peripheral angiopathy without gangrene: Secondary | ICD-10-CM | POA: Diagnosis present

## 2020-12-31 DIAGNOSIS — Z833 Family history of diabetes mellitus: Secondary | ICD-10-CM | POA: Diagnosis not present

## 2020-12-31 DIAGNOSIS — Z7982 Long term (current) use of aspirin: Secondary | ICD-10-CM | POA: Diagnosis not present

## 2020-12-31 DIAGNOSIS — I2510 Atherosclerotic heart disease of native coronary artery without angina pectoris: Secondary | ICD-10-CM | POA: Diagnosis present

## 2020-12-31 DIAGNOSIS — E669 Obesity, unspecified: Secondary | ICD-10-CM | POA: Diagnosis not present

## 2020-12-31 DIAGNOSIS — N400 Enlarged prostate without lower urinary tract symptoms: Secondary | ICD-10-CM | POA: Diagnosis present

## 2020-12-31 DIAGNOSIS — I10 Essential (primary) hypertension: Secondary | ICD-10-CM | POA: Diagnosis not present

## 2020-12-31 DIAGNOSIS — E113393 Type 2 diabetes mellitus with moderate nonproliferative diabetic retinopathy without macular edema, bilateral: Secondary | ICD-10-CM | POA: Diagnosis not present

## 2020-12-31 DIAGNOSIS — E1159 Type 2 diabetes mellitus with other circulatory complications: Secondary | ICD-10-CM | POA: Diagnosis not present

## 2020-12-31 HISTORY — PX: PATCH ANGIOPLASTY: SHX6230

## 2020-12-31 HISTORY — PX: ENDARTERECTOMY: SHX5162

## 2020-12-31 LAB — CBC
HCT: 36.6 % — ABNORMAL LOW (ref 39.0–52.0)
Hemoglobin: 12.2 g/dL — ABNORMAL LOW (ref 13.0–17.0)
MCH: 30.2 pg (ref 26.0–34.0)
MCHC: 33.3 g/dL (ref 30.0–36.0)
MCV: 90.6 fL (ref 80.0–100.0)
Platelets: 254 10*3/uL (ref 150–400)
RBC: 4.04 MIL/uL — ABNORMAL LOW (ref 4.22–5.81)
RDW: 14.2 % (ref 11.5–15.5)
WBC: 14.6 10*3/uL — ABNORMAL HIGH (ref 4.0–10.5)
nRBC: 0 % (ref 0.0–0.2)

## 2020-12-31 LAB — CREATININE, SERUM
Creatinine, Ser: 2.15 mg/dL — ABNORMAL HIGH (ref 0.61–1.24)
GFR, Estimated: 31 mL/min — ABNORMAL LOW (ref >60)

## 2020-12-31 LAB — ABO/RH: ABO/RH(D): A POS

## 2020-12-31 LAB — GLUCOSE, CAPILLARY
Glucose-Capillary: 118 mg/dL — ABNORMAL HIGH (ref 70–99)
Glucose-Capillary: 111 mg/dL — ABNORMAL HIGH (ref 70–99)
Glucose-Capillary: 143 mg/dL — ABNORMAL HIGH (ref 70–99)
Glucose-Capillary: 251 mg/dL — ABNORMAL HIGH (ref 70–99)

## 2020-12-31 LAB — POCT ACTIVATED CLOTTING TIME
Activated Clotting Time: 237 seconds
Activated Clotting Time: 208 seconds

## 2020-12-31 SURGERY — ENDARTERECTOMY, CAROTID
Anesthesia: General | Site: Neck | Laterality: Right

## 2020-12-31 MED ORDER — ALUM & MAG HYDROXIDE-SIMETH 200-200-20 MG/5ML PO SUSP: 15.0000 mL | ORAL | Status: DC | PRN

## 2020-12-31 MED ORDER — ACETAMINOPHEN 650 MG RE SUPP: 325.0000 mg | RECTAL | Status: DC | PRN

## 2020-12-31 MED ORDER — ALBUMIN HUMAN 5 % IV SOLN: 12.5000 g | Freq: Once | INTRAVENOUS | Status: DC

## 2020-12-31 MED ORDER — POTASSIUM CHLORIDE CRYS ER 20 MEQ PO TBCR: 20.0000 meq | EXTENDED_RELEASE_TABLET | Freq: Every day | ORAL | Status: DC | PRN

## 2020-12-31 MED ORDER — MAGNESIUM SULFATE 2 GM/50ML IV SOLN: 2.0000 g | Freq: Every day | INTRAVENOUS | Status: DC | PRN

## 2020-12-31 MED ORDER — LACTATED RINGERS IV SOLN: INTRAVENOUS | Status: DC

## 2020-12-31 MED ORDER — BISACODYL 5 MG PO TBEC: 5.0000 mg | DELAYED_RELEASE_TABLET | Freq: Every day | ORAL | Status: DC | PRN

## 2020-12-31 MED ORDER — CEFAZOLIN SODIUM-DEXTROSE 2-4 GM/100ML-% IV SOLN
2.0000 g | INTRAVENOUS | Status: AC
  Administered 2020-12-31: 2 g via INTRAVENOUS

## 2020-12-31 MED ORDER — 0.9 % SODIUM CHLORIDE (POUR BTL) OPTIME
TOPICAL | Status: DC | PRN
  Administered 2020-12-31: 1000 mL

## 2020-12-31 MED ORDER — ALBUMIN HUMAN 5 % IV SOLN
INTRAVENOUS | Status: AC
  Filled 2020-12-31: qty 500

## 2020-12-31 MED ORDER — DOCUSATE SODIUM 100 MG PO CAPS
100.0000 mg | ORAL_CAPSULE | Freq: Every day | ORAL | Status: DC
  Filled 2020-12-31: qty 1

## 2020-12-31 MED ORDER — FENTANYL CITRATE (PF) 250 MCG/5ML IJ SOLN
INTRAMUSCULAR | Status: AC
  Filled 2020-12-31: qty 5

## 2020-12-31 MED ORDER — HEPARIN SODIUM (PORCINE) 5000 UNIT/ML IJ SOLN
5000.0000 [IU] | Freq: Three times a day (TID) | INTRAMUSCULAR | Status: DC
  Administered 2021-01-01: 5000 [IU] via SUBCUTANEOUS
  Filled 2020-12-31: qty 1

## 2020-12-31 MED ORDER — HEMOSTATIC AGENTS (NO CHARGE) OPTIME
TOPICAL | Status: DC | PRN
  Administered 2020-12-31: 1 via TOPICAL

## 2020-12-31 MED ORDER — INSULIN ASPART 100 UNIT/ML IJ SOLN: 15.0000 [IU] | Freq: Three times a day (TID) | INTRAMUSCULAR | Status: DC

## 2020-12-31 MED ORDER — SUGAMMADEX SODIUM 200 MG/2ML IV SOLN
INTRAVENOUS | Status: DC | PRN
  Administered 2020-12-31: 400 mg via INTRAVENOUS

## 2020-12-31 MED ORDER — METOPROLOL TARTRATE 5 MG/5ML IV SOLN: 2.0000 mg | INTRAVENOUS | Status: DC | PRN

## 2020-12-31 MED ORDER — PANTOPRAZOLE SODIUM 40 MG PO TBEC
40.0000 mg | DELAYED_RELEASE_TABLET | Freq: Every day | ORAL | Status: DC
  Administered 2020-12-31 – 2021-01-01 (×2): 40 mg via ORAL
  Filled 2020-12-31 (×2): qty 1

## 2020-12-31 MED ORDER — ONDANSETRON HCL 4 MG/2ML IJ SOLN
INTRAMUSCULAR | Status: DC | PRN
  Administered 2020-12-31: 4 mg via INTRAVENOUS

## 2020-12-31 MED ORDER — ASPIRIN EC 81 MG PO TBEC
81.0000 mg | DELAYED_RELEASE_TABLET | Freq: Every day | ORAL | Status: DC
  Administered 2021-01-01: 81 mg via ORAL
  Filled 2020-12-31: qty 1

## 2020-12-31 MED ORDER — ACETAMINOPHEN 10 MG/ML IV SOLN
INTRAVENOUS | Status: DC | PRN
  Administered 2020-12-31: 1000 mg via INTRAVENOUS

## 2020-12-31 MED ORDER — AMLODIPINE BESYLATE 10 MG PO TABS
10.0000 mg | ORAL_TABLET | Freq: Every day | ORAL | Status: DC
  Administered 2021-01-01: 10 mg via ORAL
  Filled 2020-12-31: qty 1

## 2020-12-31 MED ORDER — EZETIMIBE 10 MG PO TABS
10.0000 mg | ORAL_TABLET | Freq: Every day | ORAL | Status: DC
  Administered 2021-01-01: 10 mg via ORAL
  Filled 2020-12-31: qty 1

## 2020-12-31 MED ORDER — HEPARIN 6000 UNIT IRRIGATION SOLUTION
Status: DC | PRN
  Administered 2020-12-31: 1

## 2020-12-31 MED ORDER — SODIUM CHLORIDE 0.9 % IV SOLN
INTRAVENOUS | Status: DC | PRN
  Administered 2020-12-31: 30 ug/min via INTRAVENOUS

## 2020-12-31 MED ORDER — DEXAMETHASONE SODIUM PHOSPHATE 10 MG/ML IJ SOLN
INTRAMUSCULAR | Status: DC | PRN
  Administered 2020-12-31: 4 mg via INTRAVENOUS

## 2020-12-31 MED ORDER — LABETALOL HCL 5 MG/ML IV SOLN: 10.0000 mg | INTRAVENOUS | Status: DC | PRN

## 2020-12-31 MED ORDER — SODIUM CHLORIDE 0.9 % IV SOLN: INTRAVENOUS | Status: DC

## 2020-12-31 MED ORDER — METOPROLOL TARTRATE 50 MG PO TABS
50.0000 mg | ORAL_TABLET | Freq: Every day | ORAL | Status: DC
  Administered 2021-01-01: 50 mg via ORAL
  Filled 2020-12-31: qty 2

## 2020-12-31 MED ORDER — VASOPRESSIN 20 UNIT/ML IV SOLN
INTRAVENOUS | Status: AC
  Filled 2020-12-31: qty 1

## 2020-12-31 MED ORDER — ONDANSETRON HCL 4 MG/2ML IJ SOLN: 4.0000 mg | Freq: Once | INTRAMUSCULAR | Status: DC | PRN

## 2020-12-31 MED ORDER — CEFAZOLIN SODIUM-DEXTROSE 2-4 GM/100ML-% IV SOLN
INTRAVENOUS | Status: AC
  Filled 2020-12-31: qty 100

## 2020-12-31 MED ORDER — HYDROMORPHONE HCL 1 MG/ML IJ SOLN: 0.2500 mg | INTRAMUSCULAR | Status: DC | PRN

## 2020-12-31 MED ORDER — SIMVASTATIN 20 MG PO TABS
40.0000 mg | ORAL_TABLET | Freq: Every day | ORAL | Status: DC
  Administered 2020-12-31: 40 mg via ORAL
  Filled 2020-12-31: qty 2

## 2020-12-31 MED ORDER — POLYETHYLENE GLYCOL 3350 17 G PO PACK: 17.0000 g | PACK | Freq: Every day | ORAL | Status: DC | PRN

## 2020-12-31 MED ORDER — OXYCODONE-ACETAMINOPHEN 5-325 MG PO TABS: 1.0000 | ORAL_TABLET | ORAL | Status: DC | PRN

## 2020-12-31 MED ORDER — PSYLLIUM 51.7 % PO PACK: PACK | Freq: Every day | ORAL | Status: DC

## 2020-12-31 MED ORDER — ACETAMINOPHEN 325 MG PO TABS: 325.0000 mg | ORAL_TABLET | ORAL | Status: DC | PRN

## 2020-12-31 MED ORDER — PSYLLIUM 95 % PO PACK
1.0000 | PACK | Freq: Every day | ORAL | Status: DC
  Administered 2020-12-31: 1 via ORAL
  Filled 2020-12-31 (×2): qty 1

## 2020-12-31 MED ORDER — MORPHINE SULFATE (PF) 2 MG/ML IV SOLN: 2.0000 mg | INTRAVENOUS | Status: DC | PRN

## 2020-12-31 MED ORDER — ACETAMINOPHEN 10 MG/ML IV SOLN
INTRAVENOUS | Status: AC
  Filled 2020-12-31: qty 100

## 2020-12-31 MED ORDER — OXYCODONE HCL 5 MG PO TABS: 5.0000 mg | ORAL_TABLET | Freq: Once | ORAL | Status: DC | PRN

## 2020-12-31 MED ORDER — GLYCOPYRROLATE PF 0.2 MG/ML IJ SOSY
PREFILLED_SYRINGE | INTRAMUSCULAR | Status: DC | PRN
  Administered 2020-12-31: .2 mg via INTRAVENOUS

## 2020-12-31 MED ORDER — HEPARIN SODIUM (PORCINE) 1000 UNIT/ML IJ SOLN
INTRAMUSCULAR | Status: DC | PRN
  Administered 2020-12-31 (×2): 1000 [IU] via INTRAVENOUS
  Administered 2020-12-31: 10000 [IU] via INTRAVENOUS

## 2020-12-31 MED ORDER — CEFAZOLIN SODIUM-DEXTROSE 2-4 GM/100ML-% IV SOLN
2.0000 g | INTRAVENOUS | Status: AC
  Administered 2021-01-01: 2 g via INTRAVENOUS
  Filled 2020-12-31: qty 100

## 2020-12-31 MED ORDER — ONDANSETRON HCL 4 MG/2ML IJ SOLN: 4.0000 mg | Freq: Four times a day (QID) | INTRAMUSCULAR | Status: DC | PRN

## 2020-12-31 MED ORDER — PHENYLEPHRINE HCL (PRESSORS) 10 MG/ML IV SOLN
INTRAVENOUS | Status: DC | PRN
  Administered 2020-12-31 (×2): 80 ug via INTRAVENOUS
  Administered 2020-12-31: 240 ug via INTRAVENOUS
  Administered 2020-12-31: 40 ug via INTRAVENOUS
  Administered 2020-12-31: 80 ug via INTRAVENOUS
  Administered 2020-12-31: 240 ug via INTRAVENOUS
  Administered 2020-12-31: 120 ug via INTRAVENOUS

## 2020-12-31 MED ORDER — ALBUMIN HUMAN 5 % IV SOLN
12.5000 g | Freq: Once | INTRAVENOUS | Status: AC
  Administered 2020-12-31: 12.5 g via INTRAVENOUS

## 2020-12-31 MED ORDER — ORAL CARE MOUTH RINSE: 15.0000 mL | Freq: Once | OROMUCOSAL | Status: AC

## 2020-12-31 MED ORDER — LIDOCAINE HCL (PF) 1 % IJ SOLN
INTRAMUSCULAR | Status: AC
  Filled 2020-12-31: qty 5

## 2020-12-31 MED ORDER — SODIUM CHLORIDE 0.9 % IV SOLN
INTRAVENOUS | Status: DC
Start: 2020-12-31 — End: 2020-12-31

## 2020-12-31 MED ORDER — GUAIFENESIN-DM 100-10 MG/5ML PO SYRP: 15.0000 mL | ORAL_SOLUTION | ORAL | Status: DC | PRN

## 2020-12-31 MED ORDER — CHLORHEXIDINE GLUCONATE 0.12 % MT SOLN
OROMUCOSAL | Status: AC
  Administered 2020-12-31: 15 mL via OROMUCOSAL
  Filled 2020-12-31: qty 15

## 2020-12-31 MED ORDER — HYDRALAZINE HCL 20 MG/ML IJ SOLN: 5.0000 mg | INTRAMUSCULAR | Status: DC | PRN

## 2020-12-31 MED ORDER — GEMFIBROZIL 600 MG PO TABS
600.0000 mg | ORAL_TABLET | Freq: Every day | ORAL | Status: DC
  Administered 2021-01-01: 600 mg via ORAL
  Filled 2020-12-31: qty 1

## 2020-12-31 MED ORDER — INSULIN ASPART 100 UNIT/ML IJ SOLN: 0.0000 [IU] | Freq: Three times a day (TID) | INTRAMUSCULAR | Status: DC

## 2020-12-31 MED ORDER — MIDAZOLAM HCL 2 MG/2ML IJ SOLN
INTRAMUSCULAR | Status: AC
  Filled 2020-12-31: qty 2

## 2020-12-31 MED ORDER — SODIUM CHLORIDE 0.9 % IV SOLN: 0.0000 ug/min | INTRAVENOUS | Status: DC

## 2020-12-31 MED ORDER — LACTATED RINGERS IV SOLN: INTRAVENOUS | Status: DC | PRN

## 2020-12-31 MED ORDER — CHLORHEXIDINE GLUCONATE CLOTH 2 % EX PADS: 6.0000 | MEDICATED_PAD | Freq: Once | CUTANEOUS | Status: DC

## 2020-12-31 MED ORDER — PHENOL 1.4 % MT LIQD: 1.0000 | OROMUCOSAL | Status: DC | PRN

## 2020-12-31 MED ORDER — ALBUMIN HUMAN 5 % IV SOLN
25.0000 g | Freq: Once | INTRAVENOUS | Status: AC
  Administered 2020-12-31: 25 g via INTRAVENOUS

## 2020-12-31 MED ORDER — LISINOPRIL 10 MG PO TABS
30.0000 mg | ORAL_TABLET | Freq: Every day | ORAL | Status: DC
  Administered 2021-01-01: 30 mg via ORAL
  Filled 2020-12-31: qty 3

## 2020-12-31 MED ORDER — PROTAMINE SULFATE 10 MG/ML IV SOLN
INTRAVENOUS | Status: DC | PRN
  Administered 2020-12-31: 50 mg via INTRAVENOUS

## 2020-12-31 MED ORDER — FENTANYL CITRATE (PF) 100 MCG/2ML IJ SOLN
INTRAMUSCULAR | Status: DC | PRN
  Administered 2020-12-31 (×4): 50 ug via INTRAVENOUS

## 2020-12-31 MED ORDER — EPHEDRINE SULFATE 50 MG/ML IJ SOLN
INTRAMUSCULAR | Status: DC | PRN
  Administered 2020-12-31 (×3): 5 mg via INTRAVENOUS

## 2020-12-31 MED ORDER — MIDAZOLAM HCL 5 MG/5ML IJ SOLN
INTRAMUSCULAR | Status: DC | PRN
  Administered 2020-12-31: 1 mg via INTRAVENOUS

## 2020-12-31 MED ORDER — SODIUM CHLORIDE 0.9 % IV SOLN: 500.0000 mL | Freq: Once | INTRAVENOUS | Status: DC | PRN

## 2020-12-31 MED ORDER — INSULIN ASPART 100 UNIT/ML IJ SOLN
15.0000 [IU] | Freq: Three times a day (TID) | INTRAMUSCULAR | Status: DC
  Administered 2021-01-01: 26 [IU] via SUBCUTANEOUS

## 2020-12-31 MED ORDER — LIDOCAINE 2% (20 MG/ML) 5 ML SYRINGE
INTRAMUSCULAR | Status: DC | PRN
  Administered 2020-12-31: 60 mg via INTRAVENOUS

## 2020-12-31 MED ORDER — PROPOFOL 10 MG/ML IV BOLUS
INTRAVENOUS | Status: DC | PRN
  Administered 2020-12-31: 30 mg via INTRAVENOUS
  Administered 2020-12-31: 100 mg via INTRAVENOUS

## 2020-12-31 MED ORDER — CHLORHEXIDINE GLUCONATE 0.12 % MT SOLN: 15.0000 mL | Freq: Once | OROMUCOSAL | Status: AC

## 2020-12-31 MED ORDER — HEPARIN 6000 UNIT IRRIGATION SOLUTION
Status: AC
  Filled 2020-12-31: qty 500

## 2020-12-31 MED ORDER — ROCURONIUM BROMIDE 10 MG/ML (PF) SYRINGE
PREFILLED_SYRINGE | INTRAVENOUS | Status: DC | PRN
  Administered 2020-12-31: 10 mg via INTRAVENOUS
  Administered 2020-12-31: 20 mg via INTRAVENOUS
  Administered 2020-12-31: 80 mg via INTRAVENOUS

## 2020-12-31 MED ORDER — OXYCODONE HCL 5 MG/5ML PO SOLN: 5.0000 mg | Freq: Once | ORAL | Status: DC | PRN

## 2020-12-31 SURGICAL SUPPLY — 52 items
ADH SKN CLS APL DERMABOND .7 (GAUZE/BANDAGES/DRESSINGS) ×1
AGENT HMST KT MTR STRL THRMB (HEMOSTASIS) ×1
BAG COUNTER SPONGE SURGICOUNT (BAG) ×2 IMPLANT
BAG SPNG CNTER NS LX DISP (BAG) ×1
CANISTER SUCT 3000ML PPV (MISCELLANEOUS) ×2 IMPLANT
CATH ROBINSON RED A/P 18FR (CATHETERS) ×2 IMPLANT
CATH SUCT 10FR WHISTLE TIP (CATHETERS) ×2 IMPLANT
CLIP VESOCCLUDE MED 6/CT (CLIP) ×2 IMPLANT
CLIP VESOCCLUDE SM WIDE 6/CT (CLIP) ×2 IMPLANT
COVER PROBE W GEL 5X96 (DRAPES) IMPLANT
DERMABOND ADVANCED (GAUZE/BANDAGES/DRESSINGS) ×1
DERMABOND ADVANCED .7 DNX12 (GAUZE/BANDAGES/DRESSINGS) ×1 IMPLANT
DRAIN CHANNEL 15F RND FF W/TCR (WOUND CARE) IMPLANT
ELECT REM PT RETURN 9FT ADLT (ELECTROSURGICAL) ×2
ELECTRODE REM PT RTRN 9FT ADLT (ELECTROSURGICAL) ×1 IMPLANT
EVACUATOR SILICONE 100CC (DRAIN) IMPLANT
GAUZE 4X4 16PLY ~~LOC~~+RFID DBL (SPONGE) ×1 IMPLANT
GLOVE SURG POLYISO LF SZ7.5 (GLOVE) ×2 IMPLANT
GLOVE SURG UNDER POLY LF SZ7.5 (GLOVE) ×2 IMPLANT
GOWN STRL REUS W/ TWL LRG LVL3 (GOWN DISPOSABLE) ×2 IMPLANT
GOWN STRL REUS W/ TWL XL LVL3 (GOWN DISPOSABLE) ×1 IMPLANT
GOWN STRL REUS W/TWL LRG LVL3 (GOWN DISPOSABLE) ×4
GOWN STRL REUS W/TWL XL LVL3 (GOWN DISPOSABLE) ×2
HEMOSTAT SNOW SURGICEL 2X4 (HEMOSTASIS) IMPLANT
INSERT FOGARTY SM (MISCELLANEOUS) IMPLANT
KIT BASIN OR (CUSTOM PROCEDURE TRAY) ×2 IMPLANT
KIT SHUNT ARGYLE CAROTID ART 6 (VASCULAR PRODUCTS) IMPLANT
KIT TURNOVER KIT B (KITS) ×2 IMPLANT
NDL HYPO 25GX1X1/2 BEV (NEEDLE) IMPLANT
NEEDLE HYPO 25GX1X1/2 BEV (NEEDLE) IMPLANT
NS IRRIG 1000ML POUR BTL (IV SOLUTION) ×6 IMPLANT
PACK CAROTID (CUSTOM PROCEDURE TRAY) ×2 IMPLANT
PAD ARMBOARD 7.5X6 YLW CONV (MISCELLANEOUS) ×4 IMPLANT
PATCH VASC XENOSURE 1CMX6CM (Vascular Products) ×2 IMPLANT
PATCH VASC XENOSURE 1X6 (Vascular Products) IMPLANT
POSITIONER HEAD DONUT 9IN (MISCELLANEOUS) ×2 IMPLANT
SET WALTER ACTIVATION W/DRAPE (SET/KITS/TRAYS/PACK) ×2 IMPLANT
SHUNT CAROTID BYPASS 10 (VASCULAR PRODUCTS) IMPLANT
SHUNT CAROTID BYPASS 12FRX15.5 (VASCULAR PRODUCTS) IMPLANT
SPONGE T-LAP 18X18 ~~LOC~~+RFID (SPONGE) ×2 IMPLANT
SURGIFLO W/THROMBIN 8M KIT (HEMOSTASIS) ×1 IMPLANT
SUT ETHILON 3 0 PS 1 (SUTURE) IMPLANT
SUT PROLENE 6 0 BV (SUTURE) ×5 IMPLANT
SUT PROLENE 7 0 BV1 MDA (SUTURE) ×1 IMPLANT
SUT SILK 3 0 (SUTURE)
SUT SILK 3-0 18XBRD TIE 12 (SUTURE) IMPLANT
SUT VIC AB 3-0 SH 27 (SUTURE) ×4
SUT VIC AB 3-0 SH 27X BRD (SUTURE) ×2 IMPLANT
SUT VIC AB 3-0 X1 27 (SUTURE) ×2 IMPLANT
SYR CONTROL 10ML LL (SYRINGE) IMPLANT
TOWEL GREEN STERILE (TOWEL DISPOSABLE) ×2 IMPLANT
WATER STERILE IRR 1000ML POUR (IV SOLUTION) ×2 IMPLANT

## 2020-12-31 NOTE — Interval H&P Note (Signed)
History and Physical Interval Note:  12/31/2020 10:45 AM  Eric Choi  has presented today for surgery, with the diagnosis of BILATERAL CAROTID STENOSIS.  The various methods of treatment have been discussed with the patient and family. After consideration of risks, benefits and other options for treatment, the patient has consented to  Procedure(s): RIGHT CAROTID ENDARTERECTOMY (Right) as a surgical intervention.  The patient's history has been reviewed, patient examined, no change in status, stable for surgery.  I have reviewed the patient's chart and labs.  Questions were answered to the patient's satisfaction.     [name]

## 2020-12-31 NOTE — Transfer of Care (Signed)
Immediate Anesthesia Transfer of Care Note  Patient: Eric Choi  Procedure(s) Performed: RIGHT CAROTID ENDARTERECTOMY (Right: Neck) PATCH ANGIOPLASTY OF RIGHT CAROTID ARTERY USING XENOSURE BOVINE PERICARDIUM PATCH (Right: Neck)  Patient Location: PACU  Anesthesia Type:General  Level of Consciousness: awake, alert , oriented and patient cooperative  Airway & Oxygen Therapy: Patient Spontanous Breathing  Post-op Assessment: Report given to RN, Post -op Vital signs reviewed and stable and Patient moving all extremities X 4  Post vital signs: Reviewed and stable  Last Vitals:  Vitals Value Taken Time  BP 122/73 12/31/20 1421  Temp    Pulse 66 12/31/20 1424  Resp 12 12/31/20 1424  SpO2 93 % 12/31/20 1424  Vitals shown include unvalidated device data.  Last Pain:  Vitals:   12/31/20 0927  TempSrc:   PainSc: 0-No pain         Complications: No notable events documented.

## 2020-12-31 NOTE — Anesthesia Procedure Notes (Signed)
Procedure Name: Intubation Date/Time: 12/31/2020 11:32 AM Performed by: Annamary Carolin, CRNA Pre-anesthesia Checklist: Patient identified, Emergency Drugs available, Suction available and Patient being monitored Patient Re-evaluated:Patient Re-evaluated prior to induction Oxygen Delivery Method: Circle System Utilized Preoxygenation: Pre-oxygenation with 100% oxygen Induction Type: IV induction Ventilation: Mask ventilation without difficulty Laryngoscope Size: Mac and 4 Grade View: Grade I Tube type: Oral Tube size: 7.5 mm Number of attempts: 1 Airway Equipment and Method: Stylet and Oral airway Placement Confirmation: ETT inserted through vocal cords under direct vision, positive ETCO2 and breath sounds checked- equal and bilateral Secured at: 24 cm Tube secured with: Tape Dental Injury: Teeth and Oropharynx as per pre-operative assessment

## 2020-12-31 NOTE — Progress Notes (Signed)
PHARMACY NOTE:  ANTIMICROBIAL RENAL DOSAGE ADJUSTMENT  Current antimicrobial regimen includes a mismatch between antimicrobial dosage and estimated renal function.  As per policy approved by the Pharmacy & Therapeutics and Medical Executive Committees, the antimicrobial dosage will be adjusted accordingly.  Current antimicrobial dosage:  Cefazolin 2g q8 hr  Indication: surgical prophylaxis  Renal Function:  Estimated Creatinine Clearance: 31.7 mL/min (A) (by C-G formula based on SCr of 2.15 mg/dL (H)).   Antimicrobial dosage has been changed to:  cefazolin 2 g q24 hr   Additional comments:   Thank you for allowing pharmacy to be a part of this patient's care.  Benetta Spar, PharmD, BCPS, BCCP Clinical Pharmacist  Please check AMION for all [place] phone numbers After 10:00 PM, call [place] [phone]

## 2020-12-31 NOTE — Anesthesia Procedure Notes (Signed)
Arterial Line Insertion Start/End8/17/2022 9:40 AM Performed by: Carolan Clines, CRNA, CRNA  Patient location: Pre-op. Preanesthetic checklist: patient identified, IV checked, site marked, risks and benefits discussed, surgical consent, monitors and equipment checked, pre-op evaluation, timeout performed and anesthesia consent Lidocaine 1% used for infiltration Left, radial was placed Catheter size: 20 G Hand hygiene performed  and maximum sterile barriers used   Attempts: 1 Procedure performed without using ultrasound guided technique. Following insertion, dressing applied and Biopatch. Post procedure assessment: normal and unchanged  Patient tolerated the procedure well with no immediate complications.

## 2020-12-31 NOTE — Progress Notes (Signed)
  Day of Surgery Note    Subjective:  Neck sore otherwise comfortable   Vitals:   12/31/20 1521 12/31/20 1536  BP: (!) 109/50 (!) 119/50  Pulse: 74 70  Resp: 19 16  Temp:    SpO2: 95% 96%    General appearance: Awake, alert in no apparent distress Neurologic: Alert and oriented x4, tongue midline, face symmetric, grip strength 5/5 bilaterally Cardiac: Heart rate and rhythm are regular Respirations: Nonlabored Incision: Well approximated without bleeding or hematoma.  Subcutaneous tissue soft to palpation   Assessment/Plan:  This is a 80 y.o. male who is s/p right CEA for asymptomatic right ICA stenosis. Neuro intact. Hypotension currently on neo titrated infusion at 15 mics with sys BP in 120s (cuff) and 140s (A-line)  -Risa Grill, PA-C 12/31/2020 3:53 PM [phone]

## 2020-12-31 NOTE — Anesthesia Postprocedure Evaluation (Signed)
Anesthesia Post Note  Patient: Eric Choi  Procedure(s) Performed: RIGHT CAROTID ENDARTERECTOMY (Right: Neck) PATCH ANGIOPLASTY OF RIGHT CAROTID ARTERY USING XENOSURE BOVINE PERICARDIUM PATCH (Right: Neck)     Patient location during evaluation: PACU Anesthesia Type: General Level of consciousness: awake and alert, oriented and patient cooperative Pain management: pain level controlled Vital Signs Assessment: post-procedure vital signs reviewed and stable Respiratory status: spontaneous breathing, nonlabored ventilation and respiratory function stable Cardiovascular status: blood pressure returned to baseline and stable Postop Assessment: no apparent nausea or vomiting Anesthetic complications: no   No notable events documented.  Last Vitals:  Vitals:   12/31/20 1502 12/31/20 1506  BP: (!) 119/52 (!) 121/52  Pulse: 73 72  Resp: 13 15  Temp:    SpO2: 96% 96%    Last Pain:  Vitals:   12/31/20 1430  TempSrc:   PainSc: 0-No pain                 Pervis Hocking

## 2020-12-31 NOTE — Op Note (Signed)
Patient name: Eric Choi MRN: [id number] DOB: 1940/07/08 Sex: male  12/31/2020 Pre-operative Diagnosis: Asymptomatic   right carotid stenosis Post-operative diagnosis:  Same Surgeon:  [name] Assistants:  Risa Grill, PA Procedure:    right carotid Endarterectomy with bovine pericardial patch angioplasty Anesthesia:  General Blood Loss:  150 cc Specimens:  None  Findings:  90 %stenosis; Thrombus:  none  Indications: This is a 80 year old gentleman with claudication who was found to have a carotid bruit which led to an ultrasound showing greater than 80% right carotid stenosis which is asymptomatic.  He comes in today for endarterectomy.  Procedure:  The patient was identified in the holding area and taken to [facility] [facility]  The patient was then placed supine on the table.   General endotrachial anesthesia was administered.  The patient was prepped and draped in the usual sterile fashion.  A time out was called and antibiotics were administered.  A PA was necessary to expedite the procedure and assist with technical details  The incision was made along the anterior border of the right sternocleidomastoid muscle.  Cautery was used to dissect through the subcutaneous tissue.  The platysma muscle was divided with cautery.  The internal jugular vein was exposed along its anterior medial border.  The common facial vein was exposed and then divided between 2-0 silk ties and metal clips.  The common carotid artery was then circumferentially exposed and encircled with an umbilical tape.  The vagus nerve was identified and protected.  Next sharp dissection was used to expose the external carotid artery and the superior thyroid artery.  The were encircled with a blue vessel loop and a 2-0 silk tie respectively.  Finally, the internal carotid was carefully dissected free.  An umbilical tape was placed around the internal carotid artery distal to the diseased segment.  The hypoglossal nerve was  visualized throughout and protected.  The patient was given systemic heparinization.  A bovine carotid patch was selected and prepared on the back table.  A 10 french shunt was also prepared.  After blood pressure readings were appropriate and the heparin had been given time to circulate, the internal carotid artery was occluded with a baby Gregory clamp.  The external and common carotid arteries were then occluded with vascular clamps and the 2-0 tie tightened on the superior thyroid artery.  A #11 blade was used to make an arteriotomy in the common carotid artery.  This was extended with Potts scissors along the anterior and lateral border of the common and internal carotid artery.  Approximately 90% stenosis was identified.  There was no thrombus identified.  The 10 french shunt was not placed, due to the patient's anatomy as well as adequate backbleeding..  A kleiner kuntz elevator was used to perform endarterectomy.  An eversion endarterectomy was performed in the external carotid artery.  A good distal endpoint was obtained in the internal carotid artery.  The specimen was removed and sent to pathology.  Heparinized saline was used to irrigate the endarterectomized field.  All potential embolic debris was removed.  Bovine pericardial patch angioplasty was then performed using a running 6-0 Prolene. The common internal and external carotid arteries were all appropriately flushed. The artery was again irrigated with heparin saline.  The anastomosis was then secured. The clamp was first released on the external carotid artery followed by the common carotid artery approximately 30 seconds later, bloodflow was reestablish through the internal carotid artery.  Next, a hand-held  Doppler was used to evaluate the signals in the common, external, and internal  carotid arteries, all of which had appropriate signals. I then administered  50 mg protamine. The wound was then irrigated.  After hemostasis was achieved,  the carotid sheath was reapproximated with 3-0 Vicryl. The  platysma muscle was reapproximated with running 3-0 Vicryl. The skin  was closed with 4-0 Vicryl. Dermabond was placed on the skin. The  patient was then successfully extubated. His neurologic exam was  similar to his preprocedural exam. The patient was then taken to recovery room  in stable condition. There were no complications.     Disposition:  To PACU in stable condition.  Relevant Operative Details: 90% exophytic plaque beginning at the carotid bifurcation extending up approximately 1 cm.  I did not place a shunt because of the patient's challenging anatomy as well as adequate backbleeding.  A bovine pericardial patch was utilized.  Theotis Burrow, M.D., [facility] [place] Office: [phone] Pager:  [phone]

## 2020-12-31 NOTE — Interval H&P Note (Signed)
History and Physical Interval Note:  12/31/2020 10:55 AM  Eric Choi  has presented today for surgery, with the diagnosis of BILATERAL CAROTID STENOSIS.  The various methods of treatment have been discussed with the patient and family. After consideration of risks, benefits and other options for treatment, the patient has consented to  Procedure(s): RIGHT CAROTID ENDARTERECTOMY (Right) as a surgical intervention.  The patient's history has been reviewed, patient examined, no change in status, stable for surgery.  I have reviewed the patient's chart and labs.  Questions were answered to the patient's satisfaction.     [name]

## 2021-01-01 ENCOUNTER — Encounter (INDEPENDENT_AMBULATORY_CARE_PROVIDER_SITE_OTHER): Payer: HMO | Admitting: Ophthalmology

## 2021-01-01 ENCOUNTER — Encounter (HOSPITAL_COMMUNITY): Payer: Self-pay | Admitting: Surgery

## 2021-01-01 LAB — BASIC METABOLIC PANEL
Anion gap: 13 (ref 5–15)
BUN: 33 mg/dL — ABNORMAL HIGH (ref 8–23)
CO2: 16 mmol/L — ABNORMAL LOW (ref 22–32)
Calcium: 8.4 mg/dL — ABNORMAL LOW (ref 8.9–10.3)
Chloride: 103 mmol/L (ref 98–111)
Creatinine, Ser: 2.58 mg/dL — ABNORMAL HIGH (ref 0.61–1.24)
GFR, Estimated: 25 mL/min — ABNORMAL LOW (ref >60)
Glucose, Bld: 287 mg/dL — ABNORMAL HIGH (ref 70–99)
Potassium: 4.8 mmol/L (ref 3.5–5.1)
Sodium: 132 mmol/L — ABNORMAL LOW (ref 135–145)

## 2021-01-01 LAB — CBC
HCT: 34.6 % — ABNORMAL LOW (ref 39.0–52.0)
Hemoglobin: 11 g/dL — ABNORMAL LOW (ref 13.0–17.0)
MCH: 29.6 pg (ref 26.0–34.0)
MCHC: 31.8 g/dL (ref 30.0–36.0)
MCV: 93.3 fL (ref 80.0–100.0)
Platelets: 278 10*3/uL (ref 150–400)
RBC: 3.71 MIL/uL — ABNORMAL LOW (ref 4.22–5.81)
RDW: 14.4 % (ref 11.5–15.5)
WBC: 16.3 10*3/uL — ABNORMAL HIGH (ref 4.0–10.5)
nRBC: 0 % (ref 0.0–0.2)

## 2021-01-01 LAB — GLUCOSE, CAPILLARY
Glucose-Capillary: 306 mg/dL — ABNORMAL HIGH (ref 70–99)
Glucose-Capillary: 274 mg/dL — ABNORMAL HIGH (ref 70–99)

## 2021-01-01 LAB — LIPID PANEL
Cholesterol: 99 mg/dL (ref 0–200)
HDL: 28 mg/dL — ABNORMAL LOW (ref >40)
LDL Cholesterol: 30 mg/dL (ref 0–99)
Total CHOL/HDL Ratio: 3.5 RATIO
Triglycerides: 203 mg/dL — ABNORMAL HIGH (ref <150)
VLDL: 41 mg/dL — ABNORMAL HIGH (ref 0–40)

## 2021-01-01 MED ORDER — ATORVASTATIN CALCIUM 40 MG PO TABS: 40.0000 mg | ORAL_TABLET | Freq: Every day | ORAL | Status: DC

## 2021-01-01 MED ORDER — ATORVASTATIN CALCIUM 40 MG PO TABS: 40.0000 mg | ORAL_TABLET | Freq: Every day | ORAL | 4 refills | Status: DC

## 2021-01-01 MED ORDER — HYDROCODONE-ACETAMINOPHEN 5-325 MG PO TABS: 1.0000 | ORAL_TABLET | ORAL | 0 refills | Status: AC | PRN

## 2021-01-01 NOTE — Progress Notes (Signed)
Pt's bladder scan was 580 cc, pt has not voided since arrival to the unit. Pt walked to the bathroom without any difficulty, urinated. Bladder scan post void was 0cc. Will continue to monitor.

## 2021-01-01 NOTE — Progress Notes (Signed)
Patient given discharge instructions with wife.  Reviewed medications and at home care and instructions.  Patient has taken all their belongings, and off tele. CCMD notified.  Vitals taken before discharge and stable.

## 2021-01-01 NOTE — Discharge Instructions (Signed)
   [facility]  Discharge Instructions   Carotid Endarterectomy (CEA)  Please refer to the following instructions for your post-procedure care. Your surgeon or physician assistant will discuss any changes with you.  Activity  You are encouraged to walk as much as you can. You can slowly return to normal activities but must avoid strenuous activity and heavy lifting until your doctor tell you it's OK. Avoid activities such as vacuuming or swinging a golf club. You can drive after one week if you are comfortable and you are no longer taking prescription pain medications. It is normal to feel tired for serval weeks after your surgery. It is also normal to have difficulty with sleep habits, eating, and bowel movements after surgery. These will go away with time.  Bathing/Showering  You may shower after you come home. Do not soak in a bathtub, hot tub, or swim until the incision heals completely.  Incision Care  Shower every day. Clean your incision with mild soap and water. Pat the area dry with a clean towel. You do not need a bandage unless otherwise instructed. Do not apply any ointments or creams to your incision. You may have skin glue on your incision. Do not peel it off. It will come off on its own in about one week. Your incision may feel thickened and raised for several weeks after your surgery. This is normal and the skin will soften over time. For Men Only: It's OK to shave around the incision but do not shave the incision itself for 2 weeks. It is common to have numbness under your chin that could last for several months.  Diet  Resume your normal diet. There are no special food restrictions following this procedure. A low fat/low cholesterol diet is recommended for all patients with vascular disease. In order to heal from your surgery, it is CRITICAL to get adequate nutrition. Your body requires vitamins, minerals, and protein. Vegetables are the best  source of vitamins and minerals. Vegetables also provide the perfect balance of protein. Processed food has little nutritional value, so try to avoid this.        Medications  Resume taking all of your medications unless your doctor or physician assistant tells you not to. If your incision is causing pain, you may take over-the- counter pain relievers such as acetaminophen (Tylenol). If you were prescribed a stronger pain medication, please be aware these medications can cause nausea and constipation. Prevent nausea by taking the medication with a snack or meal. Avoid constipation by drinking plenty of fluids and eating foods with a high amount of fiber, such as fruits, vegetables, and grains. Do not take Tylenol if you are taking prescription pain medications.  Follow Up  Our office will schedule a follow up appointment 2-3 weeks following discharge.  Please call us immediately for any of the following conditions  Increased pain, redness, drainage (pus) from your incision site. Fever of 101 degrees or higher. If you should develop stroke (slurred speech, difficulty swallowing, weakness on one side of your body, loss of vision) you should call 911 and go to the nearest emergency room.  Reduce your risk of vascular disease:  Stop smoking. If you would like help call [facility] at 1-800-QUIT-NOW (1-800-784-8669) or [place] at 336-586-4000. Manage your cholesterol Maintain a desired weight Control your diabetes Keep your blood pressure down  If you have any questions, please call the office at [phone].   

## 2021-01-01 NOTE — Progress Notes (Signed)
PHARMACIST LIPID MONITORING   Eric Choi is a 80 y.o. male admitted on 12/31/2020 with bilateral carotid stenosis.  Pharmacy has been consulted to optimize lipid-lowering therapy with the indication of secondary prevention for clinical ASCVD.  Recent Labs:  Lipid Panel (last 6 months):   Lab Results  Component Value Date   CHOL 99 01/01/2021   TRIG 203 (H) 01/01/2021   HDL 28 (L) 01/01/2021   CHOLHDL 3.5 01/01/2021   VLDL 41 (H) 01/01/2021   LDLCALC 30 01/01/2021    Hepatic function panel (last 6 months):   Lab Results  Component Value Date   AST 21 12/26/2020   ALT 25 12/26/2020   ALKPHOS 42 12/26/2020   BILITOT 0.5 12/26/2020    SCr (since admission):   Serum creatinine: 2.58 mg/dL (H) 01/01/21 0540 Estimated creatinine clearance: 26.4 mL/min (A)  Current therapy and lipid therapy tolerance Current lipid-lowering therapy:  - ezetimibe 10mg  daily - gemfibrozil 600mg  daily - simvastatin 40mg  daily  Previous lipid-lowering therapies (if applicable):  N/A  Documented or reported allergies or intolerances to lipid-lowering therapies (if applicable):  N/A  Assessment:   Patient agrees with changes to lipid-lowering therapy  Plan:    1.Statin intensity (high intensity recommended for all patients regardless of the LDL):  Add or increase statin to high intensity.  - Change simvastatin 40mg  to atorvastatin 20mg  daily  2.Add ezetimibe (if any one of the following):    - already prescribed ezetimibe  3.Refer to lipid clinic:   No  4.Follow-up with:  Primary care provider - [name], NP  5.Follow-up labs after discharge:  Changes in lipid therapy were made. Check a lipid panel in 8-12 weeks then annually.       Mignon Pine, PharmD 01/01/2021, 10:41 AM

## 2021-01-01 NOTE — Progress Notes (Addendum)
  Progress Note    01/01/2021 8:34 AM 1 Day Post-Op  Subjective:  No complaints except SCDs kept him from sleeping. Did not require further BP support. MAP upper 60s-low 70s Wife at bedside.  Vitals:   01/01/21 0534 01/01/21 0748  BP:  (!) 116/55  Pulse:  89  Resp:  18  Temp: 98.7 F (37.1 C) (!) 97.5 F (36.4 C)  SpO2:  97%    Physical Exam: General appearance: Awake, alert in no apparent distress Neurologic: Alert and oriented x4, tongue midline, face symmetric, grip strength 5/5 bilaterally Cardiac: Heart rate and rhythm are regular Respirations: Nonlabored Incision: Well approximated without bleeding or hematoma.  Subcutaneous tissue soft to palpation  CBC    Component Value Date/Time   WBC 16.3 (H) 01/01/2021 0540   RBC 3.71 (L) 01/01/2021 0540   HGB 11.0 (L) 01/01/2021 0540   HCT 34.6 (L) 01/01/2021 0540   PLT 278 01/01/2021 0540   MCV 93.3 01/01/2021 0540   MCH 29.6 01/01/2021 0540   MCHC 31.8 01/01/2021 0540   RDW 14.4 01/01/2021 0540   LYMPHSABS 2.5 06/11/2020 0932   MONOABS 1.0 06/11/2020 0932   EOSABS 0.6 06/11/2020 0932   BASOSABS 0.1 06/11/2020 0932    BMET    Component Value Date/Time   NA 132 (L) 01/01/2021 0540   NA 137 10/29/2020 0000   K 4.8 01/01/2021 0540   CL 103 01/01/2021 0540   CO2 16 (L) 01/01/2021 0540   GLUCOSE 287 (H) 01/01/2021 0540   BUN 33 (H) 01/01/2021 0540   CREATININE 2.58 (H) 01/01/2021 0540   CALCIUM 8.4 (L) 01/01/2021 0540   GFRNONAA 25 (L) 01/01/2021 0540     Intake/Output Summary (Last 24 hours) at 01/01/2021 0834 Last data filed at 01/01/2021 0100 Gross per 24 hour  Intake 1630.39 ml  Output 20 ml  Net 1610.39 ml    HOSPITAL MEDICATIONS Scheduled Meds:  amLODipine  10 mg Oral Daily   aspirin EC  81 mg Oral Daily   docusate sodium  100 mg Oral Daily   ezetimibe  10 mg Oral Daily   gemfibrozil  600 mg Oral QAC breakfast   heparin  5,000 Units Subcutaneous Q8H   insulin aspart  15-30 Units Subcutaneous  TID WC   lisinopril  30 mg Oral Daily   metoprolol tartrate  50 mg Oral Daily   pantoprazole  40 mg Oral Daily   psyllium  1 packet Oral Daily   simvastatin  40 mg Oral QHS   Continuous Infusions:  sodium chloride     sodium chloride Stopped (01/01/21 0530)   magnesium sulfate bolus IVPB     PRN Meds:.sodium chloride, acetaminophen **OR** acetaminophen, alum & mag hydroxide-simeth, bisacodyl, guaiFENesin-dextromethorphan, hydrALAZINE, labetalol, magnesium sulfate bolus IVPB, metoprolol tartrate, morphine injection, ondansetron, oxyCODONE-acetaminophen, phenol, polyethylene glycol, potassium chloride  Assessment and Plan: This is a 80 y.o. male who is s/p right CEA for asymptomatic right ICA stenosis. Hemodynamically stable. Neuro intact. Stable for discharge home on aspirin, statin. Follow-up in 2 weeks.  -DVT prophylaxis:  heparin [place]; SCDs   Risa Grill, PA-C [facility] [phone] 01/01/2021  8:34 AM   I agree with the above.  The patient is postop day 1 status post right carotid endarterectomy.  He is neurologically intact.  He can go home saphenous  [name]

## 2021-01-01 NOTE — Plan of Care (Signed)

## 2021-01-02 ENCOUNTER — Other Ambulatory Visit (HOSPITAL_COMMUNITY): Payer: Self-pay

## 2021-01-02 ENCOUNTER — Telehealth: Payer: Self-pay

## 2021-01-02 NOTE — Telephone Encounter (Signed)
Received a fax from [facility] stating that Pt was in the ED 12/31/2020. I f/u with pt to check on status. Pt stated he is doing good and stated that he does not need to see Tommi Rumps unless Tommi Rumps would like to see him.    Please advise

## 2021-01-02 NOTE — Telephone Encounter (Signed)
Patient notified of update  and verbalized understanding. 

## 2021-01-05 ENCOUNTER — Other Ambulatory Visit: Payer: Self-pay

## 2021-01-05 ENCOUNTER — Telehealth: Payer: Self-pay

## 2021-01-05 ENCOUNTER — Encounter: Payer: Self-pay | Admitting: Internal Medicine

## 2021-01-05 ENCOUNTER — Other Ambulatory Visit (HOSPITAL_COMMUNITY): Payer: Self-pay

## 2021-01-05 ENCOUNTER — Ambulatory Visit (INDEPENDENT_AMBULATORY_CARE_PROVIDER_SITE_OTHER): Payer: HMO | Admitting: Internal Medicine

## 2021-01-05 VITALS — BP 128/62 | HR 72 | Ht 66.0 in | Wt 231.0 lb

## 2021-01-05 DIAGNOSIS — E1165 Type 2 diabetes mellitus with hyperglycemia: Secondary | ICD-10-CM | POA: Diagnosis not present

## 2021-01-05 DIAGNOSIS — Z6834 Body mass index (BMI) 34.0-34.9, adult: Secondary | ICD-10-CM | POA: Diagnosis not present

## 2021-01-05 DIAGNOSIS — E785 Hyperlipidemia, unspecified: Secondary | ICD-10-CM

## 2021-01-05 DIAGNOSIS — E1159 Type 2 diabetes mellitus with other circulatory complications: Secondary | ICD-10-CM

## 2021-01-05 DIAGNOSIS — IMO0002 Reserved for concepts with insufficient information to code with codable children: Secondary | ICD-10-CM

## 2021-01-05 DIAGNOSIS — E669 Obesity, unspecified: Secondary | ICD-10-CM | POA: Diagnosis not present

## 2021-01-05 DIAGNOSIS — Z794 Long term (current) use of insulin: Secondary | ICD-10-CM

## 2021-01-05 DIAGNOSIS — R7989 Other specified abnormal findings of blood chemistry: Secondary | ICD-10-CM | POA: Diagnosis not present

## 2021-01-05 NOTE — Telephone Encounter (Signed)
Refilled request was faxed over. Per chart Rx has already been taking care of.

## 2021-01-05 NOTE — Progress Notes (Signed)
Patient ID: Eric Choi, male   DOB: August 08, 1940, 80 y.o.   MRN: [id number]  This visit occurred during the SARS-CoV-2 public health emergency.  Safety protocols were in place, including screening questions prior to the visit, additional usage of staff PPE, and extensive cleaning of exam room while observing appropriate contact time as indicated for disinfecting solutions.  HPI: Eric Choi is a 80 y.o.-year-old male, returning for f/u for DM2 dx 1990, insulin-dependent since 2005, uncontrolled, with complications (CAD, CKD, PN, DR, PVD). Last visit was 4 months ago.  Interim history: Since last visit, he had right carotid endarterectomy 12/31/2020. He continues to have claudication. No increased urination, blurry vision, nausea, chest pain.  Reviewed HbA1c levels: Lab Results  Component Value Date   HGBA1C 8.8 (H) 12/26/2020   HGBA1C 8.3 (A) 08/26/2020   HGBA1C 8.4 (A) 04/17/2020   He is on: Insulin Before breakfast Before lunch Before dinner Bedtime  Regular 30-35 units  not using 15-20 units 30-35 units -  NPH 45 >> 40 units     55 units   - Ozempic 1 mg weekly - Pt assistance pgm >> still did not get it  We tried U500 insulin but this was too expensive. He did not start Trulicity due to price - 145$.  Pt checks his sugars 3-6 times a day per review of his meter downloads from the last 2 weeks: - am: 103-203, 248 >> 77-170, 223 >> 52 x1, 96-246 >> 42, 73-164, 192, 200 - 1-2h after b'fast: 182, 251 >> n/c >> 264, 444 >> 90-200 >> n/c - before lunch: 58, 60-122, 224 >> 56, 65, 93-105 >> 101-244 - 2h after lunch: 136-188 >> n/c >> n/c >> 266 >> 203 >> 221 - before dinner: 123, 141, 229 >> 51, 160-195, 313 >> 110-172 >> 78-221, 332 - 1-2h after dinner:  150-211 >> 139, 194 >> n/c >> 169-273 - bedtime: 131 >> 72-140, 190 >> n/c >> 60-204 >> 126-242 - nighttime:> n/c >> 68, 77-232 >> 122, 144, 155 >> 185-248 Lowest CBG: 55 >> 52 >> 42 x1; he has hypoglycemia awareness in the  90s. Highest CBG: 444 >> 300 x1 >> 332.  Pt's meals are: - Breakfast: cereal + milk + eggs + bacon/sausage - Lunch: egg sandwich - Dinner: meat + veggies + starch - Snacks: 1-2: including after dinner; pears + mayonnaise, milk + crackers; cake  -+ CKD, last BUN/creatinine:  Lab Results  Component Value Date   BUN 33 (H) 01/01/2021   CREATININE 2.58 (H) 01/01/2021  On lisinopril.  -+ HL; last set of lipids: Lab Results  Component Value Date   CHOL 99 01/01/2021   HDL 28 (L) 01/01/2021   LDLCALC 30 01/01/2021   LDLDIRECT 69.0 09/05/2020   TRIG 203 (H) 01/01/2021   CHOLHDL 3.5 01/01/2021  On Zocor 40, fenofibrate 145.    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 Dr. Radene Ou.  He has an appointment coming up.  - + numbness and tingling in his feet.  He also has HTN.  Latest TSH was slightly elevated: Lab Results  Component Value Date   TSH 3.67 08/26/2020   He lost his twin brother to cancer 05/2018.  ROS: see HPI  Neurological: no tremors/+ numbness/+ tingling/no dizziness  I reviewed pt's medications, allergies, PMH, social hx, family hx, and changes were documented in the history of present illness. Otherwise, unchanged from my initial visit note.  Past Medical History:  Diagnosis Date   CAD (coronary artery disease)    Colon polyp    DIABETES MELLITUS, TYPE II 11/03/2006   Dyspnea    HYPERLIPIDEMIA 11/03/2006   HYPERTENSION 11/03/2006   Myocardial infarction [facility])    PAD (peripheral artery disease) (HCC)    Past Surgical History:  Procedure Laterality Date   ANGIOPLASTY  20 years ago   percutaneous transluminal    CATARACT EXTRACTION  2002, 2005   bilateral   ENDARTERECTOMY Right 12/31/2020   Procedure: RIGHT CAROTID ENDARTERECTOMY;  Surgeon: Serafina Mitchell, MD;  Location: [facility] OR;  Service: Vascular;  Laterality: Right;   PATCH ANGIOPLASTY Right 12/31/2020   Procedure: PATCH ANGIOPLASTY OF RIGHT CAROTID ARTERY USING  XENOSURE BOVINE PERICARDIUM PATCH;  Surgeon: Serafina Mitchell, MD;  Location: [facility] OR;  Service: Vascular;  Laterality: Right;   Social History   Socioeconomic History   Marital status: Married    Spouse name: Not on file   Number of children: 2   Years of education: Not on file   Highest education level: Not on file  Occupational History   Occupation: Retired-Truck driver  Tobacco Use   Smoking status: Former    Types: Cigarettes    Quit date: 05/18/1991    Years since quitting: 29.6   Smokeless tobacco: Never  Vaping Use   Vaping Use: Never used  Substance and Sexual Activity   Alcohol use: No   Drug use: No   Sexual activity: Not on file  Other Topics Concern   Not on file  Social History Narrative   Works 3rd shift   Regular Exercise-yes   Social Determinants of Radio broadcast assistant Strain: Low Risk    Difficulty of Paying Living Expenses: Not hard at all  Food Insecurity: No Food Insecurity   Worried About Charity fundraiser in the Last Year: Never true   Arboriculturist in the Last Year: Never true  Transportation Needs: No Transportation Needs   Lack of Transportation (Medical): No   Lack of Transportation (Non-Medical): No  Physical Activity: Not on file  Stress: Not on file  Social Connections: Not on file  Intimate Partner Violence: Not on file   Current Outpatient Medications on File Prior to Visit  Medication Sig Dispense Refill   acetaminophen (TYLENOL) 500 MG tablet Take 500 mg by mouth every 6 (six) hours as needed for moderate pain.     amLODipine (NORVASC) 10 MG tablet TAKE ONE TABLET BY MOUTH DAILY 90 tablet 3   aspirin 81 MG tablet Take 81 mg by mouth daily.     atorvastatin (LIPITOR) 40 MG tablet Take 1 tablet (40 mg total) by mouth daily. 90 tablet 4   Bismuth Subsalicylate (PEPTO-BISMOL) 262 MG TABS Take 524 mg by mouth daily as needed (indigestion).     Blood Glucose Monitoring Suppl (ONETOUCH VERIO) w/Device KIT 1 each by Does not apply  route 4 (four) times daily. 1 kit 0   ezetimibe (ZETIA) 10 MG tablet TAKE ONE TABLET BY MOUTH DAILY 90 tablet 1   gemfibrozil (LOPID) 600 MG tablet Take 1 tablet (600 mg total) by mouth daily at 2 PM. 90 tablet 0   HYDROcodone-acetaminophen (NORCO/VICODIN) 5-325 MG tablet Take 1 tablet by mouth every 4 (four) hours as needed for moderate pain. 15 tablet 0   insulin NPH Human (NOVOLIN N) 100 UNIT/ML injection INJECT 45 - 55 UNITS UNDER THE SKIN BEFORE BREAKFAST AND DINNER 30 mL 11   insulin regular (  NOVOLIN R) 100 units/mL injection INJECT 15-30 UNITS THREE TIMES DAILY BEFORE MEALS AS DIRECTED 30 mL 11   Lancets Misc. (ACCU-CHEK MULTICLIX LANCET DEV) KIT Use to check sugar 4 times daily 400 each 5   lisinopril (ZESTRIL) 30 MG tablet TAKE ONE TABLET BY MOUTH DAILY 90 tablet 3   METAMUCIL FIBER PO Take 1 Dose by mouth daily. 1 dose= 1 tablespoon     metoprolol tartrate (LOPRESSOR) 50 MG tablet TAKE ONE TABLET BY MOUTH DAILY 90 tablet 3   NEEDLE, DISP, 30 G 30G X 1" MISC by Does not apply route as directed.     ONETOUCH VERIO test strip USE TO TEST BLOOD SUGAR 4 TIMES DAILY AS NEEDED 400 strip 10   No current facility-administered medications on file prior to visit.   No Known Allergies Family History  Problem Relation Age of Onset   COPD Father    Alcohol abuse Father    Diabetes Father    Cancer Sister        breast   Stroke Mother    Colon cancer Neg Hx     PE: BP 128/62 (BP Location: Right Arm, Patient Position: Sitting, Cuff Size: Normal)   Pulse 72   Ht 5' 6"  (1.676 m)   Wt 231 lb (104.8 kg)   SpO2 96%   BMI 37.28 kg/m  Body mass index is 37.28 kg/m.  Wt Readings from Last 3 Encounters:  01/05/21 231 lb (104.8 kg)  12/31/20 225 lb (102.1 kg)  12/26/20 227 lb 3.2 oz (103.1 kg)   Constitutional: overweight, in NAD Eyes: PERRLA, EOMI, no exophthalmos ENT: moist mucous membranes, no thyromegaly, no cervical lymphadenopathy Cardiovascular: RRR, No MRG Respiratory: CTA  B Gastrointestinal: abdomen soft, NT, ND, BS+ Musculoskeletal: no deformities, strength intact in all 4 Skin: moist, warm, no rashes Neurological: no tremor with outstretched hands, DTR normal in all 4  ASSESSMENT: 1. DM2, uncontrolled, insulin-dep, with complications - CAD - CKD - PN - DR  - Tried to obtain U500 insulin but this was too expensive >> we had to go back to N and R insulin  2. HL  3.  Obesity class 1  4.  Elevated TSH  PLAN:  1. Patient with longstanding, uncontrolled, type 2 diabetes, on basal-bolus insulin regimen with NPH and regular insulin due to price.  He was also on Ozempic before, which he was getting through the patient assistance program.  However, he had to come off after not receiving the shipment and at last visit I advised him to restart since sugars were still elevated.  However, they were better in the week prior to our last appointment.  At that time, I also advised him to use a lower dose of regular insulin in the morning when he plans to be active because he had sugars in the 50s-60s midday after taking the full dose.  We also decreased his NPH dose in the morning.  I also gave him a list of suppliers for CGM, as he appeared to be interested in getting one. -He had a recent HbA1c that was higher, at 8.8% 10 days ago.  This is increased from 8.3%. -At his visit, he mentioned that he was not able to start on Ozempic.  He was apparently contacted by the patient assistance from [place] but he forgot what was the reason why he could not obtain it.  Unfortunately, we cannot call the patient assistance program for the patient so for today, we discussed about starting a  new application for PAP for the 1 mg dose of Ozempic and we also gave him an Ozempic sample pen to start at 0.5 mg weekly.  I explained that we would absolutely need him on this not only for his diabetes but also for cardiovascular and renal status. -At today's visit, he tells me that he is taking the  same dose of regular in the morning as he was not very active lately.  I did advise him that it is active, to try to decrease the dose to avoid low blood sugars.  He had a low blood sugar, at 42 since last visit, in the morning. -She also did not obtain the CGM as he did not call the suppliers.  I again advised him that this is quite important and gave him the telephone numbers again.  Explained advantages to getting the CGM. -Reviewing his meter download, sugars are quite fluctuating, we have many values after close to goal but occasional hyperglycemic spikes.  This pattern can be seen with missing insulin injections.  Upon questioning, he does miss some. -I advised him to: Patient Instructions  Please restart - Ozempic 0.5 mg weekly x4 weekly, then increase to 1 mg weekly  Also: Insulin Before breakfast Before lunch Before dinner Bedtime  Regular 30-35 units (when you are active, use 20-25 units) 15-20 units 30-35 units -  NPH 40 units     55 units   If you do have a snack at night, may need to cover this with 5-10 units of regular insulin (R).  The most common suppliers for the continuous glucose monitor (FreeStyle [place]) are:  [employer] [phone], extension [phone]  -[employer] [phone]  [employer] [place] [phone]    Please come back for a follow-up appointment in 3-4 months.   - advised to check sugars at different times of the day - 4x a day, rotating check times - advised for yearly eye exams >> he is not UTD but has an appointment coming up - return to clinic in 3-4 months   2. HL -Reviewed latest lipid panel from 12/2020: LDL excellent, low, HDL also low, triglycerides slightly high, much improved from last check, when they were in the 500s: Lab Results  Component Value Date   CHOL 99 01/01/2021   HDL 28 (L) 01/01/2021   LDLCALC 30 01/01/2021   LDLDIRECT 69.0 09/05/2020   TRIG 203 (H) 01/01/2021    CHOLHDL 3.5 01/01/2021  -He is on Zocor 40, fenofibrate 145.  He continues to be seen in the lipid clinic  3.  Obesity class 1 -Before the coronavirus pandemic, he was exercising at [employer], but he stopped exercising afterwards due to back pain and sciatica and also leg weakness -At last visit, I advised him to restart Ozempic, which should also help with weight loss -He lost 6 pounds before last visit and 6 pounds before the previous visit.  Weight up by 5 pounds since then  4.  Elevated TSH -He had a slightly elevated TSH in 01/22 however, we repeated his TFTs at last visit, in 08/2020 and they were normal -No symptoms of hypothyroidism -We will continue to keep an eye on this   Philemon Kingdom, MD PhD [facility]

## 2021-01-05 NOTE — Patient Instructions (Addendum)
Please restart - Ozempic 0.5 mg weekly x4 weekly, then increase to 1 mg weekly  Also: Insulin Before breakfast Before lunch Before dinner Bedtime  Regular 30-35 units (when you are active, use 20-25 units) 15-20 units 30-35 units -  NPH 40 units     55 units   If you do have a snack at night, may need to cover this with 5-10 units of regular insulin (R).  The most common suppliers for the continuous glucose monitor (FreeStyle [place]) are:  [employer] [phone], extension [phone]  -[employer] [phone]  [employer] [place] [phone]    Please come back for a follow-up appointment in 3-4 months.

## 2021-01-06 ENCOUNTER — Telehealth: Payer: Self-pay | Admitting: Internal Medicine

## 2021-01-06 NOTE — Discharge Summary (Signed)
Carotid Discharge Summary     Eric Choi May 07, 1941 80 y.o. male  [id number]  Admission Date: 12/31/2020  Discharge Date: 01/01/2021  Physician: Dr. 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  The pt tolerated the procedure well and was transported to the PACU in good condition.  He required low-dose Neo-Synephrine for blood pressure support.  This was able to be weaned prior to transfer to the progressive care unit.  The patient was neurologically intact.   By POD 1, the pt neuro status intact.  He required no further blood pressure support.  He was voiding spontaneously and able to ambulate without lightheadedness or chest pain.  Minimal incisional pain.  No headache.  The remainder of the hospital course consisted of increasing mobilization and increasing intake of solids without difficulty.   No results for input(s): NA, K, CL, CO2, GLUCOSE, BUN, CALCIUM in the last 72 hours.  Invalid input(s): CRATININE No results for input(s): WBC, HGB, HCT, PLT in the last 72 hours. No results for input(s): INR in the last 72 hours.   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   Vitreomacular adhesion of  left eye 12/27/2019   Vitreomacular adhesion of right eye 12/27/2019   Obesity 10/19/2018   BPH associated with nocturia 03/12/2015   Dysplastic nevi 02/25/2014   Abdominal pain, left upper quadrant 07/26/2013   CAD (coronary artery disease) 10/01/2011   Uncontrolled type 2 diabetes mellitus with circulatory disorder, with long-term current use of insulin ([place]) 07/13/2010   FOOT PAIN, RIGHT 06/01/2007   Hyperlipidemia 11/03/2006   Essential hypertension 11/03/2006   Past Medical History:  Diagnosis Date   CAD (coronary artery disease)    Colon polyp    DIABETES MELLITUS, TYPE II 11/03/2006   Dyspnea    HYPERLIPIDEMIA 11/03/2006   HYPERTENSION 11/03/2006   Myocardial infarction [facility])    PAD (peripheral artery disease) ([facility])     Allergies as of 01/01/2021   No Known Allergies      Medication List     STOP taking these medications    simvastatin 40 MG tablet Commonly known as: ZOCOR       TAKE these medications    Accu-Chek Multiclix Lancet Dev Kit Use to check sugar 4 times daily   acetaminophen 500 MG tablet Commonly known as: TYLENOL Take 500 mg by mouth every 6 (six) hours as needed for moderate pain.   amLODipine 10 MG tablet Commonly known as: NORVASC TAKE ONE TABLET BY MOUTH DAILY   aspirin 81 MG tablet Take 81 mg by mouth daily.   atorvastatin 40 MG tablet Commonly known as: LIPITOR Take 1 tablet (40 mg total) by mouth daily.   ezetimibe 10 MG tablet Commonly known as: ZETIA TAKE  ONE TABLET BY MOUTH DAILY   gemfibrozil 600 MG tablet Commonly known as: LOPID Take 1 tablet (600 mg total) by mouth daily at 2 PM.   HYDROcodone-acetaminophen 5-325 MG tablet Commonly known as: NORCO/VICODIN Take 1 tablet by mouth every 4 (four) hours as needed for moderate pain.   lisinopril 30 MG tablet Commonly known as: ZESTRIL TAKE ONE TABLET BY MOUTH DAILY   METAMUCIL FIBER PO Take 1 Dose by mouth daily. 1 dose= 1 tablespoon   metoprolol tartrate 50 MG  tablet Commonly known as: LOPRESSOR TAKE ONE TABLET BY MOUTH DAILY   NEEDLE (DISP) 30 G 30G X 1" Misc by Does not apply route as directed.   NovoLIN N 100 UNIT/ML injection Generic drug: insulin NPH Human INJECT 45 - 55 UNITS UNDER THE SKIN BEFORE BREAKFAST AND DINNER   NovoLIN R 100 units/mL injection Generic drug: insulin regular INJECT 15-30 UNITS THREE TIMES DAILY BEFORE MEALS AS DIRECTED   OneTouch Verio test strip Generic drug: glucose blood USE TO TEST BLOOD SUGAR 4 TIMES DAILY AS NEEDED   OneTouch Verio w/Device Kit 1 each by Does not apply route 4 (four) times daily.   Pepto-Bismol 262 MG Tabs Generic drug: Bismuth Subsalicylate Take 196 mg by mouth daily as needed (indigestion).         Discharge Instructions:   [facility] Discharge Instructions Carotid Endarterectomy (CEA)  Please refer to the following instructions for your post-procedure care. Your surgeon or physician assistant will discuss any changes with you.  Activity  You are encouraged to walk as much as you can. You can slowly return to normal activities but must avoid strenuous activity and heavy lifting until your doctor tell you it's OK. Avoid activities such as vacuuming or swinging a golf club. You can drive after one week if you are comfortable and you are no longer taking prescription pain medications. It is normal to feel tired for serval weeks after your surgery. It is also normal to have difficulty with sleep habits, eating, and bowel movements after surgery. These will go away with time.  Bathing/Showering  You may shower after you come home. Do not soak in a bathtub, hot tub, or swim until the incision heals completely.  Incision Care  Shower every day. Clean your incision with mild soap and water. Pat the area dry with a clean towel. You do not need a bandage unless otherwise instructed. Do not apply any ointments or creams to your incision. You may  have skin glue on your incision. Do not peel it off. It will come off on its own in about one week. Your incision may feel thickened and raised for several weeks after your surgery. This is normal and the skin will soften over time. For Men Only: It's OK to shave around the incision but do not shave the incision itself for 2 weeks. It is common to have numbness under your chin that could last for several months.  Diet  Resume your normal diet. There are no special food restrictions following this procedure. A low fat/low cholesterol diet is recommended for all patients with vascular disease. In order to heal from your surgery, it is CRITICAL to get adequate nutrition. Your body requires vitamins, minerals, and protein. Vegetables are the best source of vitamins and minerals. Vegetables also provide the perfect balance of protein. Processed food has little nutritional value, so try to avoid this.  Medications  Resume taking all of your medications unless your  doctor or physician assistant tells you not to.  If your incision is causing pain, you may take over-the- counter pain relievers such as acetaminophen (Tylenol). If you were prescribed a stronger pain medication, please be aware these medications can cause nausea and constipation.  Prevent nausea by taking the medication with a snack or meal. Avoid constipation by drinking plenty of fluids and eating foods with a high amount of fiber, such as fruits, vegetables, and grains. Do not take Tylenol if you are taking prescription pain medications.  Follow Up  Our office will schedule a follow up appointment 2-3 weeks following discharge.  Please call us immediately for any of the following conditions  Increased pain, redness, drainage (pus) from your incision site. Fever of 101 degrees or higher. If you should develop stroke (slurred speech, difficulty swallowing, weakness on one side of your body, loss of vision) you should call 911 and go to the  nearest emergency room.  Reduce your risk of vascular disease:  Stop smoking. If you would like help call [facility] at 1-800-QUIT-NOW 332-753-7505) or [place] at 6236185100. Manage your cholesterol Maintain a desired weight Control your diabetes Keep your blood pressure down  If you have any questions, please call the office at [phone].  Prescriptions given: Percocet #15 No Refill  Disposition: Home  Patient's condition: is Good  Follow up: 1. Dr. Trula Slade in 2 weeks.   Barbie Banner PA-C [facility] [phone]   --- For [facility] Registry use ---   Modified Rankin score at D/C (0-6): 0  IV medication needed for:  1. Hypertension: No 2. Hypotension: Yes  Post-op Complications: No  1. Post-op CVA or TIA: No  If yes: Event classification (right eye, left eye, right cortical, left cortical, verterobasilar, other):   If yes: Timing of event (intra-op, <6 hrs post-op, >=6 hrs post-op, unknown):   2. CN injury: No  If yes: CN n/a injuried   3. Myocardial infarction: No  If yes: Dx by (EKG or clinical, Troponin):   4.  CHF: No  5.  Dysrhythmia (new): No  6. Wound infection: No  7. Reperfusion symptoms: No  8. Return to OR: No  If yes: return to OR for (bleeding, neurologic, other CEA incision, other):   Discharge medications: Statin use:  Yes ASA use:  Yes   Beta blocker use:  Yes ACE-Inhibitor use:  No  ARB use:  No CCB use: Yes P2Y12 Antagonist use: No, [ ]  Plavix, [ ]  Plasugrel, [ ]  Ticlopinine, [ ]  Ticagrelor, [ ]  Other, [ ]  No for medical reason, [ ]  Non-compliant, [ ]  Not-indicated Anti-coagulant use:  No, [ ]  Warfarin, [ ]  Rivaroxaban, [ ]  Dabigatran,

## 2021-01-06 NOTE — Telephone Encounter (Signed)
Pt's wife brought by patient assistance paperwork by the office on 01/06/2021 at 12:50pm. Put paperwork in Dr.Gherghe's tray at front desk.

## 2021-01-12 ENCOUNTER — Other Ambulatory Visit: Payer: Self-pay

## 2021-01-12 ENCOUNTER — Ambulatory Visit (INDEPENDENT_AMBULATORY_CARE_PROVIDER_SITE_OTHER): Payer: HMO | Admitting: Physician Assistant

## 2021-01-12 VITALS — BP 133/69 | HR 81 | Temp 97.8°F | Resp 20 | Ht 66.0 in | Wt 225.7 lb

## 2021-01-12 DIAGNOSIS — I6523 Occlusion and stenosis of bilateral carotid arteries: Secondary | ICD-10-CM

## 2021-01-12 NOTE — Progress Notes (Signed)
    Postoperative Visit   History of Present Illness   Eric Choi is a 80 y.o. male who presents for postoperative follow-up for: right CEA (Date: 12/31/20) by Dr. Trula Slade due to asymptomatic stenosis.  The patient's neck incision is healed.  The patient denies stroke or TIA symptoms.  He is on an aspirin and statin daily.  He is a former smoker.  Contralateral ICA was estimated to have 40-59% stenosis pre-operatively.  Current Outpatient Medications  Medication Sig Dispense Refill   acetaminophen (TYLENOL) 500 MG tablet Take 500 mg by mouth every 6 (six) hours as needed for moderate pain.     amLODipine (NORVASC) 10 MG tablet TAKE ONE TABLET BY MOUTH DAILY 90 tablet 3   aspirin 81 MG tablet Take 81 mg by mouth daily.     atorvastatin (LIPITOR) 40 MG tablet Take 1 tablet (40 mg total) by mouth daily. 90 tablet 4   Bismuth Subsalicylate (PEPTO-BISMOL) 262 MG TABS Take 524 mg by mouth daily as needed (indigestion).     Blood Glucose Monitoring Suppl (ONETOUCH VERIO) w/Device KIT 1 each by Does not apply route 4 (four) times daily. 1 kit 0   ezetimibe (ZETIA) 10 MG tablet TAKE ONE TABLET BY MOUTH DAILY 90 tablet 1   gemfibrozil (LOPID) 600 MG tablet Take 1 tablet (600 mg total) by mouth daily at 2 PM. 90 tablet 0   HYDROcodone-acetaminophen (NORCO/VICODIN) 5-325 MG tablet Take 1 tablet by mouth every 4 (four) hours as needed for moderate pain. 15 tablet 0   insulin NPH Human (NOVOLIN N) 100 UNIT/ML injection INJECT 45 - 55 UNITS UNDER THE SKIN BEFORE BREAKFAST AND DINNER 30 mL 11   insulin regular (NOVOLIN R) 100 units/mL injection INJECT 15-30 UNITS THREE TIMES DAILY BEFORE MEALS AS DIRECTED 30 mL 11   Lancets Misc. (ACCU-CHEK MULTICLIX LANCET DEV) KIT Use to check sugar 4 times daily 400 each 5   lisinopril (ZESTRIL) 30 MG tablet TAKE ONE TABLET BY MOUTH DAILY 90 tablet 3   METAMUCIL FIBER PO Take 1 Dose by mouth daily. 1 dose= 1 tablespoon     metoprolol tartrate (LOPRESSOR) 50 MG tablet  TAKE ONE TABLET BY MOUTH DAILY 90 tablet 3   NEEDLE, DISP, 30 G 30G X 1" MISC by Does not apply route as directed.     ONETOUCH VERIO test strip USE TO TEST BLOOD SUGAR 4 TIMES DAILY AS NEEDED 400 strip 10   No current facility-administered medications for this visit.    For [facility] Use Only   PRE-ADM LIVING: Home  AMB STATUS: Ambulatory   Physical Examination   Vitals:   01/12/21 0957 01/12/21 0958  BP: 138/69 133/69  Pulse: 81   Resp: 20   Temp: 97.8 F (36.6 C)   TempSrc: Temporal   SpO2: 97%   Weight: 225 lb 11.2 oz (102.4 kg)   Height: _0  (1.676 m)     right Neck: Incision is healed  Neuro: CN 2-12 are intact    Medical Decision Making   Eric Choi is a 80 y.o. male who presents s/p right CEA.  Subjectively patient has not had any neurological symptoms post operatively R neck incision is healing well Continue aspirin and statin daily L ICA 40-59% stenosis; recheck carotid duplex in 6 months  Dagoberto Ligas PA-C [facility] [place] Office: [facility] MD: Trula Slade

## 2021-01-14 ENCOUNTER — Other Ambulatory Visit: Payer: Self-pay

## 2021-01-14 DIAGNOSIS — I6523 Occlusion and stenosis of bilateral carotid arteries: Secondary | ICD-10-CM

## 2021-01-16 ENCOUNTER — Encounter: Payer: Self-pay | Admitting: Cardiovascular Disease

## 2021-01-16 ENCOUNTER — Ambulatory Visit: Payer: HMO | Admitting: Cardiovascular Disease

## 2021-01-16 ENCOUNTER — Other Ambulatory Visit: Payer: Self-pay

## 2021-01-16 VITALS — BP 146/60 | HR 80 | Ht 66.0 in | Wt 226.0 lb

## 2021-01-16 DIAGNOSIS — I10 Essential (primary) hypertension: Secondary | ICD-10-CM | POA: Diagnosis not present

## 2021-01-16 DIAGNOSIS — I2510 Atherosclerotic heart disease of native coronary artery without angina pectoris: Secondary | ICD-10-CM | POA: Diagnosis not present

## 2021-01-16 DIAGNOSIS — I739 Peripheral vascular disease, unspecified: Secondary | ICD-10-CM

## 2021-01-16 DIAGNOSIS — E7800 Pure hypercholesterolemia, unspecified: Secondary | ICD-10-CM | POA: Diagnosis not present

## 2021-01-16 NOTE — Progress Notes (Signed)
Chief Complaint  Patient presents with   Follow-up    CAD    History of Present Illness: 80 yo male with history of CAD, DM, CKD, HTN, former tobacco abuse, PAD, carotid artery disease and hyperlipidemia who is here today for cardiac follow up. I saw him as a new consult September 2021 for the evaluation of dyspnea on exertion. He is followed in the [facility] office by Dr. Trula Slade for PAD with no prior lower extremity procedures. 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 He had followed in the past with Dr. Olevia Perches. He described dyspnea on exertion for several years with no associated chest pain, LE edema, dizziness, near syncope or palpitations. Former smoker but stopped in 1993. Echo 02/06/20 with LVEF=50%, mild LVH, grade 1 diastolic dysfunction. No significant valve disease. Nuclear stress test 02/06/20 with no evidence of ischemia. I saw him in May 2022 and he continued to have complaints of dyspnea on exertion with no change over several years. He was seen in Nephrology. He underwent right carotid endarterectomy August 2022.   He is here today for follow up. The patient denies any chest pain, dyspnea, palpitations, lower extremity edema, orthopnea, PND, dizziness, near syncope or syncope.   Primary Care Physician: [name], NP  Past Medical History:  Diagnosis Date   CAD (coronary artery disease)    Colon polyp    DIABETES MELLITUS, TYPE II 11/03/2006   Dyspnea    HYPERLIPIDEMIA 11/03/2006   HYPERTENSION 11/03/2006   Myocardial infarction [facility])    PAD (peripheral artery disease) ([place])     Past Surgical History:  Procedure Laterality Date   ANGIOPLASTY  20 years ago   percutaneous transluminal    CATARACT EXTRACTION  2002, 2005   bilateral   ENDARTERECTOMY Right 12/31/2020   Procedure: RIGHT CAROTID ENDARTERECTOMY;  Surgeon: Serafina Mitchell, MD;  Location: [facility];  Service: Vascular;  Laterality: Right;   PATCH ANGIOPLASTY  Right 12/31/2020   Procedure: PATCH ANGIOPLASTY OF RIGHT CAROTID ARTERY USING [place];  Surgeon: Serafina Mitchell, MD;  Location: [facility] OR;  Service: Vascular;  Laterality: Right;    Current Outpatient Medications  Medication Sig Dispense Refill   acetaminophen (TYLENOL) 500 MG tablet Take 500 mg by mouth every 6 (six) hours as needed for moderate pain.     amLODipine (NORVASC) 10 MG tablet TAKE ONE TABLET BY MOUTH DAILY 90 tablet 3   aspirin 81 MG tablet Take 81 mg by mouth daily.     atorvastatin (LIPITOR) 40 MG tablet Take 1 tablet (40 mg total) by mouth daily. 90 tablet 4   Blood Glucose Monitoring Suppl (ONETOUCH VERIO) w/Device KIT 1 each by Does not apply route 4 (four) times daily. 1 kit 0   ezetimibe (ZETIA) 10 MG tablet TAKE ONE TABLET BY MOUTH DAILY 90 tablet 1   gemfibrozil (LOPID) 600 MG tablet Take 1 tablet (600 mg total) by mouth daily at 2 PM. 90 tablet 0   insulin NPH Human (NOVOLIN N) 100 UNIT/ML injection INJECT 45 - 55 UNITS UNDER THE SKIN BEFORE BREAKFAST AND DINNER 30 mL 11   insulin regular (NOVOLIN R) 100 units/mL injection INJECT 15-30 UNITS THREE TIMES DAILY BEFORE MEALS AS DIRECTED 30 mL 11   Lancets Misc. (ACCU-CHEK MULTICLIX LANCET DEV) KIT Use to check sugar 4 times daily 400 each 5   lisinopril (ZESTRIL) 30 MG tablet TAKE ONE TABLET BY MOUTH DAILY 90 tablet  3   METAMUCIL FIBER PO Take 1 Dose by mouth daily. 1 dose= 1 tablespoon     metoprolol tartrate (LOPRESSOR) 50 MG tablet TAKE ONE TABLET BY MOUTH DAILY 90 tablet 3   NEEDLE, DISP, 30 G 30G X 1" MISC by Does not apply route as directed.     ONETOUCH VERIO test strip USE TO TEST BLOOD SUGAR 4 TIMES DAILY AS NEEDED 400 strip 10   Semaglutide (OZEMPIC, 0.25 OR 0.5 MG/DOSE, [place]) Inject 0.5 mg into the skin once a week.     Bismuth Subsalicylate (PEPTO-BISMOL) 262 MG TABS Take 524 mg by mouth daily as needed (indigestion). (Patient not taking: Reported on 01/16/2021)     HYDROcodone-acetaminophen  (NORCO/VICODIN) 5-325 MG tablet Take 1 tablet by mouth every 4 (four) hours as needed for moderate pain. (Patient not taking: Reported on 01/16/2021) 15 tablet 0   No current facility-administered medications for this visit.    No Known Allergies  Social History   Socioeconomic History   Marital status: Married    Spouse name: Not on file   Number of children: 2   Years of education: Not on file   Highest education level: Not on file  Occupational History   Occupation: Retired-Truck driver  Tobacco Use   Smoking status: Former    Types: Cigarettes    Quit date: 05/18/1991    Years since quitting: 29.6   Smokeless tobacco: Never  Vaping Use   Vaping Use: Never used  Substance and Sexual Activity   Alcohol use: No   Drug use: No   Sexual activity: Not on file  Other Topics Concern   Not on file  Social History Narrative   Works 3rd shift   Regular Exercise-yes   Social Determinants of Radio broadcast assistant Strain: Low Risk    Difficulty of Paying Living Expenses: Not hard at all  Food Insecurity: Not on file  Transportation Needs: No Transportation Needs   Lack of Transportation (Medical): No   Lack of Transportation (Non-Medical): No  Physical Activity: Not on file  Stress: Not on file  Social Connections: Not on file  Intimate Partner Violence: Not on file    Family History  Problem Relation Age of Onset   COPD Father    Alcohol abuse Father    Diabetes Father    Cancer Sister        breast   Stroke Mother    Colon cancer Neg Hx     Review of Systems:  As stated in the HPI and otherwise negative.   BP (!) 146/60   Pulse 80   Ht _0  (1.676 m)   Wt 226 lb (102.5 kg)   SpO2 97%   BMI 36.48 kg/m   Physical Examination: General: Well developed, well nourished, NAD  HEENT: OP clear, mucus membranes moist  SKIN: warm, dry. No rashes. Neuro: No focal deficits  Musculoskeletal: Muscle strength 5/5 all ext  Psychiatric: Mood and affect normal   Neck: No JVD, no carotid bruits, no thyromegaly, no lymphadenopathy.  Lungs:Clear bilaterally, no wheezes, rhonci, crackles Cardiovascular: Regular rate and rhythm. No murmurs, gallops or rubs. Abdomen:Soft. Bowel sounds present. Non-tender.  Extremities: No lower extremity edema. Pulses are 2 + in the bilateral DP/PT.  EKG:  EKG is  not ordered today. The ekg ordered today demonstrates   Echo 02/06/20:  1. Left ventricular ejection fraction, by estimation, is 50%. The left  ventricle has mildly decreased function. The left ventricle demonstrates  regional wall motion abnormalities with mid-apical inferoseptal and  anteroseptal mild hypokinesis. There is  mild left ventricular hypertrophy. Left ventricular diastolic parameters  are consistent with Grade I diastolic dysfunction (impaired relaxation).   2. Right ventricular systolic function is normal. The right ventricular  size is normal. Tricuspid regurgitation signal is inadequate for assessing  PA pressure.   3. The mitral valve is normal in structure. No evidence of mitral valve  regurgitation. No evidence of mitral stenosis.   4. The aortic valve is tricuspid. Aortic valve regurgitation is not  visualized. Mild aortic valve sclerosis is present, with no evidence of  aortic valve stenosis.   5. IVC not visualized.   Recent Labs: 08/26/2020: TSH 3.67 12/26/2020: ALT 25 01/01/2021: BUN 33; Creatinine, Ser 2.58; Hemoglobin 11.0; Platelets 278; Potassium 4.8; Sodium 132   Lipid Panel    Component Value Date/Time   CHOL 99 01/01/2021 0540   TRIG 203 (H) 01/01/2021 0540   HDL 28 (L) 01/01/2021 0540   CHOLHDL 3.5 01/01/2021 0540   VLDL 41 (H) 01/01/2021 0540   LDLCALC 30 01/01/2021 0540   LDLDIRECT 69.0 09/05/2020 0800     Wt Readings from Last 3 Encounters:  01/16/21 226 lb (102.5 kg)  01/12/21 225 lb 11.2 oz (102.4 kg)  01/05/21 231 lb (104.8 kg)     Other studies Reviewed: Additional studies/ records that were reviewed  today include: office notes Review of the above records demonstrates:   Assessment and Plan:   1. CAD without angina: He is known to have CAD by cath in 1994. No cath since then. Nuclear stress test in September 2021 with no ischemia. Echo September 2021 with LVEF=50%. He has no chest pain. He has baseline dyspnea on exertion with no recent changes. Will continue ASA, statin and Imdur. We discussed a cardiac cath if his dyspnea worsens but given his CKD, this would be high risk for worsening his renal function. He has been seen by Nephrology.   2. PAD: Followed in [facility]. 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 Right CEA August 2022.   3. HTN: BP is controlled. No changes  4. Hyperlipidemia: LDL at goal April 2022. Continue statin.  Current medicines are reviewed at length with the patient today.  The patient does not have concerns regarding medicines.  The following changes have been made:  no change  Labs/ tests ordered today include:   No orders of the defined types were placed in this encounter.    Disposition:   F/U with me in 12 months   Signed, Lauree Chandler, MD 01/16/2021 3:08 PM    [facility] [address], [place], [place]  [address] Phone: [phone]; Fax: [phone]

## 2021-01-16 NOTE — Patient Instructions (Signed)
Medication Instructions:  No changes *If you need a refill on your cardiac medications before your next appointment, please call your pharmacy*   Lab Work: No changes If you have labs (blood work) drawn today and your tests are completely normal, you will receive your results only by: [place] (if you have MyChart) OR A paper copy in the mail If you have any lab test that is abnormal or we need to change your treatment, we will call you to review the results.   Testing/Procedures: None   Follow-Up: At [facility], you and your health needs are our priority.  As part of our continuing mission to provide you with exceptional heart care, we have created designated Provider Care Teams.  These Care Teams include your primary Cardiologist (physician) and Advanced Practice Providers (APPs -  Physician Assistants and Nurse Practitioners) who all work together to provide you with the care you need, when you need it.  We recommend signing up for the patient portal called "MyChart".  Sign up information is provided on this After Visit Summary.  MyChart is used to connect with patients for Virtual Visits (Telemedicine).  Patients are able to view lab/test results, encounter notes, upcoming appointments, etc.  Non-urgent messages can be sent to your provider as well.   To learn more about what you can do with MyChart, go to [email or web address].    Your next appointment:   12 month(s)  The format for your next appointment:   In Person  Provider:   You may see Lauree Chandler, MD or one of the following Advanced Practice Providers on your designated Care Team:   Melina Copa, PA-C Ermalinda Barrios, PA-C   Other Instructions

## 2021-01-22 ENCOUNTER — Encounter (INDEPENDENT_AMBULATORY_CARE_PROVIDER_SITE_OTHER): Payer: Self-pay | Admitting: Ophthalmology

## 2021-01-22 ENCOUNTER — Other Ambulatory Visit: Payer: Self-pay

## 2021-01-22 ENCOUNTER — Ambulatory Visit (INDEPENDENT_AMBULATORY_CARE_PROVIDER_SITE_OTHER): Payer: HMO | Admitting: Ophthalmology

## 2021-01-22 DIAGNOSIS — H43822 Vitreomacular adhesion, left eye: Secondary | ICD-10-CM

## 2021-01-22 DIAGNOSIS — E113393 Type 2 diabetes mellitus with moderate nonproliferative diabetic retinopathy without macular edema, bilateral: Secondary | ICD-10-CM | POA: Diagnosis not present

## 2021-01-22 NOTE — Progress Notes (Signed)
01/22/2021     CHIEF COMPLAINT Patient presents for  Chief Complaint  Patient presents with   Retina Follow Up      HISTORY OF PRESENT ILLNESS: Eric Choi is a 80 y.o. male who presents to the clinic today for:   HPI     Retina Follow Up   Patient presents with  Diabetic Retinopathy.  In both eyes.  This started 1 year ago.  Severity is mild.  Duration of 1 year.        Comments   1 year f/u OU with OCT  Pt states his glasses do not seem to help much all of the time, however he also states that blinking helps improve and clear the vision. Pt denies any new floaters or flashes. Pt denies any eye pain.  Type 2 Diabetic. Diagnosed since 1994 A1c: 8.1 (2-3 wks ago)  Eye Meds: None      Last edited by Reather Littler, COA on 01/22/2021  9:38 AM.      Referring physician: [name], NP [place],  [place] [address]  HISTORICAL INFORMATION:   Selected notes from the MEDICAL RECORD NUMBER    Lab Results  Component Value Date   HGBA1C 8.8 (H) 12/26/2020     CURRENT MEDICATIONS: No current outpatient medications on file. (Ophthalmic Drugs)   No current facility-administered medications for this visit. (Ophthalmic Drugs)   Current Outpatient Medications (Other)  Medication Sig   acetaminophen (TYLENOL) 500 MG tablet Take 500 mg by mouth every 6 (six) hours as needed for moderate pain.   amLODipine (NORVASC) 10 MG tablet TAKE ONE TABLET BY MOUTH DAILY   aspirin 81 MG tablet Take 81 mg by mouth daily.   atorvastatin (LIPITOR) 40 MG tablet Take 1 tablet (40 mg total) by mouth daily.   Bismuth Subsalicylate (PEPTO-BISMOL) 262 MG TABS Take 524 mg by mouth daily as needed (indigestion). (Patient not taking: Reported on 01/16/2021)   Blood Glucose Monitoring Suppl (ONETOUCH VERIO) w/Device KIT 1 each by Does not apply route 4 (four) times daily.   ezetimibe (ZETIA) 10 MG tablet TAKE ONE TABLET BY MOUTH DAILY   gemfibrozil (LOPID) 600 MG tablet  Take 1 tablet (600 mg total) by mouth daily at 2 PM.   HYDROcodone-acetaminophen (NORCO/VICODIN) 5-325 MG tablet Take 1 tablet by mouth every 4 (four) hours as needed for moderate pain. (Patient not taking: Reported on 01/16/2021)   insulin NPH Human (NOVOLIN N) 100 UNIT/ML injection INJECT 45 - 55 UNITS UNDER THE SKIN BEFORE BREAKFAST AND DINNER   insulin regular (NOVOLIN R) 100 units/mL injection INJECT 15-30 UNITS THREE TIMES DAILY BEFORE MEALS AS DIRECTED   Lancets Misc. (ACCU-CHEK MULTICLIX LANCET DEV) KIT Use to check sugar 4 times daily   lisinopril (ZESTRIL) 30 MG tablet TAKE ONE TABLET BY MOUTH DAILY   METAMUCIL FIBER PO Take 1 Dose by mouth daily. 1 dose= 1 tablespoon   metoprolol tartrate (LOPRESSOR) 50 MG tablet TAKE ONE TABLET BY MOUTH DAILY   NEEDLE, DISP, 30 G 30G X 1" MISC by Does not apply route as directed.   ONETOUCH VERIO test strip USE TO TEST BLOOD SUGAR 4 TIMES DAILY AS NEEDED   Semaglutide (OZEMPIC, 0.25 OR 0.5 MG/DOSE, [place]) Inject 0.5 mg into the skin once a week.   No current facility-administered medications for this visit. (Other)      REVIEW OF SYSTEMS:    ALLERGIES No Known Allergies  PAST MEDICAL HISTORY Past Medical History:  Diagnosis Date   CAD (coronary artery disease)    Colon polyp    DIABETES MELLITUS, TYPE II 11/03/2006   Dyspnea    HYPERLIPIDEMIA 11/03/2006   HYPERTENSION 11/03/2006   Myocardial infarction [facility])    PAD (peripheral artery disease) (HCC)    Past Surgical History:  Procedure Laterality Date   ANGIOPLASTY  20 years ago   percutaneous transluminal    CATARACT EXTRACTION  2002, 2005   bilateral   ENDARTERECTOMY Right 12/31/2020   Procedure: RIGHT CAROTID ENDARTERECTOMY;  Surgeon: Serafina Mitchell, MD;  Location: [facility] OR;  Service: Vascular;  Laterality: Right;   PATCH ANGIOPLASTY Right 12/31/2020   Procedure: PATCH ANGIOPLASTY OF RIGHT CAROTID ARTERY USING XENOSURE BOVINE PERICARDIUM PATCH;  Surgeon: Serafina Mitchell, MD;   Location: [facility] OR;  Service: Vascular;  Laterality: Right;    FAMILY HISTORY Family History  Problem Relation Age of Onset   COPD Father    Alcohol abuse Father    Diabetes Father    Cancer Sister        breast   Stroke Mother    Colon cancer Neg Hx     SOCIAL HISTORY Social History   Tobacco Use   Smoking status: Former    Types: Cigarettes    Quit date: 05/18/1991    Years since quitting: 29.7   Smokeless tobacco: Never  Vaping Use   Vaping Use: Never used  Substance Use Topics   Alcohol use: No   Drug use: No         OPHTHALMIC EXAM:  Base Eye Exam     Visual Acuity (ETDRS)       Right Left   Dist cc 20/40 +2 20/70 -2   Dist ph cc 20/25 -2 20/40 -1    Correction: Glasses         Tonometry (Tonopen, 9:48 AM)       Right Left   Pressure 6 11         Pupils       Pupils Dark Light Shape React APD   Right PERRL 2.5 2 Round Brisk None   Left PERRL 2.5 2 Round Brisk None         Visual Fields (Counting fingers)       Left Right    Full Full         Extraocular Movement       Right Left    Full, Ortho Full, Ortho         Neuro/Psych     Oriented x3: Yes   Mood/Affect: Normal         Dilation     Both eyes: 1.0% Mydriacyl, 2.5% Phenylephrine @ 9:48 AM           Slit Lamp and Fundus Exam     External Exam       Right Left   External Normal Normal         Slit Lamp Exam       Right Left   Lids/Lashes Normal Normal   Conjunctiva/Sclera White and quiet White and quiet   Cornea Clear Clear   Anterior Chamber Deep and quiet Deep and quiet   Iris Round and reactive Round and reactive   Lens Centered posterior chamber intraocular lens Centered posterior chamber intraocular lens   Anterior Vitreous Normal Normal         Fundus Exam       Right Left   Posterior Vitreous Posterior vitreous detachment Posterior  vitreous detachment   Disc Normal Normal   C/D Ratio 0.55 0.55   Macula Microaneurysms  Microaneurysms   Vessels NPDR- Mild NPDR- Mild   Periphery no neovascularization, normal no neovascularization, normal            IMAGING AND PROCEDURES  Imaging and Procedures for 01/22/21  OCT, Retina - OU - Both Eyes       Right Eye Quality was good. Scan locations included subfoveal. Central Foveal Thickness: 225. Findings include normal foveal contour.   Left Eye Quality was good. Scan locations included subfoveal. Central Foveal Thickness: 248. Findings include normal foveal contour, vitreomacular adhesion .   Notes Nondistorting vitreomacular adhesion left eye.             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 Good blood glucose, blood pressure, and serum lipid control was recommended as well as avoidance of smoking and maintenance of normal weight.  Close follow up with PCP encouraged.  Vitreomacular adhesion of left eye Minor none distorting     ICD-10-CM   1. 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 Vitreomacular adhesion of left eye  H43.822       1.  Moderate nonproliferative diabetic retinopathy of each eye stable OU.  We will continue to observe  2.  3.  Ophthalmic Meds Ordered this visit:  No orders of the defined types were placed in this encounter.      Return in about 1 year (around 01/22/2022) for DILATE OU, COLOR FP, OCT.  There are no Patient Instructions on file for this visit.   Explained the diagnoses, plan, and follow up with the patient and they expressed understanding.  Patient expressed understanding of the  importance of proper follow up care.   Clent Demark Leiloni Smithers M.D. Diseases & Surgery of the Retina and Vitreous [facility] [place] 01/22/21     Abbreviations: M myopia (nearsighted); A astigmatism; H hyperopia (farsighted); P presbyopia; Mrx spectacle prescription;  CTL contact lenses; OD right eye; OS left eye; OU both eyes  XT exotropia; ET esotropia; PEK punctate epithelial keratitis; PEE punctate epithelial erosions; DES dry eye syndrome; MGD meibomian gland dysfunction; ATs artificial tears; PFAT's preservative free artificial tears; [place] nuclear sclerotic cataract; PSC posterior subcapsular cataract; ERM epi-retinal membrane; PVD posterior vitreous detachment; RD retinal detachment; DM diabetes mellitus; DR diabetic retinopathy; NPDR non-proliferative diabetic retinopathy; PDR proliferative diabetic retinopathy; CSME clinically significant macular edema; DME diabetic macular edema; dbh dot blot hemorrhages; CWS cotton wool spot; POAG primary open angle glaucoma; C/D cup-to-disc ratio; HVF humphrey visual field; GVF goldmann visual field; OCT optical coherence tomography; IOP intraocular pressure; BRVO Branch retinal vein occlusion; CRVO central retinal vein occlusion; CRAO central retinal artery occlusion; BRAO branch retinal artery occlusion; RT retinal tear; SB scleral buckle; PPV pars plana vitrectomy; VH Vitreous hemorrhage; PRP panretinal laser photocoagulation; IVK intravitreal kenalog; VMT vitreomacular traction; MH Macular hole;  NVD neovascularization of the disc; NVE neovascularization elsewhere; AREDS age related eye disease study; ARMD age related macular degeneration; POAG primary open angle glaucoma; EBMD epithelial/anterior basement membrane dystrophy; ACIOL anterior chamber intraocular lens; IOL intraocular  lens; PCIOL posterior chamber intraocular lens; Phaco/IOL phacoemulsification with intraocular lens placement; [place] photorefractive keratectomy; LASIK laser assisted in situ  keratomileusis; HTN hypertension; DM diabetes mellitus; COPD chronic obstructive pulmonary disease

## 2021-01-22 NOTE — Assessment & Plan Note (Signed)
The nature of moderate nonproliferative diabetic retinopathy was discussed with the patient as well as the need for more frequent follow up to judge for progression. Good blood glucose, blood pressure, and serum lipid control was recommended as well as avoidance of smoking and maintenance of normal weight.  Close follow up with PCP encouraged. °

## 2021-01-22 NOTE — Assessment & Plan Note (Signed)
Minor none distorting

## 2021-01-26 ENCOUNTER — Other Ambulatory Visit: Payer: Self-pay | Admitting: Internal Medicine

## 2021-01-28 ENCOUNTER — Other Ambulatory Visit: Payer: Self-pay | Admitting: Adult Health

## 2021-01-29 ENCOUNTER — Other Ambulatory Visit (HOSPITAL_COMMUNITY): Payer: Self-pay

## 2021-01-30 ENCOUNTER — Telehealth: Payer: Self-pay | Admitting: Internal Medicine

## 2021-01-30 DIAGNOSIS — E1165 Type 2 diabetes mellitus with hyperglycemia: Secondary | ICD-10-CM

## 2021-01-30 DIAGNOSIS — IMO0002 Reserved for concepts with insufficient information to code with codable children: Secondary | ICD-10-CM

## 2021-01-30 NOTE — Telephone Encounter (Signed)
Pt's wife at front desk requesting a free style libre sent in to the pharmacy. Pt has already talked to insurance and insurance is going to cover it.    [facility] [phone] - [place], [place]

## 2021-01-30 NOTE — Telephone Encounter (Signed)
Paperwork completed and faxed to [employer] at [phone] 01/16/21 confirmation received at 16:58.

## 2021-01-31 MED ORDER — FREESTYLE LIBRE 2 READER DEVI: 0 refills | Status: AC

## 2021-01-31 MED ORDER — FREESTYLE LIBRE 2 SENSOR MISC: 3 refills | Status: DC

## 2021-01-31 NOTE — Telephone Encounter (Signed)
Rx sent to preferred pharmacy.

## 2021-02-11 ENCOUNTER — Other Ambulatory Visit (HOSPITAL_COMMUNITY): Payer: Self-pay

## 2021-02-24 ENCOUNTER — Telehealth: Payer: Self-pay

## 2021-02-24 NOTE — Telephone Encounter (Signed)
Called to inform patient that 4 boxes of ozempic ready for pickup.

## 2021-03-05 DIAGNOSIS — N189 Chronic kidney disease, unspecified: Secondary | ICD-10-CM | POA: Diagnosis not present

## 2021-03-05 DIAGNOSIS — I129 Hypertensive chronic kidney disease with stage 1 through stage 4 chronic kidney disease, or unspecified chronic kidney disease: Secondary | ICD-10-CM | POA: Diagnosis not present

## 2021-03-05 DIAGNOSIS — I739 Peripheral vascular disease, unspecified: Secondary | ICD-10-CM | POA: Diagnosis not present

## 2021-03-05 DIAGNOSIS — N2581 Secondary hyperparathyroidism of renal origin: Secondary | ICD-10-CM | POA: Diagnosis not present

## 2021-03-05 DIAGNOSIS — N184 Chronic kidney disease, stage 4 (severe): Secondary | ICD-10-CM | POA: Diagnosis not present

## 2021-03-05 DIAGNOSIS — E785 Hyperlipidemia, unspecified: Secondary | ICD-10-CM | POA: Diagnosis not present

## 2021-03-05 DIAGNOSIS — D631 Anemia in chronic kidney disease: Secondary | ICD-10-CM | POA: Diagnosis not present

## 2021-03-12 DIAGNOSIS — N184 Chronic kidney disease, stage 4 (severe): Secondary | ICD-10-CM | POA: Diagnosis not present

## 2021-03-13 ENCOUNTER — Other Ambulatory Visit: Payer: Self-pay | Admitting: Adult Health

## 2021-03-16 ENCOUNTER — Other Ambulatory Visit (HOSPITAL_COMMUNITY): Payer: Self-pay

## 2021-03-20 ENCOUNTER — Other Ambulatory Visit: Payer: Self-pay | Admitting: Adult Health

## 2021-04-08 DIAGNOSIS — E119 Type 2 diabetes mellitus without complications: Secondary | ICD-10-CM | POA: Diagnosis not present

## 2021-04-08 DIAGNOSIS — H26491 Other secondary cataract, right eye: Secondary | ICD-10-CM | POA: Diagnosis not present

## 2021-04-08 DIAGNOSIS — Z961 Presence of intraocular lens: Secondary | ICD-10-CM | POA: Diagnosis not present

## 2021-04-08 DIAGNOSIS — H40013 Open angle with borderline findings, low risk, bilateral: Secondary | ICD-10-CM | POA: Diagnosis not present

## 2021-04-08 LAB — HM DIABETES EYE EXAM

## 2021-04-21 ENCOUNTER — Other Ambulatory Visit (HOSPITAL_COMMUNITY): Payer: Self-pay

## 2021-04-21 ENCOUNTER — Encounter: Payer: Self-pay | Admitting: Adult Health

## 2021-04-22 ENCOUNTER — Other Ambulatory Visit (HOSPITAL_COMMUNITY): Payer: Self-pay

## 2021-04-22 DIAGNOSIS — H26492 Other secondary cataract, left eye: Secondary | ICD-10-CM | POA: Diagnosis not present

## 2021-04-22 DIAGNOSIS — H524 Presbyopia: Secondary | ICD-10-CM | POA: Diagnosis not present

## 2021-04-28 ENCOUNTER — Encounter: Payer: Self-pay | Admitting: Internal Medicine

## 2021-04-28 ENCOUNTER — Ambulatory Visit: Payer: HMO | Admitting: Internal Medicine

## 2021-04-28 ENCOUNTER — Other Ambulatory Visit: Payer: Self-pay

## 2021-04-28 VITALS — BP 120/64 | HR 75 | Ht 66.0 in | Wt 222.8 lb

## 2021-04-28 DIAGNOSIS — R7989 Other specified abnormal findings of blood chemistry: Secondary | ICD-10-CM | POA: Diagnosis not present

## 2021-04-28 DIAGNOSIS — Z6834 Body mass index (BMI) 34.0-34.9, adult: Secondary | ICD-10-CM | POA: Diagnosis not present

## 2021-04-28 DIAGNOSIS — E669 Obesity, unspecified: Secondary | ICD-10-CM | POA: Diagnosis not present

## 2021-04-28 DIAGNOSIS — E785 Hyperlipidemia, unspecified: Secondary | ICD-10-CM | POA: Diagnosis not present

## 2021-04-28 DIAGNOSIS — E1159 Type 2 diabetes mellitus with other circulatory complications: Secondary | ICD-10-CM

## 2021-04-28 DIAGNOSIS — E1165 Type 2 diabetes mellitus with hyperglycemia: Secondary | ICD-10-CM | POA: Diagnosis not present

## 2021-04-28 LAB — POCT GLYCOSYLATED HEMOGLOBIN (HGB A1C): Hemoglobin A1C: 8.3 % — AB (ref 4.0–5.6)

## 2021-04-28 NOTE — Patient Instructions (Addendum)
Please continue: - Ozempic 1 mg weekly  Also: Insulin Before breakfast Before lunch Before dinner Bedtime  Regular 30-35 units 15-20 units 25-30 units -  NPH 45 units  -  - 45 units   If you do have a snack at night, may need to cover this with 5-10 units of regular insulin (R).  Please come back for a follow-up appointment in 3-4 months.

## 2021-04-28 NOTE — Progress Notes (Signed)
Patient ID: Eric Choi, male   DOB: 1941-04-11, 80 y.o.   MRN: [id number]  This visit occurred during the SARS-CoV-2 public health emergency.  Safety protocols were in place, including screening questions prior to the visit, additional usage of staff PPE, and extensive cleaning of exam room while observing appropriate contact time as indicated for disinfecting solutions.  HPI: Eric Choi is a 80 y.o.-year-old male, returning for f/u for DM2 dx 1990, insulin-dependent since 2005, uncontrolled, with complications (CAD, CKD, PN, DR, PVD). Last visit was 4 months ago.  Interim history: His claudication improved recently. No increased urination, blurry vision, nausea, chest pain.  Reviewed HbA1c levels: Lab Results  Component Value Date   HGBA1C 8.8 (H) 12/26/2020   HGBA1C 8.3 (A) 08/26/2020   HGBA1C 8.4 (A) 04/17/2020   He is on: Insulin Before breakfast Before lunch Before dinner Bedtime  Regular 30-35 units (when you are active, use 20-25 units) 15-20 units 30-35 >> 17 units 17 units  NPH 40 units    27 units 27 units    - Ozempic 1 mg weekly - Pt assistance pgm   We tried U500 insulin but this was too expensive. He did not start Trulicity due to price - 145$.  Pt checks his sugars  more than 4 times a day with his freestyle libre 2 CGM:   Prev.: - am: 77-170, 223 >> 52 x1, 96-246 >> 42, 73-164, 192, 200 - 1-2h after b'fast: 182, 251 >> n/c >> 264, 444 >> 90-200 >> n/c - before lunch: 58, 60-122, 224 >> 56, 65, 93-105 >> 101-244 - 2h after lunch: 136-188 >> n/c >> n/c >> 266 >> 203 >> 221 - before dinner: 51, 160-195, 313 >> 110-172 >> 78-221, 332 - 1-2h after dinner:  150-211 >> 139, 194 >> n/c >> 169-273 - bedtime: 131 >> 72-140, 190 >> n/c >> 60-204 >> 126-242 - nighttime:> n/c >> 68, 77-232 >> 122, 144, 155 >> 185-248 Lowest CBG: 55 >> 52 >> 42 x1; he has hypoglycemia awareness in the 90s. Highest CBG: 444 >> 300 x1 >> 332.  Pt's meals are: - Breakfast: cereal +  milk + eggs + bacon/sausage - Lunch: egg sandwich - Dinner: meat + veggies + starch - Snacks: 1-2: including after dinner; pears + mayonnaise, milk + crackers; cake  -+ CKD, last BUN/creatinine:  Lab Results  Component Value Date   BUN 33 (H) 01/01/2021   CREATININE 2.58 (H) 01/01/2021  On lisinopril.  -+ HL; last set of lipids: Lab Results  Component Value Date   CHOL 99 01/01/2021   HDL 28 (L) 01/01/2021   LDLCALC 30 01/01/2021   LDLDIRECT 69.0 09/05/2020   TRIG 203 (H) 01/01/2021   CHOLHDL 3.5 01/01/2021  On Zocor 40, fenofibrate 145.    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 Dr. Radene Ou.    - + numbness and tingling in his feet.  He also has HTN. He had right carotid endarterectomy 12/31/2020.  TSH is slightly elevated in the past but it normalized: Lab Results  Component Value Date   TSH 3.67 08/26/2020   TSH 4.52 (H) 06/11/2020   TSH 4.18 06/05/2019   TSH 3.61 09/08/2018   TSH 4.64 (H) 03/09/2018   TSH 2.90 03/11/2017   TSH 2.69 03/05/2016   TSH 1.62 03/12/2015   TSH 4.04 02/01/2014   TSH 2.38 10/06/2010   TSH 2.80 12/27/2008   He lost his twin brother to cancer 05/2018.  ROS: see HPI  Neurological: no tremors/+ numbness/+ tingling/no dizziness  I reviewed pt's medications, allergies, PMH, social hx, family hx, and changes were documented in the history of present illness. Otherwise, unchanged from my initial visit note.  Past Medical History:  Diagnosis Date   CAD (coronary artery disease)    Colon polyp    DIABETES MELLITUS, TYPE II 11/03/2006   Dyspnea    HYPERLIPIDEMIA 11/03/2006   HYPERTENSION 11/03/2006   Myocardial infarction [facility])    PAD (peripheral artery disease) (HCC)    Past Surgical History:  Procedure Laterality Date   ANGIOPLASTY  20 years ago   percutaneous transluminal    CATARACT EXTRACTION  2002, 2005   bilateral   ENDARTERECTOMY Right 12/31/2020   Procedure: RIGHT CAROTID  ENDARTERECTOMY;  Surgeon: Serafina Mitchell, MD;  Location: [facility] OR;  Service: Vascular;  Laterality: Right;   PATCH ANGIOPLASTY Right 12/31/2020   Procedure: PATCH ANGIOPLASTY OF RIGHT CAROTID ARTERY USING XENOSURE BOVINE PERICARDIUM PATCH;  Surgeon: Serafina Mitchell, MD;  Location: [facility] OR;  Service: Vascular;  Laterality: Right;   Social History   Socioeconomic History   Marital status: Married    Spouse name: Not on file   Number of children: 2   Years of education: Not on file   Highest education level: Not on file  Occupational History   Occupation: Retired-Truck driver  Tobacco Use   Smoking status: Former    Types: Cigarettes    Quit date: 05/18/1991    Years since quitting: 29.9   Smokeless tobacco: Never  Vaping Use   Vaping Use: Never used  Substance and Sexual Activity   Alcohol use: No   Drug use: No   Sexual activity: Not on file  Other Topics Concern   Not on file  Social History Narrative   Works 3rd shift   Regular Exercise-yes   Social Determinants of Radio broadcast assistant Strain: Not on file  Food Insecurity: Not on file  Transportation Needs: Not on file  Physical Activity: Not on file  Stress: Not on file  Social Connections: Not on file  Intimate Partner Violence: Not on file   Current Outpatient Medications on File Prior to Visit  Medication Sig Dispense Refill   acetaminophen (TYLENOL) 500 MG tablet Take 500 mg by mouth every 6 (six) hours as needed for moderate pain.     amLODipine (NORVASC) 10 MG tablet TAKE ONE TABLET BY MOUTH DAILY 90 tablet 3   aspirin 81 MG tablet Take 81 mg by mouth daily.     atorvastatin (LIPITOR) 40 MG tablet Take 1 tablet (40 mg total) by mouth daily. 90 tablet 4   Bismuth Subsalicylate (PEPTO-BISMOL) 262 MG TABS Take 524 mg by mouth daily as needed (indigestion). (Patient not taking: Reported on 01/16/2021)     Blood Glucose Monitoring Suppl (ONETOUCH VERIO) w/Device KIT 1 each by Does not apply route 4 (four) times  daily. 1 kit 0   Continuous Blood Gluc Receiver (FREESTYLE LIBRE 2 READER) DEVI Use as instructed 1 each 0   Continuous Blood Gluc Sensor (FREESTYLE LIBRE 2 SENSOR) MISC Use as instructed change every 14 days 6 each 3   ezetimibe (ZETIA) 10 MG tablet TAKE ONE TABLET BY MOUTH DAILY 90 tablet 1   gemfibrozil (LOPID) 600 MG tablet TAKE ONE TABLET BY MOUTH DAILY AT 2 O'CLOCK IN THE AFTERNOON 90 tablet 0   HYDROcodone-acetaminophen (NORCO/VICODIN) 5-325 MG tablet Take 1 tablet by mouth every 4 (four) hours as  needed for moderate pain. (Patient not taking: Reported on 01/16/2021) 15 tablet 0   insulin NPH Human (NOVOLIN N) 100 UNIT/ML injection INJECT 45 - 55 UNITS UNDER THE SKIN BEFORE BREAKFAST AND DINNER 30 mL 11   insulin regular (NOVOLIN R) 100 units/mL injection INJECT 15-30 UNITS THREE TIMES DAILY BEFORE MEALS AS DIRECTED 30 mL 11   Lancets Misc. (ACCU-CHEK MULTICLIX LANCET DEV) KIT Use to check sugar 4 times daily 400 each 5   lisinopril (ZESTRIL) 30 MG tablet TAKE ONE TABLET BY MOUTH DAILY 90 tablet 3   METAMUCIL FIBER PO Take 1 Dose by mouth daily. 1 dose= 1 tablespoon     metoprolol tartrate (LOPRESSOR) 50 MG tablet TAKE ONE TABLET BY MOUTH DAILY 90 tablet 3   NEEDLE, DISP, 30 G 30G X 1" MISC by Does not apply route as directed.     ONETOUCH VERIO test strip USE TO TEST BLOOD SUGAR 4 TIMES DAILY AS NEEDED 400 strip 10   Semaglutide (OZEMPIC, 0.25 OR 0.5 MG/DOSE, [place]) Inject 0.5 mg into the skin once a week.     simvastatin (ZOCOR) 40 MG tablet TAKE ONE TABLET BY MOUTH AT BEDTIME 90 tablet 0   No current facility-administered medications on file prior to visit.   No Known Allergies Family History  Problem Relation Age of Onset   COPD Father    Alcohol abuse Father    Diabetes Father    Cancer Sister        breast   Stroke Mother    Colon cancer Neg Hx     PE: BP 120/64 (BP Location: Right Arm, Patient Position: Sitting, Cuff Size: Normal)   Pulse 75   Ht _0  (1.676 m)   Wt 222  lb 12.8 oz (101.1 kg)   SpO2 99%   BMI 35.96 kg/m    Wt Readings from Last 3 Encounters:  04/28/21 222 lb 12.8 oz (101.1 kg)  01/16/21 226 lb (102.5 kg)  01/12/21 225 lb 11.2 oz (102.4 kg)   Constitutional: overweight, in NAD Eyes: PERRLA, EOMI, no exophthalmos ENT: moist mucous membranes, no thyromegaly, no cervical lymphadenopathy Cardiovascular: RRR, No MRG Respiratory: CTA B Musculoskeletal: no deformities, strength intact in all 4 Skin: moist, warm, no rashes Neurological: no tremor with outstretched hands, DTR normal in all 4  ASSESSMENT: 1. DM2, uncontrolled, insulin-dep, with complications - CAD - CKD - PN - DR  - Tried to obtain U500 insulin but this was too expensive >> we had to go back to N and R insulin  2. HL  3.  Obesity class 1  4.  Elevated TSH  PLAN:  1. Patient with longstanding, uncontrolled, type 2 diabetes, basal-bolus insulin regimen with NPH and regular insulin due to cost.  In the past he was getting Ozempic through the patient assistance program but he was stopped at last visit as he did not receive the shipment.  At last visit we retried to restart.  I advised him to start at 0.5 mg weekly and then increase to 1 mg weekly.  At last visit, I advised him to decrease his regular insulin in the morning, if he plans to be active during the day to avoid low blood sugars since he previously had 50s and 60s midday after activity.  I also strongly advised him to try to obtain a CGM and gave him contact info for suppliers.  Sugars were fluctuating at that time, with many values at goal and occasional hyperglycemic spikes.  He did  miss some injections at that time.  We did not change his insulin regimen then.  HbA1c was 8.8%, higher. CGM interpretation: -At today's visit, we reviewed his CGM downloads: It appears that 65% of values are in target range (goal >70%), while 35% are higher than 180 (goal <25%), and 0% are lower than 70 (goal <4%).  The calculated  average blood sugar is 170.  The projected HbA1c for the next 3 months (GMI) is 7.4%. -Reviewing the CGM trends, it appears that his sugars are better controlled overnight but they may increase significantly after breakfast.  However, patient describes that he has not been feeling well in the last week and sugars are higher.  Indeed, some of his blood sugars at night after breakfast are better, especially when he is active after the meal.  He tells me that he is not decreasing the dose of regular insulin when plans to be active, as he did not see any more lows when taking the full dose.  Later in the day, sugars are at or above target, without a persistent pattern.  I advised him to increase his NPH in the morning by 5 units.  However, upon questioning, he split his regular insulin to be taken before dinner and at bedtime and did the same with his NPH in the evening.  We discussed that taking regular insulin at bedtime or NPH before dinner can predispose him for lows in the middle of the night.  Therefore, I advised him to take the entire dose of regular insulin before dinner and the entire dose of NPH insulin at bedtime.  To avoid lows overnight, I did advise him to decrease these doses. -I advised him to: Patient Instructions  Please continue: - Ozempic 1 mg weekly  Also: Insulin Before breakfast Before lunch Before dinner Bedtime  Regular 30-35 units 15-20 units 25-30 units -  NPH 45 units  -  - 45 units   If you do have a snack at night, may need to cover this with 5-10 units of regular insulin (R).  Please come back for a follow-up appointment in 3-4 months.   - we checked his HbA1c: 8.3% (lower, but higher than expected from his CGM for the last 2 weeks) - advised to check sugars at different times of the day - 4x a day, rotating check times - advised for yearly eye exams >> he is UTD - return to clinic in 3-4 months   2. HL -Reviewed latest lipid panel from 12/2020: LDL excellent, low  HDL, and improved triglycerides (previously in the 500s): Lab Results  Component Value Date   CHOL 99 01/01/2021   HDL 28 (L) 01/01/2021   LDLCALC 30 01/01/2021   LDLDIRECT 69.0 09/05/2020   TRIG 203 (H) 01/01/2021   CHOLHDL 3.5 01/01/2021  -He continues on Zocor 40 mg daily, fenofibrate 145 mg daily.  Seen in the lipid clinic.  3.  Obesity class 1 -Before the coronavirus pandemic, he was exercising at [employer], but he stopped exercising afterwards due to back pain and sciatica and also leg weakness -Restarted Ozempic which should also help with weight loss -He gained 5 pounds before last visit and 4 pounds since then  4.  Elevated TSH -She had a slightly elevated TSH in 05/2020, however, we repeated his TFTs in 08/2020 when they were normal: Lab Results  Component Value Date   TSH 3.67 08/26/2020  -No signs or symptoms of hypothyroidism -We will continue to keep an eye on  this  Philemon Kingdom, MD PhD [facility] Endocrinology

## 2021-05-06 ENCOUNTER — Other Ambulatory Visit: Payer: Self-pay | Admitting: Adult Health

## 2021-05-20 ENCOUNTER — Other Ambulatory Visit (HOSPITAL_COMMUNITY): Payer: Self-pay

## 2021-05-21 ENCOUNTER — Telehealth: Payer: Self-pay

## 2021-05-21 ENCOUNTER — Other Ambulatory Visit (HOSPITAL_COMMUNITY): Payer: Self-pay

## 2021-05-21 NOTE — Telephone Encounter (Signed)
Patient Assistance application completed by patient and provider. Forms faxed to [employer] at [phone].

## 2021-06-12 ENCOUNTER — Encounter: Payer: Self-pay | Admitting: Adult Health

## 2021-06-12 ENCOUNTER — Ambulatory Visit (INDEPENDENT_AMBULATORY_CARE_PROVIDER_SITE_OTHER): Payer: HMO | Admitting: Adult Health

## 2021-06-12 VITALS — BP 150/62 | HR 80 | Temp 97.7°F | Ht 67.5 in | Wt 225.0 lb

## 2021-06-12 DIAGNOSIS — I2510 Atherosclerotic heart disease of native coronary artery without angina pectoris: Secondary | ICD-10-CM

## 2021-06-12 DIAGNOSIS — E782 Mixed hyperlipidemia: Secondary | ICD-10-CM

## 2021-06-12 DIAGNOSIS — Z1159 Encounter for screening for other viral diseases: Secondary | ICD-10-CM | POA: Diagnosis not present

## 2021-06-12 DIAGNOSIS — R351 Nocturia: Secondary | ICD-10-CM | POA: Diagnosis not present

## 2021-06-12 DIAGNOSIS — N401 Enlarged prostate with lower urinary tract symptoms: Secondary | ICD-10-CM

## 2021-06-12 DIAGNOSIS — I10 Essential (primary) hypertension: Secondary | ICD-10-CM | POA: Diagnosis not present

## 2021-06-12 DIAGNOSIS — E669 Obesity, unspecified: Secondary | ICD-10-CM

## 2021-06-12 DIAGNOSIS — Z0000 Encounter for general adult medical examination without abnormal findings: Secondary | ICD-10-CM | POA: Diagnosis not present

## 2021-06-12 DIAGNOSIS — E113393 Type 2 diabetes mellitus with moderate nonproliferative diabetic retinopathy without macular edema, bilateral: Secondary | ICD-10-CM

## 2021-06-12 LAB — COMPREHENSIVE METABOLIC PANEL
ALT: 15 U/L (ref 0–53)
AST: 13 U/L (ref 0–37)
Albumin: 4.4 g/dL (ref 3.5–5.2)
Alkaline Phosphatase: 52 U/L (ref 39–117)
BUN: 36 mg/dL — ABNORMAL HIGH (ref 6–23)
CO2: 23 mEq/L (ref 19–32)
Calcium: 10.1 mg/dL (ref 8.4–10.5)
Chloride: 106 mEq/L (ref 96–112)
Creatinine, Ser: 2.38 mg/dL — ABNORMAL HIGH (ref 0.40–1.50)
GFR: 25.38 mL/min — ABNORMAL LOW (ref >60.00)
Glucose, Bld: 131 mg/dL — ABNORMAL HIGH (ref 70–99)
Potassium: 4.8 mEq/L (ref 3.5–5.1)
Sodium: 139 mEq/L (ref 135–145)
Total Bilirubin: 0.5 mg/dL (ref 0.2–1.2)
Total Protein: 7.2 g/dL (ref 6.0–8.3)

## 2021-06-12 LAB — PSA: PSA: 1.11 ng/mL (ref 0.10–4.00)

## 2021-06-12 LAB — LIPID PANEL
Cholesterol: 96 mg/dL (ref 0–200)
HDL: 34.9 mg/dL — ABNORMAL LOW (ref >39.00)
LDL Cholesterol: 35 mg/dL (ref 0–99)
NonHDL: 61.24
Total CHOL/HDL Ratio: 3
Triglycerides: 130 mg/dL (ref 0.0–149.0)
VLDL: 26 mg/dL (ref 0.0–40.0)

## 2021-06-12 LAB — CBC WITH DIFFERENTIAL/PLATELET
Basophils Absolute: 0.1 10*3/uL (ref 0.0–0.1)
Basophils Relative: 0.8 % (ref 0.0–3.0)
Eosinophils Absolute: 0.6 10*3/uL (ref 0.0–0.7)
Eosinophils Relative: 5.6 % — ABNORMAL HIGH (ref 0.0–5.0)
HCT: 42.1 % (ref 39.0–52.0)
Hemoglobin: 13.7 g/dL (ref 13.0–17.0)
Lymphocytes Relative: 20.5 % (ref 12.0–46.0)
Lymphs Abs: 2.4 10*3/uL (ref 0.7–4.0)
MCHC: 32.6 g/dL (ref 30.0–36.0)
MCV: 88.8 fl (ref 78.0–100.0)
Monocytes Absolute: 1.1 10*3/uL — ABNORMAL HIGH (ref 0.1–1.0)
Monocytes Relative: 9.6 % (ref 3.0–12.0)
Neutro Abs: 7.4 10*3/uL (ref 1.4–7.7)
Neutrophils Relative %: 63.5 % (ref 43.0–77.0)
Platelets: 310 10*3/uL (ref 150.0–400.0)
RBC: 4.75 Mil/uL (ref 4.22–5.81)
RDW: 15.3 % (ref 11.5–15.5)
WBC: 11.6 10*3/uL — ABNORMAL HIGH (ref 4.0–10.5)

## 2021-06-12 LAB — TSH: TSH: 3.22 u[IU]/mL (ref 0.35–5.50)

## 2021-06-12 NOTE — Progress Notes (Addendum)
Subjective:    Patient ID: Eric Choi, male    DOB: 03/15/1941, 81 y.o.   MRN: [id number]  HPI  Patient presents for yearly preventative medicine examination. He is a pleasant 81 year old male who  has a past medical history of CAD (coronary artery disease), Colon polyp, DIABETES MELLITUS, TYPE II (11/03/2006), Dyspnea, HYPERLIPIDEMIA (11/03/2006), HYPERTENSION (11/03/2006), Myocardial infarction ([facility]), and PAD (peripheral artery disease) ([place]).  Hypertension-is currently prescribed Norvasc 10 mg, lisinopril 30 mg and metoprolol 50 mg.  He denies dizziness, lightheadedness, chest pain, shortness of breath, or syncopal episodes.  He does monitor his blood pressures at home and reports readings between 120 and 130 over 70s to 80s. BP Readings from Last 3 Encounters:  06/12/21 (!) 150/62  04/28/21 120/64  01/16/21 (!) 146/60   Hyperlipidemia-takes Zocor 40 mg daily and fenofibrate 145 mg daily.  He denies myalgia or fatigue Lab Results  Component Value Date   CHOL 99 01/01/2021   HDL 28 (L) 01/01/2021   LDLCALC 30 01/01/2021   LDLDIRECT 69.0 09/05/2020   TRIG 203 (H) 01/01/2021   CHOLHDL 3.5 01/01/2021    Diabetes mellitus-managed by endocrinology.  Currently prescribed Ozempic 0.1 mg weekly ( ran out of medication and cannot afford refills), insulin RR 15 to 30 units 3 times a day, Novolin and 45 to 55 units twice daily.  He does use a continuous glucose monitor.  He is in goal 81% of the time.  Lab Results  Component Value Date   HGBA1C 8.3 (A) 04/28/2021   CAD-followed by cardiology.  Had a cath in 1994 but no cath since then.  Nuclear stress in 2021 showed no ischemia.  Echo in September 2021 with LVEF equal to 50%.  Does continue to have mild dyspnea that is unchanged over the past few years.  PAD-followed by vein and vascular.  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  BPH - Asymptomatic   All immunizations and health maintenance protocols were  reviewed with the patient and needed orders were placed.  Appropriate screening laboratory values were ordered for the patient including screening of hyperlipidemia, renal function and hepatic function. If indicated by BPH, a PSA was ordered.  Medication reconciliation,  past medical history, social history, problem list and allergies were reviewed in detail with the patient  Goals were established with regard to weight loss, exercise, and  diet in compliance with medications  Wt Readings from Last 3 Encounters:  06/12/21 225 lb (102.1 kg)  04/28/21 222 lb 12.8 oz (101.1 kg)  01/16/21 226 lb (102.5 kg)    Past Medical History:  Diagnosis Date   CAD (coronary artery disease)    Colon polyp    DIABETES MELLITUS, TYPE II 11/03/2006   Dyspnea    HYPERLIPIDEMIA 11/03/2006   HYPERTENSION 11/03/2006   Myocardial infarction (HCC)    PAD (peripheral artery disease) ([place])     Social History   Socioeconomic History   Marital status: Married    Spouse name: Not on file   Number of children: 2   Years of education: Not on file   Highest education level: Not on file  Occupational History   Occupation: Retired-Truck driver  Tobacco Use   Smoking status: Former    Types: Cigarettes    Quit date: 05/18/1991    Years since quitting: 30.0   Smokeless tobacco: Never  Vaping Use   Vaping Use: Never used  Substance and Sexual Activity  Alcohol use: No   Drug use: No   Sexual activity: Not on file  Other Topics Concern   Not on file  Social History Narrative   Works 3rd shift   Regular Exercise-yes   Social Determinants of Health   Financial Resource Strain: Not on file  Food Insecurity: Not on file  Transportation Needs: Not on file  Physical Activity: Not on file  Stress: Not on file  Social Connections: Not on file  Intimate Partner Violence: Not on file    Past Surgical History:  Procedure Laterality Date   ANGIOPLASTY  20 years ago   percutaneous transluminal     CATARACT EXTRACTION  2002, 2005   bilateral   ENDARTERECTOMY Right 12/31/2020   Procedure: RIGHT CAROTID ENDARTERECTOMY;  Surgeon: Serafina Mitchell, MD;  Location: [facility];  Service: Vascular;  Laterality: Right;   PATCH ANGIOPLASTY Right 12/31/2020   Procedure: PATCH ANGIOPLASTY OF RIGHT CAROTID ARTERY USING [place];  Surgeon: Serafina Mitchell, MD;  Location: [facility];  Service: Vascular;  Laterality: Right;    Family History  Problem Relation Age of Onset   COPD Father    Alcohol abuse Father    Diabetes Father    Cancer Sister        breast   Stroke Mother    Colon cancer Neg Hx     No Known Allergies  Current Outpatient Medications on File Prior to Visit  Medication Sig Dispense Refill   acetaminophen (TYLENOL) 500 MG tablet Take 500 mg by mouth every 6 (six) hours as needed for moderate pain.     amLODipine (NORVASC) 10 MG tablet TAKE ONE TABLET BY MOUTH DAILY 90 tablet 3   aspirin 81 MG tablet Take 81 mg by mouth daily.     atorvastatin (LIPITOR) 40 MG tablet Take 1 tablet (40 mg total) by mouth daily. 90 tablet 4   Blood Glucose Monitoring Suppl (ONETOUCH VERIO) w/Device KIT 1 each by Does not apply route 4 (four) times daily. 1 kit 0   Continuous Blood Gluc Receiver (FREESTYLE LIBRE 2 READER) DEVI Use as instructed 1 each 0   Continuous Blood Gluc Sensor (FREESTYLE LIBRE 2 SENSOR) MISC Use as instructed change every 14 days 6 each 3   ezetimibe (ZETIA) 10 MG tablet TAKE ONE TABLET BY MOUTH DAILY 90 tablet 1   gemfibrozil (LOPID) 600 MG tablet TAKE ONE TABLET BY MOUTH DAILY AT 2 O'CLOCK IN THE AFTERNOON 90 tablet 0   HYDROcodone-acetaminophen (NORCO/VICODIN) 5-325 MG tablet Take 1 tablet by mouth every 4 (four) hours as needed for moderate pain. 15 tablet 0   insulin NPH Human (NOVOLIN N) 100 UNIT/ML injection INJECT 45 - 55 UNITS UNDER THE SKIN BEFORE BREAKFAST AND DINNER 30 mL 11   insulin regular (NOVOLIN R) 100 units/mL injection INJECT 15-30 UNITS  THREE TIMES DAILY BEFORE MEALS AS DIRECTED 30 mL 11   Lancets Misc. (ACCU-CHEK MULTICLIX LANCET DEV) KIT Use to check sugar 4 times daily 400 each 5   lisinopril (ZESTRIL) 30 MG tablet TAKE ONE TABLET BY MOUTH DAILY 90 tablet 3   METAMUCIL FIBER PO Take 1 Dose by mouth daily. 1 dose= 1 tablespoon     metoprolol tartrate (LOPRESSOR) 50 MG tablet TAKE ONE TABLET BY MOUTH DAILY 90 tablet 3   NEEDLE, DISP, 30 G 30G X 1" MISC by Does not apply route as directed.     ONETOUCH VERIO test strip USE TO TEST BLOOD SUGAR 4 TIMES DAILY  AS NEEDED 400 strip 10   Semaglutide (OZEMPIC, 0.25 OR 0.5 MG/DOSE, [place]) Inject 0.5 mg into the skin once a week.     simvastatin (ZOCOR) 40 MG tablet TAKE ONE TABLET BY MOUTH AT BEDTIME 90 tablet 0   No current facility-administered medications on file prior to visit.    BP (!) 150/62    Pulse 80    Temp 97.7 F (36.5 C) (Oral)    Ht 5' 7.5" (1.715 m)    Wt 225 lb (102.1 kg)    SpO2 99%    BMI 34.72 kg/m    Review of Systems  Constitutional: Negative.   HENT:  Positive for hearing loss.   Eyes: Negative.   Respiratory:  Positive for shortness of breath.   Cardiovascular: Negative.   Gastrointestinal: Negative.   Endocrine: Negative.   Genitourinary: Negative.   Musculoskeletal:  Positive for back pain.  Skin: Negative.   Allergic/Immunologic: Negative.   Neurological: Negative.   Hematological: Negative.   Psychiatric/Behavioral: Negative.    All other systems reviewed and are negative.  Past Medical History:  Diagnosis Date   CAD (coronary artery disease)    Colon polyp    DIABETES MELLITUS, TYPE II 11/03/2006   Dyspnea    HYPERLIPIDEMIA 11/03/2006   HYPERTENSION 11/03/2006   Myocardial infarction [facility])    PAD (peripheral artery disease) (HCC)     Social History   Socioeconomic History   Marital status: Married    Spouse name: Not on file   Number of children: 2   Years of education: Not on file   Highest education level: Not on file   Occupational History   Occupation: Retired-Truck driver  Tobacco Use   Smoking status: Former    Types: Cigarettes    Quit date: 05/18/1991    Years since quitting: 30.0   Smokeless tobacco: Never  Vaping Use   Vaping Use: Never used  Substance and Sexual Activity   Alcohol use: No   Drug use: No   Sexual activity: Not on file  Other Topics Concern   Not on file  Social History Narrative   Works 3rd shift   Regular Exercise-yes   Social Determinants of Health   Financial Resource Strain: Not on file  Food Insecurity: Not on file  Transportation Needs: Not on file  Physical Activity: Not on file  Stress: Not on file  Social Connections: Not on file  Intimate Partner Violence: Not on file    Past Surgical History:  Procedure Laterality Date   ANGIOPLASTY  20 years ago   percutaneous transluminal    CATARACT EXTRACTION  2002, 2005   bilateral   ENDARTERECTOMY Right 12/31/2020   Procedure: RIGHT CAROTID ENDARTERECTOMY;  Surgeon: Serafina Mitchell, MD;  Location: [facility];  Service: Vascular;  Laterality: Right;   PATCH ANGIOPLASTY Right 12/31/2020   Procedure: PATCH ANGIOPLASTY OF RIGHT CAROTID ARTERY USING [place];  Surgeon: Serafina Mitchell, MD;  Location: [facility];  Service: Vascular;  Laterality: Right;    Family History  Problem Relation Age of Onset   COPD Father    Alcohol abuse Father    Diabetes Father    Cancer Sister        breast   Stroke Mother    Colon cancer Neg Hx     No Known Allergies  Current Outpatient Medications on File Prior to Visit  Medication Sig Dispense Refill   acetaminophen (TYLENOL) 500 MG tablet Take 500 mg by mouth  every 6 (six) hours as needed for moderate pain.     amLODipine (NORVASC) 10 MG tablet TAKE ONE TABLET BY MOUTH DAILY 90 tablet 3   aspirin 81 MG tablet Take 81 mg by mouth daily.     atorvastatin (LIPITOR) 40 MG tablet Take 1 tablet (40 mg total) by mouth daily. 90 tablet 4   Blood Glucose  Monitoring Suppl (ONETOUCH VERIO) w/Device KIT 1 each by Does not apply route 4 (four) times daily. 1 kit 0   Continuous Blood Gluc Receiver (FREESTYLE LIBRE 2 READER) DEVI Use as instructed 1 each 0   Continuous Blood Gluc Sensor (FREESTYLE LIBRE 2 SENSOR) MISC Use as instructed change every 14 days 6 each 3   ezetimibe (ZETIA) 10 MG tablet TAKE ONE TABLET BY MOUTH DAILY 90 tablet 1   gemfibrozil (LOPID) 600 MG tablet TAKE ONE TABLET BY MOUTH DAILY AT 2 O'CLOCK IN THE AFTERNOON 90 tablet 0   HYDROcodone-acetaminophen (NORCO/VICODIN) 5-325 MG tablet Take 1 tablet by mouth every 4 (four) hours as needed for moderate pain. 15 tablet 0   insulin NPH Human (NOVOLIN N) 100 UNIT/ML injection INJECT 45 - 55 UNITS UNDER THE SKIN BEFORE BREAKFAST AND DINNER 30 mL 11   insulin regular (NOVOLIN R) 100 units/mL injection INJECT 15-30 UNITS THREE TIMES DAILY BEFORE MEALS AS DIRECTED 30 mL 11   Lancets Misc. (ACCU-CHEK MULTICLIX LANCET DEV) KIT Use to check sugar 4 times daily 400 each 5   lisinopril (ZESTRIL) 30 MG tablet TAKE ONE TABLET BY MOUTH DAILY 90 tablet 3   METAMUCIL FIBER PO Take 1 Dose by mouth daily. 1 dose= 1 tablespoon     metoprolol tartrate (LOPRESSOR) 50 MG tablet TAKE ONE TABLET BY MOUTH DAILY 90 tablet 3   NEEDLE, DISP, 30 G 30G X 1" MISC by Does not apply route as directed.     ONETOUCH VERIO test strip USE TO TEST BLOOD SUGAR 4 TIMES DAILY AS NEEDED 400 strip 10   Semaglutide (OZEMPIC, 0.25 OR 0.5 MG/DOSE, [place]) Inject 0.5 mg into the skin once a week.     simvastatin (ZOCOR) 40 MG tablet TAKE ONE TABLET BY MOUTH AT BEDTIME 90 tablet 0   No current facility-administered medications on file prior to visit.    BP (!) 150/62    Pulse 80    Temp 97.7 F (36.5 C) (Oral)    Ht 5' 7.5" (1.715 m)    Wt 225 lb (102.1 kg)    SpO2 99%    BMI 34.72 kg/m       Objective:   Physical Exam Vitals and nursing note reviewed.  Constitutional:      General: He is not in acute distress.     Appearance: Normal appearance. He is well-developed. He is obese.  HENT:     Head: Normocephalic and atraumatic.     Right Ear: Tympanic membrane, ear canal and external ear normal. There is no impacted cerumen.     Left Ear: Tympanic membrane, ear canal and external ear normal. There is no impacted cerumen.     Nose: Nose normal. No congestion or rhinorrhea.     Mouth/Throat:     Mouth: Mucous membranes are moist.     Pharynx: Oropharynx is clear. No oropharyngeal exudate or posterior oropharyngeal erythema.  Eyes:     General:        Right eye: No discharge.        Left eye: No discharge.     Extraocular Movements:  Extraocular movements intact.     Conjunctiva/sclera: Conjunctivae normal.     Pupils: Pupils are equal, round, and reactive to light.  Neck:     Vascular: No carotid bruit.     Trachea: No tracheal deviation.  Cardiovascular:     Rate and Rhythm: Normal rate and regular rhythm.     Pulses: Normal pulses.     Heart sounds: Normal heart sounds. No murmur heard.   No friction rub. No gallop.  Pulmonary:     Effort: Pulmonary effort is normal. No respiratory distress.     Breath sounds: Normal breath sounds. No stridor. No wheezing, rhonchi or rales.  Chest:     Chest wall: No tenderness.  Abdominal:     General: Bowel sounds are normal. There is no distension.     Palpations: Abdomen is soft. There is no mass.     Tenderness: There is no abdominal tenderness. There is no right CVA tenderness, left CVA tenderness, guarding or rebound.     Hernia: No hernia is present.  Musculoskeletal:        General: No swelling, tenderness, deformity or signs of injury. Normal range of motion.     Right lower leg: No edema.     Left lower leg: No edema.  Lymphadenopathy:     Cervical: No cervical adenopathy.  Skin:    General: Skin is warm and dry.     Capillary Refill: Capillary refill takes less than 2 seconds.     Coloration: Skin is not jaundiced or pale.     Findings: No  bruising, erythema, lesion or rash.  Neurological:     General: No focal deficit present.     Mental Status: He is alert and oriented to person, place, and time.     Cranial Nerves: No cranial nerve deficit.     Sensory: No sensory deficit.     Motor: No weakness.     Coordination: Coordination normal.     Gait: Gait normal.     Deep Tendon Reflexes: Reflexes normal.  Psychiatric:        Mood and Affect: Mood normal.        Behavior: Behavior normal.        Thought Content: Thought content normal.        Judgment: Judgment normal.      Assessment & Plan:  1. Routine general medical examination at a health care facility - Encouraged weight loss through diet and exercise - CBC with Differential/Platelet; Future - Comprehensive metabolic panel; Future - Lipid panel; Future - TSH; Future  2. Mixed hyperlipidemia - Continue with statin and fenofibrate  - CBC with Differential/Platelet; Future - Comprehensive metabolic panel; Future - Lipid panel; Future - TSH; Future  3. Coronary artery disease involving native coronary artery of native heart without angina pectoris - Continue with statin, fenofibrate, and asa  - CBC with Differential/Platelet; Future - Comprehensive metabolic panel; Future - Lipid panel; Future - TSH; Future  4. Essential hypertension - elevated today, did not take his medication this morning prior to the appointment  - CBC with Differential/Platelet; Future - Comprehensive metabolic panel; Future - Lipid panel; Future - TSH; Future  5. BPH associated with nocturia  - PSA; Future  6. Obesity with serious comorbidity, unspecified classification, unspecified obesity type - Hopefully with ozempic he will have some weight loss  - CBC with Differential/Platelet; Future - Comprehensive metabolic panel; Future - Lipid panel; Future - TSH; Future  7. 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 Need for hepatitis C screening test  - Hep C Antibody; Future  [name], NP

## 2021-06-12 NOTE — Patient Instructions (Signed)
It was great seeing you today   We will follow up with you regarding your lab work   Please let me know if you need anything   

## 2021-06-15 LAB — HEPATITIS C ANTIBODY
Hepatitis C Ab: NONREACTIVE
SIGNAL TO CUT-OFF: 0.04 (ref <1.00)

## 2021-06-18 ENCOUNTER — Other Ambulatory Visit: Payer: Self-pay | Admitting: Adult Health

## 2021-06-18 DIAGNOSIS — E785 Hyperlipidemia, unspecified: Secondary | ICD-10-CM

## 2021-06-19 ENCOUNTER — Telehealth: Payer: Self-pay | Admitting: Internal Medicine

## 2021-06-19 NOTE — Telephone Encounter (Signed)
Called and advised pt's wife pt assistance Ozempic (1 box) was delivered and ready for pick up.

## 2021-06-19 NOTE — Telephone Encounter (Signed)
Pts spouse came and picked up Pt Assistance medication of Ozempic Qty 1.  Pt stated that someone gave her call to pick it up.

## 2021-06-22 ENCOUNTER — Other Ambulatory Visit (HOSPITAL_COMMUNITY): Payer: Self-pay

## 2021-06-23 ENCOUNTER — Other Ambulatory Visit (HOSPITAL_COMMUNITY): Payer: Self-pay

## 2021-06-24 DIAGNOSIS — I129 Hypertensive chronic kidney disease with stage 1 through stage 4 chronic kidney disease, or unspecified chronic kidney disease: Secondary | ICD-10-CM | POA: Diagnosis not present

## 2021-06-24 DIAGNOSIS — N2581 Secondary hyperparathyroidism of renal origin: Secondary | ICD-10-CM | POA: Diagnosis not present

## 2021-06-24 DIAGNOSIS — N184 Chronic kidney disease, stage 4 (severe): Secondary | ICD-10-CM | POA: Diagnosis not present

## 2021-06-24 DIAGNOSIS — D631 Anemia in chronic kidney disease: Secondary | ICD-10-CM | POA: Diagnosis not present

## 2021-07-12 IMAGING — US US RENAL
1 series · 14 of 25 positions shown · non-contrast
Comparison: None.

CLINICAL DATA: Stage IV chronic renal insufficiency

EXAM:
RENAL / URINARY TRACT ULTRASOUND COMPLETE

[Series 1: us renal · 0.26mm/px · 14 of 50 slices shown]
[im 1/50]
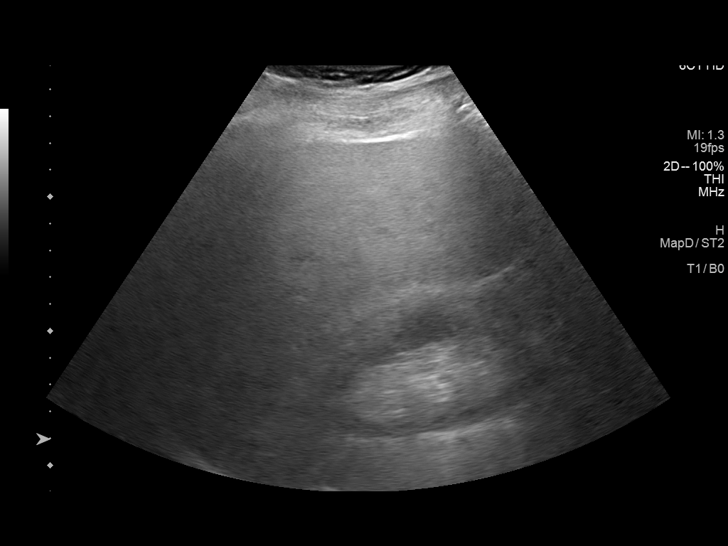
[im 5/50]
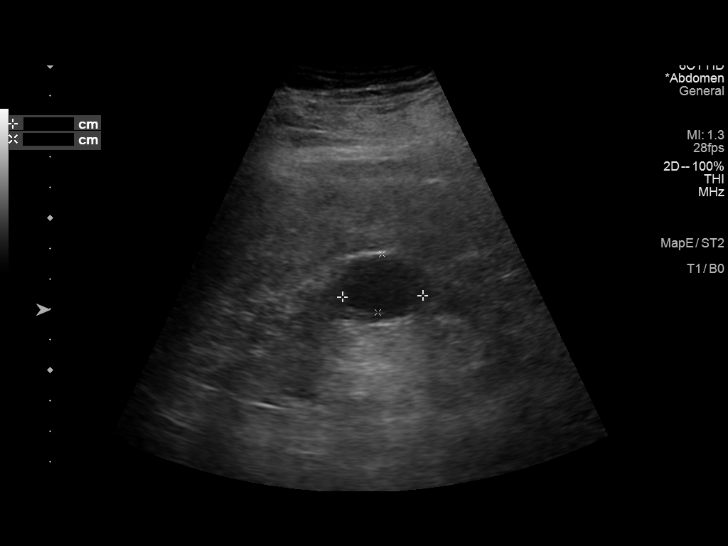
[im 9/50]
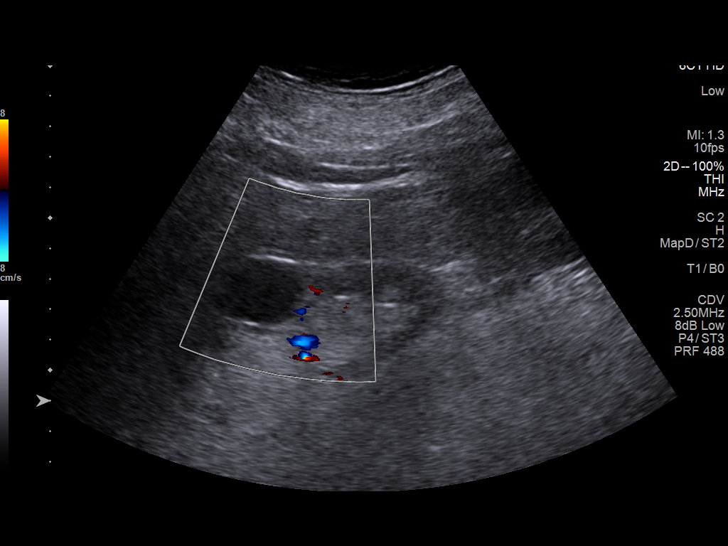
[im 13/50]
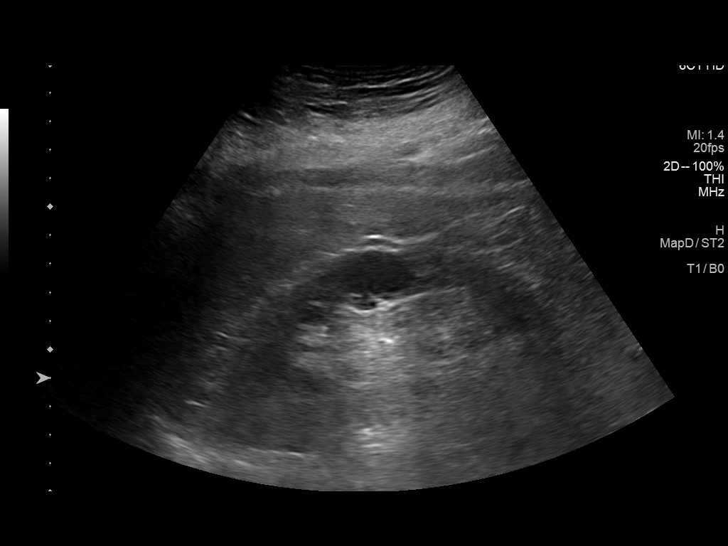
[im 17/50]
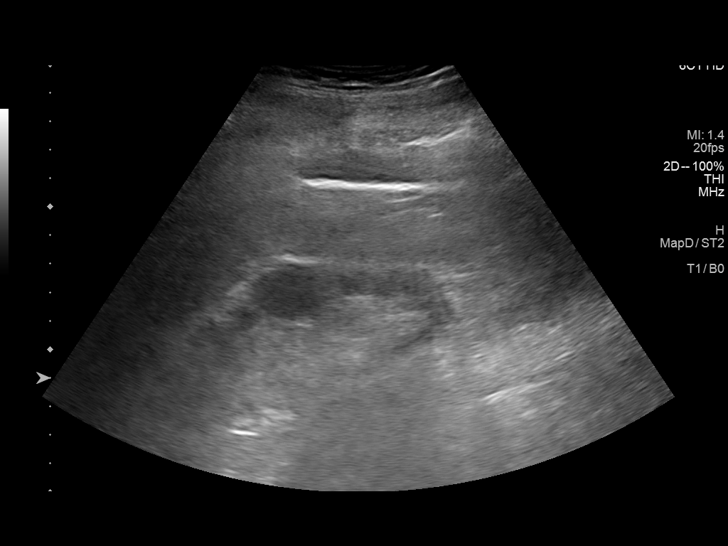
[im 19/50]
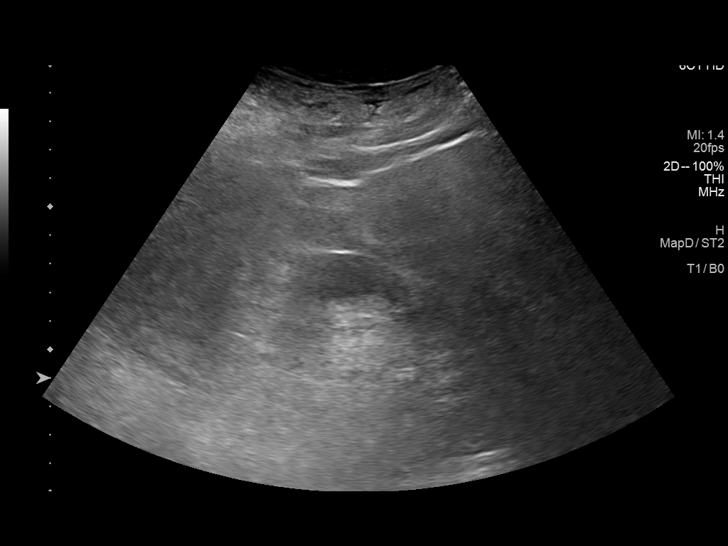
[im 23/50]
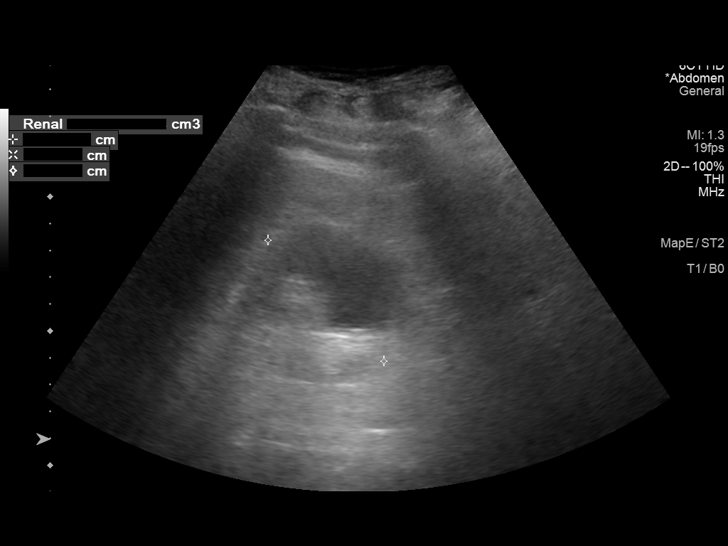
[im 27/50]
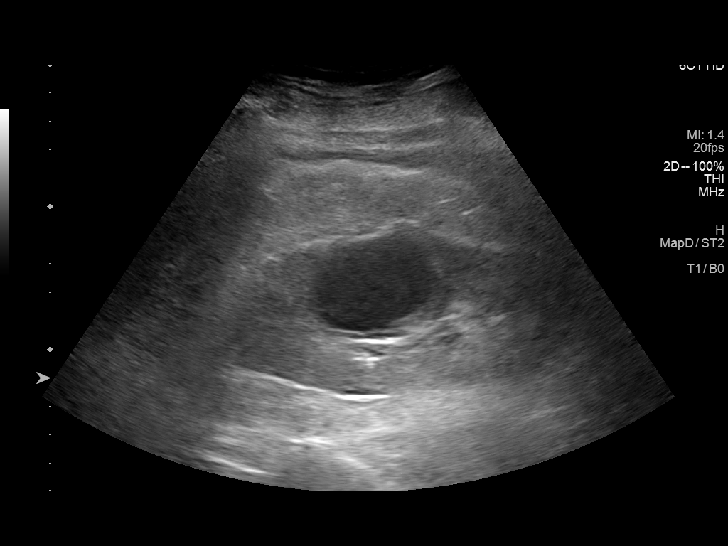
[im 31/50]
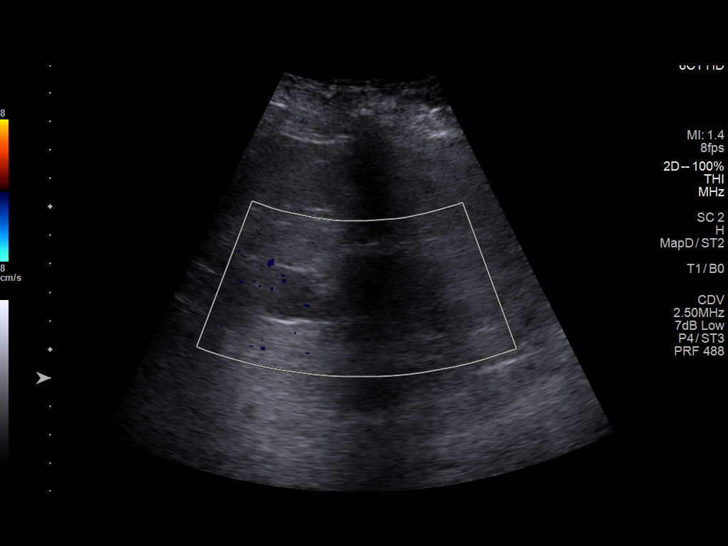
[im 33/50]
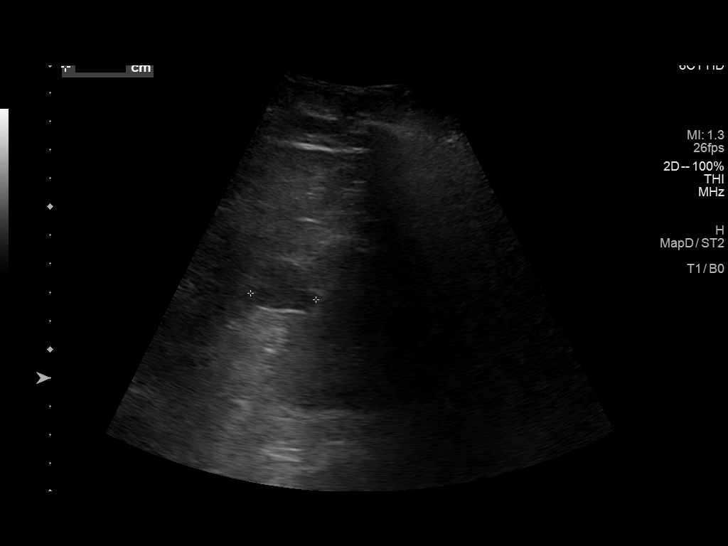
[im 37/50]
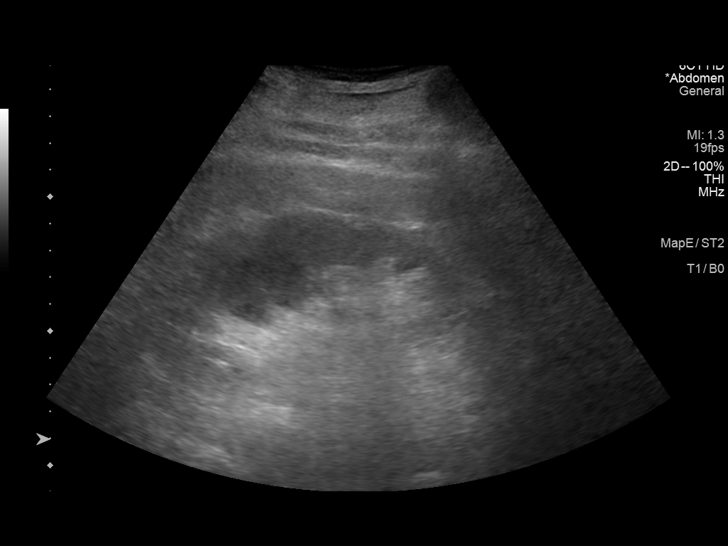
[im 41/50]
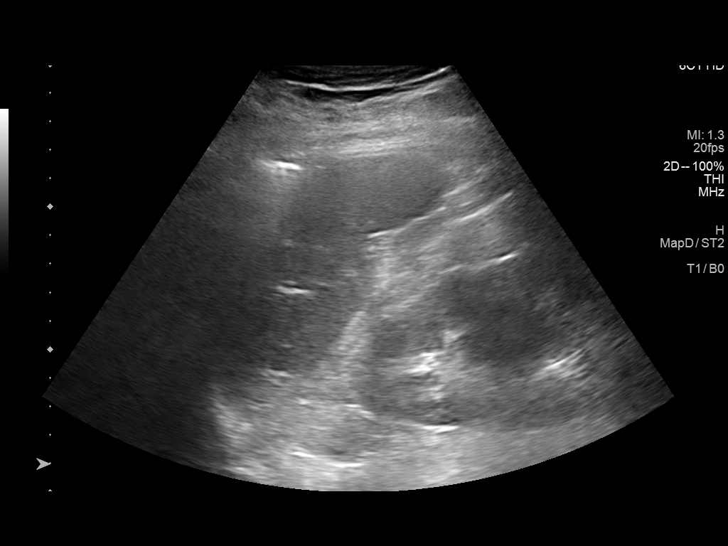
[im 45/50]
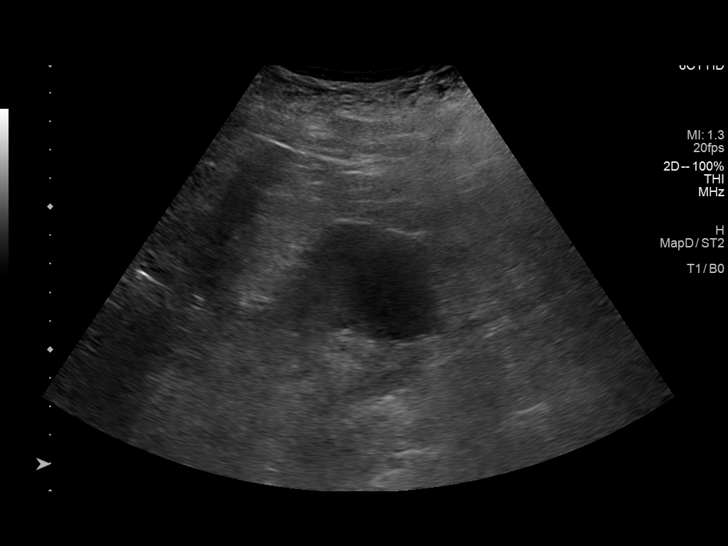
[im 50/50]
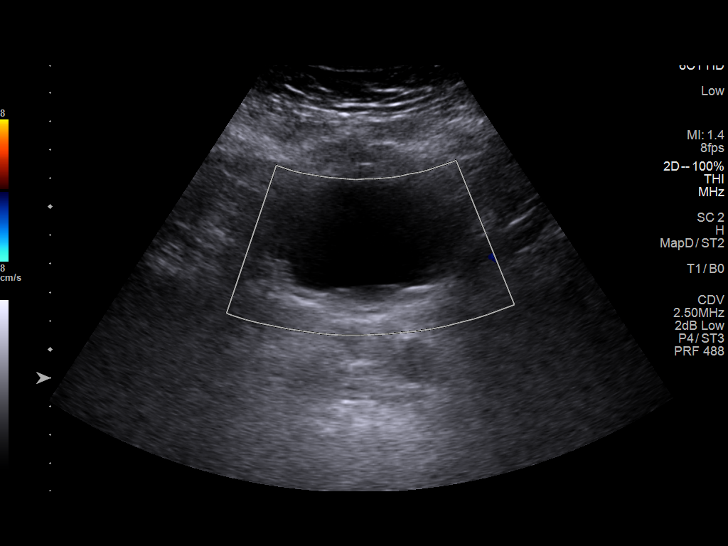

[14 of 25 positions shown; findings below may reference images not displayed]

FINDINGS: Right Kidney:

Renal measurements: 10.9 x 4.7 x 5.4 cm = volume: 145.4 mL.
Evaluation of the right kidney is limited due to body habitus.
Echotexture appears grossly unremarkable. There is a 2.6 x 1.9 x
cm cyst within the ventral mid aspect of the right kidney. A thin
septation may be visualized along the posterior aspect of the cyst.

Left Kidney:

Renal measurements: 12.6 x 6.3 x 6.2 cm = volume: 259.8 mL.
Evaluation of the left kidney is limited due to body habitus.
Echogenicity appears grossly unremarkable. There is a 4.1 x 3.2 x
3.1 cm simple appearing cysts within the lateral mid left kidney. A
smaller 2.3 x 2.0 x 1.9 cm simple cyst is seen within the upper
pole.

Bladder:

Appears normal for degree of bladder distention.

Other:

None.
IMPRESSION: 1. Limited evaluation due to body habitus.
2. Minimally complex right renal cyst, with a single thin septation
identified.
3. Multiple simple left renal cysts.
4. Normal renal cortical echotexture.

## 2021-07-13 ENCOUNTER — Ambulatory Visit (HOSPITAL_COMMUNITY)
Admission: RE | Admit: 2021-07-13 | Discharge: 2021-07-13 | Disposition: A | Discharge status: Home / Self Care | Payer: HMO | Source: Ambulatory Visit | Attending: Surgery | Admitting: Surgery

## 2021-07-13 ENCOUNTER — Ambulatory Visit: Payer: HMO | Admitting: Physician Assistant

## 2021-07-13 ENCOUNTER — Other Ambulatory Visit: Payer: Self-pay

## 2021-07-13 VITALS — BP 120/59 | HR 81 | Temp 97.5°F | Resp 20 | Ht 67.5 in | Wt 229.8 lb

## 2021-07-13 DIAGNOSIS — I6523 Occlusion and stenosis of bilateral carotid arteries: Secondary | ICD-10-CM | POA: Diagnosis not present

## 2021-07-13 NOTE — Progress Notes (Signed)
Office Note     CC:  follow up Requesting Provider:  [name], NP  HPI: Eric Choi is a 81 y.o. (10-17-1940) male who presents for follow up of carotid artery stenosis. He is s/p right CEA by Dr. Trula Slade on 12/31/20. This was performed secondary to asymptomatic high grade stenosis. He has known left ICA stenosis of 40-59%.  He denies any stroke or TIA symptoms. He denies any amaurosis fugax or other visual changes, slurred speech, facial drooping or unilateral upper or lower extremity weakness or numbness. He does report occasionally that his arms or legs will fall asleep. This is very positional and goes away when he changes position. 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 He does not have any claudication, rest pain or tissue loss. He is compliant with his statin and Aspirin.   The pt is on a statin for cholesterol management.  The pt is on a daily aspirin.   Other AC:  none The pt is on CCB, ACE,BB for hypertension.   The pt is diabetic.   Tobacco hx:  Former, 1993  Past Medical History:  Diagnosis Date   CAD (coronary artery disease)    Colon polyp    DIABETES MELLITUS, TYPE II 11/03/2006   Dyspnea    HYPERLIPIDEMIA 11/03/2006   HYPERTENSION 11/03/2006   Myocardial infarction [facility])    PAD (peripheral artery disease) (HCC)     Past Surgical History:  Procedure Laterality Date   ANGIOPLASTY  20 years ago   percutaneous transluminal    CATARACT EXTRACTION  2002, 2005   bilateral   ENDARTERECTOMY Right 12/31/2020   Procedure: RIGHT CAROTID ENDARTERECTOMY;  Surgeon: Serafina Mitchell, MD;  Location: [facility] OR;  Service: Vascular;  Laterality: Right;   PATCH ANGIOPLASTY Right 12/31/2020   Procedure: PATCH ANGIOPLASTY OF RIGHT CAROTID ARTERY USING [place];  Surgeon: Serafina Mitchell, MD;  Location: [facility] OR;  Service: Vascular;  Laterality: Right;    Social History   Socioeconomic History   Marital status: Married    Spouse  name: Not on file   Number of children: 2   Years of education: Not on file   Highest education level: Not on file  Occupational History   Occupation: Retired-Truck driver  Tobacco Use   Smoking status: Former    Types: Cigarettes    Quit date: 05/18/1991    Years since quitting: 30.1    Passive exposure: Never   Smokeless tobacco: Never  Vaping Use   Vaping Use: Never used  Substance and Sexual Activity   Alcohol use: No   Drug use: No   Sexual activity: Not on file  Other Topics Concern   Not on file  Social History Narrative   Works 3rd shift   Regular Exercise-yes   Social Determinants of Radio broadcast assistant Strain: Not on file  Food Insecurity: Not on file  Transportation Needs: Not on file  Physical Activity: Not on file  Stress: Not on file  Social Connections: Not on file  Intimate Partner Violence: Not on file    Family History  Problem Relation Age of Onset   COPD Father    Alcohol abuse Father    Diabetes Father    Cancer Sister        breast   Stroke Mother    Colon cancer Neg Hx     Current Outpatient Medications  Medication Sig Dispense Refill   acetaminophen (TYLENOL)  500 MG tablet Take 500 mg by mouth every 6 (six) hours as needed for moderate pain.     amLODipine (NORVASC) 10 MG tablet TAKE ONE TABLET BY MOUTH DAILY 90 tablet 3   aspirin 81 MG tablet Take 81 mg by mouth daily.     atorvastatin (LIPITOR) 40 MG tablet Take 1 tablet (40 mg total) by mouth daily. 90 tablet 4   Blood Glucose Monitoring Suppl (ONETOUCH VERIO) w/Device KIT 1 each by Does not apply route 4 (four) times daily. 1 kit 0   Continuous Blood Gluc Receiver (FREESTYLE LIBRE 2 READER) DEVI Use as instructed 1 each 0   Continuous Blood Gluc Sensor (FREESTYLE LIBRE 2 SENSOR) MISC Use as instructed change every 14 days 6 each 3   ezetimibe (ZETIA) 10 MG tablet TAKE ONE TABLET BY MOUTH DAILY 90 tablet 1   gemfibrozil (LOPID) 600 MG tablet TAKE ONE TABLET BY MOUTH DAILY AT 2  O'CLOCK IN THE AFTERNOON 90 tablet 0   HYDROcodone-acetaminophen (NORCO/VICODIN) 5-325 MG tablet Take 1 tablet by mouth every 4 (four) hours as needed for moderate pain. 15 tablet 0   insulin NPH Human (NOVOLIN N) 100 UNIT/ML injection INJECT 45 - 55 UNITS UNDER THE SKIN BEFORE BREAKFAST AND DINNER 30 mL 11   insulin regular (NOVOLIN R) 100 units/mL injection INJECT 15-30 UNITS THREE TIMES DAILY BEFORE MEALS AS DIRECTED 30 mL 11   Lancets Misc. (ACCU-CHEK MULTICLIX LANCET DEV) KIT Use to check sugar 4 times daily 400 each 5   lisinopril (ZESTRIL) 30 MG tablet TAKE ONE TABLET BY MOUTH DAILY 90 tablet 3   METAMUCIL FIBER PO Take 1 Dose by mouth daily. 1 dose= 1 tablespoon     metoprolol tartrate (LOPRESSOR) 50 MG tablet TAKE ONE TABLET BY MOUTH DAILY 90 tablet 3   NEEDLE, DISP, 30 G 30G X 1" MISC by Does not apply route as directed.     ONETOUCH VERIO test strip USE TO TEST BLOOD SUGAR 4 TIMES DAILY AS NEEDED 400 strip 10   Semaglutide (OZEMPIC, 0.25 OR 0.5 MG/DOSE, [place]) Inject 0.5 mg into the skin once a week.     simvastatin (ZOCOR) 40 MG tablet TAKE ONE TABLET BY MOUTH AT BEDTIME 90 tablet 0   No current facility-administered medications for this visit.    No Known Allergies   REVIEW OF SYSTEMS:   [X]  denotes positive finding, [ ]  denotes negative finding Cardiac  Comments:  Chest pain or chest pressure:    Shortness of breath upon exertion:    Short of breath when lying flat:    Irregular heart rhythm:        Vascular    Pain in calf, thigh, or hip brought on by ambulation:    Pain in feet at night that wakes you up from your sleep:     Blood clot in your veins:    Leg swelling:         Pulmonary    Oxygen at home:    Productive cough:     Wheezing:         Neurologic    Sudden weakness in arms or legs:     Sudden numbness in arms or legs:     Sudden onset of difficulty speaking or slurred speech:    Temporary loss of vision in one eye:     Problems with dizziness:          Gastrointestinal    Blood in stool:     Vomited  blood:         Genitourinary    Burning when urinating:     Blood in urine:        Psychiatric    Major depression:         Hematologic    Bleeding problems:    Problems with blood clotting too easily:        Skin    Rashes or ulcers:        Constitutional    Fever or chills:      PHYSICAL EXAMINATION:  Vitals:   07/13/21 1403 07/13/21 1404  BP: (!) 118/57 (!) 120/59  Pulse: 81   Resp: 20   Temp: (!) 97.5 F (36.4 C)   TempSrc: Temporal   SpO2: 97%   Weight: 229 lb 12.8 oz (104.2 kg)   Height: 5' 7.5" (1.715 m)     General:  WDWN in NAD; vital signs documented above Gait: Normal HENT: WNL, normocephalic Pulmonary: normal non-labored breathing , without wheezing Cardiac: regular HR, without  Murmurs without carotid bruit Abdomen: obese, soft, NT, no masses Vascular Exam/Pulses:  Right Left  Radial 2+ (normal) 2+ (normal)  Femoral 2+ (normal) 2+ (normal)  Popliteal Not palpable Not palpable  DP 2+ (normal) 2+ (normal)  PT 2+ (normal) 2+ (normal)   Extremities: without ischemic changes, without Gangrene , without cellulitis; without open wounds;  Musculoskeletal: no muscle wasting or atrophy  Neurologic: A&O X 3;  No focal weakness or paresthesias are detected Psychiatric:  The pt has Normal affect.   Non-Invasive Vascular Imaging:   VAS US Carotid Duplex: 07/13/21 Summary:  Right Carotid: Patent carotid endarterectomy site with velocity in the right ICA consistent with a 1-39% stenosis.   Left Carotid: Velocities in the left ICA are consistent with a 40-59% stenosis.   Vertebrals:  Bilateral vertebral arteries demonstrate antegrade flow.  Subclavians: Normal flow hemodynamics were seen in bilateral subclavian arteries.     ASSESSMENT/PLAN:: 81 y.o. male here for follow up for carotid artery disease. He is s/p right CEA by Dr. Trula Slade on 12/31/20. This was performed secondary to asymptomatic high grade  stenosis. He has known left ICA stenosis of 40-59%. His duplex shows patent right ICA s/p endarterectomy with 1-39% stenosis. Left ICA stenosis unchanged with 40-59% stenosis. Normal flow in vertebral and subclavian arteries bilaterally - Continue Aspirin and statin - reviewed signs and symptoms of TIA and stroke and he understands should this occur to seek immediate medical attention - he will follow up in 1 year with repeat carotid duplex   Karoline Caldwell, PA-C [facility] [phone]  Clinic MD:  Dr. Trula Slade

## 2021-07-23 ENCOUNTER — Other Ambulatory Visit (HOSPITAL_COMMUNITY): Payer: Self-pay

## 2021-07-24 ENCOUNTER — Other Ambulatory Visit (HOSPITAL_COMMUNITY): Payer: Self-pay

## 2021-08-02 ENCOUNTER — Other Ambulatory Visit: Payer: Self-pay | Admitting: Adult Health

## 2021-08-06 ENCOUNTER — Encounter: Payer: Self-pay | Admitting: Internal Medicine

## 2021-08-06 ENCOUNTER — Ambulatory Visit: Payer: HMO | Admitting: Internal Medicine

## 2021-08-06 ENCOUNTER — Other Ambulatory Visit: Payer: Self-pay

## 2021-08-06 VITALS — BP 130/66 | HR 76 | Ht 67.5 in | Wt 232.2 lb

## 2021-08-06 DIAGNOSIS — E785 Hyperlipidemia, unspecified: Secondary | ICD-10-CM

## 2021-08-06 DIAGNOSIS — E1159 Type 2 diabetes mellitus with other circulatory complications: Secondary | ICD-10-CM

## 2021-08-06 DIAGNOSIS — E1165 Type 2 diabetes mellitus with hyperglycemia: Secondary | ICD-10-CM

## 2021-08-06 DIAGNOSIS — E669 Obesity, unspecified: Secondary | ICD-10-CM

## 2021-08-06 DIAGNOSIS — Z6834 Body mass index (BMI) 34.0-34.9, adult: Secondary | ICD-10-CM

## 2021-08-06 DIAGNOSIS — R7989 Other specified abnormal findings of blood chemistry: Secondary | ICD-10-CM | POA: Diagnosis not present

## 2021-08-06 LAB — POCT GLYCOSYLATED HEMOGLOBIN (HGB A1C): Hemoglobin A1C: 8.8 % — AB (ref 4.0–5.6)

## 2021-08-06 NOTE — Patient Instructions (Addendum)
Please continue: ?Insulin Before breakfast Before lunch Before dinner Bedtime  ?Regular 30-35 units 15-20 units 35-38 units -  ?NPH 35 units 15 units  - 45 units  ? ?If you do have a snack at night, may need to cover this with 5-10 units of regular insulin (R). ? ?Please come back for a follow-up appointment in 3-4 months.  ?

## 2021-08-06 NOTE — Progress Notes (Signed)
Patient ID: Eric Choi, male   DOB: 07-23-1940, 81 y.o.   MRN: [id number]  This visit occurred during the SARS-CoV-2 public health emergency.  Safety protocols were in place, including screening questions prior to the visit, additional usage of staff PPE, and extensive cleaning of exam room while observing appropriate contact time as indicated for disinfecting solutions.  HPI: Eric Choi is a 81 y.o.-year-old male, returning for f/u for DM2 dx 1990, insulin-dependent since 2005, uncontrolled, with complications (CAD, CKD, PN, DR, PVD). Last visit was 3 months ago.  Interim history: No increased urination, blurry vision, nausea, chest pain.  Reviewed HbA1c levels: Lab Results  Component Value Date   HGBA1C 8.3 (A) 04/28/2021   HGBA1C 8.8 (H) 12/26/2020   HGBA1C 8.3 (A) 08/26/2020   He is on: Insulin Before breakfast Before lunch Before dinner Bedtime  Regular 30-35 units 10-20 units 25-30 units -  NPH 45 units  -  - 45 units   -   >> did not get it from the PAP  We tried U500 insulin but this was too expensive. He did not start Trulicity due to price - 145$.  Pt checks his sugars  more than 4 times a day with his freestyle libre 2 CGM:   Previously:   Lowest CBG: 55 >> 52 >> 42 x1 >> 80; he has hypoglycemia awareness in the 90s. Highest CBG: 444 >> 300 x1 >> 332 >> 300s.  Pt's meals are: - Breakfast: cereal + milk + eggs + bacon/sausage - Lunch: egg sandwichTy!  - Dinner: meat + veggies + starch - Snacks: 1-2: including after dinner; pears + mayonnaise, milk + crackers; cake  -+ CKD, last BUN/creatinine:  Lab Results  Component Value Date   BUN 36 (H) 06/12/2021   CREATININE 2.38 (H) 06/12/2021  On lisinopril.  -+ HL; last set of lipids: Lab Results  Component Value Date   CHOL 96 06/12/2021   HDL 34.90 (L) 06/12/2021   LDLCALC 35 06/12/2021   LDLDIRECT 69.0 09/05/2020   TRIG 130.0 06/12/2021   CHOLHDL 3 06/12/2021  On Zocor 40, fenofibrate 145.    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 Dr. Radene Ou.    - + numbness and tingling in his feet.  Foot exam up-to-date from 05/2021.  He also has HTN. He had right carotid endarterectomy 12/31/2020. He has a history of claudication, which improved.  TSH is slightly elevated in the past but it normalized: Lab Results  Component Value Date   TSH 3.22 06/12/2021   TSH 3.67 08/26/2020   TSH 4.52 (H) 06/11/2020   TSH 4.18 06/05/2019   TSH 3.61 09/08/2018   TSH 4.64 (H) 03/09/2018   TSH 2.90 03/11/2017   TSH 2.69 03/05/2016   TSH 1.62 03/12/2015   TSH 4.04 02/01/2014   TSH 2.38 10/06/2010   TSH 2.80 12/27/2008   He lost his twin brother to cancer 05/2018.  ROS: see HPI  Neurological: no tremors/+ numbness/+ tingling/no dizziness  I reviewed pt's medications, allergies, PMH, social hx, family hx, and changes were documented in the history of present illness. Otherwise, unchanged from my initial visit note.  Past Medical History:  Diagnosis Date   CAD (coronary artery disease)    Colon polyp    DIABETES MELLITUS, TYPE II 11/03/2006   Dyspnea    HYPERLIPIDEMIA 11/03/2006   HYPERTENSION 11/03/2006   Myocardial infarction [facility])    PAD (peripheral artery disease) (HCC)  Past Surgical History:  Procedure Laterality Date   ANGIOPLASTY  20 years ago   percutaneous transluminal    CATARACT EXTRACTION  2002, 2005   bilateral   ENDARTERECTOMY Right 12/31/2020   Procedure: RIGHT CAROTID ENDARTERECTOMY;  Surgeon: Serafina Mitchell, MD;  Location: [facility] OR;  Service: Vascular;  Laterality: Right;   PATCH ANGIOPLASTY Right 12/31/2020   Procedure: PATCH ANGIOPLASTY OF RIGHT CAROTID ARTERY USING XENOSURE BOVINE PERICARDIUM PATCH;  Surgeon: Serafina Mitchell, MD;  Location: [facility] OR;  Service: Vascular;  Laterality: Right;   Social History   Socioeconomic History   Marital status: Married    Spouse name: Not on file   Number of children: 2   Years of  education: Not on file   Highest education level: Not on file  Occupational History   Occupation: Retired-Truck driver  Tobacco Use   Smoking status: Former    Types: Cigarettes    Quit date: 05/18/1991    Years since quitting: 30.2    Passive exposure: Never   Smokeless tobacco: Never  Vaping Use   Vaping Use: Never used  Substance and Sexual Activity   Alcohol use: No   Drug use: No   Sexual activity: Not on file  Other Topics Concern   Not on file  Social History Narrative   Works 3rd shift   Regular Exercise-yes   Social Determinants of Radio broadcast assistant Strain: Not on file  Food Insecurity: Not on file  Transportation Needs: Not on file  Physical Activity: Not on file  Stress: Not on file  Social Connections: Not on file  Intimate Partner Violence: Not on file   Current Outpatient Medications on File Prior to Visit  Medication Sig Dispense Refill   acetaminophen (TYLENOL) 500 MG tablet Take 500 mg by mouth every 6 (six) hours as needed for moderate pain.     amLODipine (NORVASC) 10 MG tablet TAKE ONE TABLET BY MOUTH DAILY 90 tablet 3   aspirin 81 MG tablet Take 81 mg by mouth daily.     atorvastatin (LIPITOR) 40 MG tablet Take 1 tablet (40 mg total) by mouth daily. 90 tablet 4   Blood Glucose Monitoring Suppl (ONETOUCH VERIO) w/Device KIT 1 each by Does not apply route 4 (four) times daily. 1 kit 0   Continuous Blood Gluc Receiver (FREESTYLE LIBRE 2 READER) DEVI Use as instructed 1 each 0   Continuous Blood Gluc Sensor (FREESTYLE LIBRE 2 SENSOR) MISC Use as instructed change every 14 days 6 each 3   ezetimibe (ZETIA) 10 MG tablet TAKE ONE TABLET BY MOUTH DAILY 90 tablet 1   gemfibrozil (LOPID) 600 MG tablet TAKE ONE TABLET BY MOUTH DAILY AT TWO O'CLOCK IN THE AFTERNOON 90 tablet 0   HYDROcodone-acetaminophen (NORCO/VICODIN) 5-325 MG tablet Take 1 tablet by mouth every 4 (four) hours as needed for moderate pain. 15 tablet 0   insulin NPH Human (NOVOLIN N) 100  UNIT/ML injection INJECT 45 - 55 UNITS UNDER THE SKIN BEFORE BREAKFAST AND DINNER 30 mL 11   insulin regular (NOVOLIN R) 100 units/mL injection INJECT 15-30 UNITS THREE TIMES DAILY BEFORE MEALS AS DIRECTED 30 mL 11   Lancets Misc. (ACCU-CHEK MULTICLIX LANCET DEV) KIT Use to check sugar 4 times daily 400 each 5   lisinopril (ZESTRIL) 30 MG tablet TAKE ONE TABLET BY MOUTH DAILY 90 tablet 3   METAMUCIL FIBER PO Take 1 Dose by mouth daily. 1 dose= 1 tablespoon     metoprolol tartrate (LOPRESSOR)  50 MG tablet TAKE ONE TABLET BY MOUTH DAILY 90 tablet 3   NEEDLE, DISP, 30 G 30G X 1" MISC by Does not apply route as directed.     ONETOUCH VERIO test strip USE TO TEST BLOOD SUGAR 4 TIMES DAILY AS NEEDED 400 strip 10   Semaglutide (OZEMPIC, 0.25 OR 0.5 MG/DOSE, [place]) Inject 0.5 mg into the skin once a week.     simvastatin (ZOCOR) 40 MG tablet TAKE ONE TABLET BY MOUTH AT BEDTIME 90 tablet 0   No current facility-administered medications on file prior to visit.   No Known Allergies Family History  Problem Relation Age of Onset   COPD Father    Alcohol abuse Father    Diabetes Father    Cancer Sister        breast   Stroke Mother    Colon cancer Neg Hx     PE: BP 130/66 (BP Location: Left Arm, Patient Position: Sitting, Cuff Size: Normal)   Pulse 76   Ht 5' 7.5" (1.715 m)   Wt 232 lb 3.2 oz (105.3 kg)   SpO2 97%   BMI 35.83 kg/m    Wt Readings from Last 3 Encounters:  08/06/21 232 lb 3.2 oz (105.3 kg)  07/13/21 229 lb 12.8 oz (104.2 kg)  06/12/21 225 lb (102.1 kg)   Constitutional: overweight, in NAD Eyes: PERRLA, EOMI, no exophthalmos ENT: moist mucous membranes, no thyromegaly, no cervical lymphadenopathy Cardiovascular: RRR, No MRG Respiratory: CTA B Musculoskeletal: no deformities, strength intact in all 4 Skin: moist, warm, no rashes Neurological: no tremor with outstretched hands, DTR normal in all 4  ASSESSMENT: 1. DM2, uncontrolled, insulin-dep, with complications - CAD -  CKD - PN - DR  - Tried to obtain U500 insulin but this was too expensive >> we had to go back to N and R insulin  2. HL  3.  Obesity class 1  4.  Elevated TSH  PLAN:  1. Patient with longstanding, uncontrolled, type 2 diabetes, on basal/bolus insulin regimen with NPH and regular insulin due to cost.  In the past, he was getting Ozempic through the patient assistance program but in the last year, he was not able to receive shipments.  At last visit, only again discussed about trying to obtain this again, but he was still not able to obtain it.  At that time, HbA1c was slightly lower, at 8.3%, but still above target.  Sugars are better controlled overnight but they were increasing significantly after breakfast.  They were better when he was active after the meal.  I advised him to increase his NPH in the morning by 5 units and to take regular insulin before dinner while NPH at that time.  At that time, he was splitting the regular insulin between before dinner and bedtime.  To avoid lows overnight, we also decreased his NPH at bedtime slightly. CGM interpretation: -At today's visit, we reviewed his CGM downloads: It appears that 57% of values are in target range (goal >70%), while 43% are higher than 180 (goal <25%), and 0% are lower than 70 (goal <4%).  The calculated average blood sugar is 179.  The projected HbA1c for the next 3 months (GMI) is 7.6%. -Reviewing the CGM trends, it appears that his sugars are high overnight, they improve after breakfast but they increase significantly after lunch and especially after dinner.  Therefore, at today's visit I advised him to increase his NPH before breakfast, also add a dose of NPH before lunch  and increase his regular insulin before dinner.  For now, unfortunately, we need to continue without Ozempic -I advised him to: Patient Instructions  Please continue: Insulin Before breakfast Before lunch Before dinner Bedtime  Regular 30-35 units 15-20 units  35-38 units -  NPH 35 units 15 units  - 45 units   If you do have a snack at night, may need to cover this with 5-10 units of regular insulin (R).  Please come back for a follow-up appointment in 3-4 months.   - we checked his HbA1c: 8.8% (slightly higher) - advised to check sugars at different times of the day - 4x a day, rotating check times - advised for yearly eye exams >> he is UTD - return to clinic in 3-4 months   2. HL -Reviewed latest lipid panel from 05/2021: Fractions at goal with the exception of a slightly low HDL.  He had triglycerides in the 500s before, now well controlled: Lab Results  Component Value Date   CHOL 96 06/12/2021   HDL 34.90 (L) 06/12/2021   LDLCALC 35 06/12/2021   LDLDIRECT 69.0 09/05/2020   TRIG 130.0 06/12/2021   CHOLHDL 3 06/12/2021  -He continues on Zocor 40 mg daily and fenofibrate 145 mg daily without side effects.  Previously also seen in the lipid clinic.  3.  Obesity class 1 -Before the coronavirus pandemic, he was exercising at [employer], but he stopped exercising afterwards due to back pain and sciatica and also leg weakness -He continues on Ozempic which should also help with weight loss -He gained 9 pounds before the last visit combined -He gained another 10 pounds since last visit!  4.  Elevated TSH -She had a slightly elevated TSH in 05/2020, however, repeat TSH was normal 2 months ago: Lab Results  Component Value Date   TSH 3.22 06/12/2021  -No signs or symptoms of hypothyroidism  Philemon Kingdom, MD PhD [facility]

## 2021-08-18 ENCOUNTER — Telehealth: Payer: Self-pay

## 2021-08-18 NOTE — Telephone Encounter (Signed)
Called and lvm for pt advising Patient assistance Ozempic (4 boxes) have been delivered and is ready for pick up. ?

## 2021-08-25 ENCOUNTER — Other Ambulatory Visit (HOSPITAL_COMMUNITY): Payer: Self-pay

## 2021-09-10 ENCOUNTER — Other Ambulatory Visit: Payer: Self-pay | Admitting: Adult Health

## 2021-09-29 ENCOUNTER — Other Ambulatory Visit (HOSPITAL_COMMUNITY): Payer: Self-pay

## 2021-10-08 ENCOUNTER — Other Ambulatory Visit: Payer: Self-pay | Admitting: Adult Health

## 2021-10-16 ENCOUNTER — Other Ambulatory Visit: Payer: Self-pay | Admitting: Nephrology

## 2021-10-16 DIAGNOSIS — D631 Anemia in chronic kidney disease: Secondary | ICD-10-CM | POA: Diagnosis not present

## 2021-10-16 DIAGNOSIS — N281 Cyst of kidney, acquired: Secondary | ICD-10-CM | POA: Diagnosis not present

## 2021-10-16 DIAGNOSIS — N184 Chronic kidney disease, stage 4 (severe): Secondary | ICD-10-CM | POA: Diagnosis not present

## 2021-10-16 DIAGNOSIS — E1122 Type 2 diabetes mellitus with diabetic chronic kidney disease: Secondary | ICD-10-CM

## 2021-10-16 DIAGNOSIS — I129 Hypertensive chronic kidney disease with stage 1 through stage 4 chronic kidney disease, or unspecified chronic kidney disease: Secondary | ICD-10-CM | POA: Diagnosis not present

## 2021-10-28 ENCOUNTER — Ambulatory Visit
Admission: RE | Admit: 2021-10-28 | Discharge: 2021-10-28 | Disposition: A | Discharge status: Home / Self Care | Payer: HMO | Source: Ambulatory Visit | Attending: Nephrology | Admitting: Nephrology

## 2021-10-28 DIAGNOSIS — D631 Anemia in chronic kidney disease: Secondary | ICD-10-CM

## 2021-10-28 DIAGNOSIS — N281 Cyst of kidney, acquired: Secondary | ICD-10-CM

## 2021-10-28 DIAGNOSIS — E1122 Type 2 diabetes mellitus with diabetic chronic kidney disease: Secondary | ICD-10-CM

## 2021-10-28 DIAGNOSIS — N184 Chronic kidney disease, stage 4 (severe): Secondary | ICD-10-CM

## 2021-10-28 DIAGNOSIS — N189 Chronic kidney disease, unspecified: Secondary | ICD-10-CM | POA: Diagnosis not present

## 2021-10-28 DIAGNOSIS — I129 Hypertensive chronic kidney disease with stage 1 through stage 4 chronic kidney disease, or unspecified chronic kidney disease: Secondary | ICD-10-CM

## 2021-10-30 DIAGNOSIS — N184 Chronic kidney disease, stage 4 (severe): Secondary | ICD-10-CM | POA: Diagnosis not present

## 2021-11-02 ENCOUNTER — Other Ambulatory Visit: Payer: Self-pay | Admitting: Internal Medicine

## 2021-11-02 ENCOUNTER — Other Ambulatory Visit (HOSPITAL_COMMUNITY): Payer: Self-pay

## 2021-11-02 MED ORDER — INSULIN NPH (HUMAN) (ISOPHANE) 100 UNIT/ML ~~LOC~~ SUSP
SUBCUTANEOUS | 11 refills | Status: DC
  Filled 2021-11-02: qty 30, 27d supply, fill #0
  Filled 2021-12-07: qty 30, 27d supply, fill #1

## 2021-11-03 ENCOUNTER — Other Ambulatory Visit (HOSPITAL_COMMUNITY): Payer: Self-pay

## 2021-11-04 ENCOUNTER — Other Ambulatory Visit (HOSPITAL_COMMUNITY): Payer: Self-pay

## 2021-11-18 ENCOUNTER — Other Ambulatory Visit: Payer: Self-pay | Admitting: Internal Medicine

## 2021-11-19 ENCOUNTER — Telehealth: Payer: Self-pay

## 2021-11-19 NOTE — Telephone Encounter (Signed)
Pt contacted and advised patient assistance Ozempic has been delivered and is ready for pick up.

## 2021-11-20 NOTE — Telephone Encounter (Signed)
Patient has now picked up his patient assistance

## 2021-12-07 ENCOUNTER — Other Ambulatory Visit: Payer: Self-pay | Admitting: Internal Medicine

## 2021-12-07 ENCOUNTER — Other Ambulatory Visit (HOSPITAL_COMMUNITY): Payer: Self-pay

## 2021-12-07 MED ORDER — INSULIN REGULAR HUMAN 100 UNIT/ML IJ SOLN
INTRAMUSCULAR | 2 refills | Status: DC
  Filled 2021-12-07: qty 30, 33d supply, fill #0

## 2021-12-08 ENCOUNTER — Other Ambulatory Visit (HOSPITAL_COMMUNITY): Payer: Self-pay

## 2021-12-09 ENCOUNTER — Other Ambulatory Visit (HOSPITAL_COMMUNITY): Payer: Self-pay

## 2021-12-09 ENCOUNTER — Ambulatory Visit (INDEPENDENT_AMBULATORY_CARE_PROVIDER_SITE_OTHER): Payer: PPO | Admitting: Internal Medicine

## 2021-12-09 ENCOUNTER — Encounter: Payer: Self-pay | Admitting: Internal Medicine

## 2021-12-09 VITALS — BP 120/76 | HR 76 | Ht 67.5 in | Wt 228.0 lb

## 2021-12-09 DIAGNOSIS — E1159 Type 2 diabetes mellitus with other circulatory complications: Secondary | ICD-10-CM | POA: Diagnosis not present

## 2021-12-09 DIAGNOSIS — E785 Hyperlipidemia, unspecified: Secondary | ICD-10-CM

## 2021-12-09 DIAGNOSIS — E1165 Type 2 diabetes mellitus with hyperglycemia: Secondary | ICD-10-CM

## 2021-12-09 DIAGNOSIS — E669 Obesity, unspecified: Secondary | ICD-10-CM | POA: Diagnosis not present

## 2021-12-09 DIAGNOSIS — Z6834 Body mass index (BMI) 34.0-34.9, adult: Secondary | ICD-10-CM

## 2021-12-09 DIAGNOSIS — R7989 Other specified abnormal findings of blood chemistry: Secondary | ICD-10-CM | POA: Diagnosis not present

## 2021-12-09 LAB — POCT GLYCOSYLATED HEMOGLOBIN (HGB A1C): Hemoglobin A1C: 8.1 % — AB (ref 4.0–5.6)

## 2021-12-09 MED ORDER — SEMAGLUTIDE (1 MG/DOSE) 4 MG/3ML ~~LOC~~ SOPN: 1.0000 mg | PEN_INJECTOR | SUBCUTANEOUS | 3 refills | Status: DC

## 2021-12-09 MED ORDER — NOVOLOG FLEXPEN 100 UNIT/ML ~~LOC~~ SOPN: 15.0000 [IU] | PEN_INJECTOR | Freq: Three times a day (TID) | SUBCUTANEOUS | 3 refills | Status: DC

## 2021-12-09 MED ORDER — LEVEMIR FLEXPEN 100 UNIT/ML ~~LOC~~ SOPN
70.0000 [IU] | PEN_INJECTOR | Freq: Every day | SUBCUTANEOUS | 3 refills | Status: DC
Start: 2021-12-09 — End: 2021-12-23

## 2021-12-09 NOTE — Progress Notes (Signed)
Patient ID: Eric Choi, male   DOB: 1941-01-24, 81 y.o.   MRN: [id number]  HPI: Eric Choi is a 81 y.o.-year-old male, returning for f/u for DM2 dx 81 1988, insulin-dependent since 2005, uncontrolled, with complications (CAD, CKD, PN, DR, PVD). Last visit was 4 months ago.  Interim history: No increased urination, blurry vision, nausea, chest pain.  Reviewed HbA1c levels: Lab Results  Component Value Date   HGBA1C 8.8 (A) 08/06/2021   HGBA1C 8.3 (A) 04/28/2021   HGBA1C 8.8 (H) 12/26/2020   He is on: Insulin Before breakfast Before lunch Before dinner Bedtime  Regular 30-35 units 15-20 units 35-38 units -  NPH 35 units -  - 45 units    -Ozempic 0.5 mg weekly (PAP) - restarted since last visit  We tried U500 insulin but this was too expensive. He did not start Trulicity due to price - 145$.  Pt checks his sugars  more than 4 times a day with his freestyle libre 2 CGM:   Previously:   Previously:   Lowest CBG: 42 x1 >> 80 >> 50s; he has hypoglycemia awareness in the 90s. Highest CBG: 444 >> .Marland KitchenMarland Kitchen300s >> >254.  Pt's meals are: - Breakfast: cereal + milk + eggs + bacon/sausage - Lunch: egg sandwichTy!  - Dinner: meat + veggies + starch - Snacks: 1-2: including after dinner; pears + mayonnaise, milk + crackers; cake  -+ CKD, last BUN/creatinine:  10/16/2021: 30/1.97, GFR 34, glucose 203 Lab Results  Component Value Date   BUN 36 (H) 06/12/2021   CREATININE 2.38 (H) 06/12/2021  On lisinopril.  -+ HL; last set of lipids: Lab Results  Component Value Date   CHOL 96 06/12/2021   HDL 34.90 (L) 06/12/2021   LDLCALC 35 06/12/2021   LDLDIRECT 69.0 09/05/2020   TRIG 130.0 06/12/2021   CHOLHDL 3 06/12/2021  On Lipitor 40, gemfibrozil 600, Zetia 10.    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 Dr. Radene Ou.    - + numbness and tingling in his feet.  Foot exam up-to-date from 05/2021.  He also has HTN. He had right carotid  endarterectomy 12/31/2020. He has a history of claudication, which improved.  TSH is slightly elevated in the past but it normalized: Lab Results  Component Value Date   TSH 3.22 06/12/2021   TSH 3.67 08/26/2020   TSH 4.52 (H) 06/11/2020   TSH 4.18 06/05/2019   TSH 3.61 09/08/2018   TSH 4.64 (H) 03/09/2018   TSH 2.90 03/11/2017   TSH 2.69 03/05/2016   TSH 1.62 03/12/2015   TSH 4.04 02/01/2014   TSH 2.38 10/06/2010   TSH 2.80 12/27/2008   He lost his twin brother to cancer 05/2018.  ROS: see HPI  Neurological: no tremors/+ numbness/+ tingling/no dizziness  I reviewed pt's medications, allergies, PMH, social hx, family hx, and changes were documented in the history of present illness. Otherwise, unchanged from my initial visit note.  Past Medical History:  Diagnosis Date   CAD (coronary artery disease)    Colon polyp    DIABETES MELLITUS, TYPE II 11/03/2006   Dyspnea    HYPERLIPIDEMIA 11/03/2006   HYPERTENSION 11/03/2006   Myocardial infarction [facility])    PAD (peripheral artery disease) ([place])    Past Surgical History:  Procedure Laterality Date   ANGIOPLASTY  20 years ago   percutaneous transluminal    CATARACT EXTRACTION  2002, 2005   bilateral   ENDARTERECTOMY Right 12/31/2020   Procedure: RIGHT CAROTID  ENDARTERECTOMY;  Surgeon: Serafina Mitchell, MD;  Location: [facility] OR;  Service: Vascular;  Laterality: Right;   PATCH ANGIOPLASTY Right 12/31/2020   Procedure: PATCH ANGIOPLASTY OF RIGHT CAROTID ARTERY USING XENOSURE BOVINE PERICARDIUM PATCH;  Surgeon: Serafina Mitchell, MD;  Location: [facility] OR;  Service: Vascular;  Laterality: Right;   Social History   Socioeconomic History   Marital status: Married    Spouse name: Not on file   Number of children: 2   Years of education: Not on file   Highest education level: Not on file  Occupational History   Occupation: Retired-Truck driver  Tobacco Use   Smoking status: Former    Types: Cigarettes    Quit date: 05/18/1991     Years since quitting: 30.5    Passive exposure: Never   Smokeless tobacco: Never  Vaping Use   Vaping Use: Never used  Substance and Sexual Activity   Alcohol use: No   Drug use: No   Sexual activity: Not on file  Other Topics Concern   Not on file  Social History Narrative   Works 3rd shift   Regular Exercise-yes   Social Determinants of Health   Financial Resource Strain: [place]  (01/31/2020)   Overall Financial Resource Strain (CARDIA)    Difficulty of Paying Living Expenses: Not hard at all  Food Insecurity: No Food Insecurity (01/07/2020)   Hunger Vital Sign    Worried About Running Out of Food in the Last Year: Never true    [place] in the Last Year: Never true  Transportation Needs: No Transportation Needs (01/31/2020)   PRAPARE - Hydrologist (Medical): No    Lack of Transportation (Non-Medical): No  Physical Activity: Not on file  Stress: Not on file  Social Connections: Not on file  Intimate Partner Violence: Not on file   Current Outpatient Medications on File Prior to Visit  Medication Sig Dispense Refill   acetaminophen (TYLENOL) 500 MG tablet Take 500 mg by mouth every 6 (six) hours as needed for moderate pain.     amLODipine (NORVASC) 10 MG tablet TAKE ONE TABLET BY MOUTH DAILY 90 tablet 3   aspirin 81 MG tablet Take 81 mg by mouth daily.     atorvastatin (LIPITOR) 40 MG tablet Take 1 tablet (40 mg total) by mouth daily. 90 tablet 4   Blood Glucose Monitoring Suppl (ONETOUCH VERIO) w/Device KIT 1 each by Does not apply route 4 (four) times daily. 1 kit 0   Continuous Blood Gluc Receiver (FREESTYLE LIBRE 2 READER) DEVI Use as instructed 1 each 0   Continuous Blood Gluc Sensor (FREESTYLE LIBRE 2 SENSOR) MISC CHANGE EVERY 14 DAYS AS DIRECTED 6 each 3   ezetimibe (ZETIA) 10 MG tablet TAKE ONE TABLET BY MOUTH DAILY 90 tablet 1   gemfibrozil (LOPID) 600 MG tablet TAKE ONE TABLET BY MOUTH DAILY AT 2:00 IN THE AFTERNOON 90 tablet 0    HYDROcodone-acetaminophen (NORCO/VICODIN) 5-325 MG tablet Take 1 tablet by mouth every 4 (four) hours as needed for moderate pain. 15 tablet 0   insulin NPH Human (NOVOLIN N) 100 UNIT/ML injection INJECT 45 - 55 UNITS UNDER THE SKIN BEFORE BREAKFAST AND DINNER 30 mL 11   insulin regular (NOVOLIN R) 100 units/mL injection INJECT 15 - 30 UNITS into the skin 3 TIMES DAILY BEFORE MEALS AS DIRECTED 30 mL 2   Lancets Misc. (ACCU-CHEK MULTICLIX LANCET DEV) KIT Use to check sugar 4 times daily 400  each 5   lisinopril (ZESTRIL) 30 MG tablet TAKE ONE TABLET BY MOUTH DAILY 90 tablet 3   METAMUCIL FIBER PO Take 1 Dose by mouth daily. 1 dose= 1 tablespoon     metoprolol tartrate (LOPRESSOR) 50 MG tablet TAKE ONE TABLET BY MOUTH DAILY 90 tablet 3   NEEDLE, DISP, 30 G 30G X 1" MISC by Does not apply route as directed.     ONETOUCH VERIO test strip USE TO TEST BLOOD SUGAR 4 TIMES DAILY AS NEEDED 400 strip 10   Semaglutide (OZEMPIC, 0.25 OR 0.5 MG/DOSE, [place]) Inject 0.5 mg into the skin once a week.     simvastatin (ZOCOR) 40 MG tablet TAKE ONE TABLET BY MOUTH AT BEDTIME 90 tablet 0   No current facility-administered medications on file prior to visit.   No Known Allergies Family History  Problem Relation Age of Onset   COPD Father    Alcohol abuse Father    Diabetes Father    Cancer Sister        breast   Stroke Mother    Colon cancer Neg Hx     PE: BP 120/76 (BP Location: Left Arm, Patient Position: Sitting, Cuff Size: Normal)   Pulse 76   Ht 5' 7.5" (1.715 m)   Wt 228 lb (103.4 kg)   SpO2 98%   BMI 35.18 kg/m    Wt Readings from Last 3 Encounters:  12/09/21 228 lb (103.4 kg)  08/06/21 232 lb 3.2 oz (105.3 kg)  07/13/21 229 lb 12.8 oz (104.2 kg)   Constitutional: overweight, in NAD Eyes: no exophthalmos ENT: moist mucous membranes, no masses palpated in neck, no cervical lymphadenopathy Cardiovascular: RRR, No MRG Respiratory: CTA B Musculoskeletal: no deformities Skin: moist, warm,  no rashes Neurological: no tremor with outstretched hands  ASSESSMENT: 1. DM2, uncontrolled, insulin-dep, with complications - CAD - CKD - PN - DR  - Tried to obtain U500 insulin but this was too expensive >> we had to go back to N and R insulin  2. HL  3.  Obesity class 1  4.  Elevated TSH  PLAN:  1. Patient with longstanding, uncontrolled, type 2 diabetes, on basal/bolus insulin regimen with NPH and regular insulin due to cost, and now also back on Ozempic.  At last visit HbA1c was higher, at 8.8%.  Sugars are high overnight and they were improving after breakfast but they were increasing significantly after lunch and especially after dinner.  We increased his NPH before breakfast, added a dose of NPH before lunch and increase his regular insulin before dinner.  Since last visit, he was also able to restart Ozempic. CGM interpretation: -At today's visit, we reviewed his CGM downloads: It appears that 60% of values are in target range (goal >70%), while 39% are higher than 180 (goal <25%), and 1% are lower than 70 (goal <4%).  The calculated average blood sugar is 167.  The projected HbA1c for the next 3 months (GMI) is 7.3%. -Reviewing the CGM trends, sugars appear to be slightly better controlled than at last visit, fluctuating mostly in the upper half of the target range or slightly above.  His sugars are dropping in the afternoon despite not having added NPH at lunchtime, as recommended at last visit.  Upon questioning, however, the drops in blood sugars in the afternoon (to the 50s) most likely happen because he forgets to take the insulin before the meal and takes it afterwards. -In an effort to help with flexibility of dosing,  I did suggest to switch from regular insulin to NovoLog insulin, which is taken 15 minutes before a meal, rather than 30 minutes before.  He already gets Ozempic from the patient assistance program with [employer].  I recommended to switch to her regimen  consisting of Tresiba and NovoLog and eliminate NPH and regular insulin, which are more conducive to blood sugar fluctuations.  I will also have him increase the dose of Ozempic to 1 mg weekly.  Since we are making this change, I advised him to take lower doses of NovoLog than directly extrapolated from his current regular insulin.  However, I did advise him to stay on the current regimen for now until he receives the new insulins. -I advised him to: Patient Instructions  Please continue: - Ozempic 0.5 mg weekly  Insulin Before breakfast Before lunch Before dinner Bedtime  Regular 30-35 units 15-20 units 35-38 units -  NPH 35 units  -  - 45 units    If you do have a snack at night, may need to cover this with 5-10 units of regular insulin (R).  However, try to change to: - Ozempic 1 mg weekly - Tresiba 70 units at bedtime - NovoLog - 15 min before a meal 25-30 units before b'fast 15 units before lunch 25-30 units before dinner  Please come back for a follow-up appointment in 3-4 months.   - we checked his HbA1c: 8.1% (lower) - advised to check sugars at different times of the day - 4x a day, rotating check times - advised for yearly eye exams >> he is UTD - return to clinic in 3-4 months   2. HL -Reviewed latest lipid panel from 05/2021: Excellent LDL, slightly low HDL.  He is triglycerides were in the 500s before, not well controlled: Lab Results  Component Value Date   CHOL 96 06/12/2021   HDL 34.90 (L) 06/12/2021   LDLCALC 35 06/12/2021   LDLDIRECT 69.0 09/05/2020   TRIG 130.0 06/12/2021   CHOLHDL 3 06/12/2021  -He was on Zocor 40 mg daily and fenofibrate 145 mg daily without side effects.  Previously seen in the lipid clinic. Now on  Lipitor 40, gemfibrozil 600, Zetia 10.    3.  Obesity class 1 -Before the coronavirus pandemic, he was exercising at [employer], but he stopped exercising afterwards due to back pain + sciatica and also leg weakness. -He continues on  Ozempic, which should also help with weight loss -He gained 10 pounds before last visit, previously gained 9 pounds -He lost 4 pounds since last visit  4.  Elevated TSH -He had a slightly elevated TSH in 05/2020, however, most recent TSH was normal: Lab Results  Component Value Date   TSH 3.22 06/12/2021  - no signs or symptoms of hypothyroidism  Philemon Kingdom, MD PhD [facility]

## 2021-12-09 NOTE — Patient Instructions (Addendum)
Please continue: - Ozempic 0.5 mg weekly  Insulin Before breakfast Before lunch Before dinner Bedtime  Regular 30-35 units 15-20 units 35-38 units -  NPH 35 units  -  - 45 units    If you do have a snack at night, may need to cover this with 5-10 units of regular insulin (R).  However, try to change to: - Ozempic 1 mg weekly - Levemir 70 units at bedtime - NovoLog - 15 min before a meal 25-30 units before b'fast 15 units before lunch 25-30 units before dinner  Please come back for a follow-up appointment in 3-4 months.

## 2021-12-15 ENCOUNTER — Other Ambulatory Visit: Payer: Self-pay

## 2021-12-15 ENCOUNTER — Telehealth: Payer: Self-pay

## 2021-12-15 NOTE — Telephone Encounter (Signed)
Patient came in office to discuss Eric Choi and Ozempic being ordered through patient assistance program. Application was faxed on 7/26 and the patient says that he hasn't heard from [employer] yet. He says that he is currently out of insulin and would like to know if he can be prescribed Novolin N and R (says that's what he was on previously) to be sent to his pharmacy temporarily until he receives notification from [place]. Please advise

## 2021-12-15 NOTE — Telephone Encounter (Signed)
Patient provided with samples. Also provided the patient with a copy of the patient assistance application to [employer]. Application also faxed for a second time.

## 2021-12-15 NOTE — Telephone Encounter (Signed)
Given samples: Tresiba pen and NovoLog vial.  He may need to call the patient assistance program to see why there is a delay.

## 2021-12-23 ENCOUNTER — Other Ambulatory Visit (HOSPITAL_COMMUNITY): Payer: Self-pay

## 2021-12-23 ENCOUNTER — Other Ambulatory Visit: Payer: Self-pay

## 2021-12-23 MED ORDER — NOVOLOG FLEXPEN 100 UNIT/ML ~~LOC~~ SOPN
15.0000 [IU] | PEN_INJECTOR | Freq: Three times a day (TID) | SUBCUTANEOUS | 3 refills | Status: DC
  Filled 2021-12-23: qty 45, 42d supply, fill #0

## 2021-12-23 MED ORDER — SEMAGLUTIDE (1 MG/DOSE) 4 MG/3ML ~~LOC~~ SOPN
1.0000 mg | PEN_INJECTOR | SUBCUTANEOUS | 3 refills | Status: DC
  Filled 2021-12-23: qty 9, 84d supply, fill #0

## 2021-12-23 MED ORDER — LEVEMIR FLEXPEN 100 UNIT/ML ~~LOC~~ SOPN
70.0000 [IU] | PEN_INJECTOR | Freq: Every day | SUBCUTANEOUS | 3 refills | Status: DC
  Filled 2021-12-23: qty 45, 64d supply, fill #0

## 2021-12-24 ENCOUNTER — Other Ambulatory Visit (HOSPITAL_COMMUNITY): Payer: Self-pay

## 2021-12-24 MED ORDER — INSULIN PEN NEEDLE 31G X 5 MM MISC
0 refills | Status: DC
  Filled 2021-12-24: qty 100, 30d supply, fill #0

## 2021-12-25 ENCOUNTER — Telehealth: Payer: Self-pay

## 2021-12-25 ENCOUNTER — Other Ambulatory Visit (HOSPITAL_COMMUNITY): Payer: Self-pay

## 2021-12-25 NOTE — Telephone Encounter (Addendum)
Patient Advocate Encounter   Received notification from CoverMyMeds that prior authorization is required for Ozempic '1MG'$ . PA submitted and APPROVED on 12/25/21.  Key [id number] Effective: 12/25/2021 - 05/16/2022  Clista Bernhardt, CPhT Rx Patient Advocate Specialist Phone: [phone]

## 2021-12-30 ENCOUNTER — Other Ambulatory Visit: Payer: Self-pay | Admitting: Adult Health

## 2021-12-30 NOTE — Telephone Encounter (Signed)
Please advise if ok to fill. Red flag for multiple statin therapy

## 2022-01-12 ENCOUNTER — Other Ambulatory Visit: Payer: Self-pay | Admitting: Internal Medicine

## 2022-01-12 ENCOUNTER — Other Ambulatory Visit (HOSPITAL_COMMUNITY): Payer: Self-pay

## 2022-01-12 MED ORDER — TECHLITE PEN NEEDLES 31G X 5 MM MISC
0 refills | Status: DC
  Filled 2022-01-12 – 2022-01-14 (×2): qty 100, 25d supply, fill #0

## 2022-01-14 ENCOUNTER — Telehealth: Payer: Self-pay

## 2022-01-14 ENCOUNTER — Other Ambulatory Visit (HOSPITAL_COMMUNITY): Payer: Self-pay

## 2022-01-14 NOTE — Telephone Encounter (Signed)
Patient's wife, Ermias Tomeo, picked up 8 boxes of Novolog and 5 boxes of Tresiba.

## 2022-01-14 NOTE — Telephone Encounter (Signed)
Pt's spouse Izora Gala advised patient assistance Novolog (8 boxes) and [place] (5 boxes) have been delivered and ready for pick up.

## 2022-01-16 ENCOUNTER — Other Ambulatory Visit (HOSPITAL_COMMUNITY): Payer: Self-pay

## 2022-01-19 DIAGNOSIS — N184 Chronic kidney disease, stage 4 (severe): Secondary | ICD-10-CM | POA: Diagnosis not present

## 2022-01-27 DIAGNOSIS — N184 Chronic kidney disease, stage 4 (severe): Secondary | ICD-10-CM | POA: Diagnosis not present

## 2022-01-27 DIAGNOSIS — E1122 Type 2 diabetes mellitus with diabetic chronic kidney disease: Secondary | ICD-10-CM | POA: Diagnosis not present

## 2022-01-27 DIAGNOSIS — I129 Hypertensive chronic kidney disease with stage 1 through stage 4 chronic kidney disease, or unspecified chronic kidney disease: Secondary | ICD-10-CM | POA: Diagnosis not present

## 2022-01-28 ENCOUNTER — Ambulatory Visit (INDEPENDENT_AMBULATORY_CARE_PROVIDER_SITE_OTHER): Payer: HMO | Admitting: Ophthalmology

## 2022-01-28 ENCOUNTER — Encounter (INDEPENDENT_AMBULATORY_CARE_PROVIDER_SITE_OTHER): Payer: Self-pay | Admitting: Ophthalmology

## 2022-01-28 ENCOUNTER — Other Ambulatory Visit (HOSPITAL_COMMUNITY): Payer: Self-pay

## 2022-01-28 DIAGNOSIS — Z961 Presence of intraocular lens: Secondary | ICD-10-CM

## 2022-01-28 DIAGNOSIS — E113393 Type 2 diabetes mellitus with moderate nonproliferative diabetic retinopathy without macular edema, bilateral: Secondary | ICD-10-CM

## 2022-01-28 DIAGNOSIS — H43822 Vitreomacular adhesion, left eye: Secondary | ICD-10-CM | POA: Diagnosis not present

## 2022-01-28 NOTE — Assessment & Plan Note (Signed)
Mild to moderate NPDR stable over time.  We will continue to monitor and observe

## 2022-01-28 NOTE — Assessment & Plan Note (Signed)
Stable OU 

## 2022-01-28 NOTE — Assessment & Plan Note (Signed)
Physiologic OS 

## 2022-01-28 NOTE — Progress Notes (Signed)
01/28/2022     CHIEF COMPLAINT Patient presents for  Chief Complaint  Patient presents with   Diabetic Retinopathy without Macular Edema      HISTORY OF PRESENT ILLNESS: Eric Choi is a 81 y.o. male who presents to the clinic today for:   HPI   1 YR FU OU OCT FP. Pt stated, "When I'm wearing my new glasses, it feels like its being blocked. Ive only worn these glasses about 2 times. Dr. Katy Fitch did a laser procedure in both eyes, I don't remember what it was called but my vision has been good since then. The only problem are my glasses."   Last edited by Silvestre Moment on 01/28/2022  9:17 AM.      Referring physician: Warden Fillers, MD [address] [place],  [place] [address]  HISTORICAL INFORMATION:   Selected notes from the MEDICAL RECORD NUMBER    Lab Results  Component Value Date   HGBA1C 8.1 (A) 12/09/2021     CURRENT MEDICATIONS: No current outpatient medications on file. (Ophthalmic Drugs)   No current facility-administered medications for this visit. (Ophthalmic Drugs)   Current Outpatient Medications (Other)  Medication Sig   acetaminophen (TYLENOL) 500 MG tablet Take 500 mg by mouth every 6 (six) hours as needed for moderate pain.   amLODipine (NORVASC) 10 MG tablet TAKE ONE TABLET BY MOUTH DAILY   aspirin 81 MG tablet Take 81 mg by mouth daily.   atorvastatin (LIPITOR) 40 MG tablet Take 1 tablet (40 mg total) by mouth daily.   Blood Glucose Monitoring Suppl (ONETOUCH VERIO) w/Device KIT 1 each by Does not apply route 4 (four) times daily.   Continuous Blood Gluc Receiver (FREESTYLE LIBRE 2 READER) DEVI Use as instructed   Continuous Blood Gluc Sensor (FREESTYLE LIBRE 2 SENSOR) MISC CHANGE EVERY 14 DAYS AS DIRECTED   ezetimibe (ZETIA) 10 MG tablet TAKE ONE TABLET BY MOUTH DAILY   gemfibrozil (LOPID) 600 MG tablet TAKE 1 TABLET BY MOUTH DAILY AT 2:00 IN THE AFTERNOON   insulin aspart (NOVOLOG FLEXPEN) 100 UNIT/ML FlexPen Inject 15-35 Units into the  skin 3 (three) times daily before meals.   insulin detemir (LEVEMIR FLEXPEN) 100 UNIT/ML FlexPen Inject 70 Units into the skin at bedtime.   Insulin Pen Needle (TECHLITE PEN NEEDLES) 31G X 5 MM MISC Use as directed with insulin.   Lancets Misc. (ACCU-CHEK MULTICLIX LANCET DEV) KIT Use to check sugar 4 times daily   lisinopril (ZESTRIL) 30 MG tablet TAKE ONE TABLET BY MOUTH DAILY   METAMUCIL FIBER PO Take 1 Dose by mouth daily. 1 dose= 1 tablespoon   metoprolol tartrate (LOPRESSOR) 50 MG tablet TAKE ONE TABLET BY MOUTH DAILY   NEEDLE, DISP, 30 G 30G X 1" MISC by Does not apply route as directed.   ONETOUCH VERIO test strip USE TO TEST BLOOD SUGAR 4 TIMES DAILY AS NEEDED   Semaglutide, 1 MG/DOSE, 4 MG/3ML SOPN Inject 1 mg as directed once a week.   No current facility-administered medications for this visit. (Other)      REVIEW OF SYSTEMS: ROS   Negative for: Constitutional, Gastrointestinal, Neurological, Skin, Genitourinary, Musculoskeletal, HENT, Endocrine, Cardiovascular, Eyes, Respiratory, Psychiatric, Allergic/Imm, Heme/Lymph Last edited by Silvestre Moment on 01/28/2022  9:17 AM.       ALLERGIES No Known Allergies  PAST MEDICAL HISTORY Past Medical History:  Diagnosis Date   CAD (coronary artery disease)    Colon polyp    DIABETES MELLITUS, TYPE II  11/03/2006   Dyspnea    HYPERLIPIDEMIA 11/03/2006   HYPERTENSION 11/03/2006   Myocardial infarction [facility])    PAD (peripheral artery disease) (HCC)    Past Surgical History:  Procedure Laterality Date   ANGIOPLASTY  20 years ago   percutaneous transluminal    CATARACT EXTRACTION  2002, 2005   bilateral   ENDARTERECTOMY Right 12/31/2020   Procedure: RIGHT CAROTID ENDARTERECTOMY;  Surgeon: Serafina Mitchell, MD;  Location: [facility] OR;  Service: Vascular;  Laterality: Right;   PATCH ANGIOPLASTY Right 12/31/2020   Procedure: PATCH ANGIOPLASTY OF RIGHT CAROTID ARTERY USING XENOSURE BOVINE PERICARDIUM PATCH;  Surgeon: Serafina Mitchell, MD;   Location: [facility] OR;  Service: Vascular;  Laterality: Right;    FAMILY HISTORY Family History  Problem Relation Age of Onset   COPD Father    Alcohol abuse Father    Diabetes Father    Cancer Sister        breast   Stroke Mother    Colon cancer Neg Hx     SOCIAL HISTORY Social History   Tobacco Use   Smoking status: Former    Types: Cigarettes    Quit date: 05/18/1991    Years since quitting: 30.7    Passive exposure: Never   Smokeless tobacco: Never  Vaping Use   Vaping Use: Never used  Substance Use Topics   Alcohol use: No   Drug use: No         OPHTHALMIC EXAM:  Base Eye Exam     Visual Acuity (ETDRS)       Right Left   Dist cc 20/40 -2 20/40   Dist ph cc 20/30 -2 20/30 -1 +1    Correction: Glasses         Tonometry (Tonopen, 9:23 AM)       Right Left   Pressure 14 12         Pupils       Pupils Dark Light Shape React APD   Right PERRL 3 3 Round Brisk None   Left PERRL 2 2 Round Brisk None         Visual Fields       Left Right    Full Full         Extraocular Movement       Right Left    Full, Ortho Full, Ortho         Neuro/Psych     Oriented x3: Yes   Mood/Affect: Normal         Dilation     Both eyes: 1.0% Mydriacyl, 2.5% Phenylephrine @ 9:23 AM           Slit Lamp and Fundus Exam     External Exam       Right Left   External Normal Normal         Slit Lamp Exam       Right Left   Lids/Lashes Normal Normal   Conjunctiva/Sclera White and quiet White and quiet   Cornea Clear Clear   Anterior Chamber Deep and quiet Deep and quiet   Iris Round and reactive Round and reactive   Lens Centered posterior chamber intraocular lens Centered posterior chamber intraocular lens   Anterior Vitreous Normal Normal         Fundus Exam       Right Left   Posterior Vitreous Posterior vitreous detachment Posterior vitreous detachment   Disc Normal Normal   C/D Ratio 0.55 0.55   Macula  Microaneurysms  Microaneurysms   Vessels NPDR- Mild NPDR- Mild   Periphery no neovascularization, normal no neovascularization, normal            IMAGING AND PROCEDURES  Imaging and Procedures for 01/28/22  OCT, Retina - OU - Both Eyes       Right Eye Quality was good. Scan locations included subfoveal. Central Foveal Thickness: 215. Findings include normal foveal contour.   Left Eye Quality was good. Scan locations included subfoveal. Central Foveal Thickness: 242. Findings include normal foveal contour, vitreomacular adhesion .   Notes Nondistorting vitreomacular adhesion left eye.     Color Fundus Photography Optos - OU - Both Eyes       Right Eye Progression has no prior data. Disc findings include normal observations. Macula : normal observations. Vessels : normal observations. Periphery : normal observations.   Left Eye Progression has no prior data. Disc findings include normal observations. Macula : normal observations. Vessels : normal observations. Periphery : normal observations.              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 Vitreomacular adhesion of left eye  H43.822     3. Pseudophakia, both eyes  Z96.1       1.  Mild to moderate NPDR OU stable over time.  2.  Continue blood sugar control monitoring and critical for prevention of rapid progression of DR and other complications  3.  Ophthalmic Meds Ordered this  visit:  No orders of the defined types were placed in this encounter.      Return in about 1 year (around 01/29/2023) for DILATE OU.  There are no Patient Instructions on file for this visit.   Explained the diagnoses, plan, and follow up with the patient and they expressed understanding.  Patient expressed understanding of the importance of proper follow up care.   Clent Demark Azlynn Mitnick M.D. Diseases & Surgery of the Retina and Vitreous [facility] 01/28/22     Abbreviations: M myopia (nearsighted); A astigmatism; H hyperopia (farsighted); P presbyopia; Mrx spectacle prescription;  CTL contact lenses; OD right eye; OS left eye; OU both eyes  XT exotropia; ET esotropia; PEK punctate epithelial keratitis; PEE punctate epithelial erosions; DES dry eye syndrome; MGD meibomian gland dysfunction; ATs artificial tears; PFAT's preservative free artificial tears; [place] nuclear sclerotic cataract; PSC posterior subcapsular cataract; ERM epi-retinal membrane; PVD posterior vitreous detachment; RD retinal detachment; DM diabetes mellitus; DR diabetic retinopathy; NPDR non-proliferative diabetic retinopathy; PDR proliferative diabetic retinopathy; CSME clinically significant macular edema; DME diabetic macular edema; dbh dot blot hemorrhages; CWS cotton wool spot; POAG primary open angle glaucoma; C/D cup-to-disc ratio; HVF humphrey visual field; GVF goldmann visual field; OCT optical coherence tomography; IOP intraocular pressure; BRVO Branch retinal vein occlusion; CRVO central retinal vein occlusion; CRAO central retinal artery occlusion; BRAO branch retinal artery occlusion; RT retinal tear; SB scleral buckle; PPV pars plana vitrectomy; VH Vitreous hemorrhage; PRP  panretinal laser photocoagulation; IVK intravitreal kenalog; VMT vitreomacular traction; MH Macular hole;  NVD neovascularization of the disc; NVE neovascularization elsewhere; AREDS age related eye disease study; ARMD age related  macular degeneration; POAG primary open angle glaucoma; EBMD epithelial/anterior basement membrane dystrophy; ACIOL anterior chamber intraocular lens; IOL intraocular lens; PCIOL posterior chamber intraocular lens; Phaco/IOL phacoemulsification with intraocular lens placement; [place] photorefractive keratectomy; LASIK laser assisted in situ keratomileusis; HTN hypertension; DM diabetes mellitus; COPD chronic obstructive pulmonary disease

## 2022-02-10 DIAGNOSIS — E875 Hyperkalemia: Secondary | ICD-10-CM | POA: Diagnosis not present

## 2022-02-12 ENCOUNTER — Telehealth: Payer: Self-pay

## 2022-02-12 NOTE — Telephone Encounter (Signed)
Patients wife has now picked up 4 boxes Ozempic.

## 2022-02-12 NOTE — Telephone Encounter (Signed)
Patient's wife informed that 4 boxes of Ozempic has been received and is ready for pickup. Says that she will come this afternoon to pick them up.

## 2022-02-18 NOTE — Progress Notes (Addendum)
Chief Complaint  Patient presents with   Follow-up    CAD   History of Present Illness: 81 yo male with history of CAD, DM, CKD, HTN, former tobacco abuse, PAD, carotid artery disease and hyperlipidemia who is here today for cardiac follow up. I saw him as a new consult September 2021 for the evaluation of dyspnea on exertion. He is followed in the [facility] by Dr. Trula Slade for PAD with no prior lower extremity procedures. 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 He had followed in the past with Dr. Olevia Perches. At his first visit with me in September 2021 he described dyspnea on exertion for several years with no associated chest pain, LE edema, dizziness, near syncope or palpitations. Former smoker but stopped in 1993. Echo 02/06/20 with LVEF=50%, mild LVH, grade 1 diastolic dysfunction. No significant valve disease. Nuclear stress test 02/06/20 with no evidence of ischemia. I saw him in May 2022 and he continued to have complaints of dyspnea on exertion with no change over several years. He underwent right carotid endarterectomy August 2022.   He is here today for follow up. The patient denies any chest pain, dyspnea, palpitations, lower extremity edema, orthopnea, PND, dizziness, near syncope or syncope.   Primary Care Physician: [name], NP  Past Medical History:  Diagnosis Date   CAD (coronary artery disease)    Colon polyp    DIABETES MELLITUS, TYPE II 11/03/2006   Dyspnea    HYPERLIPIDEMIA 11/03/2006   HYPERTENSION 11/03/2006   Myocardial infarction [facility])    PAD (peripheral artery disease) ([place])     Past Surgical History:  Procedure Laterality Date   ANGIOPLASTY  20 years ago   percutaneous transluminal    CATARACT EXTRACTION  2002, 2005   bilateral   ENDARTERECTOMY Right 12/31/2020   Procedure: RIGHT CAROTID ENDARTERECTOMY;  Surgeon: Serafina Mitchell, MD;  Location: [facility];  Service: Vascular;  Laterality: Right;    PATCH ANGIOPLASTY Right 12/31/2020   Procedure: PATCH ANGIOPLASTY OF RIGHT CAROTID ARTERY USING [place];  Surgeon: Serafina Mitchell, MD;  [facility] OR;  Service: Vascular;  Laterality: Right;    Current Outpatient Medications  Medication Sig Dispense Refill   acetaminophen (TYLENOL) 500 MG tablet Take 500 mg by mouth every 6 (six) hours as needed for moderate pain.     amLODipine (NORVASC) 10 MG tablet TAKE ONE TABLET BY MOUTH DAILY 90 tablet 3   aspirin 81 MG tablet Take 81 mg by mouth daily.     atorvastatin (LIPITOR) 40 MG tablet Take 1 tablet (40 mg total) by mouth daily. 90 tablet 4   Blood Glucose Monitoring Suppl (ONETOUCH VERIO) w/Device KIT 1 each by Does not apply route 4 (four) times daily. 1 kit 0   Continuous Blood Gluc Receiver (FREESTYLE LIBRE 2 READER) DEVI Use as instructed 1 each 0   Continuous Blood Gluc Sensor (FREESTYLE LIBRE 2 SENSOR) MISC CHANGE EVERY 14 DAYS AS DIRECTED 6 each 3   ezetimibe (ZETIA) 10 MG tablet TAKE ONE TABLET BY MOUTH DAILY 90 tablet 1   gemfibrozil (LOPID) 600 MG tablet TAKE 1 TABLET BY MOUTH DAILY AT 2:00 IN THE AFTERNOON 90 tablet 1   insulin aspart (NOVOLOG FLEXPEN) 100 UNIT/ML FlexPen Inject 15-35 Units into the skin 3 (three) times daily before meals. 45 mL 3   insulin detemir (LEVEMIR FLEXPEN) 100 UNIT/ML FlexPen Inject 70 Units into the skin at bedtime.  45 mL 3   Insulin Pen Needle (TECHLITE PEN NEEDLES) 31G X 5 MM MISC Use as directed with insulin. 100 each 0   Lancets Misc. (ACCU-CHEK MULTICLIX LANCET DEV) KIT Use to check sugar 4 times daily 400 each 5   lisinopril (ZESTRIL) 30 MG tablet TAKE ONE TABLET BY MOUTH DAILY 90 tablet 3   METAMUCIL FIBER PO Take 1 Dose by mouth daily. 1 dose= 1 tablespoon     metoprolol tartrate (LOPRESSOR) 50 MG tablet TAKE ONE TABLET BY MOUTH DAILY 90 tablet 3   NEEDLE, DISP, 30 G 30G X 1" MISC by Does not apply route as directed.     ONETOUCH VERIO test strip USE TO TEST BLOOD SUGAR 4  TIMES DAILY AS NEEDED 400 strip 10   Semaglutide, 1 MG/DOSE, 4 MG/3ML SOPN Inject 1 mg as directed once a week. 9 mL 3   No current facility-administered medications for this visit.    No Known Allergies  Social History   Socioeconomic History   Marital status: Married    Spouse name: Not on file   Number of children: 2   Years of education: Not on file   Highest education level: Not on file  Occupational History   Occupation: Retired-Truck driver  Tobacco Use   Smoking status: Former    Types: Cigarettes    Quit date: 05/18/1991    Years since quitting: 30.7    Passive exposure: Never   Smokeless tobacco: Never  Vaping Use   Vaping Use: Never used  Substance and Sexual Activity   Alcohol use: No   Drug use: No   Sexual activity: Not on file  Other Topics Concern   Not on file  Social History Narrative   Works 3rd shift   Regular Exercise-yes   Social Determinants of Health   Financial Resource Strain: [place]  (01/31/2020)   Overall Financial Resource Strain (CARDIA)    Difficulty of Paying Living Expenses: Not hard at all  Food Insecurity: No Food Insecurity (01/07/2020)   Hunger Vital Sign    Worried About Running Out of Food in the Last Year: Never true    [place] in the Last Year: Never true  Transportation Needs: No Transportation Needs (01/31/2020)   PRAPARE - Hydrologist (Medical): No    Lack of Transportation (Non-Medical): No  Physical Activity: Not on file  Stress: Not on file  Social Connections: Not on file  Intimate Partner Violence: Not on file    Family History  Problem Relation Age of Onset   COPD Father    Alcohol abuse Father    Diabetes Father    Cancer Sister        breast   Stroke Mother    Colon cancer Neg Hx     Review of Systems:  As stated in the HPI and otherwise negative.   BP 136/72   Pulse 81   Ht 5' 7.5" (1.715 m)   Wt 229 lb 12.8 oz (104.2 kg)   SpO2 98%   BMI 35.46 kg/m          Physical Examination: General: Well developed, well nourished, NAD  HEENT: OP clear, mucus membranes moist  SKIN: warm, dry. No rashes. Neuro: No focal deficits  Musculoskeletal: Muscle strength 5/5 all ext  Psychiatric: Mood and affect normal  Neck: No JVD, no carotid bruits, no thyromegaly, no lymphadenopathy.  Lungs:Clear bilaterally, no wheezes, rhonci, crackles Cardiovascular: Regular rate and  rhythm. No murmurs, gallops or rubs. Abdomen:Soft. Bowel sounds present. Non-tender.  Extremities: No lower extremity edema. Pulses are 2 + in the bilateral DP/PT.  EKG:  EKG is ordered today. The ekg ordered today demonstrates NSR  Echo 02/06/20:  1. Left ventricular ejection fraction, by estimation, is 50%. The left  ventricle has mildly decreased function. The left ventricle demonstrates  regional wall motion abnormalities with mid-apical inferoseptal and  anteroseptal mild hypokinesis. There is  mild left ventricular hypertrophy. Left ventricular diastolic parameters  are consistent with Grade I diastolic dysfunction (impaired relaxation).   2. Right ventricular systolic function is normal. The right ventricular  size is normal. Tricuspid regurgitation signal is inadequate for assessing  PA pressure.   3. The mitral valve is normal in structure. No evidence of mitral valve  regurgitation. No evidence of mitral stenosis.   4. The aortic valve is tricuspid. Aortic valve regurgitation is not  visualized. Mild aortic valve sclerosis is present, with no evidence of  aortic valve stenosis.   5. IVC not visualized.   Recent Labs: 06/12/2021: ALT 15; BUN 36; Creatinine, Ser 2.38; Hemoglobin 13.7; Platelets 310.0; Potassium 4.8; Sodium 139; TSH 3.22   Lipid Panel    Component Value Date/Time   CHOL 96 06/12/2021 0801   TRIG 130.0 06/12/2021 0801   HDL 34.90 (L) 06/12/2021 0801   CHOLHDL 3 06/12/2021 0801   VLDL 26.0 06/12/2021 0801   LDLCALC 35 06/12/2021 0801   LDLDIRECT 69.0  09/05/2020 0800     Wt Readings from Last 3 Encounters:  02/19/22 229 lb 12.8 oz (104.2 kg)  12/09/21 228 lb (103.4 kg)  08/06/21 232 lb 3.2 oz (105.3 kg)    Assessment and Plan:   1. CAD without angina: He is known to have CAD by cath in 1994. No cath since then. Nuclear stress test in September 2021 with no ischemia. Echo September 2021 with LVEF=50%. No chest pain suggestive of angina. He has baseline dyspnea on exertion with no recent changes. Continue ASA, beta blocker and statin.    2. PAD: Followed in [facility]. 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 Right CEA August 2022.   3. HTN: BP is elevated today. Repeat check 136/72. Continue current therapy  4. Hyperlipidemia: LDL at goal January 2023. Continue statin and Zetia.    Labs/ tests ordered today include:   Orders Placed This Encounter  Procedures   EKG 12-Lead    Disposition:   F/U with me in 12 months  Signed, Lauree Chandler, MD 02/19/2022 9:55 AM    [facility] [address], [place], [place]  [address] Phone: [phone]; Fax: [phone]

## 2022-02-19 ENCOUNTER — Ambulatory Visit: Payer: HMO | Attending: Cardiovascular Disease | Admitting: Cardiovascular Disease

## 2022-02-19 ENCOUNTER — Encounter: Payer: Self-pay | Admitting: Cardiovascular Disease

## 2022-02-19 VITALS — BP 136/72 | HR 81 | Ht 67.5 in | Wt 229.8 lb

## 2022-02-19 DIAGNOSIS — I2510 Atherosclerotic heart disease of native coronary artery without angina pectoris: Secondary | ICD-10-CM

## 2022-02-19 DIAGNOSIS — I10 Essential (primary) hypertension: Secondary | ICD-10-CM | POA: Diagnosis not present

## 2022-02-19 DIAGNOSIS — E7800 Pure hypercholesterolemia, unspecified: Secondary | ICD-10-CM | POA: Diagnosis not present

## 2022-02-19 NOTE — Patient Instructions (Signed)
Medication Instructions:  No changes *If you need a refill on your cardiac medications before your next appointment, please call your pharmacy*   Lab Work: none If you have labs (blood work) drawn today and your tests are completely normal, you will receive your results only by: [place] (if you have MyChart) OR A paper copy in the mail If you have any lab test that is abnormal or we need to change your treatment, we will call you to review the results.   Testing/Procedures: non3   Follow-Up: At [facility], you and your health needs are our priority.  As part of our continuing mission to provide you with exceptional heart care, we have created designated Provider Care Teams.  These Care Teams include your primary Cardiologist (physician) and Advanced Practice Providers (APPs -  Physician Assistants and Nurse Practitioners) who all work together to provide you with the care you need, when you need it.  We recommend signing up for the patient portal called "MyChart".  Sign up information is provided on this After Visit Summary.  MyChart is used to connect with patients for Virtual Visits (Telemedicine).  Patients are able to view lab/test results, encounter notes, upcoming appointments, etc.  Non-urgent messages can be sent to your provider as well.   To learn more about what you can do with MyChart, go to [email or web address].    Your next appointment:   12 month(s)  The format for your next appointment:   In Person  Provider:   Lauree Chandler, MD      Important Information About Sugar

## 2022-02-25 ENCOUNTER — Other Ambulatory Visit: Payer: Self-pay | Admitting: Internal Medicine

## 2022-02-25 ENCOUNTER — Other Ambulatory Visit (HOSPITAL_COMMUNITY): Payer: Self-pay

## 2022-02-25 MED ORDER — TECHLITE PEN NEEDLES 31G X 5 MM MISC
0 refills | Status: DC
Start: 2022-02-25 — End: 2022-07-26
  Filled 2022-02-25: qty 100, 25d supply, fill #0

## 2022-03-15 ENCOUNTER — Ambulatory Visit (INDEPENDENT_AMBULATORY_CARE_PROVIDER_SITE_OTHER): Payer: HMO | Admitting: Internal Medicine

## 2022-03-15 ENCOUNTER — Encounter: Payer: Self-pay | Admitting: Internal Medicine

## 2022-03-15 VITALS — BP 142/76 | HR 82 | Ht 67.5 in | Wt 229.6 lb

## 2022-03-15 DIAGNOSIS — E1159 Type 2 diabetes mellitus with other circulatory complications: Secondary | ICD-10-CM | POA: Diagnosis not present

## 2022-03-15 DIAGNOSIS — E669 Obesity, unspecified: Secondary | ICD-10-CM | POA: Diagnosis not present

## 2022-03-15 DIAGNOSIS — R7989 Other specified abnormal findings of blood chemistry: Secondary | ICD-10-CM | POA: Diagnosis not present

## 2022-03-15 DIAGNOSIS — E1165 Type 2 diabetes mellitus with hyperglycemia: Secondary | ICD-10-CM | POA: Diagnosis not present

## 2022-03-15 DIAGNOSIS — E785 Hyperlipidemia, unspecified: Secondary | ICD-10-CM | POA: Diagnosis not present

## 2022-03-15 DIAGNOSIS — Z6834 Body mass index (BMI) 34.0-34.9, adult: Secondary | ICD-10-CM

## 2022-03-15 LAB — POCT GLYCOSYLATED HEMOGLOBIN (HGB A1C): Hemoglobin A1C: 8.6 % — AB (ref 4.0–5.6)

## 2022-03-15 NOTE — Progress Notes (Signed)
Patient ID: Eric Choi, male   DOB: 1940/08/01, 81 y.o.   MRN: [id number]  HPI: Eric Choi is a 81 y.o.-year-old male, returning for f/u for DM2 dx 1990, insulin-dependent since 2005, uncontrolled, with complications (CAD, CKD, PN, DR, PVD). Last visit was 3 months ago.  Interim history: No increased urination, nausea, chest pain.  He does have blurry vision as he has a hard time adjusting to transitional lenses.  Reviewed HbA1c levels: Lab Results  Component Value Date   HGBA1C 8.1 (A) 12/09/2021   HGBA1C 8.8 (A) 08/06/2021   HGBA1C 8.3 (A) 04/28/2021   At last visit, he was on: Insulin Before breakfast Before lunch Before dinner Bedtime  Regular 30-35 units 15-20 units 35-38 units -  NPH 35 units -  - 45 units  - Ozempic 0.5 mg weekly (PAP)  We changed to: - Ozempic 1 mg weekly - Tresiba >> Levemir 70 units at bedtime - NovoLog - 15 min before a meal 25-30 units before b'fast 15 units before lunch 25-30 units before dinner >> 35 units before each meal  We tried U500 insulin but this was too expensive. He did not start Trulicity due to price - 145$.  Pt checks his sugars  more than 4 times a day with his freestyle libre 2 CGM:   Previously:   Previously:   Lowest CBG: 42 x1 >> 80 >> 50s >> 65; he has hypoglycemia awareness in the 90s. Highest CBG: 444 >> .Marland KitchenMarland Kitchen300s >> >254 >> 330.  Pt's meals are: - Breakfast: cereal + milk + eggs + bacon/sausage - Lunch: egg sandwichTy!  - Dinner: meat + veggies + starch - Snacks: 1-2: including after dinner; pears + mayonnaise, milk + crackers; cake  -+ CKD, last BUN/creatinine:  10/16/2021: 30/1.97, GFR 34, glucose 203 Lab Results  Component Value Date   BUN 36 (H) 06/12/2021   CREATININE 2.38 (H) 06/12/2021  On lisinopril.  -+ HL; last set of lipids: Lab Results  Component Value Date   CHOL 96 06/12/2021   HDL 34.90 (L) 06/12/2021   LDLCALC 35 06/12/2021   LDLDIRECT 69.0 09/05/2020   TRIG 130.0 06/12/2021    CHOLHDL 3 06/12/2021  On Lipitor 40, gemfibrozil 600, Zetia 10.    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 Dr. Radene Ou.    - + numbness and tingling in his feet.  Foot exam up-to-date from 05/2021.  He also has HTN. He had right carotid endarterectomy 12/31/2020. He has a history of claudication, which improved.  TSH is slightly elevated in the past but it normalized: Lab Results  Component Value Date   TSH 3.22 06/12/2021   TSH 3.67 08/26/2020   TSH 4.52 (H) 06/11/2020   TSH 4.18 06/05/2019   TSH 3.61 09/08/2018   TSH 4.64 (H) 03/09/2018   TSH 2.90 03/11/2017   TSH 2.69 03/05/2016   TSH 1.62 03/12/2015   TSH 4.04 02/01/2014   TSH 2.38 10/06/2010   TSH 2.80 12/27/2008   He lost his twin brother to cancer 05/2018.  ROS: see HPI  Neurological: no tremors/+ numbness/+ tingling/no dizziness  I reviewed pt's medications, allergies, PMH, social hx, family hx, and changes were documented in the history of present illness. Otherwise, unchanged from my initial visit note.  Past Medical History:  Diagnosis Date   CAD (coronary artery disease)    Colon polyp    DIABETES MELLITUS, TYPE II 11/03/2006   Dyspnea    HYPERLIPIDEMIA 11/03/2006  HYPERTENSION 11/03/2006   Myocardial infarction [facility])    PAD (peripheral artery disease) (HCC)    Past Surgical History:  Procedure Laterality Date   ANGIOPLASTY  20 years ago   percutaneous transluminal    CATARACT EXTRACTION  2002, 2005   bilateral   ENDARTERECTOMY Right 12/31/2020   Procedure: RIGHT CAROTID ENDARTERECTOMY;  Surgeon: Serafina Mitchell, MD;  Location: [facility] OR;  Service: Vascular;  Laterality: Right;   PATCH ANGIOPLASTY Right 12/31/2020   Procedure: PATCH ANGIOPLASTY OF RIGHT CAROTID ARTERY USING XENOSURE BOVINE PERICARDIUM PATCH;  Surgeon: Serafina Mitchell, MD;  Location: [facility] OR;  Service: Vascular;  Laterality: Right;   Social History   Socioeconomic History   Marital status: Married     Spouse name: Not on file   Number of children: 2   Years of education: Not on file   Highest education level: Not on file  Occupational History   Occupation: Retired-Truck driver  Tobacco Use   Smoking status: Former    Types: Cigarettes    Quit date: 05/18/1991    Years since quitting: 30.8    Passive exposure: Never   Smokeless tobacco: Never  Vaping Use   Vaping Use: Never used  Substance and Sexual Activity   Alcohol use: No   Drug use: No   Sexual activity: Not on file  Other Topics Concern   Not on file  Social History Narrative   Works 3rd shift   Regular Exercise-yes   Social Determinants of Health   Financial Resource Strain: [place]  (01/31/2020)   Overall Financial Resource Strain (CARDIA)    Difficulty of Paying Living Expenses: Not hard at all  Food Insecurity: No Food Insecurity (01/07/2020)   Hunger Vital Sign    Worried About Running Out of Food in the Last Year: Never true    [place] in the Last Year: Never true  Transportation Needs: No Transportation Needs (01/31/2020)   PRAPARE - Hydrologist (Medical): No    Lack of Transportation (Non-Medical): No  Physical Activity: Not on file  Stress: Not on file  Social Connections: Not on file  Intimate Partner Violence: Not on file   Current Outpatient Medications on File Prior to Visit  Medication Sig Dispense Refill   acetaminophen (TYLENOL) 500 MG tablet Take 500 mg by mouth every 6 (six) hours as needed for moderate pain.     amLODipine (NORVASC) 10 MG tablet TAKE ONE TABLET BY MOUTH DAILY 90 tablet 3   aspirin 81 MG tablet Take 81 mg by mouth daily.     atorvastatin (LIPITOR) 40 MG tablet Take 1 tablet (40 mg total) by mouth daily. 90 tablet 4   Blood Glucose Monitoring Suppl (ONETOUCH VERIO) w/Device KIT 1 each by Does not apply route 4 (four) times daily. 1 kit 0   Continuous Blood Gluc Receiver (FREESTYLE LIBRE 2 READER) DEVI Use as instructed 1 each 0   Continuous  Blood Gluc Sensor (FREESTYLE LIBRE 2 SENSOR) MISC CHANGE EVERY 14 DAYS AS DIRECTED 6 each 3   ezetimibe (ZETIA) 10 MG tablet TAKE ONE TABLET BY MOUTH DAILY 90 tablet 1   gemfibrozil (LOPID) 600 MG tablet TAKE 1 TABLET BY MOUTH DAILY AT 2:00 IN THE AFTERNOON 90 tablet 1   insulin aspart (NOVOLOG FLEXPEN) 100 UNIT/ML FlexPen Inject 15-35 Units into the skin 3 (three) times daily before meals. 45 mL 3   insulin detemir (LEVEMIR FLEXPEN) 100 UNIT/ML FlexPen Inject 70 Units  into the skin at bedtime. 45 mL 3   Insulin Pen Needle (TECHLITE PEN NEEDLES) 31G X 5 MM MISC Use as directed with insulin. 100 each 0   Lancets Misc. (ACCU-CHEK MULTICLIX LANCET DEV) KIT Use to check sugar 4 times daily 400 each 5   lisinopril (ZESTRIL) 30 MG tablet TAKE ONE TABLET BY MOUTH DAILY 90 tablet 3   METAMUCIL FIBER PO Take 1 Dose by mouth daily. 1 dose= 1 tablespoon     metoprolol tartrate (LOPRESSOR) 50 MG tablet TAKE ONE TABLET BY MOUTH DAILY 90 tablet 3   NEEDLE, DISP, 30 G 30G X 1" MISC by Does not apply route as directed.     ONETOUCH VERIO test strip USE TO TEST BLOOD SUGAR 4 TIMES DAILY AS NEEDED 400 strip 10   Semaglutide, 1 MG/DOSE, 4 MG/3ML SOPN Inject 1 mg as directed once a week. 9 mL 3   No current facility-administered medications on file prior to visit.   No Known Allergies Family History  Problem Relation Age of Onset   COPD Father    Alcohol abuse Father    Diabetes Father    Cancer Sister        breast   Stroke Mother    Colon cancer Neg Hx     PE: BP (!) 142/76 (BP Location: Left Arm, Patient Position: Sitting, Cuff Size: Normal)   Pulse 82   Ht 5' 7.5" (1.715 m)   Wt 229 lb 9.6 oz (104.1 kg)   SpO2 98%   BMI 35.43 kg/m    Wt Readings from Last 3 Encounters:  03/15/22 229 lb 9.6 oz (104.1 kg)  02/19/22 229 lb 12.8 oz (104.2 kg)  12/09/21 228 lb (103.4 kg)   Constitutional: overweight, in NAD Eyes: no exophthalmos ENT: no masses palpated in neck, no cervical  lymphadenopathy Cardiovascular: RRR, No MRG Respiratory: CTA B Musculoskeletal: no deformities Skin: moist, warm, no rashes Neurological: no tremor with outstretched hands  ASSESSMENT: 1. DM2, uncontrolled, insulin-dep, with complications - CAD - CKD - PN - DR  - Tried to obtain U500 insulin but this was too expensive >> we had to go back to N and R insulin  2. HL  3.  Obesity class 1  4.  Elevated TSH  PLAN:  1. Patient with longstanding, uncontrolled, type 2 diabetes, on basal/bolus insulin regimen, with NPH and regular insulin due to cost in the past, then we added Ozempic and, at last visit, switched to analog insulins: Tresiba and NovoLog from patient assistance program. -At last visit, reviewing his CGM trends, sugars appears to be slightly better controlled, fluctuating mostly in the upper half of the target range or slightly above.  HbA1c was improved, at 8.1%, decreased from 8.8%. CGM interpretation: -At today's visit, we reviewed his CGM downloads: It appears that 47% of values are in target range (goal >70%), while 53% are higher than 180 (goal <25%), and 0% are lower than 70 (goal <4%).  The calculated average blood sugar is 182.  The projected HbA1c for the next 3 months (GMI) is 7.7%. -Reviewing the CGM trends, it appears that his sugars fluctuate around the upper limit of the target range, with slightly better blood sugars after dinner.  Upon questioning, he has a higher dose of mealtime insulin compared to basal insulin.  I advised him to increase his Levemir dose and to split the dose into.  We discussed that he may need to increase it even  further if sugars are still  not well controlled after this change.  In the meantime, I advised him to try to take slightly lower doses of NovoLog especially before dinner to avoid lower blood sugars overnight.  For now we will continue Ozempic. -I advised him to: Patient Instructions  Please continue: - Ozempic 1 mg  weekly  Change: - Levemir 40 units 2x a day (dosed 12h apart) - NovoLog - 15 min before a meal: 25-35 units before each meal  Please return for another visit in 3-4 months.   - we checked his HbA1c: 8.6% (higher) - advised to check sugars at different times of the day - 4x a day, rotating check times - advised for yearly eye exams >> he is UTD - return to clinic in 3-4 months   2. HL -Reviewed latest lipid panel from 05/2021: Excellent LDL, slightly low HDL.  Triglycerides greatly improved: Lab Results  Component Value Date   CHOL 96 06/12/2021   HDL 34.90 (L) 06/12/2021   LDLCALC 35 06/12/2021   LDLDIRECT 69.0 09/05/2020   TRIG 130.0 06/12/2021   CHOLHDL 3 06/12/2021  -He was on Zocor 40 mg daily and fenofibrate 145 mg daily without side effects.  Previously seen in the lipid clinic.  Currently on Lipitor 40 mg daily, gemfibrozil 600 mg twice a day, Zetia 10 mg daily.  3.  Obesity class 1 -Before the coronavirus pandemic, he was exercising at [employer], but he stopped exercising afterwards due to back pain + sciatica and also leg weakness. -He continues on Ozempic, which should also help with weight loss -He lost 4 pounds before last visit and 1 pound since then  4.  Elevated TSH -He had a slightly elevated TSH in 05/2020, [facility]Go, most recent TSH was normal: Lab Results  Component Value Date   TSH 3.22 06/12/2021  -No signs or symptoms of hypothyroidism -We will continue to keep an eye on this  Philemon Kingdom, MD PhD [facility]

## 2022-03-15 NOTE — Patient Instructions (Addendum)
Please continue: - Ozempic 1 mg weekly  Change: - Levemir 40 units 2x a day (dosed 12h apart) - NovoLog - 15 min before a meal: 25-35 units before each meal  Please return for another visit in 3-4 months.

## 2022-03-18 ENCOUNTER — Other Ambulatory Visit: Payer: Self-pay | Admitting: Adult Health

## 2022-04-06 ENCOUNTER — Other Ambulatory Visit: Payer: Self-pay | Admitting: Surgery

## 2022-04-06 NOTE — Telephone Encounter (Signed)
Pt needs to have PCP take over refills so they can monitor blood work.

## 2022-04-07 ENCOUNTER — Telehealth: Payer: Self-pay | Admitting: Adult Health

## 2022-04-07 NOTE — Telephone Encounter (Signed)
Pt's Spouse called to say they are still waiting for the atorvastatin (LIPITOR) 40 MG tablet   LOV: 06/12/21  [facility] [id number], [place] Phone: [phone]  Fax: [phone]

## 2022-04-12 ENCOUNTER — Other Ambulatory Visit: Payer: Self-pay | Admitting: Surgery

## 2022-04-13 NOTE — Telephone Encounter (Signed)
It looks like the Rx has been recently filled. Eric Choi does not prescribe this medication.

## 2022-04-15 ENCOUNTER — Telehealth: Payer: Self-pay

## 2022-04-15 NOTE — Telephone Encounter (Signed)
Pt contacted and wife advised Patient Assistance Novolog (8 boxes) and [place] (5 boxes) have been delivered and ready for pick up.

## 2022-04-16 NOTE — Telephone Encounter (Signed)
Patient's wife picked up 8 boxes of Novolog and 5 boxes of [place] (Patient Assistance for both) in office today.

## 2022-06-15 ENCOUNTER — Ambulatory Visit (INDEPENDENT_AMBULATORY_CARE_PROVIDER_SITE_OTHER): Payer: HMO | Admitting: Adult Health

## 2022-06-15 VITALS — BP 140/82 | HR 73 | Temp 98.0°F | Ht 67.0 in | Wt 233.0 lb

## 2022-06-15 DIAGNOSIS — R351 Nocturia: Secondary | ICD-10-CM

## 2022-06-15 DIAGNOSIS — Z0000 Encounter for general adult medical examination without abnormal findings: Secondary | ICD-10-CM | POA: Diagnosis not present

## 2022-06-15 DIAGNOSIS — E113393 Type 2 diabetes mellitus with moderate nonproliferative diabetic retinopathy without macular edema, bilateral: Secondary | ICD-10-CM

## 2022-06-15 DIAGNOSIS — I2510 Atherosclerotic heart disease of native coronary artery without angina pectoris: Secondary | ICD-10-CM | POA: Diagnosis not present

## 2022-06-15 DIAGNOSIS — E669 Obesity, unspecified: Secondary | ICD-10-CM | POA: Diagnosis not present

## 2022-06-15 DIAGNOSIS — N184 Chronic kidney disease, stage 4 (severe): Secondary | ICD-10-CM | POA: Diagnosis not present

## 2022-06-15 DIAGNOSIS — E782 Mixed hyperlipidemia: Secondary | ICD-10-CM | POA: Diagnosis not present

## 2022-06-15 DIAGNOSIS — I10 Essential (primary) hypertension: Secondary | ICD-10-CM | POA: Diagnosis not present

## 2022-06-15 DIAGNOSIS — N401 Enlarged prostate with lower urinary tract symptoms: Secondary | ICD-10-CM

## 2022-06-15 LAB — COMPREHENSIVE METABOLIC PANEL
ALT: 17 U/L (ref 0–53)
AST: 16 U/L (ref 0–37)
Albumin: 4.5 g/dL (ref 3.5–5.2)
Alkaline Phosphatase: 74 U/L (ref 39–117)
BUN: 28 mg/dL — ABNORMAL HIGH (ref 6–23)
CO2: 23 mEq/L (ref 19–32)
Calcium: 9.7 mg/dL (ref 8.4–10.5)
Chloride: 104 mEq/L (ref 96–112)
Creatinine, Ser: 2.2 mg/dL — ABNORMAL HIGH (ref 0.40–1.50)
GFR: 27.69 mL/min — ABNORMAL LOW (ref >60.00)
Glucose, Bld: 192 mg/dL — ABNORMAL HIGH (ref 70–99)
Potassium: 5.3 mEq/L — ABNORMAL HIGH (ref 3.5–5.1)
Sodium: 138 mEq/L (ref 135–145)
Total Bilirubin: 0.5 mg/dL (ref 0.2–1.2)
Total Protein: 7.2 g/dL (ref 6.0–8.3)

## 2022-06-15 LAB — CBC WITH DIFFERENTIAL/PLATELET
Basophils Absolute: 0.1 10*3/uL (ref 0.0–0.1)
Basophils Relative: 0.9 % (ref 0.0–3.0)
Eosinophils Absolute: 0.7 10*3/uL (ref 0.0–0.7)
Eosinophils Relative: 6.4 % — ABNORMAL HIGH (ref 0.0–5.0)
HCT: 42.6 % (ref 39.0–52.0)
Hemoglobin: 14.1 g/dL (ref 13.0–17.0)
Lymphocytes Relative: 23.5 % (ref 12.0–46.0)
Lymphs Abs: 2.6 10*3/uL (ref 0.7–4.0)
MCHC: 33.2 g/dL (ref 30.0–36.0)
MCV: 89.6 fl (ref 78.0–100.0)
Monocytes Absolute: 1.2 10*3/uL — ABNORMAL HIGH (ref 0.1–1.0)
Monocytes Relative: 10.8 % (ref 3.0–12.0)
Neutro Abs: 6.5 10*3/uL (ref 1.4–7.7)
Neutrophils Relative %: 58.4 % (ref 43.0–77.0)
Platelets: 311 10*3/uL (ref 150.0–400.0)
RBC: 4.75 Mil/uL (ref 4.22–5.81)
RDW: 15.1 % (ref 11.5–15.5)
WBC: 11.1 10*3/uL — ABNORMAL HIGH (ref 4.0–10.5)

## 2022-06-15 LAB — LIPID PANEL
Cholesterol: 120 mg/dL (ref 0–200)
HDL: 42.2 mg/dL (ref >39.00)
LDL Cholesterol: 53 mg/dL (ref 0–99)
NonHDL: 77.31
Total CHOL/HDL Ratio: 3
Triglycerides: 123 mg/dL (ref 0.0–149.0)
VLDL: 24.6 mg/dL (ref 0.0–40.0)

## 2022-06-15 LAB — HEPATIC FUNCTION PANEL
ALT: 17 U/L (ref 0–53)
AST: 16 U/L (ref 0–37)
Albumin: 4.5 g/dL (ref 3.5–5.2)
Alkaline Phosphatase: 74 U/L (ref 39–117)
Bilirubin, Direct: 0.1 mg/dL (ref 0.0–0.3)
Total Bilirubin: 0.5 mg/dL (ref 0.2–1.2)
Total Protein: 7.2 g/dL (ref 6.0–8.3)

## 2022-06-15 LAB — VITAMIN D 25 HYDROXY (VIT D DEFICIENCY, FRACTURES): VITD: 26.73 ng/mL — ABNORMAL LOW (ref 30.00–100.00)

## 2022-06-15 LAB — PSA: PSA: 1.06 ng/mL (ref 0.10–4.00)

## 2022-06-15 LAB — TSH: TSH: 3.54 u[IU]/mL (ref 0.35–5.50)

## 2022-06-15 NOTE — Patient Instructions (Signed)
It was great seeing you today   We will follow up with you regarding your lab work   Please let me know if you need anything   

## 2022-06-15 NOTE — Progress Notes (Signed)
Subjective:    Patient ID: Eric Choi, male    DOB: 1941/03/31, 82 y.o.   MRN: [id number]  HPI  Patient presents for yearly preventative medicine examination. He is a pleasant 82 year old male who  has a past medical history of CAD (coronary artery disease), Colon polyp, DIABETES MELLITUS, TYPE II (11/03/2006), Dyspnea, HYPERLIPIDEMIA (11/03/2006), HYPERTENSION (11/03/2006), Myocardial infarction ([place]), and PAD (peripheral artery disease) ([place]).  Hypertension-is currently prescribed Norvasc 10 mg, lisinopril 30 mg and metoprolol 50 mg.  He denies dizziness, lightheadedness, chest pain, shortness of breath, or syncopal episodes.  He does monitor his blood pressures at home and reports readings between 120 and 130 over 70s to 80s. BP Readings from Last 3 Encounters:  06/15/22 (!) 140/82  03/15/22 (!) 142/76  02/19/22 136/72    Hyper lipidemia-managed with Lipitor 40,  Zetia10 mg and Lopid 600 mg daily. He denies myalgia or fatigue Lab Results  Component Value Date   CHOL 96 06/12/2021   HDL 34.90 (L) 06/12/2021   LDLCALC 35 06/12/2021   LDLDIRECT 69.0 09/05/2020   TRIG 130.0 06/12/2021   CHOLHDL 3 06/12/2021   Diabetes mellitus-managed by endocrinology.  Currently prescribed Ozempic 1.'0mg'$  weekly  insulin R 15 to 35 units 3 times a day and Levemir 70 units QHS. He does use a continuous glucose monitor.  Reports that his sugars have up and down  Lab Results  Component Value Date   HGBA1C 8.1 (A) 12/09/2021    CAD-followed by cardiology.  Had a cath in 1994 but no cath since then.  Nuclear stress in 2021 showed no ischemia.  Echo in September 2021 with LVEF equal to 50%.  Does continue to have mild dyspnea that is unchanged over the past few years.  PAD-followed by vein and vascular.  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  He is  on lisinopril and also on Ozempic.  He knows to avoid NSAIDs   All immunizations and health maintenance protocols were reviewed with the patient and needed orders were placed.  Appropriate screening laboratory values were ordered for the patient including screening of hyperlipidemia, renal function and hepatic function. If indicated by BPH, a PSA was ordered.  Medication reconciliation,  past medical history, social history, problem list and allergies were reviewed in detail with the patient  Goals were established with regard to weight loss, exercise, and  diet in compliance with medications  Wt Readings from Last 3 Encounters:  06/15/22 233 lb (105.7 kg)  03/15/22 229 lb 9.6 oz (104.1 kg)  02/19/22 229 lb 12.8 oz (104.2 kg)   Review of Systems  Constitutional: Negative.   HENT: Negative.    Eyes: Negative.   Respiratory: Negative.    Cardiovascular: Negative.   Gastrointestinal: Negative.   Endocrine: Negative.   Genitourinary: Negative.   Musculoskeletal:  Positive for arthralgias.  Skin: Negative.   Allergic/Immunologic: Negative.   Neurological: Negative.   Hematological: Negative.   Psychiatric/Behavioral: Negative.    All other systems reviewed and are negative.  Past Medical History:  Diagnosis Date   CAD (coronary artery disease)    Colon polyp    DIABETES MELLITUS, TYPE II 11/03/2006   Dyspnea    HYPERLIPIDEMIA 11/03/2006   HYPERTENSION 11/03/2006   Myocardial infarction [facility])    PAD (peripheral artery disease) (HCC)  Social History   Socioeconomic History   Marital status: Married    Spouse name: Not on file   Number of children: 2   Years of education: Not on file   Highest education level: Not on file  Occupational History   Occupation: Retired-Truck driver  Tobacco Use   Smoking status: Former    Types: Cigarettes    Quit date: 05/18/1991    Years since quitting: 31.0    Passive exposure: Never   Smokeless tobacco: Never  Vaping Use    Vaping Use: Never used  Substance and Sexual Activity   Alcohol use: No   Drug use: No   Sexual activity: Not on file  Other Topics Concern   Not on file  Social History Narrative   Works 3rd shift   Regular Exercise-yes   Social Determinants of Health   Financial Resource Strain: [place]  (01/31/2020)   Overall Financial Resource Strain (CARDIA)    Difficulty of Paying Living Expenses: Not hard at all  Food Insecurity: No Food Insecurity (01/07/2020)   Hunger Vital Sign    Worried About Running Out of Food in the Last Year: Never true    [place] in the Last Year: Never true  Transportation Needs: No Transportation Needs (01/31/2020)   PRAPARE - Hydrologist (Medical): No    Lack of Transportation (Non-Medical): No  Physical Activity: Not on file  Stress: Not on file  Social Connections: Not on file  Intimate Partner Violence: Not on file    Past Surgical History:  Procedure Laterality Date   ANGIOPLASTY  20 years ago   percutaneous transluminal    CATARACT EXTRACTION  2002, 2005   bilateral   ENDARTERECTOMY Right 12/31/2020   Procedure: RIGHT CAROTID ENDARTERECTOMY;  Surgeon: Serafina Mitchell, MD;  Location: [facility] OR;  Service: Vascular;  Laterality: Right;   PATCH ANGIOPLASTY Right 12/31/2020   Procedure: PATCH ANGIOPLASTY OF RIGHT CAROTID ARTERY USING XENOSURE BOVINE PERICARDIUM PATCH;  Surgeon: Serafina Mitchell, MD;  Location: [facility] OR;  Service: Vascular;  Laterality: Right;    Family History  Problem Relation Age of Onset   COPD Father    Alcohol abuse Father    Diabetes Father    Cancer Sister        breast   Stroke Mother    Colon cancer Neg Hx     No Known Allergies  Current Outpatient Medications on File Prior to Visit  Medication Sig Dispense Refill   cholecalciferol (VITAMIN D3) 25 MCG (1000 UNIT) tablet Take 1,000 Units by mouth daily.     famotidine (PEPCID) 20 MG tablet Take 20 mg by mouth daily.     acetaminophen  (TYLENOL) 500 MG tablet Take 500 mg by mouth every 6 (six) hours as needed for moderate pain.     amLODipine (NORVASC) 10 MG tablet TAKE ONE TABLET BY MOUTH DAILY 90 tablet 3   aspirin 81 MG tablet Take 81 mg by mouth daily.     atorvastatin (LIPITOR) 40 MG tablet TAKE ONE TABLET BY MOUTH DAILY 90 tablet 4   Continuous Blood Gluc Receiver (FREESTYLE LIBRE 2 READER) DEVI Use as instructed 1 each 0   Continuous Blood Gluc Sensor (FREESTYLE LIBRE 2 SENSOR) MISC CHANGE EVERY 14 DAYS AS DIRECTED 6 each 3   ezetimibe (ZETIA) 10 MG tablet TAKE ONE TABLET BY MOUTH DAILY 90 tablet 1   gemfibrozil (LOPID) 600 MG tablet TAKE 1 TABLET BY MOUTH  DAILY AT 2:00 IN THE AFTERNOON 90 tablet 1   insulin aspart (NOVOLOG FLEXPEN) 100 UNIT/ML FlexPen Inject 15-35 Units into the skin 3 (three) times daily before meals. (Patient taking differently: Inject 15-35 Units into the skin 3 (three) times daily before meals. 35 units 3 times daily) 45 mL 3   insulin detemir (LEVEMIR FLEXPEN) 100 UNIT/ML FlexPen Inject 70 Units into the skin at bedtime. 45 mL 3   Insulin Pen Needle (TECHLITE PEN NEEDLES) 31G X 5 MM MISC Use as directed with insulin. 100 each 0   lisinopril (ZESTRIL) 30 MG tablet TAKE ONE TABLET BY MOUTH DAILY 90 tablet 3   METAMUCIL FIBER PO Take 1 Dose by mouth daily. 1 dose= 1 tablespoon     metoprolol tartrate (LOPRESSOR) 50 MG tablet TAKE ONE TABLET BY MOUTH DAILY 90 tablet 3   NEEDLE, DISP, 30 G 30G X 1" MISC by Does not apply route as directed.     Semaglutide, 1 MG/DOSE, 4 MG/3ML SOPN Inject 1 mg as directed once a week. 9 mL 3   No current facility-administered medications on file prior to visit.    BP (!) 140/82   Pulse 73   Temp 98 F (36.7 C) (Oral)   Ht '5\' 7"'$  (1.702 m)   Wt 233 lb (105.7 kg)   SpO2 98%   BMI 36.49 kg/m       Objective:   Physical Exam Vitals and nursing note reviewed.  Constitutional:      General: He is not in acute distress.    Appearance: Normal appearance. He is  not ill-appearing.  HENT:     Head: Normocephalic and atraumatic.     Right Ear: Tympanic membrane, ear canal and external ear normal. There is no impacted cerumen.     Left Ear: Tympanic membrane, ear canal and external ear normal. There is no impacted cerumen.     Nose: Nose normal. No congestion or rhinorrhea.     Mouth/Throat:     Mouth: Mucous membranes are moist.     Pharynx: Oropharynx is clear.  Eyes:     Extraocular Movements: Extraocular movements intact.     Conjunctiva/sclera: Conjunctivae normal.     Pupils: Pupils are equal, round, and reactive to light.  Neck:     Vascular: No carotid bruit.  Cardiovascular:     Rate and Rhythm: Normal rate and regular rhythm.     Pulses: Normal pulses.     Heart sounds: No murmur heard.    No friction rub. No gallop.  Pulmonary:     Effort: Pulmonary effort is normal.     Breath sounds: Normal breath sounds.  Abdominal:     General: Abdomen is flat. Bowel sounds are normal. There is no distension.     Palpations: Abdomen is soft. There is no mass.     Tenderness: There is no abdominal tenderness. There is no guarding or rebound.     Hernia: No hernia is present.  Musculoskeletal:        General: Normal range of motion.     Cervical back: Normal range of motion and neck supple.  Lymphadenopathy:     Cervical: No cervical adenopathy.  Skin:    General: Skin is warm and dry.     Capillary Refill: Capillary refill takes less than 2 seconds.  Neurological:     General: No focal deficit present.     Mental Status: He is alert and oriented to person, place, and time.  Psychiatric:  Mood and Affect: Mood normal.        Behavior: Behavior normal.        Thought Content: Thought content normal.        Judgment: Judgment normal.       Assessment & Plan:  1. Routine general medical examination at a health care facility Today patient counseled on age appropriate routine health concerns for screening and prevention, each  reviewed and up to date or declined. Immunizations reviewed and up to date or declined. Labs ordered and reviewed. Risk factors for depression reviewed and negative. Hearing function and visual acuity are intact. ADLs screened and addressed as needed. Functional ability and level of safety reviewed and appropriate. Education, counseling and referrals performed based on assessed risks today. Patient provided with a copy of personalized plan for preventive services. - Advised of vaccinations at pharmacy  - Encouraged weight loss - Follow up in one year or sooner if needed  2. Mixed hyperlipidemia - Per lipid clinic  - Comprehensive metabolic panel; Future - PSA; Future - VITAMIN D 25 Hydroxy (Vit-D Deficiency, Fractures); Future - Lipid panel; Future - TSH; Future  3. Coronary artery disease involving native coronary artery of native heart without angina pectoris - Per lipid clinic  - Comprehensive metabolic panel; Future - PSA; Future - VITAMIN D 25 Hydroxy (Vit-D Deficiency, Fractures); Future - Lipid panel; Future - TSH; Future  4. Essential hypertension - Better controlled at home  - Comprehensive metabolic panel; Future - PSA; Future - VITAMIN D 25 Hydroxy (Vit-D Deficiency, Fractures); Future - Lipid panel; Future - TSH; Future  5. BPH associated with nocturia  - Hepatic function panel; Future  6. Obesity with serious comorbidity, unspecified classification, unspecified obesity type - encouraged weight loss through diet and exercise  - Comprehensive metabolic panel; Future - Hepatic function panel; Future - PSA; Future - VITAMIN D 25 Hydroxy (Vit-D Deficiency, Fractures); Future - Lipid panel; Future - TSH; Future  7. 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 Chronic kidney disease, stage 4 (severe) (HCC) - Per nephrology  - Comprehensive metabolic panel; Future - PSA; Future - VITAMIN D 25 Hydroxy (Vit-D Deficiency, Fractures); Future - Lipid panel; Future - TSH; Future  [name], NP

## 2022-06-24 ENCOUNTER — Other Ambulatory Visit: Payer: Self-pay | Admitting: Adult Health

## 2022-06-24 DIAGNOSIS — E785 Hyperlipidemia, unspecified: Secondary | ICD-10-CM

## 2022-06-26 ENCOUNTER — Other Ambulatory Visit: Payer: Self-pay | Admitting: Adult Health

## 2022-07-02 ENCOUNTER — Encounter: Payer: Self-pay | Admitting: Internal Medicine

## 2022-07-02 ENCOUNTER — Ambulatory Visit (INDEPENDENT_AMBULATORY_CARE_PROVIDER_SITE_OTHER): Payer: PPO | Admitting: Internal Medicine

## 2022-07-02 VITALS — BP 150/100 | HR 82 | Ht 67.0 in | Wt 231.4 lb

## 2022-07-02 DIAGNOSIS — E785 Hyperlipidemia, unspecified: Secondary | ICD-10-CM | POA: Diagnosis not present

## 2022-07-02 DIAGNOSIS — E1159 Type 2 diabetes mellitus with other circulatory complications: Secondary | ICD-10-CM

## 2022-07-02 DIAGNOSIS — E1165 Type 2 diabetes mellitus with hyperglycemia: Secondary | ICD-10-CM

## 2022-07-02 DIAGNOSIS — E669 Obesity, unspecified: Secondary | ICD-10-CM | POA: Diagnosis not present

## 2022-07-02 DIAGNOSIS — R7989 Other specified abnormal findings of blood chemistry: Secondary | ICD-10-CM

## 2022-07-02 LAB — POCT GLYCOSYLATED HEMOGLOBIN (HGB A1C): Hemoglobin A1C: 7.9 % — AB (ref 4.0–5.6)

## 2022-07-02 NOTE — Addendum Note (Signed)
Addended by: Lauralyn Primes on: 07/02/2022 08:14 AM   Modules accepted: Orders

## 2022-07-02 NOTE — Patient Instructions (Addendum)
Please continue: - Ozempic 1 mg weekly - NovoLog - 15 min before a meal: 40 units before each meal  Decrease: - Tresiba 56 units 2x a day  Please return for another visit in 3-4 months.

## 2022-07-02 NOTE — Progress Notes (Signed)
Patient ID: Eric Choi, male   DOB: 06/13/1940, 82 y.o.   MRN: [id number]  HPI: Eric Choi is a 82 y.o.-year-old male, returning for f/u for DM2 dx 1990, insulin-dependent since 2005, uncontrolled, with complications (CAD, CKD, PN, DR, PVD). Last visit was 4 months ago.  Interim history: No increased urination, nausea, chest pain.   He does have blurry vision as he has a hard time adjusting to transitional lenses. He otherwise feels well and has good energy.  Reviewed HbA1c levels: Lab Results  Component Value Date   HGBA1C 8.1 (A) 12/09/2021   HGBA1C 8.8 (A) 08/06/2021   HGBA1C 8.3 (A) 04/28/2021   He was previously on: Insulin Before breakfast Before lunch Before dinner Bedtime  Regular 30-35 units 15-20 units 35-38 units -  NPH 35 units -  - 45 units  - Ozempic 0.5 mg weekly (PAP)  We changed to: - Ozempic 1 mg weekly - Tresiba >> Levemir 70 units at bedtime >> 40 >> Tresiba 70 units 2x a day - NovoLog - 15 min before a meal 35 units before each meal >> 25-35 >> 40 units before each meal  We tried U500 insulin but this was too expensive. He did not start Trulicity due to price - 145$.  Pt checks his sugars  more than 4 times a day with his freestyle libre 2 CGM:  Previously:   Previously:  Lowest CBG: 42 x1 >> 80 >> 50s >> 65 >> 60s; he has hypoglycemia awareness in the 90s. Highest CBG: 444 >> .Marland KitchenMarland Kitchen300s >> >254 >> 330 >> 300s.  Pt's meals are: - Breakfast: cereal + milk + eggs + bacon/sausage - Lunch: egg sandwichTy!  - Dinner: meat + veggies + starch - Snacks: 1-2: including after dinner; pears + mayonnaise, milk + crackers; cake  -+ CKD, last BUN/creatinine:  Lab Results  Component Value Date   BUN 28 (H) 06/15/2022   CREATININE 2.20 (H) 06/15/2022  10/16/2021: 30/1.97, GFR 34, glucose 203 On lisinopril.  -+ HL; last set of lipids: Lab Results  Component Value Date   CHOL 120 06/15/2022   HDL 42.20 06/15/2022   LDLCALC 53 06/15/2022   LDLDIRECT  69.0 09/05/2020   TRIG 123.0 06/15/2022   CHOLHDL 3 06/15/2022  On Lipitor 40, gemfibrozil 600, Zetia 10.    - last eye exam was on 01/28/2022: + DR reportedly. Dr. Radene Ou.    - + numbness and tingling in his feet.  Latest foot exam: 05/2021.  He also has HTN. He had right carotid endarterectomy 12/31/2020. He has a history of claudication, which improved.  TSH is slightly elevated in the past but it normalized: Lab Results  Component Value Date   TSH 3.54 06/15/2022   TSH 3.22 06/12/2021   TSH 3.67 08/26/2020   TSH 4.52 (H) 06/11/2020   TSH 4.18 06/05/2019   TSH 3.61 09/08/2018   TSH 4.64 (H) 03/09/2018   TSH 2.90 03/11/2017   TSH 2.69 03/05/2016   TSH 1.62 03/12/2015   TSH 4.04 02/01/2014   TSH 2.38 10/06/2010   TSH 2.80 12/27/2008   He lost his twin brother to cancer 05/2018.  ROS: see HPI  I reviewed pt's medications, allergies, PMH, social hx, family hx, and changes were documented in the history of present illness. Otherwise, unchanged from my initial visit note.  Past Medical History:  Diagnosis Date   CAD (coronary artery disease)    Colon polyp    DIABETES MELLITUS, TYPE II 11/03/2006   Dyspnea  HYPERLIPIDEMIA 11/03/2006   HYPERTENSION 11/03/2006   Myocardial infarction [facility])    PAD (peripheral artery disease) (HCC)    Past Surgical History:  Procedure Laterality Date   ANGIOPLASTY  20 years ago   percutaneous transluminal    CATARACT EXTRACTION  2002, 2005   bilateral   ENDARTERECTOMY Right 12/31/2020   Procedure: RIGHT CAROTID ENDARTERECTOMY;  Surgeon: Serafina Mitchell, MD;  Location: [facility] OR;  Service: Vascular;  Laterality: Right;   PATCH ANGIOPLASTY Right 12/31/2020   Procedure: PATCH ANGIOPLASTY OF RIGHT CAROTID ARTERY USING XENOSURE BOVINE PERICARDIUM PATCH;  Surgeon: Serafina Mitchell, MD;  Location: [facility] OR;  Service: Vascular;  Laterality: Right;   Social History   Socioeconomic History   Marital status: Married    Spouse name: Not on file    Number of children: 2   Years of education: Not on file   Highest education level: Not on file  Occupational History   Occupation: Retired-Truck driver  Tobacco Use   Smoking status: Former    Types: Cigarettes    Quit date: 05/18/1991    Years since quitting: 31.1    Passive exposure: Never   Smokeless tobacco: Never  Vaping Use   Vaping Use: Never used  Substance and Sexual Activity   Alcohol use: No   Drug use: No   Sexual activity: Not on file  Other Topics Concern   Not on file  Social History Narrative   Works 3rd shift   Regular Exercise-yes   Social Determinants of Health   Financial Resource Strain: [place]  (01/31/2020)   Overall Financial Resource Strain (CARDIA)    Difficulty of Paying Living Expenses: Not hard at all  Food Insecurity: No Food Insecurity (01/07/2020)   Hunger Vital Sign    Worried About Running Out of Food in the Last Year: Never true    [place] in the Last Year: Never true  Transportation Needs: No Transportation Needs (01/31/2020)   PRAPARE - Hydrologist (Medical): No    Lack of Transportation (Non-Medical): No  Physical Activity: Not on file  Stress: Not on file  Social Connections: Not on file  Intimate Partner Violence: Not on file   Current Outpatient Medications on File Prior to Visit  Medication Sig Dispense Refill   acetaminophen (TYLENOL) 500 MG tablet Take 500 mg by mouth every 6 (six) hours as needed for moderate pain.     amLODipine (NORVASC) 10 MG tablet TAKE ONE TABLET BY MOUTH DAILY 90 tablet 3   aspirin 81 MG tablet Take 81 mg by mouth daily.     atorvastatin (LIPITOR) 40 MG tablet TAKE ONE TABLET BY MOUTH DAILY 90 tablet 4   cholecalciferol (VITAMIN D3) 25 MCG (1000 UNIT) tablet Take 1,000 Units by mouth daily.     Continuous Blood Gluc Receiver (FREESTYLE LIBRE 2 READER) DEVI Use as instructed 1 each 0   Continuous Blood Gluc Sensor (FREESTYLE LIBRE 2 SENSOR) MISC CHANGE EVERY 14 DAYS  AS DIRECTED 6 each 3   ezetimibe (ZETIA) 10 MG tablet TAKE ONE TABLET BY MOUTH DAILY 90 tablet 1   famotidine (PEPCID) 20 MG tablet Take 20 mg by mouth daily.     gemfibrozil (LOPID) 600 MG tablet TAKE 1 TABLET BY MOUTH DAILY AT 2:00 IN THE AFTERNOON 90 tablet 1   insulin aspart (NOVOLOG FLEXPEN) 100 UNIT/ML FlexPen Inject 15-35 Units into the skin 3 (three) times daily before meals. (Patient taking differently: Inject 15-35  Units into the skin 3 (three) times daily before meals. 35 units 3 times daily) 45 mL 3   insulin detemir (LEVEMIR FLEXPEN) 100 UNIT/ML FlexPen Inject 70 Units into the skin at bedtime. 45 mL 3   Insulin Pen Needle (TECHLITE PEN NEEDLES) 31G X 5 MM MISC Use as directed with insulin. 100 each 0   lisinopril (ZESTRIL) 30 MG tablet TAKE ONE TABLET BY MOUTH DAILY 90 tablet 3   METAMUCIL FIBER PO Take 1 Dose by mouth daily. 1 dose= 1 tablespoon     metoprolol tartrate (LOPRESSOR) 50 MG tablet TAKE ONE TABLET BY MOUTH DAILY 90 tablet 3   NEEDLE, DISP, 30 G 30G X 1" MISC by Does not apply route as directed.     Semaglutide, 1 MG/DOSE, 4 MG/3ML SOPN Inject 1 mg as directed once a week. 9 mL 3   No current facility-administered medications on file prior to visit.   No Known Allergies Family History  Problem Relation Age of Onset   COPD Father    Alcohol abuse Father    Diabetes Father    Cancer Sister        breast   Stroke Mother    Colon cancer Neg Hx     PE: BP (!) 150/100 (BP Location: Right Arm, Patient Position: Sitting, Cuff Size: Normal)   Pulse 82   Ht 5' 7"$  (1.702 m)   Wt 231 lb 6.4 oz (105 kg)   SpO2 99%   BMI 36.24 kg/m    Wt Readings from Last 3 Encounters:  07/02/22 231 lb 6.4 oz (105 kg)  06/15/22 233 lb (105.7 kg)  03/15/22 229 lb 9.6 oz (104.1 kg)   Constitutional: overweight, in NAD Eyes: no exophthalmos ENT: no masses palpated in neck, no cervical lymphadenopathy Cardiovascular: RRR, No MRG Respiratory: CTA B Musculoskeletal: no  deformities Skin: moist, warm, no rashes Neurological: no tremor with outstretched hands Diabetic Foot Exam - Simple   Simple Foot Form Diabetic Foot exam was performed with the following findings: Yes 07/02/2022  8:22 AM  Visual Inspection No deformities, no ulcerations, no other skin breakdown bilaterally: Yes Sensation Testing See comments: Yes Pulse Check See comments: Yes Comments Decreased pedal pulses B Almost no sensation to monofilament Dry skin Onychodystrophy     ASSESSMENT: 1. DM2, uncontrolled, insulin-dep, with complications - CAD - CKD - PN - DR  - Tried to obtain U500 insulin but this was too expensive >> we had to go back to N and R insulin  2. HL  3.  Obesity class 1  4.  Elevated TSH  PLAN:  1. Patient with longstanding, uncontrolled, type 2 diabetes, on basal-bolus insulin regimen and GLP-1 receptor agonist, with still poor control.  At last visit, HbA1c was 8.6%, higher. -At last visit, sugars were fluctuating around the upper limit of the target range, with slightly better blood sugars after dinner.  Upon questioning, he had a higher dose of mealtime insulin compared to basal insulin per day so I advised him to decrease the Levemir dose and split it into 2 doses 12 hours apart.  I also advised him to take slightly lower doses of NovoLog especially before dinner to avoid low blood sugars overnight.  We continued Ozempic. -Since last visit, he had to switch from Levemir to [place].  He saw significant improvement in blood sugars afterwards. CGM interpretation: -At today's visit, we reviewed his CGM downloads: It appears that 88% of values are in target range (goal >70%), while 13%  are higher than 180 (goal <25%), and 2% are lower than 70 (goal <4%).  The calculated average blood sugar is 137.  The projected HbA1c for the next 3 months (GMI) is 6.6%. -Reviewing the CGM trends, sugars are much better.  They are occasionally increasing after meals, especially  after dinner, but the vast majority of the blood sugars are at goal.  He has occasional low blood sugars under 70s in the afternoon and also evening and night for.  Therefore, I recommended to decrease the Tresiba dose.  Otherwise, for now, I recommended to continue the same regimen. -I advised him to: Patient Instructions  Please continue: - Ozempic 1 mg weekly - NovoLog - 15 min before a meal: 40 units before each meal  Decrease: - Tresiba 56 units 2x a day  Please return for another visit in 3-4 months.   - we checked his HbA1c: 7.9% (lower) - advised to check sugars at different times of the day - 4x a day, rotating check times - advised for yearly eye exams >> he is UTD - at today's visit, he has decreased circulation in his feet (known PAD), also, almost no sensation to monofilament and onychomycosis.  I strongly recommended to see a podiatrist. - return to clinic in 3-4 months   2. HL -Reviewed latest lipid panel from 05/2022: All fractions at goal: Lab Results  Component Value Date   CHOL 120 06/15/2022   HDL 42.20 06/15/2022   LDLCALC 53 06/15/2022   LDLDIRECT 69.0 09/05/2020   TRIG 123.0 06/15/2022   CHOLHDL 3 06/15/2022  -He was previously seen in the lipid clinic.  Currently on Lipitor 40 mg daily, gemfibrozil 600 mg twice a day, Zetia 10 mg daily.  He tolerates these well.  3.  Obesity class 2 -Before the coronavirus pandemic, he was exercising at [employer], but he stopped exercising afterwards due to back pain + sciatica and also leg weakness. -He continues on Ozempic which should also help with weight loss -He gained 2 pounds since last visit  4.  Elevated TSH -He had a slightly elevated TSH in 05/2020 but most recent TSH was normal:  Lab Results  Component Value Date   TSH 3.54 06/15/2022  -No symptoms of hypothyroidism  Philemon Kingdom, MD PhD [facility]

## 2022-07-26 ENCOUNTER — Other Ambulatory Visit (HOSPITAL_COMMUNITY): Payer: Self-pay

## 2022-07-26 ENCOUNTER — Other Ambulatory Visit: Payer: Self-pay | Admitting: Internal Medicine

## 2022-07-26 MED ORDER — TECHLITE PEN NEEDLES 31G X 5 MM MISC
5 refills | Status: DC
  Filled 2022-07-26: qty 100, 17d supply, fill #0
  Filled 2022-08-10 (×2): qty 100, 17d supply, fill #1
  Filled 2023-06-14: qty 100, 17d supply, fill #2

## 2022-08-09 ENCOUNTER — Telehealth: Payer: Self-pay | Admitting: Internal Medicine

## 2022-08-09 ENCOUNTER — Other Ambulatory Visit (HOSPITAL_COMMUNITY): Payer: Self-pay

## 2022-08-09 DIAGNOSIS — E1165 Type 2 diabetes mellitus with hyperglycemia: Secondary | ICD-10-CM

## 2022-08-09 MED ORDER — TRESIBA FLEXTOUCH 100 UNIT/ML ~~LOC~~ SOPN
56.0000 [IU] | PEN_INJECTOR | Freq: Two times a day (BID) | SUBCUTANEOUS | 3 refills | Status: DC
  Filled 2022-08-09: qty 30, 27d supply, fill #0

## 2022-08-09 MED ORDER — NOVOLOG FLEXPEN 100 UNIT/ML ~~LOC~~ SOPN
15.0000 [IU] | PEN_INJECTOR | Freq: Three times a day (TID) | SUBCUTANEOUS | 1 refills | Status: DC
  Filled 2022-08-09: qty 45, 42d supply, fill #0

## 2022-08-09 NOTE — Telephone Encounter (Signed)
MEDICATION: 1)  Eric Choi FlexTouch insulin degludec (TRESIBA FLEXTOUCH) 100 UNIT/ML FlexTouch Pen  2) NovoLOG FlexPen insulin aspart (NOVOLOG FLEXPEN) 100 UNIT/ML FlexPen  PHARMACY:  [facility] (Ph: [phone])   HAS THE PATIENT CONTACTED THEIR PHARMACY?  Yes  IS THIS A 90 DAY SUPPLY : Yes  IS PATIENT OUT OF MEDICATION: Yes  IF NOT; HOW MUCH IS LEFT:   LAST APPOINTMENT DATE: @2 /16/2024  NEXT APPOINTMENT DATE:@7 /09/2022  DO WE HAVE YOUR PERMISSION TO LEAVE A DETAILED MESSAGE?: Yes  OTHER COMMENTS:    **Let patient know to contact pharmacy at the end of the day to make sure medication is ready. **  ** Please notify patient to allow 48-72 hours to process**  **Encourage patient to contact the pharmacy for refills or they can request refills through [facility]**

## 2022-08-09 NOTE — Telephone Encounter (Signed)
Rx sent 

## 2022-08-09 NOTE — Telephone Encounter (Signed)
Patient came in to office and completed the [employer] Patient Garment/textile technologist.  The completed for and financial information are in Dr. Arman Filter folder in the front office.

## 2022-08-10 ENCOUNTER — Other Ambulatory Visit (HOSPITAL_COMMUNITY): Payer: Self-pay

## 2022-08-11 ENCOUNTER — Other Ambulatory Visit: Payer: Self-pay | Admitting: *Deleted

## 2022-08-11 DIAGNOSIS — I6523 Occlusion and stenosis of bilateral carotid arteries: Secondary | ICD-10-CM

## 2022-08-23 ENCOUNTER — Ambulatory Visit (HOSPITAL_COMMUNITY)
Admission: RE | Admit: 2022-08-23 | Discharge: 2022-08-23 | Disposition: A | Discharge status: Home / Self Care | Payer: PPO | Source: Ambulatory Visit | Attending: Surgery | Admitting: Surgery

## 2022-08-23 ENCOUNTER — Ambulatory Visit (INDEPENDENT_AMBULATORY_CARE_PROVIDER_SITE_OTHER): Payer: PPO | Admitting: Physician Assistant

## 2022-08-23 ENCOUNTER — Encounter: Payer: Self-pay | Admitting: Physician Assistant

## 2022-08-23 ENCOUNTER — Telehealth: Payer: Self-pay | Admitting: Internal Medicine

## 2022-08-23 VITALS — BP 137/67 | HR 77 | Temp 98.2°F | Resp 18 | Ht 66.0 in | Wt 235.0 lb

## 2022-08-23 DIAGNOSIS — I6523 Occlusion and stenosis of bilateral carotid arteries: Secondary | ICD-10-CM | POA: Diagnosis not present

## 2022-08-23 NOTE — Progress Notes (Signed)
History of Present Illness:  Patient is a 82 y.o. year old male who presents for evaluation of carotid stenosis.  He is s/p right CEA by Dr. Myra Gianotti on 12/31/20. This was performed secondary to asymptomatic high grade stenosis.  The patient denies symptoms of TIA, amaurosis, or stroke.     He is compliant with his statin and Aspirin.  His wife is with him today and states soon they will be celebrating the 28 st anniversary.     The pt is on a statin for cholesterol management.  The pt is on a daily aspirin.   Other AC:  none The pt is on CCB, ACE,BB for hypertension.   The pt is diabetic.   Tobacco hx:  Former, 1993      Past Surgical History:  Procedure Laterality Date   ANGIOPLASTY  20 years ago   percutaneous transluminal    CATARACT EXTRACTION  2002, 2005   bilateral   ENDARTERECTOMY Right 12/31/2020   Procedure: RIGHT CAROTID ENDARTERECTOMY;  Surgeon: Nada Libman, MD;  Location: [facility] OR;  Service: Vascular;  Laterality: Right;   PATCH ANGIOPLASTY Right 12/31/2020   Procedure: PATCH ANGIOPLASTY OF RIGHT CAROTID ARTERY USING XENOSURE BOVINE PERICARDIUM PATCH;  Surgeon: Nada Libman, MD;  [facility]: [facility] OR;  Service: Vascular;  Laterality: Right;     Social History Social History   Tobacco Use   Smoking status: Former    Types: Cigarettes    Quit date: 05/18/1991    Years since quitting: 31.2    Passive exposure: Never   Smokeless tobacco: Never  Vaping Use   Vaping Use: Never used  Substance Use Topics   Alcohol use: No   Drug use: No    Family History Family History  Problem Relation Age of Onset   COPD Father    Alcohol abuse Father    Diabetes Father    Cancer Sister        breast   Stroke Mother    Colon cancer Neg Hx     Allergies  No Known Allergies   Current Outpatient Medications  Medication Sig Dispense Refill   acetaminophen (TYLENOL) 500 MG tablet Take 500 mg by mouth every 6 (six) hours as needed for moderate pain.      amLODipine (NORVASC) 10 MG tablet TAKE ONE TABLET BY MOUTH DAILY 90 tablet 3   aspirin 81 MG tablet Take 81 mg by mouth daily.     atorvastatin (LIPITOR) 40 MG tablet TAKE ONE TABLET BY MOUTH DAILY 90 tablet 4   cholecalciferol (VITAMIN D3) 25 MCG (1000 UNIT) tablet Take 1,000 Units by mouth daily.     Continuous Blood Gluc Receiver (FREESTYLE LIBRE 2 READER) DEVI Use as instructed 1 each 0   Continuous Blood Gluc Sensor (FREESTYLE LIBRE 2 SENSOR) MISC CHANGE EVERY 14 DAYS AS DIRECTED 6 each 3   ezetimibe (ZETIA) 10 MG tablet TAKE ONE TABLET BY MOUTH DAILY 90 tablet 1   famotidine (PEPCID) 20 MG tablet Take 20 mg by mouth daily.     gemfibrozil (LOPID) 600 MG tablet TAKE 1 TABLET BY MOUTH DAILY AT 2:00 IN THE AFTERNOON 90 tablet 1   insulin aspart (NOVOLOG FLEXPEN) 100 UNIT/ML FlexPen Inject 15-35 Units into the skin 3 (three) times daily before meals. 45 mL 1   insulin degludec (TRESIBA FLEXTOUCH) 100 UNIT/ML FlexTouch Pen Inject 56 Units into the skin 2 (two) times daily. 30 mL 3   Insulin Pen Needle (TECHLITE PEN  NEEDLES) 31G X 5 MM MISC Use as directed with insulin. 100 each 5   lisinopril (ZESTRIL) 30 MG tablet TAKE ONE TABLET BY MOUTH DAILY 90 tablet 3   METAMUCIL FIBER PO Take 1 Dose by mouth daily. 1 dose= 1 tablespoon     metoprolol tartrate (LOPRESSOR) 50 MG tablet TAKE ONE TABLET BY MOUTH DAILY 90 tablet 3   NEEDLE, DISP, 30 G 30G X 1" MISC by Does not apply route as directed.     Semaglutide, 1 MG/DOSE, 4 MG/3ML SOPN Inject 1 mg as directed once a week. 9 mL 3   No current facility-administered medications for this visit.    ROS:   General:  No weight loss, Fever, chills  HEENT: No recent headaches, no nasal bleeding, no visual changes, no sore throat  Neurologic: No dizziness, blackouts, seizures. No recent symptoms of stroke or mini- stroke. No recent episodes of slurred speech, or temporary blindness.  Cardiac: No recent episodes of chest pain/pressure, no shortness of  breath at rest.  No shortness of breath with exertion.  Denies history of atrial fibrillation or irregular heartbeat  Vascular: No history of rest pain in feet.  No history of claudication.  No history of non-healing ulcer, No history of DVT   Pulmonary: No home oxygen, no productive cough, no hemoptysis,  No asthma or wheezing  Musculoskeletal:  [x ] Arthritis, [ ]  Low back pain,  [ ]  Joint pain  Hematologic:No history of hypercoagulable state.  No history of easy bleeding.  No history of anemia  Gastrointestinal: No hematochezia or melena,  No gastroesophageal reflux, no trouble swallowing  Urinary: [ ]  chronic Kidney disease, [ ]  on HD - [ ]  MWF or [ ]  TTHS, [ ]  Burning with urination, [ ]  Frequent urination, [ ]  Difficulty urinating;   Skin: No rashes  Psychological: No history of anxiety,  No history of depression   Physical Examination  Vitals:   08/23/22 1356 08/23/22 1400  BP: 137/64 137/67  Pulse: 78 77  Resp: 18   Temp: 98.2 F (36.8 C)   TempSrc: Temporal   SpO2: 98%   Weight: 235 lb (106.6 kg)   Height: 5\' 6"  (1.676 m)     Body mass index is 37.93 kg/m.  General:  Alert and oriented, no acute distress HEENT: Normal Neck: No bruit or JVD Pulmonary: Clear to auscultation bilaterally Cardiac: Regular Rate and Rhythm without murmur Gastrointestinal: Soft, non-tender, non-distended, no mass, no scars Skin: No rash Extremity Pulses:   radial, femoral, dorsalis pedis,  pulses bilaterally Musculoskeletal: No deformity or edema  Neurologic: Upper and lower extremity motor grossly intact and symmetric  DATA:   Right Carotid Findings:  +----------+--------+--------+--------+------------------+--------+           PSV cm/sEDV cm/sStenosisPlaque DescriptionComments  +----------+--------+--------+--------+------------------+--------+  CCA Prox  113     12                                           +----------+--------+--------+--------+------------------+--------+  CCA Distal75      11                                          +----------+--------+--------+--------+------------------+--------+  ICA Prox  96      20      1-39%  heterogenous                +----------+--------+--------+--------+------------------+--------+  ICA Mid   91      19                                          +----------+--------+--------+--------+------------------+--------+  ICA Distal96      20                                          +----------+--------+--------+--------+------------------+--------+  ECA      205                                                 +----------+--------+--------+--------+------------------+--------+   +----------+--------+-------+--------+-------------------+           PSV cm/sEDV cmsDescribeArm Pressure (mmHG)  +----------+--------+-------+--------+-------------------+  [id number]                    160                  +----------+--------+-------+--------+-------------------+   +---------+--------+--+--------+-+---------+  VertebralPSV cm/s31EDV cm/s6Antegrade  +---------+--------+--+--------+-+---------+      Left Carotid Findings:  +----------+--------+--------+--------+-------------------------+--------+           PSV cm/sEDV cm/sStenosisPlaque Description       Comments  +----------+--------+--------+--------+-------------------------+--------+  CCA Prox  113     15                                                 +----------+--------+--------+--------+-------------------------+--------+  CCA Distal128     20              calcific                           +----------+--------+--------+--------+-------------------------+--------+  ICA Prox  250     43      40-59%  heterogenous and calcific          +----------+--------+--------+--------+-------------------------+--------+  ICA  Mid   110     22                                                 +----------+--------+--------+--------+-------------------------+--------+  ICA Distal74      18                                                 +----------+--------+--------+--------+-------------------------+--------+  ECA      215                                                        +----------+--------+--------+--------+-------------------------+--------+   +----------+--------+--------+--------+-------------------+  PSV cm/sEDV cm/sDescribeArm Pressure (mmHG)  +----------+--------+--------+--------+-------------------+  Subclavian114                    160                  +----------+--------+--------+--------+-------------------+   +---------+--------+--+--------+-+---------+  VertebralPSV cm/s21EDV cm/s6Antegrade  +---------+--------+--+--------+-+---------+      Summary:  Right Carotid: Velocities in the right ICA are consistent with a 1-39%  stenosis.   Left Carotid: Velocities in the left ICA are consistent with a 40-59%  stenosis.   Vertebrals: Bilateral vertebral arteries demonstrate antegrade flow.   ASSESSMENT/PLAN:  82 y.o. male here for follow up for carotid artery disease. He is s/p right CEA by Dr. Myra Gianotti on 12/31/20. This was performed secondary to asymptomatic high grade stenosis  82 y.o. male here for follow up for carotid artery disease. He is s/p right CEA by Dr. Myra Gianotti on 12/31/20. This was performed secondary to asymptomatic high grade stenosis   The carotid duplex is unchanged.  Right ICA < 39% and the left ICA is stable at 40-59%.  He is asymptomatic for stroke or TIA.   - Continue Aspirin and statin - reviewed signs and symptoms of TIA and stroke and he understands should this occur   Mosetta Pigeon PA-C [facility] Office: [phone]  MD in clinic [place]

## 2022-08-23 NOTE — Telephone Encounter (Signed)
Patient's wife, Harriett Sine brought by [employer] along with a letter from the same that explains what information is missing on the application.  The application is in Dr. Charlean Sanfilippo folder in the front office.

## 2022-08-25 NOTE — Telephone Encounter (Signed)
Updated application faxed to [employer].

## 2022-08-26 DIAGNOSIS — K219 Gastro-esophageal reflux disease without esophagitis: Secondary | ICD-10-CM | POA: Diagnosis not present

## 2022-08-26 DIAGNOSIS — N281 Cyst of kidney, acquired: Secondary | ICD-10-CM | POA: Diagnosis not present

## 2022-08-26 DIAGNOSIS — I129 Hypertensive chronic kidney disease with stage 1 through stage 4 chronic kidney disease, or unspecified chronic kidney disease: Secondary | ICD-10-CM | POA: Diagnosis not present

## 2022-08-26 DIAGNOSIS — E559 Vitamin D deficiency, unspecified: Secondary | ICD-10-CM | POA: Diagnosis not present

## 2022-08-26 DIAGNOSIS — D631 Anemia in chronic kidney disease: Secondary | ICD-10-CM | POA: Diagnosis not present

## 2022-08-26 DIAGNOSIS — N1832 Chronic kidney disease, stage 3b: Secondary | ICD-10-CM | POA: Diagnosis not present

## 2022-08-26 DIAGNOSIS — E1122 Type 2 diabetes mellitus with diabetic chronic kidney disease: Secondary | ICD-10-CM | POA: Diagnosis not present

## 2022-08-27 LAB — LAB REPORT - SCANNED
Creatinine, POC: 176.7 mg/dL
EGFR: 25
Protein/Creatinine Ratio: 259

## 2022-08-31 NOTE — Telephone Encounter (Signed)
Application missing income and # of ppl living in household. Pt contacted and information included on application. Forms faxed to [employer].

## 2022-09-15 ENCOUNTER — Telehealth: Payer: Self-pay

## 2022-09-15 NOTE — Telephone Encounter (Signed)
Pt's wife advised patient assistance delivered and ready for pick up.  Tresiba 8 boxes Ozempic 4 boxes Novolog 10 boxes

## 2022-09-15 NOTE — Telephone Encounter (Signed)
Pt's wife picked up - Tresiba 8 boxes, Ozempic 4 boxes, &  Novolog 10 boxes - log noted

## 2022-09-16 ENCOUNTER — Other Ambulatory Visit: Payer: Self-pay | Admitting: Adult Health

## 2022-10-19 ENCOUNTER — Other Ambulatory Visit: Payer: Self-pay | Admitting: Internal Medicine

## 2022-11-19 ENCOUNTER — Encounter: Payer: Self-pay | Admitting: Internal Medicine

## 2022-11-19 ENCOUNTER — Ambulatory Visit (INDEPENDENT_AMBULATORY_CARE_PROVIDER_SITE_OTHER): Payer: PPO | Admitting: Internal Medicine

## 2022-11-19 VITALS — BP 120/80 | HR 84 | Ht 66.0 in | Wt 234.0 lb

## 2022-11-19 DIAGNOSIS — R7989 Other specified abnormal findings of blood chemistry: Secondary | ICD-10-CM

## 2022-11-19 DIAGNOSIS — E119 Type 2 diabetes mellitus without complications: Secondary | ICD-10-CM

## 2022-11-19 DIAGNOSIS — E1159 Type 2 diabetes mellitus with other circulatory complications: Secondary | ICD-10-CM | POA: Diagnosis not present

## 2022-11-19 DIAGNOSIS — Z794 Long term (current) use of insulin: Secondary | ICD-10-CM | POA: Diagnosis not present

## 2022-11-19 DIAGNOSIS — Z7985 Long-term (current) use of injectable non-insulin antidiabetic drugs: Secondary | ICD-10-CM | POA: Diagnosis not present

## 2022-11-19 DIAGNOSIS — E785 Hyperlipidemia, unspecified: Secondary | ICD-10-CM

## 2022-11-19 DIAGNOSIS — E1165 Type 2 diabetes mellitus with hyperglycemia: Secondary | ICD-10-CM

## 2022-11-19 DIAGNOSIS — E669 Obesity, unspecified: Secondary | ICD-10-CM | POA: Diagnosis not present

## 2022-11-19 LAB — HEMOGLOBIN A1C: Hemoglobin A1C: 8.1

## 2022-11-19 NOTE — Progress Notes (Signed)
Patient ID: Eric Choi, male   DOB: January 03, 1941, 82 y.o.   MRN: [id number]  HPI: Eric Choi is a 82 y.o.-year-old male, returning for f/u for DM2 dx 1990, insulin-dependent since 2005, uncontrolled, with complications (CAD, CKD, PN, DR, PVD). Last visit was 4 months ago.  Interim history: No increased urination, nausea, chest pain.  He has blurry vision-transitional lenses. He otherwise feels well and has good energy. He and his wife just celebrated their wedding anniversary-61 years.  Reviewed HbA1c levels: Lab Results  Component Value Date   HGBA1C 7.9 (A) 07/02/2022   HGBA1C 8.6 (A) 03/15/2022   HGBA1C 8.1 (A) 12/09/2021   He was previously on: Insulin Before breakfast Before lunch Before dinner Bedtime  Regular 30-35 units 15-20 units 35-38 units -  NPH 35 units -  - 45 units  - Ozempic 0.5 mg weekly (PAP)  We changed to: - Ozempic 1 mg weekly - Tresiba >> Levemir 70 units at bedtime >> 40 >> Tresiba 70 >> 56 units 2x a day - NovoLog - 15 min before a meal 35 units before each meal >> 25-35 >> 35-40 units before each meal  We tried U500 insulin but this was too expensive. He did not start Trulicity due to price - 145$.  Pt checks his sugars  more than 4 times a day with his freestyle libre 2 CGM:  Previously:  Previously:   Lowest CBG: 42 x1 >> .Marland Kitchen. 60s>> 55; he has hypoglycemia awareness in the 90s. Highest CBG: 444 >> ... 330 >> 300s >> 300s  Pt's meals are: - Breakfast: cereal + milk + eggs + bacon/sausage - Lunch: egg sandwich - Dinner: meat + veggies + starch - Snacks: 1-2: including after dinner; pears + mayonnaise, milk + crackers; cake  -+ CKD, last BUN/creatinine:  Lab Results  Component Value Date   BUN 28 (H) 06/15/2022   CREATININE 2.20 (H) 06/15/2022  10/16/2021: 30/1.97, GFR 34, glucose 203 On lisinopril.  -+ HL; last set of lipids: Lab Results  Component Value Date   CHOL 120 06/15/2022   HDL 42.20 06/15/2022   LDLCALC 53 06/15/2022    LDLDIRECT 69.0 09/05/2020   TRIG 123.0 06/15/2022   CHOLHDL 3 06/15/2022  On Lipitor 40, gemfibrozil 600, Zetia 10.    - last eye exam was on 01/28/2022: + DR reportedly. Dr. Barbaraann Choi.    - + numbness and tingling in his feet.  Latest foot exam: 06/2022.  He also has HTN. He had right carotid endarterectomy 12/31/2020. He has a history of claudication, which improved.  TSH is slightly elevated in the past but it normalized: Lab Results  Component Value Date   TSH 3.54 06/15/2022   TSH 3.22 06/12/2021   TSH 3.67 08/26/2020   TSH 4.52 (H) 06/11/2020   TSH 4.18 06/05/2019   TSH 3.61 09/08/2018   TSH 4.64 (H) 03/09/2018   TSH 2.90 03/11/2017   TSH 2.69 03/05/2016   TSH 1.62 03/12/2015   TSH 4.04 02/01/2014   TSH 2.38 10/06/2010   TSH 2.80 12/27/2008   He lost his twin brother to cancer 05/2018.  ROS: see HPI  I reviewed pt's medications, allergies, PMH, social hx, family hx, and changes were documented in the history of present illness. Otherwise, unchanged from my initial visit note.  Past Medical History:  Diagnosis Date   CAD (coronary artery disease)    Colon polyp    DIABETES MELLITUS, TYPE II 11/03/2006   Dyspnea    HYPERLIPIDEMIA 11/03/2006  HYPERTENSION 11/03/2006   Myocardial infarction [facility])    PAD (peripheral artery disease) (HCC)    Past Surgical History:  Procedure Laterality Date   ANGIOPLASTY  20 years ago   percutaneous transluminal    CATARACT EXTRACTION  2002, 2005   bilateral   ENDARTERECTOMY Right 12/31/2020   Procedure: RIGHT CAROTID ENDARTERECTOMY;  Surgeon: Nada Libman, MD;  Location: [facility] OR;  Service: Vascular;  Laterality: Right;   PATCH ANGIOPLASTY Right 12/31/2020   Procedure: PATCH ANGIOPLASTY OF RIGHT CAROTID ARTERY USING XENOSURE BOVINE PERICARDIUM PATCH;  Surgeon: Nada Libman, MD;  Location: [facility] OR;  Service: Vascular;  Laterality: Right;   Social History   Socioeconomic History   Marital status: Married    Spouse name: Not  on file   Number of children: 2   Years of education: Not on file   Highest education level: Not on file  Occupational History   Occupation: Retired-Truck driver  Tobacco Use   Smoking status: Former    Types: Cigarettes    Quit date: 05/18/1991    Years since quitting: 31.5    Passive exposure: Never   Smokeless tobacco: Never  Vaping Use   Vaping Use: Never used  Substance and Sexual Activity   Alcohol use: No   Drug use: No   Sexual activity: Not on file  Other Topics Concern   Not on file  Social History Narrative   Works 3rd shift   Regular Exercise-yes   Social Determinants of Health   Financial Resource Strain: Low Risk  (01/31/2020)   Overall Financial Resource Strain (CARDIA)    Difficulty of Paying Living Expenses: Not hard at all  Food Insecurity: No Food Insecurity (01/07/2020)   Hunger Vital Sign    Worried About Running Out of Food in the Last Year: Never true    Ran Out of Food in the Last Year: Never true  Transportation Needs: No Transportation Needs (01/31/2020)   PRAPARE - Administrator, Civil Service (Medical): No    Lack of Transportation (Non-Medical): No  Physical Activity: Not on file  Stress: Not on file  Social Connections: Not on file  Intimate Partner Violence: Not on file   Current Outpatient Medications on File Prior to Visit  Medication Sig Dispense Refill   acetaminophen (TYLENOL) 500 MG tablet Take 500 mg by mouth every 6 (six) hours as needed for moderate pain.     amLODipine (NORVASC) 10 MG tablet TAKE ONE TABLET BY MOUTH DAILY 90 tablet 3   aspirin 81 MG tablet Take 81 mg by mouth daily.     atorvastatin (LIPITOR) 40 MG tablet TAKE ONE TABLET BY MOUTH DAILY 90 tablet 4   cholecalciferol (VITAMIN D3) 25 MCG (1000 UNIT) tablet Take 1,000 Units by mouth daily.     Continuous Blood Gluc Receiver (FREESTYLE LIBRE 2 READER) DEVI Use as instructed 1 each 0   Continuous Glucose Sensor (FREESTYLE LIBRE 2 SENSOR) MISC USE AND CHANGE  EVERY 14 DAYS AS DIRECTED 6 each 2   ezetimibe (ZETIA) 10 MG tablet TAKE 1 TABLET BY MOUTH DAILY 90 tablet 1   famotidine (PEPCID) 20 MG tablet Take 20 mg by mouth daily.     gemfibrozil (LOPID) 600 MG tablet TAKE 1 TABLET BY MOUTH DAILY AT 2:00 IN THE AFTERNOON 90 tablet 1   insulin aspart (NOVOLOG FLEXPEN) 100 UNIT/ML FlexPen Inject 15-35 Units into the skin 3 (three) times daily before meals. 45 mL 1   insulin degludec (TRESIBA  FLEXTOUCH) 100 UNIT/ML FlexTouch Pen Inject 56 Units into the skin 2 (two) times daily. 30 mL 3   Insulin Pen Needle (TECHLITE PEN NEEDLES) 31G X 5 MM MISC Use as directed with insulin. 100 each 5   lisinopril (ZESTRIL) 30 MG tablet TAKE ONE TABLET BY MOUTH DAILY 90 tablet 3   METAMUCIL FIBER PO Take 1 Dose by mouth daily. 1 dose= 1 tablespoon     metoprolol tartrate (LOPRESSOR) 50 MG tablet TAKE ONE TABLET BY MOUTH DAILY 90 tablet 3   NEEDLE, DISP, 30 G 30G X 1" MISC by Does not apply route as directed.     Semaglutide, 1 MG/DOSE, 4 MG/3ML SOPN Inject 1 mg as directed once a week. 9 mL 3   No current facility-administered medications on file prior to visit.   No Known Allergies Family History  Problem Relation Age of Onset   COPD Father    Alcohol abuse Father    Diabetes Father    Cancer Sister        breast   Stroke Mother    Colon cancer Neg Hx    PE: BP 120/80   Pulse 84   Ht 5\' 6"  (1.676 m)   Wt 234 lb (106.1 kg)   SpO2 97%   BMI 37.77 kg/m    Wt Readings from Last 3 Encounters:  11/19/22 234 lb (106.1 kg)  08/23/22 235 lb (106.6 kg)  07/02/22 231 lb 6.4 oz (105 kg)   Constitutional: overweight, in NAD Eyes: no exophthalmos ENT: no masses palpated in neck, no cervical lymphadenopathy Cardiovascular: RRR, No MRG Respiratory: CTA B Musculoskeletal: no deformities Skin: moist, warm, no rashes Neurological: no tremor with outstretched hands  ASSESSMENT: 1. DM2, uncontrolled, insulin-dep, with complications - CAD - CKD - PN - DR  -  Tried to obtain U500 insulin but this was too expensive >> we had to go back to N and R insulin  2. HL  3.  Obesity class 1  4.  Elevated TSH  PLAN:  1. Patient with longstanding, uncontrolled, type 2 diabetes, on basal/bolus insulin regimen and GLP-1 receptor agonist, with improving control.  HbA1c at last visit was slightly lower, at 7.9%.  At that time, sugars were improved, occasionally increasing after meals, especially after dinner but with the vast majority of them at goal.  He had occasional lower blood sugars in the afternoon, in the 70s and also occasionally evening and night.  I advised him to decrease the Tresiba dose.  Of note, sugars improved significantly after switching from Levemir to [place]. CGM interpretation: -At today's visit, we reviewed his CGM downloads: It appears that 72% of values are in target range (goal >70%), while 27% are higher than 180 (goal <25%), and 1% are lower than 70 (goal <4%).  The calculated average blood sugar is 154.  The projected HbA1c for the next 3 months (GMI) is 7.0%. -Reviewing the CGM trends, it appears that sugars are mostly fluctuating within the target range, with occasional increases especially after breakfast but also after lunch and dinner.  He also occasionally drops his sugars under 70 between 3 PM and 6 PM.  Upon questioning, he frequently forgets to take the insulin before the meal and may take it after the meal.  We discussed that this is conducive to initially hyperglycemia postprandially followed by an abrupt drop in blood sugars and possibly subsequent hypoglycemia.  I strongly advised him to try to take his insulin 15 to 20 minutes before each  meal.  He will try to do so.  Otherwise, I do not feel that he needs any changes to his regimen. -I advised him to: Patient Instructions  Please continue: - Ozempic 1 mg weekly - NovoLog - 15-20 min before a meal: 35-40 units before each meal - Tresiba 56 units 2x a day  Please return for  another visit in 3-4 months.   - we checked his HbA1c: 8.1% (higher than before and higher than expected from his CGM) - advised to check sugars at different times of the day - 4x a day, rotating check times - advised for yearly eye exams >> he is UTD - at last visit, he has decreased circulation in his feet (known PAD), also, almost no sensation to monofilament and onychomycosis.  I strongly recommended to see a podiatrist. - return to clinic in 3-4 months  2. HL -Reviewed latest lipid panel from 05/2022: All fractions at goal: Lab Results  Component Value Date   CHOL 120 06/15/2022   HDL 42.20 06/15/2022   LDLCALC 53 06/15/2022   LDLDIRECT 69.0 09/05/2020   TRIG 123.0 06/15/2022   CHOLHDL 3 06/15/2022  -He was previously seen in the lipid clinic.  He is on Lipitor 40 mg daily, gemfibrozil 600 mg 2x daily and Zetia 10 mg daily, tolerated well.  3.  Obesity class 2 -Before the coronavirus pandemic, he was exercising at [employer], but he stopped exercising afterwards due to back pain + sciatica and also leg weakness. -He continues on Ozempic which should also help with weight loss -He lost 2 pounds before last visit  4.  Elevated TSH -He had a slightly elevated TSH in 05/2020, but TSH normalized afterwards: Lab Results  Component Value Date   TSH 3.54 06/15/2022  -no hypothyroid symptoms  Carlus Pavlov, MD PhD [facility] End[facility]

## 2022-11-19 NOTE — Patient Instructions (Addendum)
Please continue: - Ozempic 1 mg weekly - NovoLog - 15-20 min before a meal: 35-40 units before each meal - Tresiba 56 units 2x a day  Please return for another visit in 3-4 months.

## 2022-12-25 ENCOUNTER — Other Ambulatory Visit: Payer: Self-pay | Admitting: Adult Health

## 2022-12-27 ENCOUNTER — Telehealth: Payer: Self-pay

## 2022-12-27 NOTE — Telephone Encounter (Signed)
Patient Assistance  Medication: Evaristo Bury Dosage:U-200 Quantity:8 boxes  Medication: Ozempic  Dosage:1 mg Quantity: 4 boxes   Medication:Novolog  Dosage: U-100 Quantity:10 boxes   Eric Choi   *Wife has been notified its ready for pick up.*

## 2022-12-28 NOTE — Telephone Encounter (Signed)
Patient's wife, Pranit Sokol came in to office today and picked up  8 boxes of Tresiba, 4 boxes of Ozempic,  and  10 boxes of Novolog.  All of them were patient assistance.

## 2023-01-24 DIAGNOSIS — N1832 Chronic kidney disease, stage 3b: Secondary | ICD-10-CM | POA: Diagnosis not present

## 2023-01-31 ENCOUNTER — Encounter (INDEPENDENT_AMBULATORY_CARE_PROVIDER_SITE_OTHER): Payer: HMO | Admitting: Ophthalmology

## 2023-01-31 DIAGNOSIS — H35043 Retinal micro-aneurysms, unspecified, bilateral: Secondary | ICD-10-CM | POA: Diagnosis not present

## 2023-01-31 DIAGNOSIS — E113393 Type 2 diabetes mellitus with moderate nonproliferative diabetic retinopathy without macular edema, bilateral: Secondary | ICD-10-CM | POA: Diagnosis not present

## 2023-01-31 DIAGNOSIS — H43823 Vitreomacular adhesion, bilateral: Secondary | ICD-10-CM | POA: Diagnosis not present

## 2023-02-03 DIAGNOSIS — K219 Gastro-esophageal reflux disease without esophagitis: Secondary | ICD-10-CM | POA: Diagnosis not present

## 2023-02-03 DIAGNOSIS — I129 Hypertensive chronic kidney disease with stage 1 through stage 4 chronic kidney disease, or unspecified chronic kidney disease: Secondary | ICD-10-CM | POA: Diagnosis not present

## 2023-02-03 DIAGNOSIS — E559 Vitamin D deficiency, unspecified: Secondary | ICD-10-CM | POA: Diagnosis not present

## 2023-02-03 DIAGNOSIS — N281 Cyst of kidney, acquired: Secondary | ICD-10-CM | POA: Diagnosis not present

## 2023-02-03 DIAGNOSIS — D631 Anemia in chronic kidney disease: Secondary | ICD-10-CM | POA: Diagnosis not present

## 2023-02-03 DIAGNOSIS — E1122 Type 2 diabetes mellitus with diabetic chronic kidney disease: Secondary | ICD-10-CM | POA: Diagnosis not present

## 2023-02-03 DIAGNOSIS — N184 Chronic kidney disease, stage 4 (severe): Secondary | ICD-10-CM | POA: Diagnosis not present

## 2023-02-03 DIAGNOSIS — E875 Hyperkalemia: Secondary | ICD-10-CM | POA: Diagnosis not present

## 2023-02-03 LAB — LAB REPORT - SCANNED: EGFR: 28

## 2023-03-07 ENCOUNTER — Ambulatory Visit: Payer: PPO | Attending: Cardiovascular Disease | Admitting: Cardiovascular Disease

## 2023-03-07 ENCOUNTER — Encounter: Payer: Self-pay | Admitting: Cardiovascular Disease

## 2023-03-07 VITALS — BP 130/60 | HR 73 | Ht 66.0 in | Wt 240.6 lb

## 2023-03-07 DIAGNOSIS — I10 Essential (primary) hypertension: Secondary | ICD-10-CM

## 2023-03-07 DIAGNOSIS — I6523 Occlusion and stenosis of bilateral carotid arteries: Secondary | ICD-10-CM

## 2023-03-07 DIAGNOSIS — E7800 Pure hypercholesterolemia, unspecified: Secondary | ICD-10-CM

## 2023-03-07 DIAGNOSIS — I2510 Atherosclerotic heart disease of native coronary artery without angina pectoris: Secondary | ICD-10-CM

## 2023-03-07 NOTE — Progress Notes (Signed)
Chief Complaint  Patient presents with   Follow-up    CAD   History of Present Illness: 82 yo male with history of CAD, DM, CKD, HTN, former tobacco abuse, PAD, carotid artery disease and hyperlipidemia who is here today for cardiac follow up. I saw him as a new consult September 2021 for the evaluation of dyspnea on exertion. He is followed in the [facility] by Dr. Myra Gianotti for PAD. 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 He had followed in the past with Dr. Juanda Chance. At his first visit with me in September 2021 he described dyspnea on exertion for several years with no associated chest pain, LE edema, dizziness, near syncope or palpitations. Former smoker but stopped in 1993. Echo 02/06/20 with LVEF=50%, mild LVH, grade 1 diastolic dysfunction. No significant valve disease. Nuclear stress test 02/06/20 with no evidence of ischemia. He underwent right carotid endarterectomy August 2022.   He is here today for follow up. The patient denies any chest pain, dyspnea, palpitations, lower extremity edema, orthopnea, PND, dizziness, near syncope or syncope.    Primary Care Physician: Shirline Frees, NP  Past Medical History:  Diagnosis Date   CAD (coronary artery disease)    Colon polyp    DIABETES MELLITUS, TYPE II 11/03/2006   Dyspnea    HYPERLIPIDEMIA 11/03/2006   HYPERTENSION 11/03/2006   Myocardial infarction [facility])    PAD (peripheral artery disease) (HCC)     Past Surgical History:  Procedure Laterality Date   ANGIOPLASTY  20 years ago   percutaneous transluminal    CATARACT EXTRACTION  2002, 2005   bilateral   ENDARTERECTOMY Right 12/31/2020   Procedure: RIGHT CAROTID ENDARTERECTOMY;  Surgeon: Nada Libman, MD;  Location: [facility] OR;  Service: Vascular;  Laterality: Right;   PATCH ANGIOPLASTY Right 12/31/2020   Procedure: PATCH ANGIOPLASTY OF RIGHT CAROTID ARTERY USING XENOSURE BOVINE PERICARDIUM PATCH;  Surgeon: Nada Libman,  MD;  Location: [facility] OR;  Service: Vascular;  Laterality: Right;    Current Outpatient Medications  Medication Sig Dispense Refill   acetaminophen (TYLENOL) 500 MG tablet Take 500 mg by mouth every 6 (six) hours as needed for moderate pain.     amLODipine (NORVASC) 10 MG tablet TAKE ONE TABLET BY MOUTH DAILY 90 tablet 3   aspirin 81 MG tablet Take 81 mg by mouth daily.     atorvastatin (LIPITOR) 40 MG tablet TAKE ONE TABLET BY MOUTH DAILY 90 tablet 4   cholecalciferol (VITAMIN D3) 25 MCG (1000 UNIT) tablet Take 1,000 Units by mouth daily.     Continuous Blood Gluc Receiver (FREESTYLE LIBRE 2 READER) DEVI Use as instructed 1 each 0   Continuous Glucose Sensor (FREESTYLE LIBRE 2 SENSOR) MISC USE AND CHANGE EVERY 14 DAYS AS DIRECTED 6 each 2   ezetimibe (ZETIA) 10 MG tablet TAKE 1 TABLET BY MOUTH DAILY 90 tablet 1   famotidine (PEPCID) 20 MG tablet Take 20 mg by mouth daily.     gemfibrozil (LOPID) 600 MG tablet Take 300 mg by mouth daily.     insulin aspart (NOVOLOG FLEXPEN) 100 UNIT/ML FlexPen Inject 15-35 Units into the skin 3 (three) times daily before meals. 45 mL 1   insulin degludec (TRESIBA FLEXTOUCH) 100 UNIT/ML FlexTouch Pen Inject 56 Units into the skin 2 (two) times daily. (Patient taking differently: Inject 70 Units into the skin 2 (two) times daily.) 30 mL 3   Insulin Pen Needle (TECHLITE PEN  NEEDLES) 31G X 5 MM MISC Use as directed with insulin. 100 each 5   lisinopril (ZESTRIL) 20 MG tablet Take 20 mg by mouth daily.     METAMUCIL FIBER PO Take 1 Dose by mouth daily. 1 dose= 1 tablespoon     metoprolol tartrate (LOPRESSOR) 50 MG tablet TAKE ONE TABLET BY MOUTH DAILY 90 tablet 3   NEEDLE, DISP, 30 G 30G X 1" MISC by Does not apply route as directed.     Semaglutide, 1 MG/DOSE, 4 MG/3ML SOPN Inject 1 mg as directed once a week. 9 mL 3   sodium bicarbonate 650 MG tablet Take 650 mg by mouth 2 (two) times daily.     No current facility-administered medications for this visit.    No  Known Allergies  Social History   Socioeconomic History   Marital status: Married    Spouse name: Not on file   Number of children: 2   Years of education: Not on file   Highest education level: Not on file  Occupational History   Occupation: Retired-Truck driver  Tobacco Use   Smoking status: Former    Current packs/day: 0.00    Types: Cigarettes    Quit date: 05/18/1991    Years since quitting: 31.8    Passive exposure: Never   Smokeless tobacco: Never  Vaping Use   Vaping status: Never Used  Substance and Sexual Activity   Alcohol use: No   Drug use: No   Sexual activity: Not on file  Other Topics Concern   Not on file  Social History Narrative   Works 3rd shift   Regular Exercise-yes   Social Determinants of Health   Financial Resource Strain: Low Risk  (01/31/2020)   Overall Financial Resource Strain (CARDIA)    Difficulty of Paying Living Expenses: Not hard at all  Food Insecurity: No Food Insecurity (01/07/2020)   Hunger Vital Sign    Worried About Running Out of Food in the Last Year: Never true    Ran Out of Food in the Last Year: Never true  Transportation Needs: No Transportation Needs (01/31/2020)   PRAPARE - Administrator, Civil Service (Medical): No    Lack of Transportation (Non-Medical): No  Physical Activity: Not on file  Stress: Not on file  Social Connections: Not on file  Intimate Partner Violence: Not on file    Family History  Problem Relation Age of Onset   COPD Father    Alcohol abuse Father    Diabetes Father    Cancer Sister        breast   Stroke Mother    Colon cancer Neg Hx     Review of Systems:  As stated in the HPI and otherwise negative.   BP 130/60   Pulse 73   Ht 5\' 6"  (1.676 m)   Wt 109.1 kg   SpO2 96%   BMI 38.83 kg/m   Physical Examination: General: Well developed, well nourished, NAD  HEENT: OP clear, mucus membranes moist  SKIN: warm, dry. No rashes. Neuro: No focal deficits  Musculoskeletal:  Muscle strength 5/5 all ext  Psychiatric: Mood and affect normal  Neck: No JVD, no carotid bruits, no thyromegaly, no lymphadenopathy.  Lungs:Clear bilaterally, no wheezes, rhonci, crackles Cardiovascular: Regular rate and rhythm. No murmurs, gallops or rubs. Abdomen:Soft. Bowel sounds present. Non-tender.  Extremities: No lower extremity edema. Pulses are 2 + in the bilateral DP/PT.  EKG:  EKG is ordered today. The ekg ordered  today demonstrates  EKG Interpretation Date/Time:  Monday March 07 2023 08:22:11 EDT Ventricular Rate:  73 PR Interval:  202 QRS Duration:  74 QT Interval:  392 QTC Calculation: 431 R Axis:   16  Text Interpretation: Normal sinus rhythm Normal ECG No previous ECGs available Confirmed by Verne Carrow [phone]) on 03/07/2023 8:28:38 AM    Echo 02/06/20:  1. Left ventricular ejection fraction, by estimation, is 50%. The left  ventricle has mildly decreased function. The left ventricle demonstrates  regional wall motion abnormalities with mid-apical inferoseptal and  anteroseptal mild hypokinesis. There is  mild left ventricular hypertrophy. Left ventricular diastolic parameters  are consistent with Grade I diastolic dysfunction (impaired relaxation).   2. Right ventricular systolic function is normal. The right ventricular  size is normal. Tricuspid regurgitation signal is inadequate for assessing  PA pressure.   3. The mitral valve is normal in structure. No evidence of mitral valve  regurgitation. No evidence of mitral stenosis.   4. The aortic valve is tricuspid. Aortic valve regurgitation is not  visualized. Mild aortic valve sclerosis is present, with no evidence of  aortic valve stenosis.   5. IVC not visualized.   Recent Labs: 06/15/2022: ALT 17; ALT 17; BUN 28; Creatinine, Ser 2.20; Hemoglobin 14.1; Platelets 311.0; Potassium 5.3; Sodium 138; TSH 3.54   Lipid Panel    Component Value Date/Time   CHOL 120 06/15/2022 0734   TRIG 123.0  06/15/2022 0734   HDL 42.20 06/15/2022 0734   CHOLHDL 3 06/15/2022 0734   VLDL 24.6 06/15/2022 0734   LDLCALC 53 06/15/2022 0734   LDLDIRECT 69.0 09/05/2020 0800     Wt Readings from Last 3 Encounters:  03/07/23 109.1 kg  11/19/22 106.1 kg  08/23/22 106.6 kg    Assessment and Plan:   1. CAD without angina: He is known to have CAD by cath in 1994. No cath since then. Nuclear stress test in September 2021 with no ischemia. Echo September 2021 with LVEF=50%. He does not have chest pain. He has baseline dyspnea on exertion with no recent changes. Continue ASA, beta blocker and statin.   2. PAD: Followed in [facility]. 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 Right CEA August 2022.   3. HTN: BP is controlled. No changes  4. Hyperlipidemia: LDL at goal January 2024. Continue statin and Zetia.   Labs/ tests ordered today include:  Orders Placed This Encounter  Procedures   EKG 12-Lead   Disposition:   F/U with me in 12 months  Signed, Verne Carrow, MD 03/07/2023 8:46 AM    [facility] [address] [place], [place], [place]  [address] Phone: [phone]; Fax: [phone]

## 2023-03-07 NOTE — Patient Instructions (Signed)

## 2023-03-17 ENCOUNTER — Other Ambulatory Visit: Payer: Self-pay | Admitting: Adult Health

## 2023-03-23 ENCOUNTER — Ambulatory Visit (INDEPENDENT_AMBULATORY_CARE_PROVIDER_SITE_OTHER): Payer: PPO | Admitting: Internal Medicine

## 2023-03-23 ENCOUNTER — Encounter: Payer: Self-pay | Admitting: Internal Medicine

## 2023-03-23 VITALS — BP 138/84 | HR 86 | Ht 66.0 in | Wt 238.8 lb

## 2023-03-23 DIAGNOSIS — E1159 Type 2 diabetes mellitus with other circulatory complications: Secondary | ICD-10-CM

## 2023-03-23 DIAGNOSIS — R7989 Other specified abnormal findings of blood chemistry: Secondary | ICD-10-CM | POA: Diagnosis not present

## 2023-03-23 DIAGNOSIS — Z794 Long term (current) use of insulin: Secondary | ICD-10-CM

## 2023-03-23 DIAGNOSIS — E66812 Obesity, class 2: Secondary | ICD-10-CM | POA: Diagnosis not present

## 2023-03-23 DIAGNOSIS — E1165 Type 2 diabetes mellitus with hyperglycemia: Secondary | ICD-10-CM

## 2023-03-23 DIAGNOSIS — E785 Hyperlipidemia, unspecified: Secondary | ICD-10-CM | POA: Diagnosis not present

## 2023-03-23 LAB — POCT GLYCOSYLATED HEMOGLOBIN (HGB A1C): Hemoglobin A1C: 7.3 % — AB (ref 4.0–5.6)

## 2023-03-23 NOTE — Progress Notes (Signed)
Patient ID: Eric Choi, male   DOB: [birth year], 82 y.o.   MRN: [id number]  HPI: Eric Choi is a 82 y.o.-year-old male, returning for f/u for DM2 dx 1990, insulin-dependent since 2005, uncontrolled, with complications (CAD, CKD, PN, DR, PVD). Last visit was 4 months ago.  Interim history: No increased urination, nausea, chest pain.   He has blurry vision-transitional lenses.   He also has weakness in his ankles. He otherwise feels well and has good energy.  Reviewed HbA1c levels: Lab Results  Component Value Date   HGBA1C 8.1 11/19/2022   HGBA1C 7.9 (A) 07/02/2022   HGBA1C 8.6 (A) 03/15/2022   He was previously on: Insulin Before breakfast Before lunch Before dinner Bedtime  Regular 30-35 units 15-20 units 35-38 units -  NPH 35 units -  - 45 units  - Ozempic 0.5 mg weekly (PAP)  We changed to (all through PAP): - Ozempic 1 mg weekly  - Tresiba >> Levemir 70 units at bedtime >> 40 >> Tresiba 70 >> 56 >> 48-52 units 2x a day - NovoLog - 15 min before a meal 35 units before each meal >> 25-35 >> 35-40 >> 30-35 (40) units 15-20 min before each meal (may forget and take it after the meal) We tried U500 insulin but this was too expensive. He did not start Trulicity due to price - 145$.  Pt checks his sugars  more than 4 times a day with his freestyle libre 2 CGM:  Previously:  Previously:   Lowest CBG: 42 x1 >> .Marland Kitchen. 60s>> 55 >> 58; he has hypoglycemia awareness in the 90s. Highest CBG: 444 >> ... 330 >> 300s >> 300s >> 310.  Pt's meals are: - Breakfast: cereal + milk + eggs + bacon/sausage - Lunch: egg sandwich - Dinner: meat + veggies + starch - Snacks: 1-2: including after dinner; pears + mayonnaise, milk + crackers; cake  -+ CKD - sees Dr. Malen Gauze with nephrology, last BUN/creatinine:  02/03/2023: 31/2.33, GFR 28, glucose 163 Lab Results  Component Value Date   BUN 28 (H) 06/15/2022   CREATININE 2.20 (H) 06/15/2022   Lab Results  Component Value Date    MICRALBCREAT 6.8 02/01/2014   MICRALBCREAT 8.7 07/27/2013   MICRALBCREAT 5.5 06/09/2012   MICRALBCREAT 5.2 10/06/2010   MICRALBCREAT 49.5 (H) 12/27/2008   -+ HL; last set of lipids: Lab Results  Component Value Date   CHOL 120 06/15/2022   HDL 42.20 06/15/2022   LDLCALC 53 06/15/2022   LDLDIRECT 69.0 09/05/2020   TRIG 123.0 06/15/2022   CHOLHDL 3 06/15/2022  On Lipitor 40, gemfibrozil 600, Zetia 10.    - last eye exam was in 2024: + DR reportedly. Dr. Barbaraann Barthel.    - + numbness and tingling in his feet.  Latest foot exam: 06/2022.  He did not see podiatry yet but plans to establish care with [facility].  He also has HTN. He had right carotid endarterectomy 12/31/2020. He has a history of claudication, which improved.  TSH is slightly elevated in the past but it normalized: Lab Results  Component Value Date   TSH 3.54 06/15/2022   TSH 3.22 06/12/2021   TSH 3.67 08/26/2020   TSH 4.52 (H) 06/11/2020   TSH 4.18 06/05/2019   TSH 3.61 09/08/2018   TSH 4.64 (H) 03/09/2018   TSH 2.90 03/11/2017   TSH 2.69 03/05/2016   TSH 1.62 03/12/2015   TSH 4.04 02/01/2014   TSH 2.38 10/06/2010   TSH 2.80 12/27/2008  He lost his twin brother to cancer 05/2018.  ROS: see HPI  I reviewed pt's medications, allergies, PMH, social hx, family hx, and changes were documented in the history of present illness. Otherwise, unchanged from my initial visit note.  Past Medical History:  Diagnosis Date   CAD (coronary artery disease)    Colon polyp    DIABETES MELLITUS, TYPE II 11/03/2006   Dyspnea    HYPERLIPIDEMIA 11/03/2006   HYPERTENSION 11/03/2006   Myocardial infarction [facility])    PAD (peripheral artery disease) (HCC)    Past Surgical History:  Procedure Laterality Date   ANGIOPLASTY  20 years ago   percutaneous transluminal    CATARACT EXTRACTION  2002, 2005   bilateral   ENDARTERECTOMY Right 12/31/2020   Procedure: RIGHT CAROTID ENDARTERECTOMY;  Surgeon: Nada Libman, MD;   Location: [facility] OR;  Service: Vascular;  Laterality: Right;   PATCH ANGIOPLASTY Right 12/31/2020   Procedure: PATCH ANGIOPLASTY OF RIGHT CAROTID ARTERY USING XENOSURE BOVINE PERICARDIUM PATCH;  Surgeon: Nada Libman, MD;  Location: [facility] OR;  Service: Vascular;  Laterality: Right;   Social History   Socioeconomic History   Marital status: Married    Spouse name: Not on file   Number of children: 2   Years of education: Not on file   Highest education level: Not on file  Occupational History   Occupation: Retired-Truck driver  Tobacco Use   Smoking status: Former    Current packs/day: 0.00    Types: Cigarettes    Quit date: 05/18/1991    Years since quitting: 31.8    Passive exposure: Never   Smokeless tobacco: Never  Vaping Use   Vaping status: Never Used  Substance and Sexual Activity   Alcohol use: No   Drug use: No   Sexual activity: Not on file  Other Topics Concern   Not on file  Social History Narrative   Works 3rd shift   Regular Exercise-yes   Social Determinants of Health   Financial Resource Strain: Low Risk  (01/31/2020)   Overall Financial Resource Strain (CARDIA)    Difficulty of Paying Living Expenses: Not hard at all  Food Insecurity: No Food Insecurity (01/07/2020)   Hunger Vital Sign    Worried About Running Out of Food in the Last Year: Never true    Ran Out of Food in the Last Year: Never true  Transportation Needs: No Transportation Needs (01/31/2020)   PRAPARE - Administrator, Civil Service (Medical): No    Lack of Transportation (Non-Medical): No  Physical Activity: Not on file  Stress: Not on file  Social Connections: Not on file  Intimate Partner Violence: Not on file   Current Outpatient Medications on File Prior to Visit  Medication Sig Dispense Refill   acetaminophen (TYLENOL) 500 MG tablet Take 500 mg by mouth every 6 (six) hours as needed for moderate pain.     amLODipine (NORVASC) 10 MG tablet TAKE ONE TABLET BY MOUTH DAILY 90  tablet 3   aspirin 81 MG tablet Take 81 mg by mouth daily.     atorvastatin (LIPITOR) 40 MG tablet TAKE ONE TABLET BY MOUTH DAILY 90 tablet 4   cholecalciferol (VITAMIN D3) 25 MCG (1000 UNIT) tablet Take 1,000 Units by mouth daily.     Continuous Blood Gluc Receiver (FREESTYLE LIBRE 2 READER) DEVI Use as instructed 1 each 0   Continuous Glucose Sensor (FREESTYLE LIBRE 2 SENSOR) MISC USE AND CHANGE EVERY 14 DAYS AS DIRECTED 6 each  2   ezetimibe (ZETIA) 10 MG tablet TAKE 1 TABLET BY MOUTH DAILY 90 tablet 1   famotidine (PEPCID) 20 MG tablet Take 20 mg by mouth daily.     gemfibrozil (LOPID) 600 MG tablet Take 300 mg by mouth daily.     insulin aspart (NOVOLOG FLEXPEN) 100 UNIT/ML FlexPen Inject 15-35 Units into the skin 3 (three) times daily before meals. 45 mL 1   insulin degludec (TRESIBA FLEXTOUCH) 100 UNIT/ML FlexTouch Pen Inject 56 Units into the skin 2 (two) times daily. (Patient taking differently: Inject 70 Units into the skin 2 (two) times daily.) 30 mL 3   Insulin Pen Needle (TECHLITE PEN NEEDLES) 31G X 5 MM MISC Use as directed with insulin. 100 each 5   lisinopril (ZESTRIL) 20 MG tablet Take 20 mg by mouth daily.     METAMUCIL FIBER PO Take 1 Dose by mouth daily. 1 dose= 1 tablespoon     metoprolol tartrate (LOPRESSOR) 50 MG tablet TAKE ONE TABLET BY MOUTH DAILY 90 tablet 3   NEEDLE, DISP, 30 G 30G X 1" MISC by Does not apply route as directed.     Semaglutide, 1 MG/DOSE, 4 MG/3ML SOPN Inject 1 mg as directed once a week. 9 mL 3   sodium bicarbonate 650 MG tablet Take 650 mg by mouth 2 (two) times daily.     No current facility-administered medications on file prior to visit.   No Known Allergies Family History  Problem Relation Age of Onset   COPD Father    Alcohol abuse Father    Diabetes Father    Cancer Sister        breast   Stroke Mother    Colon cancer Neg Hx    PE: BP 138/84 (BP Location: Right Arm, Patient Position: Sitting, Cuff Size: Large)   Pulse 86   Ht 5'  6" (1.676 m)   Wt 238 lb 12.8 oz (108.3 kg)   SpO2 96%   BMI 38.54 kg/m    Wt Readings from Last 3 Encounters:  03/23/23 238 lb 12.8 oz (108.3 kg)  03/07/23 240 lb 9.6 oz (109.1 kg)  11/19/22 234 lb (106.1 kg)   Constitutional: overweight, in NAD Eyes: no exophthalmos ENT: no masses palpated in neck, no cervical lymphadenopathy Cardiovascular: RRR, No MRG Respiratory: CTA B Musculoskeletal: no deformities Skin:  no rashes Neurological: no tremor with outstretched hands  ASSESSMENT: 1. DM2, uncontrolled, insulin-dep, with complications - CAD - CKD - PN - DR  - Tried to obtain U500 insulin but this was too expensive >> we had to go back to N and R insulin  2. HL  3.  Obesity class 1  4.  Elevated TSH  PLAN:  1. Patient with longstanding, uncontrolled, type 2 diabetes, on basal/bolus insulin regimen and weekly GLP-1 receptor agonist, with higher HbA1c obtained at last visit, 8.1%, but out of proportion with the blood sugars at home.  The CGM glucose management indicator pointed towards an HbA1c of 7.0% at that time.  Reviewing the CGM tracings, sugars are mostly fluctuating within the target range with only occasional hyperglycemic spikes after breakfast and less frequently after lunch and dinner.  He also had some lower blood sugars under 70s between 3 PM and 6 PM.  Upon questioning, he was frequently forgetting to take the insulin before meals and was taking it after the meal.  We discussed about the importance of taking the NovoLog 15 to 20 minutes before meals.  Otherwise we did  not change the regimen. -Of note, his sugars improved significantly after switching from Levemir to [place]. CGM interpretation: -At today's visit, we reviewed his CGM downloads: It appears that 73% of values are in target range (goal >70%), while 25% are higher than 180 (goal <25%), and 2% are lower than 70 (goal <4%).  The calculated average blood sugar is 150.  The projected HbA1c for the next 3  months (GMI) is 6.9%. -Reviewing the CGM trends, sugars are mostly fluctuating within the target range but with higher blood sugars after breakfast and after lunch/dinner.  Upon questioning, he is still occasionally forgets to take his mealtime insulin 15 to 20 minutes before meals and takes it after the meal.  As a consequence, his sugars drop too low after the meal.  We discussed about taking a lower dose of NovoLog, approximately 50% of the current dose if he remembers to take it late after the meal.  Will also decrease the dose of Tresiba to further avoid the risk of hypoglycemia.  Will continue Ozempic at the same dose for now. -I advised him to: Patient Instructions  Please continue: - Ozempic 1 mg weekly - NovoLog - 15-20 min before a meal: 30-35 units (if you take it after the meal, reduce the dose to 15-20 units before the meals)  Try to reduce: - Tresiba 44 units 2x a day  Please contact [facility] for an appt. Address: [address] [place], [place], [place] [address] Phone: [phone]  Please return for another visit in 3-4 months.   - we checked his HbA1c: 7.3% (lower)  - advised to check sugars at different times of the day - 4x a day, rotating check times - advised for yearly eye exams >> he is UTD - he has decreased circulation in his feet (known PAD), also, almost no sensation to monofilament and onychomycosis.  I again strongly recommended to see a podiatrist and gave him [facility]. Info. - return to clinic in 3-4 months  2. HL -Latest lipid panel from 05/2022 was reviewed: All fractions at goal: Lab Results  Component Value Date   CHOL 120 06/15/2022   HDL 42.20 06/15/2022   LDLCALC 53 06/15/2022   LDLDIRECT 69.0 09/05/2020   TRIG 123.0 06/15/2022   CHOLHDL 3 06/15/2022  -He was previously seen in the lipid clinic.  He is currently on Lipitor 40 mg daily, gemfibrozil 600 mg twice a day and Zetia 10 mg daily-tolerated well.   3.  Obesity class 2 -Before the  coronavirus pandemic, he was exercising at [employer], but he stopped exercising afterwards due to back pain + sciatica and also leg weakness. -He continues on Ozempic which should also help with weight loss -He gained 4 pounds since last visit  4.  Elevated TSH -He had an elevated TSH in 05/2020, however, TSH normalized afterwards: Lab Results  Component Value Date   TSH 3.54 06/15/2022  -No hypothyroid symptoms -Will continue to keep an eye on his TFTs -will recheck his TSH at next visit  Carlus Pavlov, MD PhD [facility]

## 2023-03-23 NOTE — Patient Instructions (Addendum)
Please continue: - Ozempic 1 mg weekly - NovoLog - 15-20 min before a meal: 30-35 units (if you take it after the meal, reduce the dose to 15-20 units before the meals)  Try to reduce: - Tresiba 44 units 2x a day  Please contact [facility] for an appt. Address: [address] [place], [place], [place] [address] Phone: [phone]  Please return for another visit in 3-4 months.

## 2023-03-24 ENCOUNTER — Telehealth: Payer: Self-pay

## 2023-03-24 NOTE — Telephone Encounter (Signed)
Patient spouse advise that [employer] patient assistance is ready for pick up.   Tresiba  Ozempic Novolog

## 2023-03-24 NOTE — Telephone Encounter (Signed)
Patient's spouse, Kavi Almquist, came in to office today and picked up 8 boxes of Tresba, 4 boxes of Ozempic, and 10 boxes of Novolog.  All were patient assistance.

## 2023-03-31 ENCOUNTER — Ambulatory Visit: Payer: Self-pay | Admitting: Podiatry

## 2023-03-31 ENCOUNTER — Encounter: Payer: Self-pay | Admitting: Podiatry

## 2023-03-31 DIAGNOSIS — E1142 Type 2 diabetes mellitus with diabetic polyneuropathy: Secondary | ICD-10-CM

## 2023-03-31 DIAGNOSIS — B351 Tinea unguium: Secondary | ICD-10-CM | POA: Diagnosis not present

## 2023-03-31 DIAGNOSIS — I739 Peripheral vascular disease, unspecified: Secondary | ICD-10-CM

## 2023-03-31 DIAGNOSIS — Z794 Long term (current) use of insulin: Secondary | ICD-10-CM | POA: Diagnosis not present

## 2023-03-31 DIAGNOSIS — L84 Corns and callosities: Secondary | ICD-10-CM | POA: Diagnosis not present

## 2023-03-31 NOTE — Progress Notes (Signed)
Chief Complaint  Patient presents with   Foot Pain    Pain in both feet for several years now.  Feels numbness around toes. Nail trim both feet.  Getting weak in ankles. Diabetic    HPI: 82 y.o. male presents today for diabetic foot evaluation.  He does complain of painful numbness and tingling to the bilateral feet that occasionally gives him shooting sensations.  He also complains of thickened, elongated toenails that are painful with direct pressure and shoe gear that he is unable to maintain himself.  He states his last known A1c was 7.3.  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  He denies any nausea, vomiting, fever, chills, chest pain, shortness of breath.  Past Medical History:  Diagnosis Date   CAD (coronary artery disease)    Colon polyp    DIABETES MELLITUS, TYPE II 11/03/2006   Dyspnea    HYPERLIPIDEMIA 11/03/2006   HYPERTENSION 11/03/2006   Myocardial infarction [facility])    PAD (peripheral artery disease) (HCC)     Past Surgical History:  Procedure Laterality Date   ANGIOPLASTY  20 years ago   percutaneous transluminal    CATARACT EXTRACTION  2002, 2005   bilateral   ENDARTERECTOMY Right 12/31/2020   Procedure: RIGHT CAROTID ENDARTERECTOMY;  Surgeon: Nada Libman, MD;  Location: [facility] OR;  Service: Vascular;  Laterality: Right;   PATCH ANGIOPLASTY Right 12/31/2020   Procedure: PATCH ANGIOPLASTY OF RIGHT CAROTID ARTERY USING XENOSURE BOVINE PERICARDIUM PATCH;  Surgeon: Nada Libman, MD;  Location: [facility] OR;  Service: Vascular;  Laterality: Right;    No Known Allergies  ROS negative except as stated in HPI   Physical Exam: There were no vitals filed for this visit.  General: The patient is alert and oriented x3 in no acute distress.  Dermatology: Webspaces are clean and dry.  Pedal skin is atrophic and cool to touch.  Nails 1 through 5 bilaterally are thickened, elongated, dystrophic with subungual debris with yellow  discoloration suggestive of mycotic infection.  He has hyperkeratotic lesions present to the right third toe distal aspect and left hallucal IPJ.  Vascular: Faintly palpable DP pulse bilaterally, nonpalpable PT pulse bilaterally. Capillary refill within normal limits.  No appreciable edema.  Skin temperature gradient warm to cool.  Diminished pedal hair growth.  Telangiectasias appreciated.  Neurological: Vibratory sensation diminished hallucal IPJ.  Protective sensation threshold diminished bilaterally present at 3/10 sites via [facility] monofilament.  Musculoskeletal Exam: No significant pedal deformities noted   Assessment/Plan of Care: 1. Controlled type 2 diabetes mellitus with diabetic polyneuropathy, with long-term current use of insulin (HCC)   2. Onychomycosis   3. PAD (peripheral artery disease) (HCC)      No orders of the defined types were placed in this encounter.  FOR HOME USE ONLY DME DIABETIC SHOE VAS [place] ABI WITH/WO TBI  Discussed clinical findings with patient today.  # Diabetes with neuropathy -Patient states he would like to avoid oral medication for this due to the mild medications he currently takes and due to his kidney issues -His neuropathic symptoms are quite painful to the patient. -I do think he would be a good candidate for Qutenza we will see if we can get this covered for him.  He understands that his insurance will cover this right away we may need to try oral medication first. -Discussed with patient that the prescription Qutenza therapy involves multiple in office applications spaced  out 90 days each and that it may take multiple applications for him to see results.  He expressed good understanding and he is interested in this. -In the meantime I discussed the importance of daily foot checks as a preventative measure and avoiding ambulating barefoot.  Could benefit from prescription diabetic shoes as discussed below.  # PAD -Pulses were pain on  examination today.  I would like to get baseline ABI TBI studies. -Patient states he is established with [facility].  I instructed the patient to contact them regarding scheduling noninvasive vascular studies.  Copy of the order was dispensed and given to the patient today -Given the concurrent findings with neuropathy, presence of callus formation patient would benefit from diabetic shoes which I have ordered from the patient.  Patient to make an appointment to see our pedorthist and get feet scan.  # Onychomycosis associate with diabetes and callosities -Mycotic toenails x 10 were debrided in thickness and length using aseptic nail nippers and using mechanical bur to smooth nails and decreased thickness.  This was done without incident. -Concern for preulcerative callus to the right third toe, this was pared down using a 15 blade without incident.  The hallucal IPJ callus left first toe was pared down as well without incident.  Patient to schedule follow-up visit for diabetic footcare in 3 months with me.  I advised him that we will get him in sooner once we determine if he is eligible for Vassie Moselle for his neuropathy   Cori Justus L. Marchia Bond, AACFAS [facility]     [address] [place], [place] [address]                Office [phone]  Fax [phone]

## 2023-04-04 NOTE — Telephone Encounter (Signed)
Error

## 2023-04-05 NOTE — Addendum Note (Signed)
Addended by: Elveria Royals on: 04/05/2023 04:39 PM   Modules accepted: Orders

## 2023-04-08 ENCOUNTER — Ambulatory Visit (HOSPITAL_COMMUNITY)
Admission: RE | Admit: 2023-04-08 | Discharge: 2023-04-08 | Disposition: A | Discharge status: Home / Self Care | Payer: PPO | Source: Ambulatory Visit | Attending: Podiatry

## 2023-04-08 DIAGNOSIS — I739 Peripheral vascular disease, unspecified: Secondary | ICD-10-CM | POA: Diagnosis not present

## 2023-04-08 LAB — VAS US ABI WITH/WO TBI
Left ABI: 0.85
Right ABI: 0.68

## 2023-04-26 DIAGNOSIS — E11319 Type 2 diabetes mellitus with unspecified diabetic retinopathy without macular edema: Secondary | ICD-10-CM | POA: Diagnosis not present

## 2023-04-26 DIAGNOSIS — Z961 Presence of intraocular lens: Secondary | ICD-10-CM | POA: Diagnosis not present

## 2023-04-26 DIAGNOSIS — H40013 Open angle with borderline findings, low risk, bilateral: Secondary | ICD-10-CM | POA: Diagnosis not present

## 2023-04-26 LAB — HM DIABETES EYE EXAM

## 2023-05-03 DIAGNOSIS — N184 Chronic kidney disease, stage 4 (severe): Secondary | ICD-10-CM | POA: Diagnosis not present

## 2023-05-09 DIAGNOSIS — K219 Gastro-esophageal reflux disease without esophagitis: Secondary | ICD-10-CM | POA: Diagnosis not present

## 2023-05-09 DIAGNOSIS — N281 Cyst of kidney, acquired: Secondary | ICD-10-CM | POA: Diagnosis not present

## 2023-05-09 DIAGNOSIS — E559 Vitamin D deficiency, unspecified: Secondary | ICD-10-CM | POA: Diagnosis not present

## 2023-05-09 DIAGNOSIS — E875 Hyperkalemia: Secondary | ICD-10-CM | POA: Diagnosis not present

## 2023-05-09 DIAGNOSIS — D631 Anemia in chronic kidney disease: Secondary | ICD-10-CM | POA: Diagnosis not present

## 2023-05-09 DIAGNOSIS — E1122 Type 2 diabetes mellitus with diabetic chronic kidney disease: Secondary | ICD-10-CM | POA: Diagnosis not present

## 2023-05-09 DIAGNOSIS — N184 Chronic kidney disease, stage 4 (severe): Secondary | ICD-10-CM | POA: Diagnosis not present

## 2023-05-09 DIAGNOSIS — I129 Hypertensive chronic kidney disease with stage 1 through stage 4 chronic kidney disease, or unspecified chronic kidney disease: Secondary | ICD-10-CM | POA: Diagnosis not present

## 2023-05-09 DIAGNOSIS — N189 Chronic kidney disease, unspecified: Secondary | ICD-10-CM | POA: Diagnosis not present

## 2023-06-11 ENCOUNTER — Other Ambulatory Visit: Payer: Self-pay | Admitting: Adult Health

## 2023-06-11 ENCOUNTER — Other Ambulatory Visit: Payer: Self-pay | Admitting: Internal Medicine

## 2023-06-14 ENCOUNTER — Other Ambulatory Visit: Payer: Self-pay

## 2023-06-15 ENCOUNTER — Other Ambulatory Visit: Payer: Self-pay

## 2023-06-15 MED ORDER — FREESTYLE LIBRE 2 SENSOR MISC: 3 refills | Status: AC

## 2023-06-15 NOTE — Telephone Encounter (Signed)
Requested Prescriptions   Signed Prescriptions Disp Refills   Continuous Glucose Sensor (FREESTYLE LIBRE 2 SENSOR) MISC 6 each 3    Sig: USE AND CHANGE EVERY 14 DAYS AS DIRECTED    Authorizing Provider: Carlus Pavlov    Ordering User: Pollie Meyer

## 2023-06-17 ENCOUNTER — Other Ambulatory Visit (HOSPITAL_COMMUNITY): Payer: Self-pay

## 2023-06-17 ENCOUNTER — Ambulatory Visit (INDEPENDENT_AMBULATORY_CARE_PROVIDER_SITE_OTHER): Payer: PPO | Admitting: Adult Health

## 2023-06-17 ENCOUNTER — Other Ambulatory Visit: Payer: Self-pay | Admitting: Adult Health

## 2023-06-17 ENCOUNTER — Encounter: Payer: Self-pay | Admitting: Adult Health

## 2023-06-17 VITALS — BP 138/70 | HR 83 | Temp 97.5°F | Ht 68.0 in | Wt 237.0 lb

## 2023-06-17 DIAGNOSIS — I2510 Atherosclerotic heart disease of native coronary artery without angina pectoris: Secondary | ICD-10-CM

## 2023-06-17 DIAGNOSIS — R351 Nocturia: Secondary | ICD-10-CM | POA: Diagnosis not present

## 2023-06-17 DIAGNOSIS — E785 Hyperlipidemia, unspecified: Secondary | ICD-10-CM

## 2023-06-17 DIAGNOSIS — Z0000 Encounter for general adult medical examination without abnormal findings: Secondary | ICD-10-CM

## 2023-06-17 DIAGNOSIS — I10 Essential (primary) hypertension: Secondary | ICD-10-CM | POA: Diagnosis not present

## 2023-06-17 DIAGNOSIS — E782 Mixed hyperlipidemia: Secondary | ICD-10-CM | POA: Diagnosis not present

## 2023-06-17 DIAGNOSIS — N401 Enlarged prostate with lower urinary tract symptoms: Secondary | ICD-10-CM | POA: Diagnosis not present

## 2023-06-17 DIAGNOSIS — I739 Peripheral vascular disease, unspecified: Secondary | ICD-10-CM

## 2023-06-17 DIAGNOSIS — Z7985 Long-term (current) use of injectable non-insulin antidiabetic drugs: Secondary | ICD-10-CM

## 2023-06-17 DIAGNOSIS — Z794 Long term (current) use of insulin: Secondary | ICD-10-CM | POA: Diagnosis not present

## 2023-06-17 DIAGNOSIS — N184 Chronic kidney disease, stage 4 (severe): Secondary | ICD-10-CM | POA: Diagnosis not present

## 2023-06-17 LAB — LIPID PANEL
Cholesterol: 98 mg/dL (ref 0–200)
HDL: 32.1 mg/dL — ABNORMAL LOW (ref >39.00)
LDL Cholesterol: 28 mg/dL (ref 0–99)
NonHDL: 66.02
Total CHOL/HDL Ratio: 3
Triglycerides: 191 mg/dL — ABNORMAL HIGH (ref 0.0–149.0)
VLDL: 38.2 mg/dL (ref 0.0–40.0)

## 2023-06-17 LAB — COMPREHENSIVE METABOLIC PANEL
ALT: 23 U/L (ref 0–53)
AST: 16 U/L (ref 0–37)
Albumin: 4.3 g/dL (ref 3.5–5.2)
Alkaline Phosphatase: 63 U/L (ref 39–117)
BUN: 28 mg/dL — ABNORMAL HIGH (ref 6–23)
CO2: 25 meq/L (ref 19–32)
Calcium: 9.5 mg/dL (ref 8.4–10.5)
Chloride: 104 meq/L (ref 96–112)
Creatinine, Ser: 2.24 mg/dL — ABNORMAL HIGH (ref 0.40–1.50)
GFR: 26.91 mL/min — ABNORMAL LOW (ref >60.00)
Glucose, Bld: 132 mg/dL — ABNORMAL HIGH (ref 70–99)
Potassium: 5.1 meq/L (ref 3.5–5.1)
Sodium: 140 meq/L (ref 135–145)
Total Bilirubin: 0.4 mg/dL (ref 0.2–1.2)
Total Protein: 7.2 g/dL (ref 6.0–8.3)

## 2023-06-17 LAB — CBC
HCT: 42.9 % (ref 39.0–52.0)
Hemoglobin: 13.9 g/dL (ref 13.0–17.0)
MCHC: 32.5 g/dL (ref 30.0–36.0)
MCV: 90 fL (ref 78.0–100.0)
Platelets: 317 10*3/uL (ref 150.0–400.0)
RBC: 4.76 Mil/uL (ref 4.22–5.81)
RDW: 15.6 % — ABNORMAL HIGH (ref 11.5–15.5)
WBC: 10.9 10*3/uL — ABNORMAL HIGH (ref 4.0–10.5)

## 2023-06-17 LAB — TSH: TSH: 3.71 u[IU]/mL (ref 0.35–5.50)

## 2023-06-17 NOTE — Patient Instructions (Signed)
It was great seeing you today   We will follow up with you regarding your lab work   Please let me know if you need anything   Please get the shingles and tetanus shot at the pharmacy

## 2023-06-17 NOTE — Progress Notes (Signed)
Subjective:    Patient ID: Eric Choi, male    DOB: 04/17/1941, 83 y.o.   MRN: [id number]  HPI Patient presents for yearly preventative medicine examination. He is a pleasant 83 year old male who  has a past medical history of CAD (coronary artery disease), Colon polyp, DIABETES MELLITUS, TYPE II (11/03/2006), Dyspnea, HYPERLIPIDEMIA (11/03/2006), HYPERTENSION (11/03/2006), Myocardial infarction (HCC), and PAD (peripheral artery disease) (HCC).  Hypertension-is currently prescribed Norvasc 10 mg, lisinopril 30 mg and metoprolol 50 mg.  He denies dizziness, lightheadedness, chest pain, shortness of breath, or syncopal episodes.  He does monitor his blood pressures at home and reports readings between 120 and 130 over 70s to 80s. BP Readings from Last 3 Encounters:  06/17/23 138/70  03/23/23 138/84  03/07/23 130/60   Hyperlipidemia-managed with Lipitor 40,  Zetia10 mg and Lopid 600 mg daily. He denies myalgia or fatigue Lab Results  Component Value Date   CHOL 120 06/15/2022   HDL 42.20 06/15/2022   LDLCALC 53 06/15/2022   LDLDIRECT 69.0 09/05/2020   TRIG 123.0 06/15/2022   CHOLHDL 3 06/15/2022    Diabetes mellitus-managed by endocrinology.  Currently prescribed Ozempic 1.0mg  weekly Novolog 30-35 units TID - 15-20 min before meals, and Tresiba 44 units BID. He does use a continuous glucose monitor.  Reports that his sugars have up and down  Lab Results  Component Value Date   HGBA1C 7.3 (A) 03/23/2023    CAD-followed by cardiology.  Had a cath in 1994 but no cath since then.  Nuclear stress in 2021 showed no ischemia.  Echo in September 2021 with LVEF equal to 50%.  Does continue to have mild dyspnea that is unchanged over the past few years.  PAD-followed by vein and vascular.  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  He is on  lisinopril and also on Ozempic.  He knows to avoid NSAIDs   All immunizations and health maintenance protocols were reviewed with the patient and needed orders were placed.  Appropriate screening laboratory values were ordered for the patient including screening of hyperlipidemia, renal function and hepatic function.  Medication reconciliation,  past medical history, social history, problem list and allergies were reviewed in detail with the patient  Goals were established with regard to weight loss, exercise, and  diet in compliance with medications Wt Readings from Last 3 Encounters:  06/17/23 237 lb (107.5 kg)  03/23/23 238 lb 12.8 oz (108.3 kg)  03/07/23 240 lb 9.6 oz (109.1 kg)    Review of Systems  Constitutional: Negative.   HENT:  Positive for hearing loss (chronic).   Eyes: Negative.   Respiratory: Negative.    Cardiovascular: Negative.   Gastrointestinal: Negative.   Endocrine: Negative.   Genitourinary: Negative.   Musculoskeletal:  Positive for arthralgias.  Skin: Negative.   Allergic/Immunologic: Negative.   Neurological:  Positive for numbness.  Hematological: Negative.   Psychiatric/Behavioral: Negative.    All other systems reviewed and are negative.  Past Medical History:  Diagnosis Date   CAD (coronary artery disease)    Colon polyp    DIABETES MELLITUS, TYPE II 11/03/2006   Dyspnea    HYPERLIPIDEMIA 11/03/2006   HYPERTENSION 11/03/2006   Myocardial infarction [facility]    PAD (peripheral artery disease) (HCC)     Social History   Socioeconomic History  Marital status: Married    Spouse name: Not on file   Number of children: 2   Years of education: Not on file   Highest education level: Not on file  Occupational History   Occupation: Retired-Truck driver  Tobacco Use   Smoking status: Former    Current packs/day: 0.00    Types: Cigarettes    Quit date: 05/18/1991    Years since quitting: 32.1    Passive exposure: Never   Smokeless tobacco:  Never  Vaping Use   Vaping status: Never Used  Substance and Sexual Activity   Alcohol use: No   Drug use: No   Sexual activity: Not on file  Other Topics Concern   Not on file  Social History Narrative   Works 3rd shift   Regular Exercise-yes   Social Drivers of Corporate investment banker Strain: Low Risk  (01/31/2020)   Overall Financial Resource Strain (CARDIA)    Difficulty of Paying Living Expenses: Not hard at all  Food Insecurity: No Food Insecurity (01/07/2020)   Hunger Vital Sign    Worried About Running Out of Food in the Last Year: Never true    Ran Out of Food in the Last Year: Never true  Transportation Needs: No Transportation Needs (01/31/2020)   PRAPARE - Administrator, Civil Service (Medical): No    Lack of Transportation (Non-Medical): No  Physical Activity: Not on file  Stress: Not on file  Social Connections: Not on file  Intimate Partner Violence: Not on file    Past Surgical History:  Procedure Laterality Date   ANGIOPLASTY  20 years ago   percutaneous transluminal    CATARACT EXTRACTION  2002, 2005   bilateral   ENDARTERECTOMY Right 12/31/2020   Procedure: RIGHT CAROTID ENDARTERECTOMY;  Surgeon: Nada Libman, MD;  Location: [facility] OR;  Service: Vascular;  Laterality: Right;   PATCH ANGIOPLASTY Right 12/31/2020   Procedure: PATCH ANGIOPLASTY OF RIGHT CAROTID ARTERY USING XENOSURE BOVINE PERICARDIUM PATCH;  Surgeon: Nada Libman, MD;  Location: [facility] OR;  Service: Vascular;  Laterality: Right;    Family History  Problem Relation Age of Onset   COPD Father    Alcohol abuse Father    Diabetes Father    Cancer Sister        breast   Stroke Mother    Colon cancer Neg Hx     No Known Allergies  Current Outpatient Medications on File Prior to Visit  Medication Sig Dispense Refill   acetaminophen (TYLENOL) 500 MG tablet Take 500 mg by mouth every 6 (six) hours as needed for moderate pain.     amLODipine (NORVASC) 10 MG tablet TAKE 1  TABLET BY MOUTH DAILY 90 tablet 3   aspirin 81 MG tablet Take 81 mg by mouth daily.     atorvastatin (LIPITOR) 40 MG tablet TAKE ONE TABLET BY MOUTH DAILY 90 tablet 4   cholecalciferol (VITAMIN D3) 25 MCG (1000 UNIT) tablet Take 1,000 Units by mouth daily.     Continuous Blood Gluc Receiver (FREESTYLE LIBRE 2 READER) DEVI Use as instructed 1 each 0   Continuous Glucose Sensor (FREESTYLE LIBRE 2 SENSOR) MISC USE AND CHANGE EVERY 14 DAYS AS DIRECTED 6 each 3   ezetimibe (ZETIA) 10 MG tablet TAKE 1 TABLET BY MOUTH DAILY 90 tablet 1   famotidine (PEPCID) 20 MG tablet Take 20 mg by mouth daily.     gemfibrozil (LOPID) 600 MG tablet Take 300 mg by mouth daily.  insulin aspart (NOVOLOG FLEXPEN) 100 UNIT/ML FlexPen Inject 15-35 Units into the skin 3 (three) times daily before meals. 45 mL 1   insulin degludec (TRESIBA FLEXTOUCH) 100 UNIT/ML FlexTouch Pen Inject 56 Units into the skin 2 (two) times daily. (Patient taking differently: Inject 70 Units into the skin 2 (two) times daily.) 30 mL 3   Insulin Pen Needle (TECHLITE PEN NEEDLES) 31G X 5 MM MISC Use as directed with insulin. 100 each 5   lisinopril (ZESTRIL) 20 MG tablet Take 20 mg by mouth daily.     METAMUCIL FIBER PO Take 1 Dose by mouth daily. 1 dose= 1 tablespoon     metoprolol tartrate (LOPRESSOR) 50 MG tablet TAKE ONE TABLET BY MOUTH DAILY 90 tablet 3   NEEDLE, DISP, 30 G 30G X 1" MISC by Does not apply route as directed.     Semaglutide, 1 MG/DOSE, 4 MG/3ML SOPN Inject 1 mg as directed once a week. 9 mL 3   sodium bicarbonate 650 MG tablet Take 650 mg by mouth 2 (two) times daily.     No current facility-administered medications on file prior to visit.    BP 138/70   Pulse 83   Temp (!) 97.5 F (36.4 C) (Oral)   Ht 5\' 8"  (1.727 m)   Wt 237 lb (107.5 kg)   SpO2 97%   BMI 36.04 kg/m       Objective:   Physical Exam Vitals and nursing note reviewed.  Constitutional:      General: He is not in acute distress.    Appearance:  Normal appearance. He is obese. He is not ill-appearing.  HENT:     Head: Normocephalic and atraumatic.     Right Ear: Tympanic membrane, ear canal and external ear normal. There is no impacted cerumen.     Left Ear: Tympanic membrane, ear canal and external ear normal. There is no impacted cerumen.     Nose: Nose normal. No congestion or rhinorrhea.     Mouth/Throat:     Mouth: Mucous membranes are moist.     Dentition: Abnormal dentition. Has dentures.     Pharynx: Oropharynx is clear.  Eyes:     Extraocular Movements: Extraocular movements intact.     Conjunctiva/sclera: Conjunctivae normal.     Pupils: Pupils are equal, round, and reactive to light.  Neck:     Vascular: No carotid bruit.  Cardiovascular:     Rate and Rhythm: Normal rate and regular rhythm.     Pulses: Normal pulses.     Heart sounds: No murmur heard.    No friction rub. No gallop.  Pulmonary:     Effort: Pulmonary effort is normal.     Breath sounds: Normal breath sounds.  Abdominal:     General: Abdomen is flat. Bowel sounds are normal. There is no distension.     Palpations: Abdomen is soft. There is no mass.     Tenderness: There is no abdominal tenderness. There is no guarding or rebound.     Hernia: No hernia is present.  Musculoskeletal:        General: Normal range of motion.     Cervical back: Normal range of motion and neck supple.  Lymphadenopathy:     Cervical: No cervical adenopathy.  Skin:    General: Skin is warm and dry.     Capillary Refill: Capillary refill takes less than 2 seconds.  Neurological:     General: No focal deficit present.  Mental Status: He is alert and oriented to person, place, and time.  Psychiatric:        Mood and Affect: Mood normal.        Behavior: Behavior normal.        Thought Content: Thought content normal.        Judgment: Judgment normal.       Assessment & Plan:  1. Routine general medical examination at a health care facility (Primary) Today  patient counseled on age appropriate routine health concerns for screening and prevention, each reviewed and up to date or declined. Immunizations reviewed and up to date or declined. Labs ordered and reviewed. Risk factors for depression reviewed and negative. Hearing function and visual acuity are intact. ADLs screened and addressed as needed. Functional ability and level of safety reviewed and appropriate. Education, counseling and referrals performed based on assessed risks today. Patient provided with a copy of personalized plan for preventive services. - needs to lose weight though diet and exercise - get Tdap and Singles vaccination at pharmacy  - Follow up in one year or sooner if needed  2. Essential hypertension - Controlled. No change in medication  - Lipid panel; Future - TSH; Future - CBC; Future - Comprehensive metabolic panel; Future  3. Mixed hyperlipidemia - Consider increase in statin. Continue Zetia - Lipid panel; Future - TSH; Future - CBC; Future - Comprehensive metabolic panel; Future  4. Current use of insulin (HCC) - Per endocrinology  - Lipid panel; Future - TSH; Future - CBC; Future - Comprehensive metabolic panel; Future  5. Long-term current use of injectable noninsulin antidiabetic medication - Per endocrinology  - Lipid panel; Future - TSH; Future - CBC; Future - Comprehensive metabolic panel; Future  6. Coronary artery disease involving native coronary artery of native heart without angina pectoris - Continue with statin and zetia.  - Lipid panel; Future - TSH; Future - CBC; Future - Comprehensive metabolic panel; Future  7. PAD (peripheral artery disease) (HCC) - Per vein and vascular  - Lipid panel; Future - TSH; Future - CBC; Future - Comprehensive metabolic panel; Future  8. BPH associated with nocturia - asymptomatic   9. Chronic kidney disease, stage 4 (severe) (HCC) - Avoid nephrotoxic agents and eat a low sodium diet.  - Lipid  panel; Future - TSH; Future - CBC; Future - Comprehensive metabolic panel; Future  Shirline Frees, NP

## 2023-06-25 ENCOUNTER — Other Ambulatory Visit: Payer: Self-pay | Admitting: Adult Health

## 2023-07-01 ENCOUNTER — Ambulatory Visit: Payer: PPO | Admitting: Podiatry

## 2023-07-04 ENCOUNTER — Encounter: Payer: Self-pay | Admitting: Podiatry

## 2023-07-04 ENCOUNTER — Ambulatory Visit (INDEPENDENT_AMBULATORY_CARE_PROVIDER_SITE_OTHER): Payer: PPO | Admitting: Podiatry

## 2023-07-04 DIAGNOSIS — Z794 Long term (current) use of insulin: Secondary | ICD-10-CM | POA: Diagnosis not present

## 2023-07-04 DIAGNOSIS — B351 Tinea unguium: Secondary | ICD-10-CM | POA: Diagnosis not present

## 2023-07-04 DIAGNOSIS — E1142 Type 2 diabetes mellitus with diabetic polyneuropathy: Secondary | ICD-10-CM | POA: Diagnosis not present

## 2023-07-04 NOTE — Progress Notes (Signed)
 This patient returns to my office for at risk foot care.  This patient requires this care by a professional since this patient will be at risk due to having diabetes  This patient is unable to cut nails himself since the patient cannot reach his nails.These nails are painful walking and wearing shoes.  This patient presents for at risk foot care today.  General Appearance  Alert, conversant and in no acute stress.  Vascular  Dorsalis pedis and posterior tibial  pulses are weakly  palpable  bilaterally.  Capillary return is within normal limits  bilaterally. Temperature is within normal limits  bilaterally.  Neurologic  Senn-Weinstein monofilament wire test diminished  bilaterally. Muscle power within normal limits bilaterally.  Nails Thick disfigured discolored nails with subungual debris  from hallux to fifth toes bilaterally. No evidence of bacterial infection or drainage bilaterally.  Orthopedic  No limitations of motion  feet .  No crepitus or effusions noted.  No bony pathology or digital deformities noted.  Skin  normotropic skin with no porokeratosis noted bilaterally.  No signs of infections or ulcers noted.  Hemorrhagic clavi second toe right foot.   Onychomycosis  Pain in right toes  Pain in left toes  Consent was obtained for treatment procedures.   Mechanical debridement of nails 1-5  bilaterally performed with a nail nipper.  Filed with dremel without incident.    Return office visit    3 months                  Told patient to return for periodic foot care and evaluation due to potential at risk complications.   Helane Gunther DPM

## 2023-07-05 ENCOUNTER — Other Ambulatory Visit: Payer: Self-pay | Admitting: Adult Health

## 2023-07-05 MED ORDER — ATORVASTATIN CALCIUM 40 MG PO TABS: 40.0000 mg | ORAL_TABLET | Freq: Every day | ORAL | 3 refills | Status: AC

## 2023-07-25 DIAGNOSIS — N184 Chronic kidney disease, stage 4 (severe): Secondary | ICD-10-CM | POA: Diagnosis not present

## 2023-08-04 ENCOUNTER — Encounter: Payer: Self-pay | Admitting: Internal Medicine

## 2023-08-04 ENCOUNTER — Ambulatory Visit (INDEPENDENT_AMBULATORY_CARE_PROVIDER_SITE_OTHER): Payer: PPO | Admitting: Internal Medicine

## 2023-08-04 VITALS — BP 120/70 | HR 82 | Ht 68.0 in | Wt 235.4 lb

## 2023-08-04 DIAGNOSIS — E66812 Obesity, class 2: Secondary | ICD-10-CM

## 2023-08-04 DIAGNOSIS — Z7985 Long-term (current) use of injectable non-insulin antidiabetic drugs: Secondary | ICD-10-CM

## 2023-08-04 DIAGNOSIS — Z794 Long term (current) use of insulin: Secondary | ICD-10-CM

## 2023-08-04 DIAGNOSIS — E1165 Type 2 diabetes mellitus with hyperglycemia: Secondary | ICD-10-CM

## 2023-08-04 DIAGNOSIS — E1159 Type 2 diabetes mellitus with other circulatory complications: Secondary | ICD-10-CM | POA: Diagnosis not present

## 2023-08-04 DIAGNOSIS — R7989 Other specified abnormal findings of blood chemistry: Secondary | ICD-10-CM

## 2023-08-04 LAB — POCT GLYCOSYLATED HEMOGLOBIN (HGB A1C): Hemoglobin A1C: 7.4 % — AB (ref 4.0–5.6)

## 2023-08-04 NOTE — Progress Notes (Signed)
 Patient ID: Eric Choi, male   DOB: [birth year], 83 y.o.   MRN: [id number]  HPI: Eric Choi is a 83 y.o.-year-old male, returning for f/u for DM2 dx 1990, insulin-dependent since 2005, uncontrolled, with complications (CAD, CKD, PN, DR, PVD). Last visit was 4 months ago.  Interim history: No increased urination, nausea, chest pain.   He has blurry vision-transitional lenses.   He otherwise feels well and has good energy.  Reviewed HbA1c levels: Lab Results  Component Value Date   HGBA1C 7.3 (A) 03/23/2023   HGBA1C 8.1 11/19/2022   HGBA1C 7.9 (A) 07/02/2022   He was previously on: Insulin Before breakfast Before lunch Before dinner Bedtime  Regular 30-35 units 15-20 units 35-38 units -  NPH 35 units -  - 45 units  - Ozempic 0.5 mg weekly (PAP)  We changed to (all through PAP): - Ozempic 1 mg weekly  - Tresiba >> Levemir 70 units at bedtime >> 40 >> Tresiba 70 >> 56 >> 48-52 >> 44 units 2x a day - NovoLog - 15 min before a meal 35 units before each meal >> 25-35 >> 35-40 >> 30-35 (40) units 15-20 min before each meal (may forget and take it after the meal) We tried U500 insulin but this was too expensive. He did not start Trulicity due to price - 145$.  Pt checks his sugars  more than 4 times a day with his freestyle libre 2 CGM:  Previously:  Previously:  Lowest CBG: 42 x1 >> ... 55 >> 58 >> 55; he has hypoglycemia awareness in the 90s. Highest CBG: 444 >> ...  310 >> 369.  Pt's meals are: - Breakfast: cereal + milk + eggs + bacon/sausage - Lunch: egg sandwich - Dinner: meat + veggies + starch - Snacks: 1-2: including after dinner; pears + mayonnaise, milk + crackers; cake  -+ CKD - sees Dr. Malen Gauze with nephrology, last BUN/creatinine:  Lab Results  Component Value Date   BUN 28 (H) 06/17/2023   CREATININE 2.24 (H) 06/17/2023  02/03/2023: 31/2.33, GFR 28, glucose 163  Lab Results  Component Value Date   MICRALBCREAT 6.8 02/01/2014   MICRALBCREAT 8.7  07/27/2013   MICRALBCREAT 5.5 06/09/2012   MICRALBCREAT 5.2 10/06/2010   MICRALBCREAT 49.5 (H) 12/27/2008   -+ HL; last set of lipids: Lab Results  Component Value Date   CHOL 98 06/17/2023   HDL 32.10 (L) 06/17/2023   LDLCALC 28 06/17/2023   LDLDIRECT 69.0 09/05/2020   TRIG 191.0 (H) 06/17/2023   CHOLHDL 3 06/17/2023  On Lipitor 40, gemfibrozil 600 bid, Zetia 10.    - last eye exam was in 2024: + DR reportedly. Dr. Barbaraann Barthel.    - + numbness and tingling in his feet.  Latest foot exam: 07/04/2023 by Dr. Stacie Acres.  He also has HTN. He had right carotid endarterectomy 12/31/2020. He has a history of claudication, which improved.  TSH is slightly elevated in the past but it normalized: Lab Results  Component Value Date   TSH 3.71 06/17/2023   TSH 3.54 06/15/2022   TSH 3.22 06/12/2021   TSH 3.67 08/26/2020   TSH 4.52 (H) 06/11/2020   TSH 4.18 06/05/2019   TSH 3.61 09/08/2018   TSH 4.64 (H) 03/09/2018   TSH 2.90 03/11/2017   TSH 2.69 03/05/2016   TSH 1.62 03/12/2015   TSH 4.04 02/01/2014   TSH 2.38 10/06/2010   TSH 2.80 12/27/2008   He lost his twin brother to cancer 05/2018.  ROS: see HPI  I reviewed pt's medications, allergies, PMH, social hx, family hx, and changes were documented in the history of present illness. Otherwise, unchanged from my initial visit note.  Past Medical History:  Diagnosis Date   CAD (coronary artery disease)    Colon polyp    DIABETES MELLITUS, TYPE II 11/03/2006   Dyspnea    HYPERLIPIDEMIA 11/03/2006   HYPERTENSION 11/03/2006   Myocardial infarction [facility]    PAD (peripheral artery disease) (HCC)    Past Surgical History:  Procedure Laterality Date   ANGIOPLASTY  20 years ago   percutaneous transluminal    CATARACT EXTRACTION  2002, 2005   bilateral   ENDARTERECTOMY Right 12/31/2020   Procedure: RIGHT CAROTID ENDARTERECTOMY;  Surgeon: Nada Libman, MD;  Location: [facility] OR;  Service: Vascular;  Laterality: Right;   PATCH ANGIOPLASTY  Right 12/31/2020   Procedure: PATCH ANGIOPLASTY OF RIGHT CAROTID ARTERY USING XENOSURE BOVINE PERICARDIUM PATCH;  Surgeon: Nada Libman, MD;  Location: [facility] OR;  Service: Vascular;  Laterality: Right;   Social History   Socioeconomic History   Marital status: Married    Spouse name: Not on file   Number of children: 2   Years of education: Not on file   Highest education level: Not on file  Occupational History   Occupation: Retired-Truck driver  Tobacco Use   Smoking status: Former    Current packs/day: 0.00    Types: Cigarettes    Quit date: 05/18/1991    Years since quitting: 32.2    Passive exposure: Never   Smokeless tobacco: Never  Vaping Use   Vaping status: Never Used  Substance and Sexual Activity   Alcohol use: No   Drug use: No   Sexual activity: Not on file  Other Topics Concern   Not on file  Social History Narrative   Works 3rd shift   Regular Exercise-yes   Social Drivers of Corporate investment banker Strain: Low Risk  (01/31/2020)   Overall Financial Resource Strain (CARDIA)    Difficulty of Paying Living Expenses: Not hard at all  Food Insecurity: No Food Insecurity (01/07/2020)   Hunger Vital Sign    Worried About Running Out of Food in the Last Year: Never true    Ran Out of Food in the Last Year: Never true  Transportation Needs: No Transportation Needs (01/31/2020)   PRAPARE - Administrator, Civil Service (Medical): No    Lack of Transportation (Non-Medical): No  Physical Activity: Not on file  Stress: Not on file  Social Connections: Not on file  Intimate Partner Violence: Not on file   Current Outpatient Medications on File Prior to Visit  Medication Sig Dispense Refill   acetaminophen (TYLENOL) 500 MG tablet Take 500 mg by mouth every 6 (six) hours as needed for moderate pain.     amLODipine (NORVASC) 10 MG tablet TAKE 1 TABLET BY MOUTH DAILY 90 tablet 3   aspirin 81 MG tablet Take 81 mg by mouth daily.     atorvastatin  (LIPITOR) 40 MG tablet Take 1 tablet (40 mg total) by mouth daily. 90 tablet 3   cholecalciferol (VITAMIN D3) 25 MCG (1000 UNIT) tablet Take 1,000 Units by mouth daily.     Continuous Blood Gluc Receiver (FREESTYLE LIBRE 2 READER) DEVI Use as instructed 1 each 0   Continuous Glucose Sensor (FREESTYLE LIBRE 2 SENSOR) MISC USE AND CHANGE EVERY 14 DAYS AS DIRECTED 6 each 3   ezetimibe (ZETIA) 10 MG tablet TAKE 1  TABLET BY MOUTH DAILY 90 tablet 1   famotidine (PEPCID) 20 MG tablet Take 20 mg by mouth daily.     gemfibrozil (LOPID) 600 MG tablet TAKE 1 TABLET BY MOUTH DAILY AT 2:00 P.M. 90 tablet 0   insulin aspart (NOVOLOG FLEXPEN) 100 UNIT/ML FlexPen Inject 15-35 Units into the skin 3 (three) times daily before meals. 45 mL 1   insulin degludec (TRESIBA FLEXTOUCH) 100 UNIT/ML FlexTouch Pen Inject 56 Units into the skin 2 (two) times daily. (Patient taking differently: Inject 70 Units into the skin 2 (two) times daily.) 30 mL 3   Insulin Pen Needle (TECHLITE PEN NEEDLES) 31G X 5 MM MISC Use as directed with insulin. 100 each 5   lisinopril (ZESTRIL) 20 MG tablet Take 20 mg by mouth daily.     METAMUCIL FIBER PO Take 1 Dose by mouth daily. 1 dose= 1 tablespoon     metoprolol tartrate (LOPRESSOR) 50 MG tablet TAKE 1 TABLET BY MOUTH DAILY 90 tablet 3   NEEDLE, DISP, 30 G 30G X 1" MISC by Does not apply route as directed.     Semaglutide, 1 MG/DOSE, 4 MG/3ML SOPN Inject 1 mg as directed once a week. 9 mL 3   sodium bicarbonate 650 MG tablet Take 650 mg by mouth 2 (two) times daily.     No current facility-administered medications on file prior to visit.   No Known Allergies Family History  Problem Relation Age of Onset   COPD Father    Alcohol abuse Father    Diabetes Father    Cancer Sister        breast   Stroke Mother    Colon cancer Neg Hx    PE: BP 120/70   Pulse 82   Ht 5\' 8"  (1.727 m)   Wt 235 lb 6.4 oz (106.8 kg)   SpO2 95%   BMI 35.79 kg/m    Wt Readings from Last 3  Encounters:  08/04/23 235 lb 6.4 oz (106.8 kg)  06/17/23 237 lb (107.5 kg)  03/23/23 238 lb 12.8 oz (108.3 kg)   Constitutional: overweight, in NAD Eyes: no exophthalmos ENT: no masses palpated in neck, no cervical lymphadenopathy Cardiovascular: RRR, No MRG Respiratory: CTA B Musculoskeletal: no deformities Skin:  no rashes Neurological: no tremor with outstretched hands  ASSESSMENT: 1. DM2, uncontrolled, insulin-dep, with complications - CAD - CKD - PN - DR  - Tried to obtain U500 insulin but this was too expensive >> we had to go back to N and R insulin  2. HL  3.  Obesity class 1  4.  Elevated TSH  PLAN:  1. Patient with longstanding, uncontrolled, type 2 diabetes, on basal/bolus insulin regimen and weekly GLP-1 receptor agonist with improved HbA1c at last visit.  At that time his HbA1c was 7.3%, decreased from 8.1%.  Sugars were fluctuating within target range with higher blood sugars after breakfast and after lunch/dinner.  Upon questioning, he was still forgetting to take the mealtime insulin 15 to 20 minutes before the meals and we again discussed about trying to do this.  I did advise him that if he had to take it after the meal, to only take half of the dose, since sugars were dropping too much if taking the full dose.  I also advised him to decrease the dose of Tresiba to further avoid the risk of hypoglycemia. -He gets his medications through the patient assistance program.  Of note, his sugars improved significantly after switching from Levemir  to [place]. CGM interpretation: -At today's visit, we reviewed his CGM downloads: It appears that 82% of values are in target range (goal >70%), while 16% are higher than 180 (goal <25%), and 2% are lower than 70 (goal <4%).  The calculated average blood sugar is 139.  The projected HbA1c for the next 3 months (GMI) is 6.6%. -Reviewing the CGM trends, sugars are fluctuating within the target range.  At 1 instance in which his  blood sugars after breakfast increased to 300s, and he is not sure what could have happened.  However, this was just 1 incident.  He occasionally has higher blood sugars after dinner but he also forgets to take the insulin before the meal.  He has occasional low blood sugars after the meals related to taking insulin too late.  We again discussed that if he needs to take the insulin after the meal, to reduce the dose by approximately 50%.  He forgot about this but will start doing so.  Otherwise, I did not suggest a change in regimen. -I advised him to: Patient Instructions  Please continue: - Ozempic 1 mg weekly - NovoLog - 15-20 min before a meal: 30-35 units (if you take it after the meal, reduce the dose to 15-20 units before the meals) - Tresiba 44 units 2x a day  Please return for another visit in 3-4 months.   - we checked his HbA1c: 7.4% (higher than expected from the CGM) - advised to check sugars at different times of the day - 4x a day, rotating check times - advised for yearly eye exams >> he is UTD - he has decreased circulation in his feet (known PAD), also, almost no sensation to monofilament and onychomycosis.  I recommended to see a podiatrist.  He established care with [facility] since then.  Last foot exam was by Dr. Stacie Acres 07/04/2023. - I will see him back in 3 to 4 months  2. HL -Latest lipid panel was reviewed from 05/2023: LDL excellent, triglycerides slightly high, HDL slightly low: Lab Results  Component Value Date   CHOL 98 06/17/2023   HDL 32.10 (L) 06/17/2023   LDLCALC 28 06/17/2023   LDLDIRECT 69.0 09/05/2020   TRIG 191.0 (H) 06/17/2023   CHOLHDL 3 06/17/2023  -He was previously seen in the lipid clinic.  He is currently on Lipitor 40 mg daily, gemfibrozil 600 mg twice a day and Zetia 10 mg daily.  He tolerates these well.  3.  Obesity class 2 -Before the coronavirus pandemic, he was exercising at [employer], but he stopped exercising afterwards due  to back pain + sciatica and also leg weakness. -Will continue Ozempic which should also help with weight loss -He gained 4 pounds before last visit and 3 pounds since then  4.  Elevated TSH -He had an elevated TSH in 05/2020, however his TSH normalized afterwards: Lab Results  Component Value Date   TSH 3.71 06/17/2023  -No thyroid symptoms -Will continue to keep an eye on his TFTs  Carlus Pavlov, MD PhD [facility]

## 2023-08-04 NOTE — Addendum Note (Signed)
 Addended by: Pollie Meyer on: 08/04/2023 09:21 AM   Modules accepted: Orders

## 2023-08-04 NOTE — Patient Instructions (Addendum)
 Please continue: - Ozempic 1 mg weekly - NovoLog - 15-20 min before a meal: 30-35 units (if you take it after the meal, reduce the dose to 15-20 units before the meals) - Tresiba 44 units 2x a day  Please return for another visit in 3-4 months.

## 2023-08-05 ENCOUNTER — Telehealth: Payer: Self-pay | Admitting: Internal Medicine

## 2023-08-05 DIAGNOSIS — D631 Anemia in chronic kidney disease: Secondary | ICD-10-CM | POA: Diagnosis not present

## 2023-08-05 DIAGNOSIS — E1122 Type 2 diabetes mellitus with diabetic chronic kidney disease: Secondary | ICD-10-CM | POA: Diagnosis not present

## 2023-08-05 DIAGNOSIS — N189 Chronic kidney disease, unspecified: Secondary | ICD-10-CM | POA: Diagnosis not present

## 2023-08-05 DIAGNOSIS — E875 Hyperkalemia: Secondary | ICD-10-CM | POA: Diagnosis not present

## 2023-08-05 DIAGNOSIS — E559 Vitamin D deficiency, unspecified: Secondary | ICD-10-CM | POA: Diagnosis not present

## 2023-08-05 DIAGNOSIS — N184 Chronic kidney disease, stage 4 (severe): Secondary | ICD-10-CM | POA: Diagnosis not present

## 2023-08-05 DIAGNOSIS — I129 Hypertensive chronic kidney disease with stage 1 through stage 4 chronic kidney disease, or unspecified chronic kidney disease: Secondary | ICD-10-CM | POA: Diagnosis not present

## 2023-08-05 DIAGNOSIS — E872 Acidosis, unspecified: Secondary | ICD-10-CM | POA: Diagnosis not present

## 2023-08-05 NOTE — Telephone Encounter (Signed)
 Patient's wife, Bradan Congrove, brought by [employer] Patient Assistance Program Application to be completed.  Harriett Sine also brought financial information.  The form and financial information are in Dr. Charlean Sanfilippo folder in the front office.

## 2023-08-09 ENCOUNTER — Encounter (HOSPITAL_COMMUNITY): Payer: Self-pay | Admitting: *Deleted

## 2023-08-09 ENCOUNTER — Encounter: Payer: Self-pay | Admitting: Podiatry

## 2023-08-09 ENCOUNTER — Other Ambulatory Visit: Payer: Self-pay

## 2023-08-09 ENCOUNTER — Ambulatory Visit: Admitting: Podiatry

## 2023-08-09 ENCOUNTER — Ambulatory Visit (INDEPENDENT_AMBULATORY_CARE_PROVIDER_SITE_OTHER)

## 2023-08-09 ENCOUNTER — Inpatient Hospital Stay (HOSPITAL_COMMUNITY)
Admission: EM | Admit: 2023-08-09 | Discharge: 2023-08-16 | DRG: 279 | Disposition: A | Discharge status: Home / Self Care | Source: Ambulatory Visit | Attending: Internal Medicine | Admitting: Internal Medicine

## 2023-08-09 ENCOUNTER — Emergency Department (HOSPITAL_COMMUNITY)

## 2023-08-09 ENCOUNTER — Emergency Department (HOSPITAL_BASED_OUTPATIENT_CLINIC_OR_DEPARTMENT_OTHER)

## 2023-08-09 DIAGNOSIS — Z89421 Acquired absence of other right toe(s): Secondary | ICD-10-CM | POA: Diagnosis not present

## 2023-08-09 DIAGNOSIS — Z7982 Long term (current) use of aspirin: Secondary | ICD-10-CM

## 2023-08-09 DIAGNOSIS — E11621 Type 2 diabetes mellitus with foot ulcer: Secondary | ICD-10-CM | POA: Diagnosis present

## 2023-08-09 DIAGNOSIS — I10 Essential (primary) hypertension: Secondary | ICD-10-CM | POA: Diagnosis present

## 2023-08-09 DIAGNOSIS — Z87891 Personal history of nicotine dependence: Secondary | ICD-10-CM

## 2023-08-09 DIAGNOSIS — T82518A Breakdown (mechanical) of other cardiac and vascular devices and implants, initial encounter: Secondary | ICD-10-CM | POA: Diagnosis not present

## 2023-08-09 DIAGNOSIS — L97519 Non-pressure chronic ulcer of other part of right foot with unspecified severity: Secondary | ICD-10-CM | POA: Diagnosis not present

## 2023-08-09 DIAGNOSIS — L7601 Intraoperative hemorrhage and hematoma of skin and subcutaneous tissue complicating a dermatologic procedure: Secondary | ICD-10-CM | POA: Diagnosis not present

## 2023-08-09 DIAGNOSIS — N189 Chronic kidney disease, unspecified: Secondary | ICD-10-CM

## 2023-08-09 DIAGNOSIS — I739 Peripheral vascular disease, unspecified: Secondary | ICD-10-CM | POA: Diagnosis not present

## 2023-08-09 DIAGNOSIS — I252 Old myocardial infarction: Secondary | ICD-10-CM

## 2023-08-09 DIAGNOSIS — L089 Local infection of the skin and subcutaneous tissue, unspecified: Secondary | ICD-10-CM

## 2023-08-09 DIAGNOSIS — M86171 Other acute osteomyelitis, right ankle and foot: Secondary | ICD-10-CM | POA: Diagnosis not present

## 2023-08-09 DIAGNOSIS — E1140 Type 2 diabetes mellitus with diabetic neuropathy, unspecified: Secondary | ICD-10-CM | POA: Diagnosis present

## 2023-08-09 DIAGNOSIS — E669 Obesity, unspecified: Secondary | ICD-10-CM | POA: Diagnosis present

## 2023-08-09 DIAGNOSIS — Y712 Prosthetic and other implants, materials and accessory cardiovascular devices associated with adverse incidents: Secondary | ICD-10-CM | POA: Diagnosis not present

## 2023-08-09 DIAGNOSIS — R351 Nocturia: Secondary | ICD-10-CM | POA: Diagnosis not present

## 2023-08-09 DIAGNOSIS — E1142 Type 2 diabetes mellitus with diabetic polyneuropathy: Secondary | ICD-10-CM | POA: Diagnosis not present

## 2023-08-09 DIAGNOSIS — M79671 Pain in right foot: Secondary | ICD-10-CM

## 2023-08-09 DIAGNOSIS — N1832 Chronic kidney disease, stage 3b: Secondary | ICD-10-CM | POA: Diagnosis present

## 2023-08-09 DIAGNOSIS — Z794 Long term (current) use of insulin: Secondary | ICD-10-CM | POA: Diagnosis not present

## 2023-08-09 DIAGNOSIS — L03031 Cellulitis of right toe: Secondary | ICD-10-CM | POA: Diagnosis not present

## 2023-08-09 DIAGNOSIS — E1169 Type 2 diabetes mellitus with other specified complication: Secondary | ICD-10-CM | POA: Diagnosis present

## 2023-08-09 DIAGNOSIS — Z79899 Other long term (current) drug therapy: Secondary | ICD-10-CM | POA: Diagnosis not present

## 2023-08-09 DIAGNOSIS — I745 Embolism and thrombosis of iliac artery: Secondary | ICD-10-CM | POA: Diagnosis present

## 2023-08-09 DIAGNOSIS — N401 Enlarged prostate with lower urinary tract symptoms: Secondary | ICD-10-CM | POA: Diagnosis not present

## 2023-08-09 DIAGNOSIS — E11628 Type 2 diabetes mellitus with other skin complications: Secondary | ICD-10-CM | POA: Diagnosis present

## 2023-08-09 DIAGNOSIS — L97512 Non-pressure chronic ulcer of other part of right foot with fat layer exposed: Secondary | ICD-10-CM

## 2023-08-09 DIAGNOSIS — Z8639 Personal history of other endocrine, nutritional and metabolic disease: Secondary | ICD-10-CM | POA: Diagnosis not present

## 2023-08-09 DIAGNOSIS — I129 Hypertensive chronic kidney disease with stage 1 through stage 4 chronic kidney disease, or unspecified chronic kidney disease: Secondary | ICD-10-CM | POA: Diagnosis not present

## 2023-08-09 DIAGNOSIS — Y838 Other surgical procedures as the cause of abnormal reaction of the patient, or of later complication, without mention of misadventure at the time of the procedure: Secondary | ICD-10-CM | POA: Diagnosis not present

## 2023-08-09 DIAGNOSIS — I2510 Atherosclerotic heart disease of native coronary artery without angina pectoris: Secondary | ICD-10-CM | POA: Diagnosis present

## 2023-08-09 DIAGNOSIS — L97516 Non-pressure chronic ulcer of other part of right foot with bone involvement without evidence of necrosis: Secondary | ICD-10-CM | POA: Diagnosis present

## 2023-08-09 DIAGNOSIS — Y92234 Operating room of hospital as the place of occurrence of the external cause: Secondary | ICD-10-CM | POA: Diagnosis not present

## 2023-08-09 DIAGNOSIS — I70261 Atherosclerosis of native arteries of extremities with gangrene, right leg: Secondary | ICD-10-CM | POA: Diagnosis present

## 2023-08-09 DIAGNOSIS — M869 Osteomyelitis, unspecified: Secondary | ICD-10-CM | POA: Diagnosis not present

## 2023-08-09 DIAGNOSIS — E113393 Type 2 diabetes mellitus with moderate nonproliferative diabetic retinopathy without macular edema, bilateral: Secondary | ICD-10-CM | POA: Diagnosis present

## 2023-08-09 DIAGNOSIS — Z9889 Other specified postprocedural states: Secondary | ICD-10-CM | POA: Diagnosis not present

## 2023-08-09 DIAGNOSIS — E1152 Type 2 diabetes mellitus with diabetic peripheral angiopathy with gangrene: Secondary | ICD-10-CM | POA: Diagnosis not present

## 2023-08-09 DIAGNOSIS — L97509 Non-pressure chronic ulcer of other part of unspecified foot with unspecified severity: Secondary | ICD-10-CM | POA: Diagnosis not present

## 2023-08-09 DIAGNOSIS — T148XXA Other injury of unspecified body region, initial encounter: Secondary | ICD-10-CM

## 2023-08-09 DIAGNOSIS — M7731 Calcaneal spur, right foot: Secondary | ICD-10-CM | POA: Diagnosis not present

## 2023-08-09 DIAGNOSIS — E785 Hyperlipidemia, unspecified: Secondary | ICD-10-CM | POA: Diagnosis present

## 2023-08-09 DIAGNOSIS — I70235 Atherosclerosis of native arteries of right leg with ulceration of other part of foot: Secondary | ICD-10-CM | POA: Diagnosis not present

## 2023-08-09 DIAGNOSIS — I70202 Unspecified atherosclerosis of native arteries of extremities, left leg: Secondary | ICD-10-CM | POA: Diagnosis present

## 2023-08-09 DIAGNOSIS — M19071 Primary osteoarthritis, right ankle and foot: Secondary | ICD-10-CM | POA: Diagnosis not present

## 2023-08-09 DIAGNOSIS — M79674 Pain in right toe(s): Secondary | ICD-10-CM | POA: Diagnosis present

## 2023-08-09 DIAGNOSIS — Z6836 Body mass index (BMI) 36.0-36.9, adult: Secondary | ICD-10-CM | POA: Diagnosis not present

## 2023-08-09 DIAGNOSIS — Z7985 Long-term (current) use of injectable non-insulin antidiabetic drugs: Secondary | ICD-10-CM

## 2023-08-09 DIAGNOSIS — Z8679 Personal history of other diseases of the circulatory system: Secondary | ICD-10-CM

## 2023-08-09 DIAGNOSIS — E1122 Type 2 diabetes mellitus with diabetic chronic kidney disease: Secondary | ICD-10-CM | POA: Diagnosis present

## 2023-08-09 DIAGNOSIS — R600 Localized edema: Secondary | ICD-10-CM | POA: Diagnosis not present

## 2023-08-09 DIAGNOSIS — E1165 Type 2 diabetes mellitus with hyperglycemia: Secondary | ICD-10-CM | POA: Diagnosis not present

## 2023-08-09 DIAGNOSIS — M25474 Effusion, right foot: Secondary | ICD-10-CM | POA: Diagnosis not present

## 2023-08-09 LAB — COMPREHENSIVE METABOLIC PANEL
ALT: 16 U/L (ref 0–44)
AST: 17 U/L (ref 15–41)
Albumin: 3.3 g/dL — ABNORMAL LOW (ref 3.5–5.0)
Alkaline Phosphatase: 51 U/L (ref 38–126)
Anion gap: 9 (ref 5–15)
BUN: 28 mg/dL — ABNORMAL HIGH (ref 8–23)
CO2: 23 mmol/L (ref 22–32)
Calcium: 9.5 mg/dL (ref 8.9–10.3)
Chloride: 106 mmol/L (ref 98–111)
Creatinine, Ser: 2.34 mg/dL — ABNORMAL HIGH (ref 0.61–1.24)
GFR, Estimated: 27 mL/min — ABNORMAL LOW (ref >60)
Glucose, Bld: 155 mg/dL — ABNORMAL HIGH (ref 70–99)
Potassium: 4.8 mmol/L (ref 3.5–5.1)
Sodium: 138 mmol/L (ref 135–145)
Total Bilirubin: 0.5 mg/dL (ref 0.0–1.2)
Total Protein: 7 g/dL (ref 6.5–8.1)

## 2023-08-09 LAB — CBC WITH DIFFERENTIAL/PLATELET
Abs Immature Granulocytes: 0.04 10*3/uL (ref 0.00–0.07)
Basophils Absolute: 0.1 10*3/uL (ref 0.0–0.1)
Basophils Relative: 1 %
Eosinophils Absolute: 0.5 10*3/uL (ref 0.0–0.5)
Eosinophils Relative: 4 %
HCT: 42.1 % (ref 39.0–52.0)
Hemoglobin: 13.4 g/dL (ref 13.0–17.0)
Immature Granulocytes: 0 %
Lymphocytes Relative: 16 %
Lymphs Abs: 2.2 10*3/uL (ref 0.7–4.0)
MCH: 29 pg (ref 26.0–34.0)
MCHC: 31.8 g/dL (ref 30.0–36.0)
MCV: 91.1 fL (ref 80.0–100.0)
Monocytes Absolute: 1.5 10*3/uL — ABNORMAL HIGH (ref 0.1–1.0)
Monocytes Relative: 11 %
Neutro Abs: 9.5 10*3/uL — ABNORMAL HIGH (ref 1.7–7.7)
Neutrophils Relative %: 68 %
Platelets: 309 10*3/uL (ref 150–400)
RBC: 4.62 MIL/uL (ref 4.22–5.81)
RDW: 14.7 % (ref 11.5–15.5)
WBC: 13.8 10*3/uL — ABNORMAL HIGH (ref 4.0–10.5)
nRBC: 0 % (ref 0.0–0.2)

## 2023-08-09 LAB — CBC
HCT: 39.8 % (ref 39.0–52.0)
Hemoglobin: 13.1 g/dL (ref 13.0–17.0)
MCH: 29 pg (ref 26.0–34.0)
MCHC: 32.9 g/dL (ref 30.0–36.0)
MCV: 88.1 fL (ref 80.0–100.0)
Platelets: 279 10*3/uL (ref 150–400)
RBC: 4.52 MIL/uL (ref 4.22–5.81)
RDW: 14.6 % (ref 11.5–15.5)
WBC: 12.2 10*3/uL — ABNORMAL HIGH (ref 4.0–10.5)
nRBC: 0 % (ref 0.0–0.2)

## 2023-08-09 LAB — URINALYSIS, W/ REFLEX TO CULTURE (INFECTION SUSPECTED)
Bacteria, UA: NONE SEEN
Bilirubin Urine: NEGATIVE
Glucose, UA: 50 mg/dL — AB
Ketones, ur: NEGATIVE mg/dL
Leukocytes,Ua: NEGATIVE
Nitrite: NEGATIVE
Protein, ur: 30 mg/dL — AB
Specific Gravity, Urine: 1.015 (ref 1.005–1.030)
pH: 5 (ref 5.0–8.0)

## 2023-08-09 LAB — VAS US ABI WITH/WO TBI
Left ABI: 0.83
Right ABI: 0.56

## 2023-08-09 LAB — CREATININE, SERUM
Creatinine, Ser: 2.34 mg/dL — ABNORMAL HIGH (ref 0.61–1.24)
GFR, Estimated: 27 mL/min — ABNORMAL LOW (ref >60)

## 2023-08-09 LAB — GLUCOSE, CAPILLARY
Glucose-Capillary: 68 mg/dL — ABNORMAL LOW (ref 70–99)
Glucose-Capillary: 106 mg/dL — ABNORMAL HIGH (ref 70–99)

## 2023-08-09 LAB — MRSA NEXT GEN BY PCR, NASAL: MRSA by PCR Next Gen: NOT DETECTED

## 2023-08-09 LAB — I-STAT CG4 LACTIC ACID, ED: Lactic Acid, Venous: 0.8 mmol/L (ref 0.5–1.9)

## 2023-08-09 MED ORDER — ASPIRIN 81 MG PO TBEC
81.0000 mg | DELAYED_RELEASE_TABLET | Freq: Every day | ORAL | Status: DC
  Administered 2023-08-10 – 2023-08-16 (×7): 81 mg via ORAL
  Filled 2023-08-09 (×7): qty 1

## 2023-08-09 MED ORDER — SODIUM CHLORIDE 0.9 % IV SOLN
3.0000 g | Freq: Once | INTRAVENOUS | Status: AC
  Administered 2023-08-09: 3 g via INTRAVENOUS
  Filled 2023-08-09: qty 8

## 2023-08-09 MED ORDER — ACETAMINOPHEN 325 MG PO TABS
650.0000 mg | ORAL_TABLET | Freq: Four times a day (QID) | ORAL | Status: DC | PRN
  Administered 2023-08-09 – 2023-08-10 (×2): 650 mg via ORAL
  Filled 2023-08-09 (×2): qty 2

## 2023-08-09 MED ORDER — INSULIN DEGLUDEC 100 UNIT/ML ~~LOC~~ SOPN: 40.0000 [IU] | PEN_INJECTOR | Freq: Every day | SUBCUTANEOUS | Status: DC

## 2023-08-09 MED ORDER — ATORVASTATIN CALCIUM 40 MG PO TABS
40.0000 mg | ORAL_TABLET | Freq: Every day | ORAL | Status: DC
  Administered 2023-08-10 – 2023-08-16 (×7): 40 mg via ORAL
  Filled 2023-08-09 (×7): qty 1

## 2023-08-09 MED ORDER — FAMOTIDINE 20 MG PO TABS: 20.0000 mg | ORAL_TABLET | Freq: Every day | ORAL | Status: DC | PRN

## 2023-08-09 MED ORDER — ALBUTEROL SULFATE (2.5 MG/3ML) 0.083% IN NEBU: 2.5000 mg | INHALATION_SOLUTION | RESPIRATORY_TRACT | Status: DC | PRN

## 2023-08-09 MED ORDER — ACETAMINOPHEN 650 MG RE SUPP: 650.0000 mg | Freq: Four times a day (QID) | RECTAL | Status: DC | PRN

## 2023-08-09 MED ORDER — EZETIMIBE 10 MG PO TABS
10.0000 mg | ORAL_TABLET | Freq: Every day | ORAL | Status: DC
  Administered 2023-08-10 – 2023-08-16 (×7): 10 mg via ORAL
  Filled 2023-08-09 (×7): qty 1

## 2023-08-09 MED ORDER — ENOXAPARIN SODIUM 40 MG/0.4ML IJ SOSY: 40.0000 mg | PREFILLED_SYRINGE | INTRAMUSCULAR | Status: DC

## 2023-08-09 MED ORDER — ENOXAPARIN SODIUM 30 MG/0.3ML IJ SOSY
30.0000 mg | PREFILLED_SYRINGE | INTRAMUSCULAR | Status: DC
  Administered 2023-08-09 – 2023-08-11 (×3): 30 mg via SUBCUTANEOUS
  Filled 2023-08-09 (×3): qty 0.3

## 2023-08-09 MED ORDER — VANCOMYCIN HCL IN DEXTROSE 1-5 GM/200ML-% IV SOLN: 1000.0000 mg | Freq: Once | INTRAVENOUS | Status: DC

## 2023-08-09 MED ORDER — VANCOMYCIN HCL 2000 MG/400ML IV SOLN
2000.0000 mg | Freq: Once | INTRAVENOUS | Status: AC
  Administered 2023-08-09: 2000 mg via INTRAVENOUS
  Filled 2023-08-09: qty 400

## 2023-08-09 MED ORDER — METOPROLOL TARTRATE 50 MG PO TABS
50.0000 mg | ORAL_TABLET | Freq: Every day | ORAL | Status: DC
  Administered 2023-08-10 – 2023-08-16 (×7): 50 mg via ORAL
  Filled 2023-08-09 (×7): qty 1

## 2023-08-09 MED ORDER — VITAMIN D 25 MCG (1000 UNIT) PO TABS
1000.0000 [IU] | ORAL_TABLET | Freq: Every day | ORAL | Status: DC
  Administered 2023-08-10 – 2023-08-16 (×7): 1000 [IU] via ORAL
  Filled 2023-08-09 (×7): qty 1

## 2023-08-09 MED ORDER — SODIUM CHLORIDE 0.9 % IV SOLN
1.5000 g | Freq: Three times a day (TID) | INTRAVENOUS | Status: DC
  Administered 2023-08-10 – 2023-08-16 (×19): 1.5 g via INTRAVENOUS
  Filled 2023-08-09 (×24): qty 4

## 2023-08-09 MED ORDER — GEMFIBROZIL 600 MG PO TABS
300.0000 mg | ORAL_TABLET | Freq: Every day | ORAL | Status: DC
  Administered 2023-08-09 – 2023-08-15 (×6): 300 mg via ORAL
  Filled 2023-08-09 (×8): qty 0.5

## 2023-08-09 MED ORDER — ACETAMINOPHEN 500 MG PO TABS: 500.0000 mg | ORAL_TABLET | Freq: Four times a day (QID) | ORAL | Status: DC | PRN

## 2023-08-09 MED ORDER — ALBUTEROL SULFATE (2.5 MG/3ML) 0.083% IN NEBU
2.5000 mg | INHALATION_SOLUTION | Freq: Four times a day (QID) | RESPIRATORY_TRACT | Status: DC
  Filled 2023-08-09: qty 3

## 2023-08-09 MED ORDER — ONDANSETRON HCL 4 MG/2ML IJ SOLN
4.0000 mg | Freq: Four times a day (QID) | INTRAMUSCULAR | Status: DC | PRN
Start: 2023-08-09 — End: 2023-08-16

## 2023-08-09 MED ORDER — INSULIN ASPART 100 UNIT/ML IJ SOLN
10.0000 [IU] | Freq: Three times a day (TID) | INTRAMUSCULAR | Status: DC
  Administered 2023-08-10: 10 [IU] via SUBCUTANEOUS

## 2023-08-09 MED ORDER — INSULIN GLARGINE-YFGN 100 UNIT/ML ~~LOC~~ SOLN
50.0000 [IU] | Freq: Every day | SUBCUTANEOUS | Status: DC
  Filled 2023-08-09 (×2): qty 0.5

## 2023-08-09 MED ORDER — SODIUM CHLORIDE 0.9 % IV SOLN
1.5000 g | Freq: Three times a day (TID) | INTRAVENOUS | Status: DC
  Filled 2023-08-09 (×2): qty 4

## 2023-08-09 MED ORDER — INSULIN DEGLUDEC 100 UNIT/ML ~~LOC~~ SOPN: 50.0000 [IU] | PEN_INJECTOR | Freq: Every day | SUBCUTANEOUS | Status: DC

## 2023-08-09 MED ORDER — SODIUM BICARBONATE 650 MG PO TABS
650.0000 mg | ORAL_TABLET | Freq: Two times a day (BID) | ORAL | Status: DC
  Administered 2023-08-09 – 2023-08-16 (×13): 650 mg via ORAL
  Filled 2023-08-09 (×14): qty 1

## 2023-08-09 MED ORDER — ONDANSETRON HCL 4 MG PO TABS
4.0000 mg | ORAL_TABLET | Freq: Four times a day (QID) | ORAL | Status: DC | PRN
Start: 2023-08-09 — End: 2023-08-16

## 2023-08-09 NOTE — Progress Notes (Signed)
 Subjective:  Patient ID: Eric Choi, male    DOB: [birth year],  MRN: [id number]  Chief Complaint  Patient presents with   Toe Pain    RM#11 Right foot second toe swollen states it started several weeks ago with pain then redness no injury.Patient is diabetic and is concerned.    Discussed the use of AI scribe software for clinical note transcription with the patient, who gave verbal consent to proceed.  History of Present Illness The patient, with a history of diabetes, presents with a red, swollen toe with a sore at the end. The issue has been ongoing for about two weeks. The patient reports that the toe was previously treated and was given a toe cap to protect it. However, the patient noticed blood coming out of the side of the toe and part of the skin was visible. The patient denies having any recent antibiotics or experiencing fevers or chills. The patient's blood sugar has been well-controlled, with a recent A1c of 7.4. The patient also reports having had a circulation test done in November of the previous year.      Objective:    Physical Exam General: AAO x3, NAD  Dermatological: This patient below there is a full-thickness ulceration noted to the distal aspect of right second toe with hyperkeratotic tissue.  Upon debridement there is necrotic tissue present there is malodor present.  There is edema and erythema present of the second toe on the dorsal aspect of the foot.  No crepitation.  Vascular: Dorsalis Pedis artery and Posterior Tibial artery pedal pulses are decreased. There is no pain with calf compression, swelling, warmth, erythema.   Neruologic: Sensation decreased  Musculoskeletal: Tenderness to the right second toe.  Gait: Unassisted, Nonantalgic.          Results Procedure: Debridement of infected toe wound Description: Necrotic tissue was removed from the infected toe. No purulent discharge was found underneath the necrotic tissue. The area had a  malodor indicative of infection. Informed Consent: The patient was informed about the risks of infection spreading, the potential need for IV antibiotics, and the possibility of losing the toe if not treated promptly. The benefits of debridement and alternatives such as hospitalization for IV antibiotics were discussed.  LABS A1c: 7.4 (08/04/2023)  RADIOLOGY Leg ultrasound: Decreased blood flow (04/08/2023) Foot x-ray: Edema noted to the second toe.  Radiolucency is noted in the distal toe soft tissue. Possible chronic changes to the distal phalanx but cannot rule out osteomyelitis.   Assessment:   1. Toe ulcer, right, with fat layer exposed (HCC)   2. PAD (peripheral artery disease) (HCC)   3. Controlled type 2 diabetes mellitus with diabetic polyneuropathy, with long-term current use of insulin (HCC)      Plan:  Patient was evaluated and treated and all questions answered.  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  Upon admission the IV antibiotics, MRI, vascular workup.  Unfortunate discussed he is at high risk of amputation.  - Admit for IV antibiotics and further evaluation. - Consult vascular surgery for assessment and potential intervention. Podiatry will also see the patient. Message sent to Dr. Annamary Rummage who is covering for [place]. Our office spoke with Maralyn Sago in triage at [facility].  -Medically necessary wound debridements performed today.  Sharply debrided the hyperkeratotic, infected tissue at the distal aspect of the liver 312  with scalpel.  Bleeding was noted.  There is necrotic tissue present.  Antibiotic ointment was applied followed by dressing.  Tolerated well. -Surgical shoe dispensed for offloading.    Vivi Barrack DPM

## 2023-08-09 NOTE — ED Provider Notes (Signed)
 [place] [facility] AT [facility] Provider Note   CSN: [id number] Arrival date & time: 08/09/23  1056     History  Chief Complaint  Patient presents with   Wound Check    Eric Choi is a 83 y.o. male history of diabetes, hypertension, claudication, CAD here for evaluation of wound to his right foot.  Was seen by podiatry earlier today and subsequently sent here to the emergency department.  Second digit on his right lower extremity apparently started with a distal wound a few weeks ago.  Toe became painful and then red.  No fever, chills, cough, chest pain, shortness of breath, numbness or weakness.  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  He denies any recent trauma or injuries that he is aware of.   Fever, chill, numbness, weakness, proximal edema to leg  Claudication sx: None.  Has some pain to the distal aspect of his toe however no pain throughout his foot, calf.  He is able to walk distances without any significant pain to his legs. States he had "my arteries checked in my legs" not that long ago and they are per patient "normal" >>>> reviewed right ABI which showed moderate right lower extremity arterial disease imaging done 04/08/23  Previous meds  Seen by Dr. Ardelle Anton with [facility].  There was suspicion for osteomyelitis on imaging there.  They had recommended ED evaluation and likely admission for IV antibiotics, MRI and vascular workup.  Wound was noted to have necrotic tissue.  HPI     Home Medications Prior to Admission medications   Medication Sig Start Date End Date Taking? Authorizing Provider  acetaminophen (TYLENOL) 500 MG tablet Take 500 mg by mouth every 6 (six) hours as needed for moderate pain.    [provider]  amLODipine (NORVASC) 10 MG tablet TAKE 1 TABLET BY MOUTH DAILY 06/15/23   Shirline Frees, NP  aspirin 81 MG tablet Take 81 mg by mouth daily.    [provider]  atorvastatin (LIPITOR) 40 MG tablet Take 1 tablet (40 mg total) by mouth daily. 07/05/23   Nafziger, Kandee Keen, NP  cholecalciferol (VITAMIN D3) 25 MCG (1000 UNIT) tablet Take 1,000 Units by mouth daily.    [provider]  Continuous Blood Gluc Receiver (FREESTYLE LIBRE 2 READER) DEVI Use as instructed 01/31/21   Carlus Pavlov, MD  Continuous Glucose Sensor (FREESTYLE LIBRE 2 SENSOR) MISC USE AND CHANGE EVERY 14 DAYS AS DIRECTED 06/15/23   Carlus Pavlov, MD  ezetimibe (ZETIA) 10 MG tablet TAKE 1 TABLET BY MOUTH DAILY 03/17/23   Nafziger, Kandee Keen, NP  famotidine (PEPCID) 20 MG tablet Take 20 mg by mouth daily.    [provider]  gemfibrozil (LOPID) 600 MG tablet TAKE 1 TABLET BY MOUTH DAILY AT 2:00 P.M. 06/29/23   Nafziger, Kandee Keen, NP  insulin aspart (NOVOLOG FLEXPEN) 100 UNIT/ML FlexPen Inject 15-35 Units into the skin 3 (three) times daily before meals. 08/09/22   Carlus Pavlov, MD  insulin degludec (TRESIBA FLEXTOUCH) 100 UNIT/ML FlexTouch Pen Inject 56 Units into the skin 2 (two) times daily. Patient taking differently: Inject 44 Units into the skin 2 (two) times daily. 08/09/22   Carlus Pavlov, MD  Insulin Pen Needle (TECHLITE PEN NEEDLES) 31G X 5 MM MISC Use as directed with insulin. 07/26/22   Carlus Pavlov, MD  lisinopril (ZESTRIL) 20 MG tablet Take 20 mg by mouth daily.    [provider]  METAMUCIL FIBER PO Take 1 Dose by mouth daily. 1 dose= 1 tablespoon    [provider]  metoprolol tartrate (LOPRESSOR) 50 MG tablet TAKE 1 TABLET BY MOUTH DAILY 06/17/23   Nafziger, Kandee Keen, NP  NEEDLE, DISP, 30 G 30G X 1" MISC by Does not apply route as directed.    [provider]  Semaglutide, 1 MG/DOSE, 4 MG/3ML SOPN Inject 1 mg as directed once a week. 12/23/21   Carlus Pavlov, MD  sodium bicarbonate 650 MG tablet Take 650 mg by mouth 2 (two) times daily.    [provider]      Allergies    Patient has no known allergies.     Review of Systems   Review of Systems  Constitutional: Negative.   HENT: Negative.    Cardiovascular: Negative.   Gastrointestinal: Negative.   Genitourinary: Negative.   Musculoskeletal:        Right second digit foot pain  Skin:  Positive for wound.  Neurological: Negative.   All other systems reviewed and are negative.   Physical Exam Updated Vital Signs BP 132/80 (BP Location: Right Arm)   Pulse 82   Temp 97.8 F (36.6 C) (Oral)   Resp 20   Ht 5\' 8"  (1.727 m)   Wt 106.8 kg   SpO2 98%   BMI 35.80 kg/m  Physical Exam Vitals and nursing note reviewed.  Constitutional:      General: He is not in acute distress.    Appearance: He is well-developed. He is not ill-appearing, toxic-appearing or diaphoretic.  HENT:     Head: Normocephalic and atraumatic.  Eyes:     Pupils: Pupils are equal, round, and reactive to light.  Cardiovascular:     Rate and Rhythm: Normal rate and regular rhythm.     Pulses:          Dorsalis pedis pulses are detected w/ Doppler on the right side and detected w/ Doppler on the left side.     Heart sounds: Normal heart sounds.  Pulmonary:     Effort: Pulmonary effort is normal. No respiratory distress.     Breath sounds: Normal breath sounds.  Abdominal:     General: Bowel sounds are normal. There is no distension.     Palpations: Abdomen is soft.  Musculoskeletal:        General: Normal range of motion.     Cervical back: Normal range of motion and neck supple.     Comments: Tenderness diffusely to right second digit and into the midfoot.  There is any erythema to the toe and into the midfoot consistent with cellulitis.  His compartments are soft.  He was a plantarflex, dorsiflex without difficulty.  Skin:    General: Skin is warm and dry.     Capillary Refill: Capillary refill takes less than 2 seconds.     Findings: Erythema present.     Comments: Ulcerative lesion with necrotic tissue right second distal digit erythema from digit into  =  midfoot.  No crepitus, fluctuance or induration.  Neurological:     General: No focal deficit present.     Mental Status: He is alert and oriented to person, place, and time.     Cranial Nerves: No cranial nerve deficit.     Motor: No weakness.     Gait: Gait normal.     ED Results / Procedures / Treatments   Labs (all labs ordered are listed, but only abnormal results are  displayed) Labs Reviewed  COMPREHENSIVE METABOLIC PANEL - Abnormal; Notable for the following components:      Result Value   Glucose, Bld 155 (*)    BUN 28 (*)    Creatinine, Ser 2.34 (*)    Albumin 3.3 (*)    GFR, Estimated 27 (*)    All other components within normal limits  CBC WITH DIFFERENTIAL/PLATELET - Abnormal; Notable for the following components:   WBC 13.8 (*)    Neutro Abs 9.5 (*)    Monocytes Absolute 1.5 (*)    All other components within normal limits  URINALYSIS, W/ REFLEX TO CULTURE (INFECTION SUSPECTED) - Abnormal; Notable for the following components:   Glucose, UA 50 (*)    Hgb urine dipstick SMALL (*)    Protein, ur 30 (*)    All other components within normal limits  I-STAT CG4 LACTIC ACID, ED    EKG None  Radiology VAS [place] ABI WITH/WO TBI Result Date: 08/09/2023  LOWER EXTREMITY DOPPLER STUDY Patient Name:  Eric Choi  Date of Exam:   08/09/2023 Medical Rec #: [id number]       Accession #:    [id number] Date of Birth: 05-23-1940       Patient Gender: M Patient Age:   64 years Exam Location:  [facility] Procedure:      VAS [place] ABI WITH/WO TBI Referring Phys: Reshad Saab --------------------------------------------------------------------------------  Indications: Ulceration. High Risk Factors: Hypertension, hyperlipidemia, Diabetes, past history of                    smoking, prior MI, coronary artery disease.  Vascular Interventions: Hx of RT CEA (12/31/2020). Comparison Study: Previous exam 04/08/23 Performing Technologist: Ernestene Mention RVT/RDMS  Examination Guidelines: A  complete evaluation includes at minimum, Doppler waveform signals and systolic blood pressure reading at the level of bilateral brachial, anterior tibial, and posterior tibial arteries, when vessel segments are accessible. Bilateral testing is considered an integral part of a complete examination. Photoelectric Plethysmograph (PPG) waveforms and toe systolic pressure readings are included as required and additional duplex testing as needed. Limited examinations for reoccurring indications may be performed as noted.  ABI Findings: +---------+------------------+-----+----------+--------+ Right    Rt Pressure (mmHg)IndexWaveform  Comment  +---------+------------------+-----+----------+--------+ Brachial 159                    triphasic          +---------+------------------+-----+----------+--------+ PTA      95                0.56 monophasic         +---------+------------------+-----+----------+--------+ DP       80                0.47 monophasic         +---------+------------------+-----+----------+--------+ Great Toe27                0.16 Abnormal           +---------+------------------+-----+----------+--------+ +---------+------------------+-----+----------+-------+ Left     Lt Pressure (mmHg)IndexWaveform  Comment +---------+------------------+-----+----------+-------+ Brachial 169                    triphasic         +---------+------------------+-----+----------+-------+ PTA      140               0.83 biphasic          +---------+------------------+-----+----------+-------+ DP       95  0.56 monophasic        +---------+------------------+-----+----------+-------+ Great Toe60                0.36 Abnormal          +---------+------------------+-----+----------+-------+ +-------+-----------+-----------+------------+------------+ ABI/TBIToday's ABIToday's TBIPrevious ABIPrevious TBI  +-------+-----------+-----------+------------+------------+ Right  0.56       0.16       0.68        0.41         +-------+-----------+-----------+------------+------------+ Left   0.83       0.36       0.85        0.58         +-------+-----------+-----------+------------+------------+  Right ABIs appear decreased. Bilateral TBIs appear decreased.  Summary: Right: Resting right ankle-brachial index indicates moderate right lower extremity arterial disease. The right toe-brachial index is abnormal. Left: Resting left ankle-brachial index indicates mild left lower extremity arterial disease. The left toe-brachial index is abnormal. *See table(s) above for measurements and observations.  Electronically signed by Carolynn Sayers on 08/09/2023 at 3:01:55 PM.    Final    DG Foot Complete Right Result Date: 08/09/2023 CLINICAL DATA:  Second toe ulcer. EXAM: RIGHT FOOT COMPLETE - 3+ VIEW COMPARISON:  Radiographs of same day. FINDINGS: There is lytic destruction involving distal tuft of second distal phalanx consistent with osteomyelitis, with overlying soft tissue ulceration. Mild posterior calcaneal spurring is noted. Vascular calcifications are noted. No fracture or dislocation is noted. IMPRESSION: Lytic destruction involving distal tuft of second distal phalanx consistent with osteomyelitis with overlying soft tissue ulceration. Electronically Signed   By: Lupita Raider M.D.   On: 08/09/2023 15:01   DG Foot Complete Right Result Date: 08/09/2023 Please see detailed radiograph report in office note.   Procedures Procedures    Medications Ordered in ED Medications  Ampicillin-Sulbactam (UNASYN) 3 g in sodium chloride 0.9 % 100 mL IVPB (has no administration in time range)  vancomycin (VANCOREADY) IVPB 2000 mg/400 mL (2,000 mg Intravenous New Bag/Given 08/09/23 1526)    ED Course/ Medical Decision Making/ A&P   83 year old here for evaluation of wound to his second digit on his right lower  extremity.  Has been present for a few weeks.  Seen by podiatry earlier today.  Concern for osteomyelitis and persistent wound.  They recommended ED evaluation for likely admission for IV antibiotics, further imaging and vascular consult given his history of claudication and diabetes.  Afebrile, nonseptic, non-ill-appearing.  Ulcerative lesion surrounding erythema, some necrotic tissue to distal wound right second digit, erythematous streaks into midfoot.  He has Doppler pulses.  He denies any claudication symptoms however I reviewed his ABI from November which does show moderate disease.  He states he does have a history of neuropathy however cannot feel his feet.  He denies any trauma.   Labs and imaging personally viewed and interpreted:  CBC leukocytosis 13.8 Metabolic panel creatinine 2.34--appears to point to his baseline X-ray right foot consistent with osteomyelitis second digit right lower extremity Ultrasound ABI moderate arterial disease   Secure message Dr. Annamary Rummage with Podiatry, agreeable to admission for IV antibiotics, Vanc/Unasyn okay, will plan on seeing patient in consult  Patient reassessed with wife at bedside.  We discussed labs and imaging.  Will admit to the medicine service  Discussed with Dr. Lajuana Ripple with medicine who is agreeable to evaluate patient for admission.  The patient appears reasonably stabilized for admission considering the current resources, flow, and capabilities available in the ED at this time, and  I doubt any other EMC requiring further screening and/or treatment in the ED prior to admission.                                     Medical Decision Making Amount and/or Complexity of Data Reviewed Independent Historian: spouse External Data Reviewed: labs, radiology and notes. Labs: ordered. Decision-making details documented in ED Course. Radiology: ordered and independent interpretation performed. Decision-making details documented in ED  Course.  Risk OTC drugs. Prescription drug management. Parenteral controlled substances. Decision regarding hospitalization. Diagnosis or treatment significantly limited by social determinants of health.          Final Clinical Impression(s) / ED Diagnoses Final diagnoses:  Osteomyelitis of right foot, unspecified type (HCC)  Chronic kidney disease, unspecified CKD stage  History of diabetes mellitus  PAD (peripheral artery disease) (HCC)    Rx / DC Orders ED Discharge Orders     None         Alexi Dorminey A, PA-C 08/09/23 1829    Gwyneth Sprout, MD 08/13/23 2221

## 2023-08-09 NOTE — ED Triage Notes (Addendum)
 Pt was seen at pcp this morning for wound on right foot and was told to come to hospital and be seen. Pt has sensation to foot and is ambulatory. PCP thinks wound is infected. Pt denies fever. Pt has 7/10 sharp intermittent pains to right foot. Foot is red and warm at base of toe. Pulse present

## 2023-08-09 NOTE — Progress Notes (Signed)
 ABI exam has been completed.   Results can be found under chart review under CV PROC. 08/09/2023 2:52 PM Qiana Landgrebe RVT, RDMS

## 2023-08-09 NOTE — Telephone Encounter (Signed)
 Faxed back.

## 2023-08-09 NOTE — ED Notes (Signed)
 Attempted to call [facility]N 3 times. No answer. Pt bed has been ready for 40 min and will send pt up now

## 2023-08-09 NOTE — H&P (Signed)
 History and Physical    DOA: 08/09/2023  PCP: Shirline Frees, NP  Patient coming from: home  Chief Complaint: Sent from podiatry office for toe infection  HPI: Eric Choi is a 83 y.o. male with history h/o hypertension, hyperlipidemia, CAD, PAD, uncontrolled diabetes mellitus-insulin-dependent presents from podiatry office for right second toe infection.  Patient states about 3 weeks back he was seen in podiatry office for toenail clipping and apparently noted to have a dark spot in the nailbed.  After he went home he had some pain and last week he noted worsening pain-describes as sharp/stabbing, intermittent and 10/10 at its worst, associated with brownish drainage onto his sock that he noticed over the weekend and called podiatry office yesterday.  He was seen today in the clinic, underwent debridement of the foot ulcer which appeared to be infected and underwent x-ray->sent to the ED in concern for osteomyelitis.  Patient denies any fevers at home and was afebrile on presentation here.  Hemodynamically stable on arrival.  Received IV Unasyn and vancomycin in the ED.  Requested to be admitted for IV antibiotic treatment.   Review of Systems: As per HPI, otherwise review of systems negative.    Past Medical History:  Diagnosis Date   CAD (coronary artery disease)    Colon polyp    DIABETES MELLITUS, TYPE II 11/03/2006   Dyspnea    HYPERLIPIDEMIA 11/03/2006   HYPERTENSION 11/03/2006   Myocardial infarction [facility])    PAD (peripheral artery disease) (HCC)     Past Surgical History:  Procedure Laterality Date   ANGIOPLASTY  20 years ago   percutaneous transluminal    CATARACT EXTRACTION  2002, 2005   bilateral   ENDARTERECTOMY Right 12/31/2020   Procedure: RIGHT CAROTID ENDARTERECTOMY;  Surgeon: Nada Libman, MD;  Location: [facility] OR;  Service: Vascular;  Laterality: Right;   PATCH ANGIOPLASTY Right 12/31/2020   Procedure: PATCH ANGIOPLASTY OF RIGHT CAROTID ARTERY USING XENOSURE  BOVINE PERICARDIUM PATCH;  Surgeon: Nada Libman, MD;  Location: [facility] OR;  Service: Vascular;  Laterality: Right;    Social history:  reports that he quit smoking about 32 years ago. His smoking use included cigarettes. He has never been exposed to tobacco smoke. He has never used smokeless tobacco. He reports that he does not drink alcohol and does not use drugs.   No Known Allergies  Family History  Problem Relation Age of Onset   COPD Father    Alcohol abuse Father    Diabetes Father    Cancer Sister        breast   Stroke Mother    Colon cancer Neg Hx       Prior to Admission medications   Medication Sig Start Date End Date Taking? Authorizing Provider  acetaminophen (TYLENOL) 500 MG tablet Take 500 mg by mouth every 6 (six) hours as needed for moderate pain.   Yes [provider]  amLODipine (NORVASC) 10 MG tablet TAKE 1 TABLET BY MOUTH DAILY 06/15/23  Yes Nafziger, Kandee Keen, NP  aspirin 81 MG tablet Take 81 mg by mouth daily.   Yes [provider]  atorvastatin (LIPITOR) 40 MG tablet Take 1 tablet (40 mg total) by mouth daily. 07/05/23  Yes Nafziger, Kandee Keen, NP  cholecalciferol (VITAMIN D3) 25 MCG (1000 UNIT) tablet Take 1,000 Units by mouth daily.   Yes [provider]  ezetimibe (ZETIA) 10 MG tablet TAKE 1 TABLET BY MOUTH DAILY 03/17/23  Yes Nafziger, Kandee Keen, NP  famotidine (PEPCID) 20  MG tablet Take 20 mg by mouth daily as needed for heartburn or indigestion.   Yes [provider]  gemfibrozil (LOPID) 600 MG tablet TAKE 1 TABLET BY MOUTH DAILY AT 2:00 P.M. Patient taking differently: Take 300 mg by mouth daily at 2 PM. 06/29/23  Yes Nafziger, Kandee Keen, NP  insulin aspart (NOVOLOG FLEXPEN) 100 UNIT/ML FlexPen Inject 15-35 Units into the skin 3 (three) times daily before meals. 08/09/22  Yes Carlus Pavlov, MD  insulin degludec (TRESIBA FLEXTOUCH) 100 UNIT/ML FlexTouch Pen Inject 56 Units into the skin 2 (two) times daily. 08/09/22  Yes Carlus Pavlov, MD  lisinopril (ZESTRIL) 20 MG tablet Take 20 mg by mouth daily.   Yes [provider]  METAMUCIL FIBER PO Take 1 Dose by mouth daily. 1 dose= 1 tablespoon   Yes [provider]  metoprolol tartrate (LOPRESSOR) 50 MG tablet TAKE 1 TABLET BY MOUTH DAILY 06/17/23  Yes Nafziger, Kandee Keen, NP  sodium bicarbonate 650 MG tablet Take 650 mg by mouth 2 (two) times daily.   Yes [provider]  Continuous Blood Gluc Receiver (FREESTYLE LIBRE 2 READER) DEVI Use as instructed 01/31/21   Carlus Pavlov, MD  Continuous Glucose Sensor (FREESTYLE LIBRE 2 SENSOR) MISC USE AND CHANGE EVERY 14 DAYS AS DIRECTED 06/15/23   Carlus Pavlov, MD  Insulin Pen Needle (TECHLITE PEN NEEDLES) 31G X 5 MM MISC Use as directed with insulin. 07/26/22   Carlus Pavlov, MD  NEEDLE, DISP, 30 G 30G X 1" MISC by Does not apply route as directed.    [provider]  Semaglutide, 1 MG/DOSE, 4 MG/3ML SOPN Inject 1 mg as directed once a week. Patient not taking: Reported on 08/09/2023 12/23/21   Carlus Pavlov, MD    Physical Exam: Vitals:   08/09/23 1104 08/09/23 1128 08/09/23 1522 08/09/23 1928  BP: 131/60  132/80 (!) 137/54  Pulse: 84  82 91  Resp: 16  20 18   Temp: 97.6 F (36.4 C)  97.8 F (36.6 C) 97.6 F (36.4 C)  TempSrc:   Oral Oral  SpO2: 97%  98% 97%  Weight:  106.8 kg  107.7 kg  Height:  5\' 8"  (1.727 m)      Constitutional: NAD, calm, comfortable Eyes: PERRL, lids and conjunctivae normal ENMT: Mucous membranes are moist. Posterior pharynx clear of any exudate or lesions.Normal dentition.  Neck: normal, supple, no masses, no thyromegaly Respiratory: clear to auscultation bilaterally, no wheezing, no crackles. Normal respiratory effort. No accessory muscle use.  Cardiovascular: Regular rate and rhythm, no murmurs / rubs / gallops. No extremity edema. 2+ pedal pulses. No carotid bruits.  Abdomen: no tenderness, no masses palpated. No hepatosplenomegaly. Bowel sounds  positive.  Musculoskeletal: no clubbing / cyanosis. Good ROM, no contractures. Normal muscle tone.  S/p right foot/second toe wound debridement in podiatry office today with dressing in place. Neurologic: CN 2-12 grossly intact. Sensation intact, DTR normal. Strength 5/5 in all 4.  Psychiatric: Normal judgment and insight. Alert and oriented x 3. Normal mood.  SKIN/catheters: Right second toe diabetic foot ulcer/wound-S/p debridement in podiatry office today with dressing in place.  Labs on Admission: I have personally reviewed following labs and imaging studies  CBC: Recent Labs  Lab 08/09/23 1130  WBC 13.8*  NEUTROABS 9.5*  HGB 13.4  HCT 42.1  MCV 91.1  PLT 309   Basic Metabolic Panel: Recent Labs  Lab 08/09/23 1130  NA 138  K 4.8  CL 106  CO2 23  GLUCOSE 155*  BUN  28*  CREATININE 2.34*  CALCIUM 9.5   GFR: Estimated Creatinine Clearance: 29.5 mL/min (A) (by C-G formula based on SCr of 2.34 mg/dL (H)). Recent Labs  Lab 08/09/23 1130 08/09/23 1145  WBC 13.8*  --   LATICACIDVEN  --  0.8   Liver Function Tests: Recent Labs  Lab 08/09/23 1130  AST 17  ALT 16  ALKPHOS 51  BILITOT 0.5  PROT 7.0  ALBUMIN 3.3*   No results for input(s): "LIPASE", "AMYLASE" in the last 168 hours. No results for input(s): "AMMONIA" in the last 168 hours. Coagulation Profile: No results for input(s): "INR", "PROTIME" in the last 168 hours. Cardiac Enzymes: No results for input(s): "CKTOTAL", "CKMB", "CKMBINDEX", "TROPONINI" in the last 168 hours. BNP (last 3 results) No results for input(s): "PROBNP" in the last 8760 hours. HbA1C: No results for input(s): "HGBA1C" in the last 72 hours. CBG: No results for input(s): "GLUCAP" in the last 168 hours. Lipid Profile: No results for input(s): "CHOL", "HDL", "LDLCALC", "TRIG", "CHOLHDL", "LDLDIRECT" in the last 72 hours. Thyroid Function Tests: No results for input(s): "TSH", "T4TOTAL", "FREET4", "T3FREE", "THYROIDAB" in the last 72  hours. Anemia Panel: No results for input(s): "VITAMINB12", "FOLATE", "FERRITIN", "TIBC", "IRON", "RETICCTPCT" in the last 72 hours. Urine analysis:    Component Value Date/Time   COLORURINE YELLOW 08/09/2023 1356   APPEARANCEUR CLEAR 08/09/2023 1356   LABSPEC 1.015 08/09/2023 1356   PHURINE 5.0 08/09/2023 1356   GLUCOSEU 50 (A) 08/09/2023 1356   HGBUR SMALL (A) 08/09/2023 1356   HGBUR negative 12/27/2008 0000   BILIRUBINUR NEGATIVE 08/09/2023 1356   BILIRUBINUR n 03/05/2016 0936   KETONESUR NEGATIVE 08/09/2023 1356   PROTEINUR 30 (A) 08/09/2023 1356   UROBILINOGEN 0.2 03/05/2016 0936   UROBILINOGEN 0.2 12/27/2008 0000   NITRITE NEGATIVE 08/09/2023 1356   LEUKOCYTESUR NEGATIVE 08/09/2023 1356    Radiological Exams on Admission: Personally reviewed  VAS [place] ABI WITH/WO TBI Result Date: 08/09/2023  LOWER EXTREMITY DOPPLER STUDY Patient Name:  Eric Choi  Date of Exam:   08/09/2023 Medical Rec #: [id number]       Accession #:    [id number] Date of Birth: 1940/07/15       Patient Gender: M Patient Age:   23 years Exam Location:  [facility] Procedure:      VAS [place] ABI WITH/WO TBI Referring Phys: BRITNI HENDERLY --------------------------------------------------------------------------------  Indications: Ulceration. High Risk Factors: Hypertension, hyperlipidemia, Diabetes, past history of                    smoking, prior MI, coronary artery disease.  Vascular Interventions: Hx of RT CEA (12/31/2020). Comparison Study: Previous exam 04/08/23 Performing Technologist: Ernestene Mention RVT/RDMS  Examination Guidelines: A complete evaluation includes at minimum, Doppler waveform signals and systolic blood pressure reading at the level of bilateral brachial, anterior tibial, and posterior tibial arteries, when vessel segments are accessible. Bilateral testing is considered an integral part of a complete examination. Photoelectric Plethysmograph (PPG) waveforms and toe systolic pressure readings are  included as required and additional duplex testing as needed. Limited examinations for reoccurring indications may be performed as noted.  ABI Findings: +---------+------------------+-----+----------+--------+ Right    Rt Pressure (mmHg)IndexWaveform  Comment  +---------+------------------+-----+----------+--------+ Brachial 159                    triphasic          +---------+------------------+-----+----------+--------+ PTA      95  0.56 monophasic         +---------+------------------+-----+----------+--------+ DP       80                0.47 monophasic         +---------+------------------+-----+----------+--------+ Great Toe27                0.16 Abnormal           +---------+------------------+-----+----------+--------+ +---------+------------------+-----+----------+-------+ Left     Lt Pressure (mmHg)IndexWaveform  Comment +---------+------------------+-----+----------+-------+ Brachial 169                    triphasic         +---------+------------------+-----+----------+-------+ PTA      140               0.83 biphasic          +---------+------------------+-----+----------+-------+ DP       95                0.56 monophasic        +---------+------------------+-----+----------+-------+ Great Toe60                0.36 Abnormal          +---------+------------------+-----+----------+-------+ +-------+-----------+-----------+------------+------------+ ABI/TBIToday's ABIToday's TBIPrevious ABIPrevious TBI +-------+-----------+-----------+------------+------------+ Right  0.56       0.16       0.68        0.41         +-------+-----------+-----------+------------+------------+ Left   0.83       0.36       0.85        0.58         +-------+-----------+-----------+------------+------------+  Right ABIs appear decreased. Bilateral TBIs appear decreased.  Summary: Right: Resting right ankle-brachial index indicates moderate  right lower extremity arterial disease. The right toe-brachial index is abnormal. Left: Resting left ankle-brachial index indicates mild left lower extremity arterial disease. The left toe-brachial index is abnormal. *See table(s) above for measurements and observations.  Electronically signed by Carolynn Sayers on 08/09/2023 at 3:01:55 PM.    Final    DG Foot Complete Right Result Date: 08/09/2023 CLINICAL DATA:  Second toe ulcer. EXAM: RIGHT FOOT COMPLETE - 3+ VIEW COMPARISON:  Radiographs of same day. FINDINGS: There is lytic destruction involving distal tuft of second distal phalanx consistent with osteomyelitis, with overlying soft tissue ulceration. Mild posterior calcaneal spurring is noted. Vascular calcifications are noted. No fracture or dislocation is noted. IMPRESSION: Lytic destruction involving distal tuft of second distal phalanx consistent with osteomyelitis with overlying soft tissue ulceration. Electronically Signed   By: Lupita Raider M.D.   On: 08/09/2023 15:01   DG Foot Complete Right Result Date: 08/09/2023 Please see detailed radiograph report in office note.      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  X-ray done in their office reported-Lytic destruction involving distal tuft of second distal phalanx consistent with osteomyelitis with overlying soft tissue ulceration. Podiatry referred patient for admission and continuation of IV antibiotics. Patient received IV Unasyn and 1 dose of IV vancomycin in  the ED.  Will continue IV Unasyn for  now.  Will obtain MRSA screen.  Not sure if wound culture sent from podiatry office at the time of debridement.  ABI from November showed moderate atherosclerotic disease.  Labs show WBC 13.8, hemoglobin A1c 7.4 on 08/04/2023.  Patient will need formal vascular surgery evaluation to ensure circulation if antibiotics unsuccessful and toe amputation considered.    2.  Uncontrolled diabetes with complications of CAD, CKD, PAD, retinopathy: Patient on variable amounts of Premeal insulin-15 units before breakfast, 30 units before lunch and 30 to 35 units before dinner (states has a sliding scale).  He also reports taking 56 units of long-acting insulin twice a day.  He admits to not being very strict with diabetic diet at home.  Patient advised of stricter diet while in the hospital and accordingly will reduce insulin regimen to 15 units Premeal dosage and 50 units of long-acting insulin at bedtime for now.  Will titrate per blood glucose checks.  Will have sliding scale insulin available for additional coverage.  Patient agreeable with this plan.  Hemoglobin A1c 7.4 few days back.  3.  CKD stage IIIb: Patient's creatinine currently at baseline 2.2-2.3.  Resume home medications.  4.  CAD, PAD-carotid stenosis: Resume home medications and consider vascular surgery consultation in a.m. if further surgical intervention planned by podiatry  5. Hypertension, hyperlipidemia: Resume home medications  DVT prophylaxis: Lovenox   Code Status: Full code    .Health care proxy would be his wife  Patient/Family Communication: Discussed with patient and all questions answered to satisfaction.  Consults called: Podiatry will follow along Admission status :I certify that at the point of admission it is my clinical judgment that the patient will require inpatient hospital care spanning beyond 2 midnights from the point of admission due to high intensity of service and high frequency of surveillance required.Inpatient  status is judged to be reasonable and necessary in order to provide the required intensity of service to ensure the patient's safety. The patient's presenting symptoms, physical exam findings, and initial radiographic and laboratory data in the context of their chronic comorbidities is felt to place them at high risk for further clinical deterioration. The following factors support the patient status of inpatient : Toe infection with osteomyelitis needing IV antibiotics     Alessandra Bevels MD [facility] Pager in [place]  If 7PM-7AM, please contact night-coverage [email or web address]   08/09/2023, 8:06 PM

## 2023-08-09 NOTE — ED Notes (Signed)
 ED TO INPATIENT HANDOFF REPORT  ED Nurse Name and Phone #: [place], [phone]  S Name/Age/Gender Eric Choi 83 y.o. male Room/Bed: 038C/038C  Code Status   Code Status: Full Code  Home/SNF/Other Home Patient oriented to: self, place, time, and situation Is this baseline? Yes   Triage Complete: Triage complete  Chief Complaint Wound infection [T14.8XXA, L08.9]  Triage Note Pt was seen at pcp this morning for wound on right foot and was told to come to hospital and be seen. Pt has sensation to foot and is ambulatory. PCP thinks wound is infected. Pt denies fever. Pt has 7/10 sharp intermittent pains to right foot. Foot is red and warm at base of toe. Pulse present   Allergies No Known Allergies  Level of Care/Admitting Diagnosis ED Disposition     ED Disposition  Admit   Condition  --   Comment  Hospital Area: [facility] [[id number]]  Level of Care: Med-Surg [16]  May admit patient to [facility] or [facility] if equivalent level of care is available:: Yes  Covid Evaluation: Asymptomatic - no recent exposure (last 10 days) testing not required  Diagnosis: Wound infection [981191]  Admitting Physician: Alessandra Bevels [[id number]]  Attending Physician: Alessandra Bevels [[id number]]  Certification:: I certify this patient will need inpatient services for at least 2 midnights  Expected Medical Readiness: 08/12/2023          B Medical/Surgery History Past Medical History:  Diagnosis Date   CAD (coronary artery disease)    Colon polyp    DIABETES MELLITUS, TYPE II 11/03/2006   Dyspnea    HYPERLIPIDEMIA 11/03/2006   HYPERTENSION 11/03/2006   Myocardial infarction [facility])    PAD (peripheral artery disease) (HCC)    Past Surgical History:  Procedure Laterality Date   ANGIOPLASTY  20 years ago   percutaneous transluminal    CATARACT EXTRACTION  2002, 2005   bilateral   ENDARTERECTOMY Right 12/31/2020   Procedure: RIGHT CAROTID ENDARTERECTOMY;  Surgeon:  Nada Libman, MD;  Location: [facility] OR;  Service: Vascular;  Laterality: Right;   PATCH ANGIOPLASTY Right 12/31/2020   Procedure: PATCH ANGIOPLASTY OF RIGHT CAROTID ARTERY USING XENOSURE BOVINE PERICARDIUM PATCH;  Surgeon: Nada Libman, MD;  Location: [facility] OR;  Service: Vascular;  Laterality: Right;     A IV Location/Drains/Wounds Patient Lines/Drains/Airways Status     Active Line/Drains/Airways     Name Placement date Placement time Site Days   Peripheral IV 08/09/23 20 G Anterior;Distal;Left;Upper Arm 08/09/23  1520  Arm  less than 1            Intake/Output Last 24 hours No intake or output data in the 24 hours ending 08/09/23 1833  Labs/Imaging Results for orders placed or performed during the hospital encounter of 08/09/23 (from the past 48 hours)  Comprehensive metabolic panel     Status: Abnormal   Collection Time: 08/09/23 11:30 AM  Result Value Ref Range   Sodium 138 135 - 145 mmol/L   Potassium 4.8 3.5 - 5.1 mmol/L   Chloride 106 98 - 111 mmol/L   CO2 23 22 - 32 mmol/L   Glucose, Bld 155 (H) 70 - 99 mg/dL    Comment: Glucose reference range applies only to samples taken after fasting for at least 8 hours.   BUN 28 (H) 8 - 23 mg/dL   Creatinine, Ser 7.84 (H) 0.61 - 1.24 mg/dL   Calcium 9.5 8.9 - 69.6 mg/dL   Total Protein 7.0 6.5 -  8.1 g/dL   Albumin 3.3 (L) 3.5 - 5.0 g/dL   AST 17 15 - 41 U/L   ALT 16 0 - 44 U/L   Alkaline Phosphatase 51 38 - 126 U/L   Total Bilirubin 0.5 0.0 - 1.2 mg/dL   GFR, Estimated 27 (L) >60 mL/min    Comment: (NOTE) Calculated using the CKD-EPI Creatinine Equation (2021)    Anion gap 9 5 - 15    Comment: Performed at [facility], [address], [place], [place] [address]  CBC with Differential     Status: Abnormal   Collection Time: 08/09/23 11:30 AM  Result Value Ref Range   WBC 13.8 (H) 4.0 - 10.5 K/uL   RBC 4.62 4.22 - 5.81 MIL/uL   Hemoglobin 13.4 13.0 - 17.0 g/dL   HCT 60.4 54.0 - 98.1 %   MCV 91.1 80.0 - 100.0  fL   MCH 29.0 26.0 - 34.0 pg   MCHC 31.8 30.0 - 36.0 g/dL   RDW 19.1 47.8 - 29.5 %   Platelets 309 150 - 400 K/uL   nRBC 0.0 0.0 - 0.2 %   Neutrophils Relative % 68 %   Neutro Abs 9.5 (H) 1.7 - 7.7 K/uL   Lymphocytes Relative 16 %   Lymphs Abs 2.2 0.7 - 4.0 K/uL   Monocytes Relative 11 %   Monocytes Absolute 1.5 (H) 0.1 - 1.0 K/uL   Eosinophils Relative 4 %   Eosinophils Absolute 0.5 0.0 - 0.5 K/uL   Basophils Relative 1 %   Basophils Absolute 0.1 0.0 - 0.1 K/uL   Immature Granulocytes 0 %   Abs Immature Granulocytes 0.04 0.00 - 0.07 K/uL    Comment: Performed at [facility], [address] [place], [place] [address]  I-Stat Lactic Acid, ED     Status: None   Collection Time: 08/09/23 11:45 AM  Result Value Ref Range   Lactic Acid, Venous 0.8 0.5 - 1.9 mmol/L  Urinalysis, w/ Reflex to Culture (Infection Suspected) -Urine, Clean Catch     Status: Abnormal   Collection Time: 08/09/23  1:56 PM  Result Value Ref Range   Specimen Source URINE, CLEAN CATCH    Color, Urine YELLOW YELLOW   APPearance CLEAR CLEAR   Specific Gravity, Urine 1.015 1.005 - 1.030   pH 5.0 5.0 - 8.0   Glucose, UA 50 (A) NEGATIVE mg/dL   Hgb urine dipstick SMALL (A) NEGATIVE   Bilirubin Urine NEGATIVE NEGATIVE   Ketones, ur NEGATIVE NEGATIVE mg/dL   Protein, ur 30 (A) NEGATIVE mg/dL   Nitrite NEGATIVE NEGATIVE   Leukocytes,Ua NEGATIVE NEGATIVE   RBC / HPF 0-5 0 - 5 RBC/hpf   WBC, UA 0-5 0 - 5 WBC/hpf    Comment:        Reflex urine culture not performed if WBC <=10, OR if Squamous epithelial cells >5. If Squamous epithelial cells >5 suggest recollection.    Bacteria, UA NONE SEEN NONE SEEN   Squamous Epithelial / HPF 0-5 0 - 5 /HPF   Mucus PRESENT     Comment: Performed at [facility], [address] [place], [place] [address]   VAS [place] ABI WITH/WO TBI Result Date: 08/09/2023  LOWER EXTREMITY DOPPLER STUDY Patient Name:  Eric Choi  Date of Exam:   08/09/2023 Medical Rec #:  [id number]       Accession #:    [id number] Date of Birth: 06/02/1940       Patient  Gender: M Patient Age:   18 years Exam Location:  [facility] Procedure:      VAS [place] ABI WITH/WO TBI Referring Phys: BRITNI HENDERLY --------------------------------------------------------------------------------  Indications: Ulceration. High Risk Factors: Hypertension, hyperlipidemia, Diabetes, past history of                    smoking, prior MI, coronary artery disease.  Vascular Interventions: Hx of RT CEA (12/31/2020). Comparison Study: Previous exam 04/08/23 Performing Technologist: Ernestene Mention RVT/RDMS  Examination Guidelines: A complete evaluation includes at minimum, Doppler waveform signals and systolic blood pressure reading at the level of bilateral brachial, anterior tibial, and posterior tibial arteries, when vessel segments are accessible. Bilateral testing is considered an integral part of a complete examination. Photoelectric Plethysmograph (PPG) waveforms and toe systolic pressure readings are included as required and additional duplex testing as needed. Limited examinations for reoccurring indications may be performed as noted.  ABI Findings: +---------+------------------+-----+----------+--------+ Right    Rt Pressure (mmHg)IndexWaveform  Comment  +---------+------------------+-----+----------+--------+ Brachial 159                    triphasic          +---------+------------------+-----+----------+--------+ PTA      95                0.56 monophasic         +---------+------------------+-----+----------+--------+ DP       80                0.47 monophasic         +---------+------------------+-----+----------+--------+ Great Toe27                0.16 Abnormal           +---------+------------------+-----+----------+--------+ +---------+------------------+-----+----------+-------+ Left     Lt Pressure (mmHg)IndexWaveform  Comment  +---------+------------------+-----+----------+-------+ Brachial 169                    triphasic         +---------+------------------+-----+----------+-------+ PTA      140               0.83 biphasic          +---------+------------------+-----+----------+-------+ DP       95                0.56 monophasic        +---------+------------------+-----+----------+-------+ Great Toe60                0.36 Abnormal          +---------+------------------+-----+----------+-------+ +-------+-----------+-----------+------------+------------+ ABI/TBIToday's ABIToday's TBIPrevious ABIPrevious TBI +-------+-----------+-----------+------------+------------+ Right  0.56       0.16       0.68        0.41         +-------+-----------+-----------+------------+------------+ Left   0.83       0.36       0.85        0.58         +-------+-----------+-----------+------------+------------+  Right ABIs appear decreased. Bilateral TBIs appear decreased.  Summary: Right: Resting right ankle-brachial index indicates moderate right lower extremity arterial disease. The right toe-brachial index is abnormal. Left: Resting left ankle-brachial index indicates mild left lower extremity arterial disease. The left toe-brachial index is abnormal. *See table(s) above for measurements and observations.  Electronically signed by Carolynn Sayers on 08/09/2023 at 3:01:55 PM.    Final    DG Foot Complete Right Result Date: 08/09/2023 CLINICAL DATA:  Second toe ulcer. EXAM: RIGHT FOOT COMPLETE - 3+ VIEW COMPARISON:  Radiographs of same day. FINDINGS: There is lytic destruction involving distal tuft of second distal phalanx consistent with osteomyelitis, with overlying soft tissue ulceration. Mild posterior calcaneal spurring is noted. Vascular calcifications are noted. No fracture or dislocation is noted. IMPRESSION: Lytic destruction involving distal tuft of second distal phalanx consistent with osteomyelitis with  overlying soft tissue ulceration. Electronically Signed   By: Lupita Raider M.D.   On: 08/09/2023 15:01   DG Foot Complete Right Result Date: 08/09/2023 Please see detailed radiograph report in office note.   Pending Labs Unresulted Labs (From admission, onward)     Start     Ordered   08/16/23 0500  Creatinine, serum  (enoxaparin (LOVENOX)    CrCl >/= 30 ml/min)  Weekly,   R     Comments: while on enoxaparin therapy    08/09/23 1823   08/09/23 1828  MRSA Next Gen by PCR, Nasal  Once,   R        08/09/23 1827   08/09/23 1821  CBC  (enoxaparin (LOVENOX)    CrCl >/= 30 ml/min)  Once,   R       Comments: Baseline for enoxaparin therapy IF NOT ALREADY DRAWN.  Notify MD if PLT < 100 K.    08/09/23 1823   08/09/23 1821  Creatinine, serum  (enoxaparin (LOVENOX)    CrCl >/= 30 ml/min)  Once,   R       Comments: Baseline for enoxaparin therapy IF NOT ALREADY DRAWN.    08/09/23 1823            Vitals/Pain Today's Vitals   08/09/23 1104 08/09/23 1128 08/09/23 1522  BP: 131/60  132/80  Pulse: 84  82  Resp: 16  20  Temp: 97.6 F (36.4 C)  97.8 F (36.6 C)  TempSrc:   Oral  SpO2: 97%  98%  Weight:  106.8 kg   Height:  5\' 8"  (1.727 m)   PainSc:  7      Isolation Precautions No active isolations  Medications Medications  aspirin EC tablet 81 mg (has no administration in time range)  atorvastatin (LIPITOR) tablet 40 mg (has no administration in time range)  ezetimibe (ZETIA) tablet 10 mg (has no administration in time range)  gemfibrozil (LOPID) tablet 300 mg (has no administration in time range)  metoprolol tartrate (LOPRESSOR) tablet 50 mg (has no administration in time range)  insulin aspart (NOVOLOG) FlexPen 10 Units (has no administration in time range)  insulin degludec (TRESIBA) 100 UNIT/ML FlexTouch Pen 40 Units (has no administration in time range)  famotidine (PEPCID) tablet 20 mg (has no administration in time range)  sodium bicarbonate tablet 650 mg (has no  administration in time range)  cholecalciferol (VITAMIN D3) 25 MCG (1000 UNIT) tablet 1,000 Units (has no administration in time range)  acetaminophen (TYLENOL) tablet 650 mg (has no administration in time range)    Or  acetaminophen (TYLENOL) suppository 650 mg (has no administration in time range)  ondansetron (ZOFRAN) tablet 4 mg (has no administration in time range)    Or  ondansetron (ZOFRAN) injection 4 mg (has no administration in time range)  albuterol (PROVENTIL) (2.5 MG/3ML) 0.083% nebulizer solution 2.5 mg (has no administration in time range)  albuterol (PROVENTIL) (2.5 MG/3ML) 0.083% nebulizer solution 2.5 mg (has no administration in time range)  enoxaparin (LOVENOX) injection 30 mg (has no administration in time range)  ampicillin-sulbactam (UNASYN) 1.5 g  in sodium chloride 0.9 % 100 mL IVPB (has no administration in time range)  Ampicillin-Sulbactam (UNASYN) 3 g in sodium chloride 0.9 % 100 mL IVPB (0 g Intravenous Stopped 08/09/23 1827)  vancomycin (VANCOREADY) IVPB 2000 mg/400 mL (0 mg Intravenous Stopped 08/09/23 1731)    Mobility walks     Focused Assessments wound   R Recommendations: See Admitting Provider Note  Report given to:   Additional Notes: Pt is Ao, walky/talky, has a boot to his right foot, able to verbalize needs, has a Dexicom and some insulin, wife left but may be a better historian, pt is a little hard of hearing

## 2023-08-10 ENCOUNTER — Inpatient Hospital Stay (HOSPITAL_COMMUNITY)

## 2023-08-10 DIAGNOSIS — M86171 Other acute osteomyelitis, right ankle and foot: Secondary | ICD-10-CM | POA: Diagnosis not present

## 2023-08-10 DIAGNOSIS — M869 Osteomyelitis, unspecified: Secondary | ICD-10-CM | POA: Diagnosis not present

## 2023-08-10 DIAGNOSIS — I70235 Atherosclerosis of native arteries of right leg with ulceration of other part of foot: Secondary | ICD-10-CM | POA: Diagnosis not present

## 2023-08-10 DIAGNOSIS — N189 Chronic kidney disease, unspecified: Secondary | ICD-10-CM

## 2023-08-10 DIAGNOSIS — I739 Peripheral vascular disease, unspecified: Secondary | ICD-10-CM

## 2023-08-10 LAB — GLUCOSE, CAPILLARY
Glucose-Capillary: 129 mg/dL — ABNORMAL HIGH (ref 70–99)
Glucose-Capillary: 125 mg/dL — ABNORMAL HIGH (ref 70–99)
Glucose-Capillary: 184 mg/dL — ABNORMAL HIGH (ref 70–99)
Glucose-Capillary: 177 mg/dL — ABNORMAL HIGH (ref 70–99)
Glucose-Capillary: 198 mg/dL — ABNORMAL HIGH (ref 70–99)

## 2023-08-10 MED ORDER — INSULIN ASPART 100 UNIT/ML IJ SOLN
2.0000 [IU] | Freq: Three times a day (TID) | INTRAMUSCULAR | Status: DC
  Administered 2023-08-10 – 2023-08-16 (×8): 2 [IU] via SUBCUTANEOUS

## 2023-08-10 MED ORDER — AMLODIPINE BESYLATE 5 MG PO TABS
5.0000 mg | ORAL_TABLET | Freq: Every day | ORAL | Status: DC
  Administered 2023-08-10 – 2023-08-16 (×7): 5 mg via ORAL
  Filled 2023-08-10 (×7): qty 1

## 2023-08-10 MED ORDER — INSULIN ASPART 100 UNIT/ML IJ SOLN
0.0000 [IU] | Freq: Three times a day (TID) | INTRAMUSCULAR | Status: DC
  Administered 2023-08-10 (×2): 2 [IU] via SUBCUTANEOUS
  Administered 2023-08-10: 1 [IU] via SUBCUTANEOUS
  Administered 2023-08-13 – 2023-08-15 (×4): 2 [IU] via SUBCUTANEOUS
  Administered 2023-08-15: 3 [IU] via SUBCUTANEOUS
  Administered 2023-08-16: 2 [IU] via SUBCUTANEOUS

## 2023-08-10 MED ORDER — SODIUM CHLORIDE 0.9 % IV SOLN: INTRAVENOUS | Status: AC

## 2023-08-10 MED ORDER — INSULIN GLARGINE-YFGN 100 UNIT/ML ~~LOC~~ SOLN
40.0000 [IU] | Freq: Two times a day (BID) | SUBCUTANEOUS | Status: DC
  Administered 2023-08-10 – 2023-08-12 (×4): 40 [IU] via SUBCUTANEOUS
  Filled 2023-08-10 (×8): qty 0.4

## 2023-08-10 MED ORDER — INSULIN ASPART 100 UNIT/ML IJ SOLN
0.0000 [IU] | Freq: Every day | INTRAMUSCULAR | Status: DC
  Administered 2023-08-15: 2 [IU] via SUBCUTANEOUS

## 2023-08-10 NOTE — Consult Note (Signed)
 PODIATRY CONSULTATION  NAME Eric Choi MRN [id number] DOB January 16, 1941 DOA 08/09/2023   Reason for consult:  Chief Complaint  Patient presents with   Wound Check    Attending/Consulting physician: A. David Stall MD  History of present illness: "83 y.o. male with history h/o hypertension, hyperlipidemia, CAD, PAD, uncontrolled diabetes  mellitus-insulin-dependent presents from podiatry office for right second toe infection.  Patient states about 3 weeks back he was seen in podiatry office for toenail clipping and apparently noted to have a dark spot in the nailbed.  After he went home he had some pain and last week he noted worsening pain-describes as sharp/stabbing, intermittent and 10/10 at its worst, associated with brownish drainage onto his sock that he noticed over the weekend and called podiatry office yesterday.  He was seen today in the clinic, underwent debridement of the foot ulcer which appeared to be infected and underwent x-ray->sent to the ED in concern for osteomyelitis.  Patient denies any fevers at home and was afebrile on presentation here.  Hemodynamically stable on arrival.  Received IV Unasyn and vancomycin in the ED.  Requested to be admitted for IV antibiotic treatment."  Pt known to Dr. Ardelle Anton. He reports pain in his right 2nd toe. Discussed plans for surgery he is aware.   Past Medical History:  Diagnosis Date   CAD (coronary artery disease)    Colon polyp    DIABETES MELLITUS, TYPE II 11/03/2006   Dyspnea    HYPERLIPIDEMIA 11/03/2006   HYPERTENSION 11/03/2006   Myocardial infarction [facility])    PAD (peripheral artery disease) (HCC)        Latest Ref Rng & Units 08/09/2023    8:03 PM 08/09/2023   11:30 AM 06/17/2023    7:39 AM  CBC  WBC 4.0 - 10.5 K/uL 12.2  13.8  10.9   Hemoglobin 13.0 - 17.0 g/dL 40.9  81.1  91.4   Hematocrit 39.0 - 52.0 % 39.8  42.1  42.9   Platelets 150 - 400 K/uL 279  309  317.0        Latest Ref Rng & Units 08/09/2023    8:03  PM 08/09/2023   11:30 AM 06/17/2023    7:39 AM  BMP  Glucose 70 - 99 mg/dL  782  956   BUN 8 - 23 mg/dL  28  28   Creatinine 2.13 - 1.24 mg/dL 0.86  5.78  4.69   Sodium 135 - 145 mmol/L  138  140   Potassium 3.5 - 5.1 mmol/L  4.8  5.1   Chloride 98 - 111 mmol/L  106  104   CO2 22 - 32 mmol/L  23  25   Calcium 8.9 - 10.3 mg/dL  9.5  9.5       Physical Exam: Lower Extremity Exam  Right foot erythema of 2nd toe extending to MPJ level. Pain on palpation. Non palpable DP and pt pulse RLE  ABI PVR Right: Resting right ankle-brachial index indicates moderate right lower  extremity arterial disease. The right toe-brachial index is abnormal.   Left: Resting left ankle-brachial index indicates mild left lower extremity arterial disease. The left toe-brachial index is abnormal.    ASSESSMENT/PLAN OF CARE 83 y.o. male with PMHx significant for  hypertension, hyperlipidemia CAD uncontrolled diabetes mellitus type 2 insulin-dependent  with ulceration of the right 2nd toe with underlying osteomyelitis  WBC 12.2 XR foot R: Lytic destruction involving distal tuft of second distal phalanx consistent with  osteomyelitis with  overlying soft tissue ulceration. MRI R foot: p  - Agree with vascular surgery consultation. Plan for R 2nd toe amp following  vascular intervention tmrw. Plan for OR Friday for toe amp. - Continue IV abx broad spectrum pending further culture data - Anticoagulation: Ok to continue per vascular/primary - Wound care: Betadine paint to the toe pre op - WB status: WBAT - Will continue to follow   Thank you for the consult.  Please contact me directly with any questions or concerns.           Corinna Gab, DPM [facility]    [address] [place], [place] [address]                Office [phone]  Fax [phone]

## 2023-08-10 NOTE — Plan of Care (Signed)
  Problem: Clinical Measurements: Goal: Cardiovascular complication will be avoided Outcome: Not Progressing   Problem: Clinical Measurements: Goal: Diagnostic test results will improve Outcome: Not Progressing   Problem: Activity: Goal: Risk for activity intolerance will decrease Outcome: Not Progressing   Problem: Nutrition: Goal: Adequate nutrition will be maintained Outcome: Not Progressing   Problem: Pain Managment: Goal: General experience of comfort will improve and/or be controlled Outcome: Not Progressing   Problem: Skin Integrity: Goal: Risk for impaired skin integrity will decrease Outcome: Not Progressing

## 2023-08-10 NOTE — Consult Note (Addendum)
 [facility] NOTE   MRN : [id number]  Reason for Consult: Non healing second toe wound right LE Referring Physician: Patricia Pesa  History of Present Illness: 83 y/o male who is a patient of ours followed for Carotid stenosis s/p right CEA by DR. Mackinley Cassaday 12/31/20.  He was last seen on 08/23/22 and his duplex was stable right ICA < 39% stenosis and left < 59% stenosis.    He presented to the [facility] ED with non healing right second toe wound with signs of infection followed by Dr. Patricia Pesa.  We have been asked to maximize his inflow to assist with healing after planned second toe amputation in the near future by DPM.   He states the wound started 3 weeks ago after nail trimming.  He called the Dr. Gabriel Rung office and they instructed him to go to [facility] for admission and IV antibiotics.     Past medical history: hypertension, hyperlipidemia, CAD, PAD, uncontrolled diabetes mellitus-insulin-dependent, Carotid stenosis and PAD.         Current Facility-Administered Medications  Medication Dose Route Frequency Provider Last Rate Last Admin   acetaminophen (TYLENOL) tablet 650 mg  650 mg Oral Q6H PRN Alessandra Bevels, MD   650 mg at 08/09/23 2207   Or   acetaminophen (TYLENOL) suppository 650 mg  650 mg Rectal Q6H PRN Alessandra Bevels, MD       albuterol (PROVENTIL) (2.5 MG/3ML) 0.083% nebulizer solution 2.5 mg  2.5 mg Nebulization Q2H PRN Alessandra Bevels, MD       amLODipine (NORVASC) tablet 5 mg  5 mg Oral Daily Marinda Elk, MD       ampicillin-sulbactam (UNASYN) 1.5 g in sodium chloride 0.9 % 100 mL IVPB  1.5 g Intravenous Q8H Francena Hanly, RPH 200 mL/hr at 08/10/23 0217 1.5 g at 08/10/23 0217   aspirin EC tablet 81 mg  81 mg Oral Daily Kamineni, Sallye Ober, MD       atorvastatin (LIPITOR) tablet 40 mg  40 mg Oral Daily Kamineni, Sallye Ober, MD       cholecalciferol (VITAMIN D3) 25 MCG (1000 UNIT) tablet 1,000 Units  1,000 Units Oral Daily  Kamineni, Neelima, MD       enoxaparin (LOVENOX) injection 30 mg  30 mg Subcutaneous Q24H Alessandra Bevels, MD   30 mg at 08/09/23 2017   ezetimibe (ZETIA) tablet 10 mg  10 mg Oral Daily Alessandra Bevels, MD       famotidine (PEPCID) tablet 20 mg  20 mg Oral Daily PRN Alessandra Bevels, MD       gemfibrozil (LOPID) tablet 300 mg  300 mg Oral Q1400 Alessandra Bevels, MD   300 mg at 08/09/23 2017   insulin aspart (novoLOG) injection 0-5 Units  0-5 Units Subcutaneous QHS Marinda Elk, MD       insulin aspart (novoLOG) injection 0-9 Units  0-9 Units Subcutaneous TID WC Marinda Elk, MD       insulin aspart (novoLOG) injection 2 Units  2 Units Subcutaneous TID WC Marinda Elk, MD       insulin glargine-yfgn [facility]) injection 40 Units  40 Units Subcutaneous BID Marinda Elk, MD       metoprolol tartrate (LOPRESSOR) tablet 50 mg  50 mg Oral Daily Kamineni, Neelima, MD       ondansetron (ZOFRAN) tablet 4 mg  4 mg Oral Q6H PRN Alessandra Bevels, MD       Or   ondansetron (ZOFRAN) injection 4  mg  4 mg Intravenous Q6H PRN Alessandra Bevels, MD       sodium bicarbonate tablet 650 mg  650 mg Oral BID Alessandra Bevels, MD   650 mg at 08/09/23 2017    Pt meds include: Statin :Yes Betablocker: Yes ASA: Yes Other anticoagulants/antiplatelets: None  Past Medical History:  Diagnosis Date   CAD (coronary artery disease)    Colon polyp    DIABETES MELLITUS, TYPE II 11/03/2006   Dyspnea    HYPERLIPIDEMIA 11/03/2006   HYPERTENSION 11/03/2006   Myocardial infarction [facility])    PAD (peripheral artery disease) (HCC)     Past Surgical History:  Procedure Laterality Date   ANGIOPLASTY  20 years ago   percutaneous transluminal    CATARACT EXTRACTION  2002, 2005   bilateral   ENDARTERECTOMY Right 12/31/2020   Procedure: RIGHT CAROTID ENDARTERECTOMY;  Surgeon: Nada Libman, MD;  Location: [facility] OR;  Service: Vascular;  Laterality: Right;   PATCH ANGIOPLASTY Right  12/31/2020   Procedure: PATCH ANGIOPLASTY OF RIGHT CAROTID ARTERY USING XENOSURE BOVINE PERICARDIUM PATCH;  Surgeon: Nada Libman, MD;  Location: [facility] OR;  Service: Vascular;  Laterality: Right;    Social History Social History   Tobacco Use   Smoking status: Former    Current packs/day: 0.00    Types: Cigarettes    Quit date: 05/18/1991    Years since quitting: 32.2    Passive exposure: Never   Smokeless tobacco: Never  Vaping Use   Vaping status: Never Used  Substance Use Topics   Alcohol use: No   Drug use: No    Family History Family History  Problem Relation Age of Onset   COPD Father    Alcohol abuse Father    Diabetes Father    Cancer Sister        breast   Stroke Mother    Colon cancer Neg Hx     No Known Allergies   REVIEW OF SYSTEMS  General: [ ]  Weight loss, [ ]  Fever, [ ]  chills Neurologic: [ ]  Dizziness, [ ]  Blackouts, [ ]  Seizure [ ]  Stroke, [ ]  "Mini stroke", [ ]  Slurred speech, [ ]  Temporary blindness; [ ]  weakness in arms or legs, [ ]  Hoarseness [ ]  Dysphagia Cardiac: [ ]  Chest pain/pressure, [ ]  Shortness of breath at rest [ ]  Shortness of breath with exertion, [ ]  Atrial fibrillation or irregular heartbeat  Vascular: [ ]  Pain in legs with walking, [ ]  Pain in legs at rest, [ ]  Pain in legs at night,  [ x] Non-healing ulcer, [ ]  Blood clot in vein/DVT,   Pulmonary: [ ]  Home oxygen, [ ]  Productive cough, [ ]  Coughing up blood, [ ]  Asthma,  [ ]  Wheezing [ ]  COPD Musculoskeletal:  [ ]  Arthritis, [ ]  Low back pain, [ ]  Joint pain Hematologic: [ ]  Easy Bruising, [ ]  Anemia; [ ]  Hepatitis Gastrointestinal: [ ]  Blood in stool, [ ]  Gastroesophageal Reflux/heartburn, Urinary: [ x] chronic Kidney disease, [ ]  on HD - [ ]  MWF or [ ]  TTHS, [ ]  Burning with urination, [ ]  Difficulty urinating Skin: [ ]  Rashes, [ ]  Wounds Psychological: [ ]  Anxiety, [ ]  Depression  Physical Examination Vitals:   08/09/23 1128 08/09/23 1522 08/09/23 1928 08/10/23 0236  BP:   132/80 (!) 137/54 (!) 142/67  Pulse:  82 91 82  Resp:  20 18 18   Temp:  97.8 F (36.6 C) 97.6 F (36.4 C) 98 F (36.7 C)  TempSrc:  Oral Oral Oral  SpO2:  98% 97% 98%  Weight: 106.8 kg  107.7 kg   Height: 5\' 8"  (1.727 m)      Body mass index is 36.1 kg/m.  General:  WDWN in NAD Gait: Normal HENT: WNL Eyes: Pupils equal Pulmonary: normal non-labored breathing , without Rales, rhonchi,  wheezing Cardiac: RRR, without  Murmurs, rubs or gallops; No carotid bruits Abdomen: soft, NT, no masses Skin:     Vascular Exam/Pulses:weakly palpable femoral pulses, no palpable pedal pulses   Musculoskeletal: no muscle wasting or atrophy; no edema  Neurologic: A&O X 3; Appropriate Affect ;  SENSATION: normal; MOTOR FUNCTION: 5/5 Symmetric Speech is fluent/normal   Significant Diagnostic Studies: CBC Lab Results  Component Value Date   WBC 12.2 (H) 08/09/2023   HGB 13.1 08/09/2023   HCT 39.8 08/09/2023   MCV 88.1 08/09/2023   PLT 279 08/09/2023    BMET    Component Value Date/Time   NA 138 08/09/2023 1130   NA 137 10/29/2020 0000   K 4.8 08/09/2023 1130   CL 106 08/09/2023 1130   CO2 23 08/09/2023 1130   GLUCOSE 155 (H) 08/09/2023 1130   BUN 28 (H) 08/09/2023 1130   CREATININE 2.34 (H) 08/09/2023 2003   CALCIUM 9.5 08/09/2023 1130   GFRNONAA 27 (L) 08/09/2023 2003   Estimated Creatinine Clearance: 29.5 mL/min (A) (by C-G formula based on SCr of 2.34 mg/dL (H)).  COAG Lab Results  Component Value Date   INR 1.1 12/26/2020     Non-Invasive Vascular Imaging:     ABI Findings:  +---------+------------------+-----+----------+--------+  Right   Rt Pressure (mmHg)IndexWaveform  Comment   +---------+------------------+-----+----------+--------+  Brachial 159                    triphasic           +---------+------------------+-----+----------+--------+  PTA     95                0.56 monophasic           +---------+------------------+-----+----------+--------+  DP      80                0.47 monophasic          +---------+------------------+-----+----------+--------+  Great Toe27                0.16 Abnormal            +---------+------------------+-----+----------+--------+   +---------+------------------+-----+----------+-------+  Left    Lt Pressure (mmHg)IndexWaveform  Comment  +---------+------------------+-----+----------+-------+  Brachial 169                    triphasic          +---------+------------------+-----+----------+-------+  PTA     140               0.83 biphasic           +---------+------------------+-----+----------+-------+  DP      95                0.56 monophasic         +---------+------------------+-----+----------+-------+  Great Toe60                0.36 Abnormal           +---------+------------------+-----+----------+-------+   +-------+-----------+-----------+------------+------------+  ABI/TBIToday's ABIToday's TBIPrevious ABIPrevious TBI  +-------+-----------+-----------+------------+------------+  Right 0.56       0.16       0.68  0.41          +-------+-----------+-----------+------------+------------+  Left  0.83       0.36       0.85        0.58          +-------+-----------+-----------+------------+------------+        Right ABIs appear decreased. Bilateral TBIs appear decreased.    Summary:  Right: Resting right ankle-brachial index indicates moderate right lower  extremity arterial disease. The right toe-brachial index is abnormal.   Left: Resting left ankle-brachial index indicates mild left lower  extremity arterial disease. The left toe-brachial index is abnormal.   X ray right foot FINDINGS: There is lytic destruction involving distal tuft of second distal phalanx consistent with osteomyelitis, with overlying soft tissue ulceration. Mild posterior calcaneal  spurring is noted. Vascular calcifications are noted. No fracture or dislocation is noted.   IMPRESSION: Lytic destruction involving distal tuft of second distal phalanx consistent with osteomyelitis with overlying soft tissue ulceration.   ASSESSMENT/PLAN:  PAD with tissue loss  Infected toe ulcer with suspected osteomyelitis  Of note he has CKD with a Cr of 2.34  He would benefit from angiogram with limited contrast use  verses Co2 and possible intervention right LE.  I will start gentle hydration with NS @ 75 and plan angiogram tomorrow, NPO order placed.  PAD with significant decreased TBI on the right.  ABI monophasic wave forms index of 0.56.  He is diabetic.   Leukocytosis 12.2 managed with IV Unasyn.     Mosetta Pigeon 08/10/2023 7:38 AM  I agree with the above.  I have seen and evaluated the patient.  I have previously seen the patient for claudication, which has been treated medically.  He did try cilostazol but had to stop this secondary to diarrhea.  I have performed right carotid endarterectomy for asymptomatic stenosis in 2022.  He now has a right second toe wound.  He was admitted for IV antibiotics and pain control.  Dr. Annamary Rummage is planning on right second toe amputation.  He had ABIs that were 0.56 on the right and 0.83 on the left.  The right toe pressure was 27.  He had monophasic waveforms on the right and biphasic on the left.  On exam, I felt a faint left femoral pulse.  I could not palpate a right femoral pulse however this may be due to the patient's positioning and soft tissue.  I discussed proceeding with angiography to evaluate his vascular disease and see if he is a candidate for intervention.  This will be through a left femoral approach.  He does have chronic renal insufficiency.  His creatinine today is 2.34 which is around his baseline.  I told him that we will try to use as little dye as possible to minimize his risk for worsening renal disease.  We  will try to do this with CO2 if at all possible.  He understands this and wishes to proceed.  He will be n.p.o. after midnight.  Durene Cal

## 2023-08-10 NOTE — Progress Notes (Signed)
 [facility] PROGRESS NOTE    Progress Note  Eric Choi  [name] DOB: 09-05-1940 DOA: 08/09/2023 PCP: Shirline Frees, NP     Brief Narrative:   Eric Choi is an 83 y.o. male past medical history of essential hypertension, hyperlipidemia CAD uncontrolled diabetes mellitus type 2 insulin-dependent sent from the podiatrist office due to right second toe infection and osteomyelitis, care of the foot confirmed lytic lesion of the distal tuft of the second phalange.    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 X-ray done here still shows osteomyelitis. Started empirically IV vancomycin and Unasyn. ABIs showed moderate atherosclerotic disease. Check an MRI of the foot. Consult vascular surgery. Awaiting podiatry further recommendations.  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    Chronic kidney disease stage IIIb: His creatinine appears to be at baseline.  Continue bicarbonate tabs.  CAD/PAD/hyperlipidemia: Continue aspirin and fenofibrate.  Essential hypertension: Hold ACE inhibitor start Norvasc at a lower dose. Continue monitoring blood pressure.   DVT prophylaxis: Lovenox Family Communication:none Status is: Inpatient Remains inpatient appropriate because: Right foot osteomyelitis    Code Status:     Code Status Orders  (From admission, onward)           Start     Ordered   08/09/23 1822  Full code  Continuous       Question:  By:  Answer:  Consent: discussion documented in EHR   08/09/23 1823           Code Status History     Date Active Date Inactive Code Status Order ID Comments User Context   12/31/2020 1742 01/01/2021 1848 Full Code [id number]  Milinda Antis, PA-C Inpatient         IV Access:   Peripheral IV   Procedures and diagnostic studies:   VAS [place] ABI WITH/WO TBI Result Date: 08/09/2023  LOWER EXTREMITY DOPPLER STUDY Patient Name:  Eric Choi  Date of Exam:   08/09/2023 Medical Rec #: [id number]       Accession #:    [id number] Date of Birth: 16-Jul-1940       Patient Gender: M Patient Age:   11 years Exam Location:  [facility] Procedure:      VAS [place] ABI WITH/WO TBI Referring Phys: BRITNI HENDERLY --------------------------------------------------------------------------------  Indications: Ulceration. High Risk Factors: Hypertension, hyperlipidemia, Diabetes, past history of                    smoking, prior MI, coronary artery disease.  Vascular Interventions: Hx of RT CEA (12/31/2020). Comparison Study: Previous exam 04/08/23 Performing Technologist: Ernestene Mention RVT/RDMS  Examination Guidelines: A complete evaluation includes at minimum, Doppler waveform signals and systolic blood pressure reading at the level of bilateral brachial, anterior tibial, and posterior tibial arteries, when vessel segments are accessible. Bilateral testing is considered an integral part of a complete examination. Photoelectric Plethysmograph (PPG) waveforms and toe systolic pressure readings are included as required and additional duplex testing as needed. Limited examinations for reoccurring indications may be performed as noted.  ABI Findings: +---------+------------------+-----+----------+--------+ Right    Rt Pressure (mmHg)IndexWaveform  Comment  +---------+------------------+-----+----------+--------+ Brachial 159                    triphasic          +---------+------------------+-----+----------+--------+  PTA      95                0.56 monophasic         +---------+------------------+-----+----------+--------+ DP       80                0.47 monophasic         +---------+------------------+-----+----------+--------+  Great Toe27                0.16 Abnormal           +---------+------------------+-----+----------+--------+ +---------+------------------+-----+----------+-------+ Left     Lt Pressure (mmHg)IndexWaveform  Comment +---------+------------------+-----+----------+-------+ Brachial 169                    triphasic         +---------+------------------+-----+----------+-------+ PTA      140               0.83 biphasic          +---------+------------------+-----+----------+-------+ DP       95                0.56 monophasic        +---------+------------------+-----+----------+-------+ Great Toe60                0.36 Abnormal          +---------+------------------+-----+----------+-------+ +-------+-----------+-----------+------------+------------+ ABI/TBIToday's ABIToday's TBIPrevious ABIPrevious TBI +-------+-----------+-----------+------------+------------+ Right  0.56       0.16       0.68        0.41         +-------+-----------+-----------+------------+------------+ Left   0.83       0.36       0.85        0.58         +-------+-----------+-----------+------------+------------+  Right ABIs appear decreased. Bilateral TBIs appear decreased.  Summary: Right: Resting right ankle-brachial index indicates moderate right lower extremity arterial disease. The right toe-brachial index is abnormal. Left: Resting left ankle-brachial index indicates mild left lower extremity arterial disease. The left toe-brachial index is abnormal. *See table(s) above for measurements and observations.  Electronically signed by Carolynn Sayers on 08/09/2023 at 3:01:55 PM.    Final    DG Foot Complete Right Result Date: 08/09/2023 CLINICAL DATA:  Second toe ulcer. EXAM: RIGHT FOOT COMPLETE - 3+ VIEW COMPARISON:  Radiographs of same day. FINDINGS: There is lytic destruction involving distal tuft of second distal phalanx consistent with osteomyelitis, with overlying soft tissue ulceration.  Mild posterior calcaneal spurring is noted. Vascular calcifications are noted. No fracture or dislocation is noted. IMPRESSION: Lytic destruction involving distal tuft of second distal phalanx consistent with osteomyelitis with overlying soft tissue ulceration. Electronically Signed   By: Lupita Raider M.D.   On: 08/09/2023 15:01   DG Foot Complete Right Result Date: 08/09/2023 Please see detailed radiograph report in office note.    Medical Consultants:   None.   Subjective:    [name] relates no pain.  Objective:    Vitals:   08/09/23 1128 08/09/23 1522 08/09/23 1928 08/10/23 0236  BP:  132/80 (!) 137/54 (!) 142/67  Pulse:  82 91 82  Resp:  20 18 18   Temp:  97.8 F (36.6 C) 97.6 F (36.4 C) 98 F (36.7 C)  TempSrc:  Oral Oral Oral  SpO2:  98% 97% 98%  Weight: 106.8 kg  107.7 kg   Height: 5\' 8"  (1.727 m)      SpO2:  98 %  No intake or output data in the 24 hours ending 08/10/23 2951 [facility] Weights   08/09/23 1128 08/09/23 1928  Weight: 106.8 kg 107.7 kg    Exam: General exam: In no acute distress. Respiratory system: Good air movement and clear to auscultation. Cardiovascular system: S1 & S2 heard, RRR. No JVD. Gastrointestinal system: Abdomen is nondistended, soft and nontender.  Extremities: Right foot dressing Skin: No rashes, lesions or ulcers Psychiatry: Judgement and insight appear normal. Mood & affect appropriate.    Data Reviewed:    Labs: Basic Metabolic Panel: Recent Labs  Lab 08/09/23 1130 08/09/23 2003  NA 138  --   K 4.8  --   CL 106  --   CO2 23  --   GLUCOSE 155*  --   BUN 28*  --   CREATININE 2.34* 2.34*  CALCIUM 9.5  --    GFR Estimated Creatinine Clearance: 29.5 mL/min (A) (by C-G formula based on SCr of 2.34 mg/dL (H)). Liver Function Tests: Recent Labs  Lab 08/09/23 1130  AST 17  ALT 16  ALKPHOS 51  BILITOT 0.5  PROT 7.0  ALBUMIN 3.3*   No results for input(s): "LIPASE", "AMYLASE" in the last 168  hours. No results for input(s): "AMMONIA" in the last 168 hours. Coagulation profile No results for input(s): "INR", "PROTIME" in the last 168 hours. COVID-19 Labs  No results for input(s): "DDIMER", "FERRITIN", "LDH", "CRP" in the last 72 hours.  Lab Results  Component Value Date   SARSCOV2NAA RESULT: NEGATIVE 12/29/2020    CBC: Recent Labs  Lab 08/09/23 1130 08/09/23 2003  WBC 13.8* 12.2*  NEUTROABS 9.5*  --   HGB 13.4 13.1  HCT 42.1 39.8  MCV 91.1 88.1  PLT 309 279   Cardiac Enzymes: No results for input(s): "CKTOTAL", "CKMB", "CKMBINDEX", "TROPONINI" in the last 168 hours. BNP (last 3 results) No results for input(s): "PROBNP" in the last 8760 hours. CBG: Recent Labs  Lab 08/09/23 2128 08/09/23 2207 08/10/23 0241 08/10/23 0611  GLUCAP 68* 106* 129* 125*   D-Dimer: No results for input(s): "DDIMER" in the last 72 hours. Hgb A1c: No results for input(s): "HGBA1C" in the last 72 hours. Lipid Profile: No results for input(s): "CHOL", "HDL", "LDLCALC", "TRIG", "CHOLHDL", "LDLDIRECT" in the last 72 hours. Thyroid function studies: No results for input(s): "TSH", "T4TOTAL", "T3FREE", "THYROIDAB" in the last 72 hours.  Invalid input(s): "FREET3" Anemia work up: No results for input(s): "VITAMINB12", "FOLATE", "FERRITIN", "TIBC", "IRON", "RETICCTPCT" in the last 72 hours. Sepsis Labs: Recent Labs  Lab 08/09/23 1130 08/09/23 1145 08/09/23 2003  WBC 13.8*  --  12.2*  LATICACIDVEN  --  0.8  --    Microbiology Recent Results (from the past 240 hours)  MRSA Next Gen by PCR, Nasal     Status: None   Collection Time: 08/09/23  7:44 PM   Specimen: Nasal Mucosa; Nasal Swab  Result Value Ref Range Status   MRSA by PCR Next Gen NOT DETECTED NOT DETECTED Final    Comment: (NOTE) The GeneXpert MRSA Assay (FDA approved for NASAL specimens only), is one component of a comprehensive MRSA colonization surveillance program. It is not intended to diagnose MRSA infection  nor to guide or monitor treatment for MRSA infections. Test performance is not FDA approved in patients less than 43 years old. Performed at [facility], [address] [place], [place] [address]      Medications:    aspirin EC  81 mg Oral  Daily   atorvastatin  40 mg Oral Daily   cholecalciferol  1,000 Units Oral Daily   enoxaparin (LOVENOX) injection  30 mg Subcutaneous Q24H   ezetimibe  10 mg Oral Daily   gemfibrozil  300 mg Oral Q1400   insulin aspart  10 Units Subcutaneous TID AC   insulin glargine-yfgn  50 Units Subcutaneous QHS   metoprolol tartrate  50 mg Oral Daily   sodium bicarbonate  650 mg Oral BID   Continuous Infusions:  ampicillin-sulbactam (UNASYN) IV 1.5 g (08/10/23 0217)      LOS: 1 day   Marinda Elk  [facility]  08/10/2023, 6:52 AM

## 2023-08-10 NOTE — Plan of Care (Signed)

## 2023-08-11 ENCOUNTER — Ambulatory Visit (HOSPITAL_COMMUNITY): Admission: RE | Admit: 2023-08-11 | Source: Home / Self Care | Admitting: Vascular Surgery

## 2023-08-11 ENCOUNTER — Encounter (HOSPITAL_COMMUNITY)
Admission: EM | Disposition: A | Discharge status: Home / Self Care | Payer: Self-pay | Source: Ambulatory Visit | Attending: Internal Medicine

## 2023-08-11 DIAGNOSIS — I739 Peripheral vascular disease, unspecified: Secondary | ICD-10-CM | POA: Diagnosis not present

## 2023-08-11 DIAGNOSIS — M869 Osteomyelitis, unspecified: Secondary | ICD-10-CM | POA: Diagnosis not present

## 2023-08-11 DIAGNOSIS — I70235 Atherosclerosis of native arteries of right leg with ulceration of other part of foot: Secondary | ICD-10-CM

## 2023-08-11 DIAGNOSIS — N189 Chronic kidney disease, unspecified: Secondary | ICD-10-CM | POA: Diagnosis not present

## 2023-08-11 DIAGNOSIS — M86171 Other acute osteomyelitis, right ankle and foot: Secondary | ICD-10-CM | POA: Diagnosis not present

## 2023-08-11 HISTORY — PX: LOWER EXTREMITY INTERVENTION: CATH118252

## 2023-08-11 HISTORY — PX: LOWER EXTREMITY ANGIOGRAPHY: CATH118251

## 2023-08-11 HISTORY — PX: ABDOMINAL AORTOGRAM: CATH118222

## 2023-08-11 LAB — GLUCOSE, CAPILLARY
Glucose-Capillary: 71 mg/dL (ref 70–99)
Glucose-Capillary: 55 mg/dL — ABNORMAL LOW (ref 70–99)
Glucose-Capillary: 58 mg/dL — ABNORMAL LOW (ref 70–99)
Glucose-Capillary: 264 mg/dL — ABNORMAL HIGH (ref 70–99)
Glucose-Capillary: 96 mg/dL (ref 70–99)
Glucose-Capillary: 101 mg/dL — ABNORMAL HIGH (ref 70–99)
Glucose-Capillary: 172 mg/dL — ABNORMAL HIGH (ref 70–99)

## 2023-08-11 LAB — POCT I-STAT, CHEM 8
BUN: 24 mg/dL — ABNORMAL HIGH (ref 8–23)
Calcium, Ion: 1.29 mmol/L (ref 1.15–1.40)
Chloride: 106 mmol/L (ref 98–111)
Creatinine, Ser: 2 mg/dL — ABNORMAL HIGH (ref 0.61–1.24)
Glucose, Bld: 108 mg/dL — ABNORMAL HIGH (ref 70–99)
HCT: 41 % (ref 39.0–52.0)
Hemoglobin: 13.9 g/dL (ref 13.0–17.0)
Potassium: 4.3 mmol/L (ref 3.5–5.1)
Sodium: 139 mmol/L (ref 135–145)
TCO2: 21 mmol/L — ABNORMAL LOW (ref 22–32)

## 2023-08-11 LAB — BASIC METABOLIC PANEL WITH GFR
Anion gap: 10 (ref 5–15)
BUN: 27 mg/dL — ABNORMAL HIGH (ref 8–23)
CO2: 21 mmol/L — ABNORMAL LOW (ref 22–32)
Calcium: 9.4 mg/dL (ref 8.9–10.3)
Chloride: 108 mmol/L (ref 98–111)
Creatinine, Ser: 2.13 mg/dL — ABNORMAL HIGH (ref 0.61–1.24)
GFR, Estimated: 31 mL/min — ABNORMAL LOW (ref >60)
Glucose, Bld: 79 mg/dL (ref 70–99)
Potassium: 4.4 mmol/L (ref 3.5–5.1)
Sodium: 139 mmol/L (ref 135–145)

## 2023-08-11 LAB — POCT ACTIVATED CLOTTING TIME: Activated Clotting Time: 158 s

## 2023-08-11 SURGERY — ABDOMINAL AORTOGRAM
Anesthesia: LOCAL | Laterality: Right

## 2023-08-11 MED ORDER — SODIUM CHLORIDE 0.9 % IV SOLN: INTRAVENOUS | Status: AC

## 2023-08-11 MED ORDER — CLOPIDOGREL BISULFATE 300 MG PO TABS
ORAL_TABLET | ORAL | Status: DC | PRN
  Administered 2023-08-11: 300 mg via ORAL

## 2023-08-11 MED ORDER — SODIUM CHLORIDE 0.9 % IV SOLN: 250.0000 mL | INTRAVENOUS | Status: DC | PRN

## 2023-08-11 MED ORDER — SODIUM CHLORIDE 0.9% FLUSH
3.0000 mL | Freq: Two times a day (BID) | INTRAVENOUS | Status: DC
  Administered 2023-08-11 – 2023-08-15 (×7): 3 mL via INTRAVENOUS

## 2023-08-11 MED ORDER — SODIUM CHLORIDE 0.9% FLUSH: 3.0000 mL | INTRAVENOUS | Status: DC | PRN

## 2023-08-11 MED ORDER — ATROPINE SULFATE 1 MG/10ML IJ SOSY
PREFILLED_SYRINGE | INTRAMUSCULAR | Status: AC
  Filled 2023-08-11: qty 10

## 2023-08-11 MED ORDER — ENOXAPARIN SODIUM 60 MG/0.6ML IJ SOSY: 50.0000 mg | PREFILLED_SYRINGE | INTRAMUSCULAR | Status: DC

## 2023-08-11 MED ORDER — DEXTROSE 50 % IV SOLN
INTRAVENOUS | Status: AC
  Administered 2023-08-11: 50 mL
  Filled 2023-08-11: qty 50

## 2023-08-11 MED ORDER — HEPARIN SODIUM (PORCINE) 1000 UNIT/ML IJ SOLN
INTRAMUSCULAR | Status: DC | PRN
  Administered 2023-08-11: 10000 [IU] via INTRAVENOUS

## 2023-08-11 MED ORDER — FENTANYL CITRATE (PF) 100 MCG/2ML IJ SOLN
INTRAMUSCULAR | Status: DC | PRN
  Administered 2023-08-11: 50 ug via INTRAVENOUS
  Administered 2023-08-11: 25 ug via INTRAVENOUS

## 2023-08-11 MED ORDER — CLOPIDOGREL BISULFATE 300 MG PO TABS
ORAL_TABLET | ORAL | Status: AC
  Filled 2023-08-11: qty 1

## 2023-08-11 MED ORDER — GLUCOSE 40 % PO GEL
1.0000 | Freq: Once | ORAL | Status: AC
  Administered 2023-08-11: 31 g via ORAL

## 2023-08-11 MED ORDER — GLUCOSE 40 % PO GEL
ORAL | Status: AC
  Filled 2023-08-11: qty 1.21

## 2023-08-11 MED ORDER — SODIUM CHLORIDE 0.9 % IV SOLN
100.0000 mg | Freq: Two times a day (BID) | INTRAVENOUS | Status: DC
  Administered 2023-08-11 – 2023-08-15 (×10): 100 mg via INTRAVENOUS
  Filled 2023-08-11 (×12): qty 100

## 2023-08-11 MED ORDER — FENTANYL CITRATE (PF) 100 MCG/2ML IJ SOLN
INTRAMUSCULAR | Status: AC
Start: 2023-08-11 — End: ?
  Filled 2023-08-11: qty 2

## 2023-08-11 MED ORDER — CLOPIDOGREL BISULFATE 75 MG PO TABS: 75.0000 mg | ORAL_TABLET | Freq: Every day | ORAL | Status: DC

## 2023-08-11 MED ORDER — POLYETHYLENE GLYCOL 3350 17 G PO PACK
17.0000 g | PACK | Freq: Two times a day (BID) | ORAL | Status: AC
  Filled 2023-08-11 (×3): qty 1

## 2023-08-11 MED ORDER — LABETALOL HCL 5 MG/ML IV SOLN: 10.0000 mg | INTRAVENOUS | Status: DC | PRN

## 2023-08-11 MED ORDER — IODIXANOL 320 MG/ML IV SOLN
INTRAVENOUS | Status: DC | PRN
  Administered 2023-08-11: 50 mL

## 2023-08-11 MED ORDER — HYDRALAZINE HCL 20 MG/ML IJ SOLN: 5.0000 mg | INTRAMUSCULAR | Status: DC | PRN

## 2023-08-11 MED ORDER — CLOPIDOGREL BISULFATE 75 MG PO TABS: 300.0000 mg | ORAL_TABLET | Freq: Once | ORAL | Status: DC

## 2023-08-11 MED ORDER — FENTANYL CITRATE (PF) 100 MCG/2ML IJ SOLN: 25.0000 ug | Freq: Once | INTRAMUSCULAR | Status: AC

## 2023-08-11 MED ORDER — LIDOCAINE HCL (PF) 1 % IJ SOLN
INTRAMUSCULAR | Status: AC
  Filled 2023-08-11: qty 30

## 2023-08-11 MED ORDER — HEPARIN SODIUM (PORCINE) 1000 UNIT/ML IJ SOLN
INTRAMUSCULAR | Status: AC
  Filled 2023-08-11: qty 10

## 2023-08-11 MED ORDER — LIDOCAINE HCL (PF) 1 % IJ SOLN
INTRAMUSCULAR | Status: DC | PRN
  Administered 2023-08-11 (×2): 10 mL

## 2023-08-11 MED ORDER — FENTANYL CITRATE (PF) 100 MCG/2ML IJ SOLN
INTRAMUSCULAR | Status: AC
  Administered 2023-08-11: 25 ug via INTRAVENOUS
  Filled 2023-08-11: qty 2

## 2023-08-11 MED ORDER — MIDAZOLAM HCL 2 MG/2ML IJ SOLN
INTRAMUSCULAR | Status: AC
  Filled 2023-08-11: qty 2

## 2023-08-11 MED ORDER — CLOPIDOGREL BISULFATE 75 MG PO TABS
75.0000 mg | ORAL_TABLET | Freq: Every day | ORAL | Status: DC
  Administered 2023-08-12 – 2023-08-16 (×5): 75 mg via ORAL
  Filled 2023-08-11 (×5): qty 1

## 2023-08-11 MED ORDER — MIDAZOLAM HCL 2 MG/2ML IJ SOLN
INTRAMUSCULAR | Status: DC | PRN
  Administered 2023-08-11 (×2): 1 mg via INTRAVENOUS

## 2023-08-11 MED ORDER — HEPARIN (PORCINE) IN NACL 1000-0.9 UT/500ML-% IV SOLN
INTRAVENOUS | Status: DC | PRN
  Administered 2023-08-11 (×2): 500 mL

## 2023-08-11 MED ORDER — ONDANSETRON HCL 4 MG/2ML IJ SOLN
INTRAMUSCULAR | Status: AC
Start: 2023-08-11 — End: 2023-08-11
  Administered 2023-08-11: 4 mg via INTRAVENOUS
  Filled 2023-08-11: qty 2

## 2023-08-11 MED ORDER — ACETAMINOPHEN 325 MG PO TABS
650.0000 mg | ORAL_TABLET | ORAL | Status: DC | PRN
  Administered 2023-08-12: 650 mg via ORAL
  Filled 2023-08-11: qty 2

## 2023-08-11 SURGICAL SUPPLY — 30 items
BALLN MUSTANG 4.0X40 75 (BALLOONS) ×2 IMPLANT
BALLN MUSTANG 5.0X40 75 (BALLOONS) ×2 IMPLANT
BALLN MUSTANG 6.0X40 75 (BALLOONS) ×2 IMPLANT
BALLN MUSTANG 7.0X40 75 (BALLOONS) ×2 IMPLANT
BALLOON MUSTANG 4.0X40 75 (BALLOONS) IMPLANT
BALLOON MUSTANG 5.0X40 75 (BALLOONS) IMPLANT
BALLOON MUSTANG 6.0X40 75 (BALLOONS) IMPLANT
BALLOON MUSTANG 7.0X40 75 (BALLOONS) IMPLANT
CATH ANGIO 5F BER2 65CM (CATHETERS) IMPLANT
CATH CROSS OVER TEMPO 5F (CATHETERS) IMPLANT
CATH NAVICROSS ST 65CM (CATHETERS) IMPLANT
CATH OMNI FLUSH 5F 65CM (CATHETERS) IMPLANT
CATHETER NAVICROSS ST 65CM (CATHETERS) ×2 IMPLANT
COVER DOME SNAP 22 D (MISCELLANEOUS) IMPLANT
GLIDEWIRE ADV .035X260CM (WIRE) IMPLANT
GUIDEWIRE ANGLED .035X150CM (WIRE) IMPLANT
KIT ANGIASSIST CO2 SYSTEM (KITS) IMPLANT
KIT ENCORE 26 ADVANTAGE (KITS) IMPLANT
KIT MICROPUNCTURE NIT STIFF (SHEATH) IMPLANT
SET ATX-X65L (MISCELLANEOUS) IMPLANT
SHEATH BRITE TIP 7FR 35CM (SHEATH) IMPLANT
SHEATH PINNACLE 5F 10CM (SHEATH) IMPLANT
SHEATH PROBE COVER 6X72 (BAG) IMPLANT
SNARE GOOSENECK 10MM (VASCULAR PRODUCTS) IMPLANT
STENT ELUVIA 7X40X130 (Permanent Stent) IMPLANT
STENT VIABAHN 9X29 7FR 80 (Permanent Stent) IMPLANT
STENT VIABAHN 9X39X80 VBX (Permanent Stent) IMPLANT
TRAY PV CATH (CUSTOM PROCEDURE TRAY) ×2 IMPLANT
WIRE BENTSON .035X145CM (WIRE) IMPLANT
WIRE TORQFLEX AUST .018X40CM (WIRE) IMPLANT

## 2023-08-11 NOTE — Progress Notes (Signed)
 Site area: Left Femoral Artery  Site Prior to Removal:  Level 0 Pressure Applied For: Manual: yes Patient Status During Pull: Stable Post Pull Site:  Level 0 Post Pull Instructions Given:  Yes Post Pull Pulses Present: L DP and PT doppler Dressing Applied:  gauze and tegaderm Bedrest begins @ see R fem artery sheath removal note

## 2023-08-11 NOTE — Plan of Care (Signed)
 NPO past MN for OR tomorrow with podiatry for Right 2nd toe amputation around 1230. Ok to continue anticoag per vascular/primary.         Corinna Gab, DPM [facility] / [facility]                   08/11/2023

## 2023-08-11 NOTE — Progress Notes (Signed)
 SITE AREA: right groin  SITE PRIOR TO REMOVAL:  LEVEL 0  PRESSURE APPLIED FOR: approximately 35 minutes  MANUAL: yes  PATIENT STATUS DURING PULL: stable   POST PULL SITE:  LEVEL 0  POST PULL INSTRUCTIONS GIVEN: yes, drsg x 24 hours, may shower tomorrow, no sitting in water x 1 week, no hot tubs, bath tubs, or pools, take it easy on stairs and climbing into and out of vehicles, if bleeding or anything feels warm and moist to groin areas, please notify staff, if discharged home and bleeding occurs, please call 911  POST PULL PULSES PRESENT: bilateral pedal pulses dopplerable  DRESSING APPLIED: gauze with tegaderm  BEDREST BEGINS @ 1720   COMMENTS:

## 2023-08-11 NOTE — Progress Notes (Addendum)
  Progress Note    08/11/2023 6:47 AM Hospital Day 2  Subjective:  when I told him not to eat he said he was about to call his wife to bring him a coffee and sausage biscuit.  Has concerns about contrast being used.   afebrile  Vitals:   08/10/23 2013 08/11/23 0501  BP: (!) 147/67 (!) 153/66  Pulse: 95 96  Resp: 17 18  Temp: 98 F (36.7 C) 98 F (36.7 C)  SpO2: 96% 96%    Physical Exam: General:  no distress Lungs:  non labored   CBC    Component Value Date/Time   WBC 12.2 (H) 08/09/2023 2003   RBC 4.52 08/09/2023 2003   HGB 13.1 08/09/2023 2003   HCT 39.8 08/09/2023 2003   PLT 279 08/09/2023 2003   MCV 88.1 08/09/2023 2003   MCH 29.0 08/09/2023 2003   MCHC 32.9 08/09/2023 2003   RDW 14.6 08/09/2023 2003   LYMPHSABS 2.2 08/09/2023 1130   MONOABS 1.5 (H) 08/09/2023 1130   EOSABS 0.5 08/09/2023 1130   BASOSABS 0.1 08/09/2023 1130    BMET    Component Value Date/Time   NA 138 08/09/2023 1130   NA 137 10/29/2020 0000   K 4.8 08/09/2023 1130   CL 106 08/09/2023 1130   CO2 23 08/09/2023 1130   GLUCOSE 155 (H) 08/09/2023 1130   BUN 28 (H) 08/09/2023 1130   CREATININE 2.34 (H) 08/09/2023 2003   CALCIUM 9.5 08/09/2023 1130   GFRNONAA 27 (L) 08/09/2023 2003    INR    Component Value Date/Time   INR 1.1 12/26/2020 0900     Intake/Output Summary (Last 24 hours) at 08/11/2023 0647 Last data filed at 08/10/2023 2015 Gross per 24 hour  Intake 873.91 ml  Output 400 ml  Net 473.91 ml     Assessment/Plan:  83 y.o. male with non healing 2nd toe wound right foot  Hospital Day 2  -plan for angiogram today.  Pt creatinine 2.34 yesterday.  Discussed they would use CO2 and minimal contrast.  He says that is what he was told yesterday as well.  He would like to discuss with MD again prior to procedure due to being told by nephrology and cardiology he should not receive contrast.  -continue npo-discussed that if he eats, his procedure would be cancelled.     Doreatha Massed, PA-C [facility] [phone] 08/11/2023 6:47 AM  I have seen and evaluated the patient. I agree with the PA note as documented above.  Plan aortogram lower extremity arteriogram with a focus on the right leg given osteomyelitis with toe wound.  Will try and use CO2 but will require some minimal contrast.  Cephus Shelling, MD [facility]: [phone]

## 2023-08-11 NOTE — Inpatient Diabetes Management (Signed)
 Inpatient Diabetes Program Recommendations  AACE/ADA: New Consensus Statement on Inpatient Glycemic Control (2015)  Target Ranges:  Prepandial:   less than 140 mg/dL      Peak postprandial:   less than 180 mg/dL (1-2 hours)      Critically ill patients:  140 - 180 mg/dL   Lab Results  Component Value Date   GLUCAP 264 (H) 08/11/2023   HGBA1C 7.4 (A) 08/04/2023    Review of Glycemic Control  Latest Reference Range & Units 08/10/23 06:11 08/10/23 12:26 08/10/23 16:47 08/10/23 20:15 08/11/23 06:53 08/11/23 11:18 08/11/23 11:47 08/11/23 12:39  Glucose-Capillary 70 - 99 mg/dL 191 (H) 478 (H) 295 (H) 198 (H) 71 55 (L) 58 (L) 264 (H)   Diabetes history: DM 2 Outpatient Diabetes medications: Novolog 10 units tid, Tresiba 56 units bid  Current orders for Inpatient glycemic control:  Semglee 40 units bid Novolog 0-9 units tid + hs Novolog 2 units tid meal coverage  Note Hypoglycemia in the 50's this am  Inpatient Diabetes Program Recommendations:    -   Reduce Semglee to 30-35 units bid  Thanks,  Christena Deem RN, MSN, BC-ADM Inpatient Diabetes Coordinator Team Pager [phone] (8a-5p)

## 2023-08-11 NOTE — Op Note (Addendum)
 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  Ultrasound-guided access left common femoral artery 2.  CO2 aortogram with catheter selection of aorta 3.  Ultrasound-guided access right common femoral artery for retrograde recanalization of occluded right common iliac artery 4.  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  Angioplasty and stent of the right external iliac artery high-grade stenosis (7 mm x 40 mm Eluvia postdilated with 6 mm and 7 mm Mustang) 6.  Right lower extremity angiogram with CO2 using runoff from right femoral sheath 7.  92 minutes of monitored moderate conscious sedation time  Indications: Patient is an 83 year old male with tissue loss in the right lower extremity with osteomyelitis of his right second toe.  He is scheduled for lower extremity angiogram with possible intervention with a focus on the right leg.  He presents after risk benefits discussed.  Contrast: 50 mL, rest of case with CO2  Findings:   Ultrasound-guided access left common femoral artery.  CO2 aortogram showed patent infrarenal aorta with right common iliac artery occlusion.  The hypogastric on the right was patent.  The right external iliac had a high-grade calcified 90% stenosis.  Distally the right common femoral was patent with some posterior plaque with a patent SFA and profunda bifurcation proximally.  I tried to come antegrade from the aorta with left femoral access to get through the right common iliac occlusion unsuccessfully.  I then accessed the right common femoral artery for retrograde access.  Ultimately I was in a dissection plane and could not get through the true lumen retrograde.   Finally I was able to get a wire down from left femoral access that was using a crossover catheter with a wire in the aorta and got a wire down the right iliac occlusion and the wire was snared from the right femoral sheath for through and through access.  I then placed a 7 [place] Brite tip sheath would not advance through the common iliac occlusion.  The sheath on the right would not advance through the right common iliac artery occlusion.  I had to predilate the right common iliac artery occlsuion with a 4 mm and 5 mm Mustang.  Finally got a Brite tip sheath into the aorta from right femoral access.  I then treated this right common iliac occlusion from the aortic bifurcation down to the hypogastric with a 9 mm x 39 mm VBX and 9 mm x 29 mm VBX.  I then treated the right external iliac high-grade stenosis just distal to the hypogastric with a 7 mm x 40 mm Eluvia initially postdilated to 6 mm and then I upsized to a 7 mm angioplasty balloon to get more luminal gain.  At completion stents were widely patent with filling of the hypogastric.  I did get right lower extremity runoff with CO2 showing distal SFA/ above knee popliteal occlusion and patent below knee popliteal occlusion with two-vessel runoff and anterior tibial and posterior tibial.   Procedure:  The patient was identified in the holding area and taken to room 8.  The patient was then placed supine on the table and prepped and draped in the usual sterile fashion.  A time out was  called.  The patient received Versed and fentanyl for conscious moderate sedation.  Vital signs were monitored including heart rate, respiratory rate, oxygenation and blood pressure.  I was present for all moderate sedation.  Ultrasound was used to evaluate the left common femoral artery.  It was patent .  A digital ultrasound image was acquired.  A micropuncture needle was used to access the left common femoral artery under ultrasound guidance.  An 018 wire was advanced without  resistance and a micropuncture sheath was placed.  The 018 wire was removed and a benson wire was placed.  The micropuncture sheath was exchanged for a 5 french sheath.  An omniflush catheter was advanced over the wire to the level of L-1.  An abdominal angiogram was obtained with CO2.  This demonstrated right common iliac occlusion as noted above.  I tried to use an Omni Flush catheter with a Glidewire to get down the right iliac occlusion antegrade from left femoral access with a wire and catheter in the aorta.  This initially was unsuccessful.  I then accessed the right common femoral artery with ultrasound using a micro access needle placed a microwire and a microsheath and then a Bentson wire and a short 5 [place] sheath.  I then used a guidewire advantage with a BER 2 catheter to come retrograde and ultimately upsized to a 7 [place] Brite tip sheath.  The patient was given 100 units/kg IV heparin.  ACT was checked during the case.   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  Also tried a glide cath unsuccessfully.  I then came back from our left femoral sheath with a crossover catheter and a Glidewire and finally I was able to get a wire down through the right common iliac occlusion antegrade into the right external iliac artery.  I then snared this wire from right femoral sheath and had through and through access with a 4 French snare.  I tried advancing the 7 French sheath over the wire from the right groin into the aorta but this would not advance.  I then advanced a bare 2 catheter exchanged for a Glidewire advantage for more support to the right femoral sheath.  I had to treat the right common iliac occlusion with a 4 mm and 5 mm Mustang's before I could get the sheath to advance as this would not advance without predilation given the chronic total  occlusion.  Finally I selected a 9 mm x 39 mm VBX deployed in the proximal right common iliac at the aortic bifurcation and had to extend with a second 9 mm x 29 mm VBX with preservation of the hypogastric.  I then got a planning image and went ahead and extended with a 7 mm x 40 mm drug-coated Eluvia in the right external iliac artery just beyond the hypogastric.  This was postdilated with a 6 mm Mustang but I had over 30% residual stenosis so I upsized to a 7 mm Mustang.  Much more satisfied with the results.  After stenting the right iliac, flow down the right iliac was now as quick as the left side with no significant residual stenosis in the iliac.  I then elected to shoot the runoff in the right leg with CO2 given we already used 50 cc of contrast with his chronic kidney disease.  Pertinent findings  are noted above.  He will need staged intervention for his right SFA occlusion.  Wires and catheters were removed.  Will be taken holding for sheath removal.  Plan: Excellent results after stenting of right iliac occlusion today.  Aspirin, plavix, statin.  Will need staged intervention on the right SFA/above knee popliteal occlusion.  Unable to do this today due to contrast volume as well as right femoral sheath in place to treat right iliac occlusion.    Cephus Shelling, MD [facility] [place] Office: [phone]

## 2023-08-11 NOTE — Progress Notes (Signed)
 [facility] PROGRESS NOTE    Progress Note  Eric Choi  WGN:[id number] DOB: [birth year] DOA: 08/09/2023 PCP: Shirline Frees, NP     Brief Narrative:   Eric Choi is an 83 y.o. male past medical history of essential hypertension, hyperlipidemia CAD uncontrolled diabetes mellitus type 2 insulin-dependent sent from the podiatrist office due to right second toe infection and osteomyelitis, care of the foot confirmed lytic lesion of the distal tuft of the second phalange.    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 X-ray done here still shows osteomyelitis. Started empirically IV doxy and Unasyn. ABIs showed moderate atherosclerotic disease. MRI of the foot showed marrow edema of the distal phalanges with acute osteomyelitis Vascular surgery was consulted angiogram on 08/11/2023 started on gentle hydration. Podiatry recommended second toe amputation following vascular intervention.  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    Chronic kidney disease stage IIIb: Baseline creatinine around 2, continue bicarbonate tabs. Creatinine stable.  CAD/PAD/hyperlipidemia: Continue aspirin and fenofibrate.  Essential hypertension: Hold ACE inhibitor start Norvasc at a lower dose. Continue monitoring blood pressure.   DVT prophylaxis: Lovenox Family Communication:none Status is: Inpatient Remains inpatient appropriate because: Right foot osteomyelitis    Code Status:     Code Status Orders  (From admission, onward)           Start     Ordered   08/09/23 1822  Full code  Continuous       Question:  By:  Answer:  Consent: discussion documented in EHR   08/09/23 1823           Code  Status History     Date Active Date Inactive Code Status Order ID Comments User Context   12/31/2020 1742 01/01/2021 1848 Full Code [id number]  Milinda Antis, PA-C Inpatient         IV Access:   Peripheral IV   Procedures and diagnostic studies:   MR FOOT RIGHT WO CONTRAST Result Date: 08/10/2023 CLINICAL DATA:  Infection.  Evaluate for osteomyelitis. EXAM: MRI OF THE RIGHT FOREFOOT WITHOUT CONTRAST TECHNIQUE: Multiplanar, multisequence MR imaging of the right forefoot was performed. No intravenous contrast was administered. COMPARISON:  Right foot radiographs 08/09/2023 (multiple studies) FINDINGS: Bones/Joint/Cartilage There is marrow edema seen throughout the distal phalanx of the second toe (sagittal series image 18 and coronal series 5, image 15). Mild erosion of the distal aspect of the distal phalanx of the second toe as seen on yesterday's radiographs (coronal series 6, image 15). Moderate dorsal second tarsometatarsal joint space narrowing, cartilage thinning, subchondral marrow edema, and lateral aspect of the intermediate cuneiform subchondral cystic change. Similar moderate third and mild-to-moderate first and fourth tarsometatarsal osteoarthritis. The fifth tarsometatarsal joint is not imaged. Mild great toe metatarsophalangeal joint effusion. Ligaments The Lisfranc ligament complex is intact. The metatarsophalangeal and interphalangeal collateral ligaments appear intact. The great toe sesamoid phalangeal ligaments are intact. Muscles and Tendons There is mild edema within the intrinsic interosseous midfoot and forefoot musculature. No tendon tear is seen. Soft tissues There is again ulceration seen at the distal aspect of second toe (sagittal series 7, image 18). Moderate distal and mild proximal second toe soft tissue swelling and edema. Mild dorsal midfoot subcutaneous fat edema and swelling. No walled-off abscess is seen. IMPRESSION: 1.  Ulceration at the distal aspect of the second  toe. Marrow edema throughout the distal phalanx of the second toe with mild distal cortical erosion consistent with acute osteomyelitis. 2. Moderate second and third tarsometatarsal osteoarthritis. Electronically Signed   By: Neita Garnet M.D.   On: 08/10/2023 09:56   VAS [place] ABI WITH/WO TBI Result Date: 08/09/2023  LOWER EXTREMITY DOPPLER STUDY Patient Name:  Eric Choi  Date of Exam:   08/09/2023 Medical Rec #: [id number]       Accession #:    [id number] Date of Birth: 1940-09-20       Patient Gender: M Patient Age:   28 years Exam Location:  [facility] Procedure:      VAS [place] ABI WITH/WO TBI Referring Phys: BRITNI HENDERLY --------------------------------------------------------------------------------  Indications: Ulceration. High Risk Factors: Hypertension, hyperlipidemia, Diabetes, past history of                    smoking, prior MI, coronary artery disease.  Vascular Interventions: Hx of RT CEA (12/31/2020). Comparison Study: Previous exam 04/08/23 Performing Technologist: Ernestene Mention RVT/RDMS  Examination Guidelines: A complete evaluation includes at minimum, Doppler waveform signals and systolic blood pressure reading at the level of bilateral brachial, anterior tibial, and posterior tibial arteries, when vessel segments are accessible. Bilateral testing is considered an integral part of a complete examination. Photoelectric Plethysmograph (PPG) waveforms and toe systolic pressure readings are included as required and additional duplex testing as needed. Limited examinations for reoccurring indications may be performed as noted.  ABI Findings: +---------+------------------+-----+----------+--------+ Right    Rt Pressure (mmHg)IndexWaveform  Comment  +---------+------------------+-----+----------+--------+ Brachial 159                    triphasic          +---------+------------------+-----+----------+--------+ PTA      95                0.56 monophasic          +---------+------------------+-----+----------+--------+ DP       80                0.47 monophasic         +---------+------------------+-----+----------+--------+ Great Toe27                0.16 Abnormal           +---------+------------------+-----+----------+--------+ +---------+------------------+-----+----------+-------+ Left     Lt Pressure (mmHg)IndexWaveform  Comment +---------+------------------+-----+----------+-------+ Brachial 169                    triphasic         +---------+------------------+-----+----------+-------+ PTA      140               0.83 biphasic          +---------+------------------+-----+----------+-------+ DP       95                0.56 monophasic        +---------+------------------+-----+----------+-------+ Great Toe60                0.36 Abnormal          +---------+------------------+-----+----------+-------+ +-------+-----------+-----------+------------+------------+ ABI/TBIToday's ABIToday's TBIPrevious ABIPrevious TBI +-------+-----------+-----------+------------+------------+ Right  0.56       0.16       0.68        0.41         +-------+-----------+-----------+------------+------------+ Left   0.83       0.36  0.85        0.58         +-------+-----------+-----------+------------+------------+  Right ABIs appear decreased. Bilateral TBIs appear decreased.  Summary: Right: Resting right ankle-brachial index indicates moderate right lower extremity arterial disease. The right toe-brachial index is abnormal. Left: Resting left ankle-brachial index indicates mild left lower extremity arterial disease. The left toe-brachial index is abnormal. *See table(s) above for measurements and observations.  Electronically signed by Carolynn Sayers on 08/09/2023 at 3:01:55 PM.    Final    DG Foot Complete Right Result Date: 08/09/2023 CLINICAL DATA:  Second toe ulcer. EXAM: RIGHT FOOT COMPLETE - 3+ VIEW COMPARISON:   Radiographs of same day. FINDINGS: There is lytic destruction involving distal tuft of second distal phalanx consistent with osteomyelitis, with overlying soft tissue ulceration. Mild posterior calcaneal spurring is noted. Vascular calcifications are noted. No fracture or dislocation is noted. IMPRESSION: Lytic destruction involving distal tuft of second distal phalanx consistent with osteomyelitis with overlying soft tissue ulceration. Electronically Signed   By: Lupita Raider M.D.   On: 08/09/2023 15:01   DG Foot Complete Right Result Date: 08/09/2023 Please see detailed radiograph report in office note.    Medical Consultants:   None.   Subjective:    [name] relates pain is controlled.  Has not had a bowel movement.  Objective:    Vitals:   08/10/23 1557 08/10/23 2013 08/11/23 0501 08/11/23 0727  BP: (!) 157/70 (!) 147/67 (!) 153/66 (!) 162/75  Pulse: 92 95 96 88  Resp: 20 17 18 17   Temp:  98 F (36.7 C) 98 F (36.7 C) 97.9 F (36.6 C)  TempSrc:  Oral Oral Oral  SpO2: 97% 96% 96% 98%  Weight:      Height:       SpO2: 98 %   Intake/Output Summary (Last 24 hours) at 08/11/2023 0806 Last data filed at 08/10/2023 2015 Gross per 24 hour  Intake 873.91 ml  Output 400 ml  Net 473.91 ml   Filed Weights   08/09/23 1128 08/09/23 1928  Weight: 106.8 kg 107.7 kg    Exam: General exam: In no acute distress. Respiratory system: Good air movement and clear to auscultation. Cardiovascular system: S1 & S2 heard, RRR. No JVD. Gastrointestinal system: Abdomen is nondistended, soft and nontender.  Extremities: No pedal edema. Skin: No rashes, lesions or ulcers Psychiatry: Judgement and insight appear normal. Mood & affect appropriate.  Data Reviewed:    Labs: Basic Metabolic Panel: Recent Labs  Lab 08/09/23 1130 08/09/23 2003 08/11/23 0736  NA 138  --  139  K 4.8  --  4.4  CL 106  --  108  CO2 23  --  21*  GLUCOSE 155*  --  79  BUN 28*  --  27*   CREATININE 2.34* 2.34* 2.13*  CALCIUM 9.5  --  9.4   GFR Estimated Creatinine Clearance: 32.4 mL/min (A) (by C-G formula based on SCr of 2.13 mg/dL (H)). Liver Function Tests: Recent Labs  Lab 08/09/23 1130  AST 17  ALT 16  ALKPHOS 51  BILITOT 0.5  PROT 7.0  ALBUMIN 3.3*   No results for input(s): "LIPASE", "AMYLASE" in the last 168 hours. No results for input(s): "AMMONIA" in the last 168 hours. Coagulation profile No results for input(s): "INR", "PROTIME" in the last 168 hours. COVID-19 Labs  No results for input(s): "DDIMER", "FERRITIN", "LDH", "CRP" in the last 72 hours.  Lab Results  Component Value Date  SARSCOV2NAA RESULT: NEGATIVE 12/29/2020    CBC: Recent Labs  Lab 08/09/23 1130 08/09/23 2003  WBC 13.8* 12.2*  NEUTROABS 9.5*  --   HGB 13.4 13.1  HCT 42.1 39.8  MCV 91.1 88.1  PLT 309 279   Cardiac Enzymes: No results for input(s): "CKTOTAL", "CKMB", "CKMBINDEX", "TROPONINI" in the last 168 hours. BNP (last 3 results) No results for input(s): "PROBNP" in the last 8760 hours. CBG: Recent Labs  Lab 08/10/23 0611 08/10/23 1226 08/10/23 1647 08/10/23 2015 08/11/23 0653  GLUCAP 125* 184* 177* 198* 71   D-Dimer: No results for input(s): "DDIMER" in the last 72 hours. Hgb A1c: No results for input(s): "HGBA1C" in the last 72 hours. Lipid Profile: No results for input(s): "CHOL", "HDL", "LDLCALC", "TRIG", "CHOLHDL", "LDLDIRECT" in the last 72 hours. Thyroid function studies: No results for input(s): "TSH", "T4TOTAL", "T3FREE", "THYROIDAB" in the last 72 hours.  Invalid input(s): "FREET3" Anemia work up: No results for input(s): "VITAMINB12", "FOLATE", "FERRITIN", "TIBC", "IRON", "RETICCTPCT" in the last 72 hours. Sepsis Labs: Recent Labs  Lab 08/09/23 1130 08/09/23 1145 08/09/23 2003  WBC 13.8*  --  12.2*  LATICACIDVEN  --  0.8  --    Microbiology Recent Results (from the past 240 hours)  MRSA Next Gen by PCR, Nasal     Status: None    Collection Time: 08/09/23  7:44 PM   Specimen: Nasal Mucosa; Nasal Swab  Result Value Ref Range Status   MRSA by PCR Next Gen NOT DETECTED NOT DETECTED Final    Comment: (NOTE) The GeneXpert MRSA Assay (FDA approved for NASAL specimens only), is one component of a comprehensive MRSA colonization surveillance program. It is not intended to diagnose MRSA infection nor to guide or monitor treatment for MRSA infections. Test performance is not FDA approved in patients less than 32 years old. Performed at [facility], [address] [place], [place] [address]      Medications:    amLODipine  5 mg Oral Daily   aspirin EC  81 mg Oral Daily   atorvastatin  40 mg Oral Daily   cholecalciferol  1,000 Units Oral Daily   enoxaparin (LOVENOX) injection  30 mg Subcutaneous Q24H   ezetimibe  10 mg Oral Daily   gemfibrozil  300 mg Oral Q1400   insulin aspart  0-5 Units Subcutaneous QHS   insulin aspart  0-9 Units Subcutaneous TID WC   insulin aspart  2 Units Subcutaneous TID WC   insulin glargine-yfgn  40 Units Subcutaneous BID   metoprolol tartrate  50 mg Oral Daily   sodium bicarbonate  650 mg Oral BID   Continuous Infusions:  sodium chloride 75 mL/hr at 08/10/23 1056   ampicillin-sulbactam (UNASYN) IV 1.5 g (08/11/23 0415)      LOS: 2 days   Marinda Elk  [facility]  08/11/2023, 8:06 AM

## 2023-08-12 ENCOUNTER — Encounter (HOSPITAL_COMMUNITY): Payer: Self-pay | Admitting: Vascular Surgery

## 2023-08-12 ENCOUNTER — Encounter (HOSPITAL_COMMUNITY)
Admission: EM | Disposition: A | Discharge status: Home / Self Care | Payer: Self-pay | Source: Ambulatory Visit | Attending: Internal Medicine

## 2023-08-12 DIAGNOSIS — I739 Peripheral vascular disease, unspecified: Secondary | ICD-10-CM | POA: Diagnosis not present

## 2023-08-12 DIAGNOSIS — M86171 Other acute osteomyelitis, right ankle and foot: Secondary | ICD-10-CM | POA: Diagnosis not present

## 2023-08-12 DIAGNOSIS — I70235 Atherosclerosis of native arteries of right leg with ulceration of other part of foot: Secondary | ICD-10-CM | POA: Diagnosis not present

## 2023-08-12 DIAGNOSIS — N189 Chronic kidney disease, unspecified: Secondary | ICD-10-CM | POA: Diagnosis not present

## 2023-08-12 DIAGNOSIS — M869 Osteomyelitis, unspecified: Secondary | ICD-10-CM | POA: Diagnosis not present

## 2023-08-12 HISTORY — PX: LOWER EXTREMITY INTERVENTION: CATH118252

## 2023-08-12 HISTORY — PX: PERIPHERAL INTRAVASCULAR LITHOTRIPSY: CATH118324

## 2023-08-12 HISTORY — PX: LOWER EXTREMITY ANGIOGRAPHY: CATH118251

## 2023-08-12 LAB — BASIC METABOLIC PANEL WITH GFR
Anion gap: 12 (ref 5–15)
BUN: 24 mg/dL — ABNORMAL HIGH (ref 8–23)
CO2: 21 mmol/L — ABNORMAL LOW (ref 22–32)
Calcium: 9.1 mg/dL (ref 8.9–10.3)
Chloride: 108 mmol/L (ref 98–111)
Creatinine, Ser: 2.1 mg/dL — ABNORMAL HIGH (ref 0.61–1.24)
GFR, Estimated: 31 mL/min — ABNORMAL LOW (ref >60)
Glucose, Bld: 95 mg/dL (ref 70–99)
Potassium: 4 mmol/L (ref 3.5–5.1)
Sodium: 141 mmol/L (ref 135–145)

## 2023-08-12 LAB — LIPID PANEL
Cholesterol: 75 mg/dL (ref 0–200)
HDL: 29 mg/dL — ABNORMAL LOW (ref >40)
LDL Cholesterol: 24 mg/dL (ref 0–99)
Total CHOL/HDL Ratio: 2.6 ratio
Triglycerides: 110 mg/dL (ref <150)
VLDL: 22 mg/dL (ref 0–40)

## 2023-08-12 LAB — GLUCOSE, CAPILLARY
Glucose-Capillary: 103 mg/dL — ABNORMAL HIGH (ref 70–99)
Glucose-Capillary: 108 mg/dL — ABNORMAL HIGH (ref 70–99)
Glucose-Capillary: 112 mg/dL — ABNORMAL HIGH (ref 70–99)
Glucose-Capillary: 112 mg/dL — ABNORMAL HIGH (ref 70–99)
Glucose-Capillary: 119 mg/dL — ABNORMAL HIGH (ref 70–99)
Glucose-Capillary: 162 mg/dL — ABNORMAL HIGH (ref 70–99)
Glucose-Capillary: 176 mg/dL — ABNORMAL HIGH (ref 70–99)
Glucose-Capillary: 52 mg/dL — ABNORMAL LOW (ref 70–99)
Glucose-Capillary: 57 mg/dL — ABNORMAL LOW (ref 70–99)
Glucose-Capillary: 76 mg/dL (ref 70–99)

## 2023-08-12 LAB — POCT ACTIVATED CLOTTING TIME
Activated Clotting Time: 198 s
Activated Clotting Time: 228 s

## 2023-08-12 SURGERY — LOWER EXTREMITY ANGIOGRAPHY
Anesthesia: LOCAL | Laterality: Right

## 2023-08-12 SURGERY — AMPUTATION, TOE
Anesthesia: Choice | Site: Toe | Laterality: Right

## 2023-08-12 MED ORDER — HEPARIN SODIUM (PORCINE) 1000 UNIT/ML IJ SOLN
INTRAMUSCULAR | Status: AC
  Filled 2023-08-12: qty 10

## 2023-08-12 MED ORDER — DEXTROSE-SODIUM CHLORIDE 5-0.45 % IV SOLN: INTRAVENOUS | Status: DC

## 2023-08-12 MED ORDER — SODIUM CHLORIDE 0.9 % IV SOLN: 250.0000 mL | INTRAVENOUS | Status: AC | PRN

## 2023-08-12 MED ORDER — DEXTROSE 50 % IV SOLN
INTRAVENOUS | Status: DC | PRN
  Administered 2023-08-12: .5 via INTRAVENOUS

## 2023-08-12 MED ORDER — SODIUM CHLORIDE 0.9 % WEIGHT BASED INFUSION: 1.0000 mL/kg/h | INTRAVENOUS | Status: DC

## 2023-08-12 MED ORDER — IODIXANOL 320 MG/ML IV SOLN
INTRAVENOUS | Status: DC | PRN
  Administered 2023-08-12: 55 mL

## 2023-08-12 MED ORDER — MIDAZOLAM HCL 2 MG/2ML IJ SOLN
INTRAMUSCULAR | Status: DC | PRN
  Administered 2023-08-12 (×2): 1 mg via INTRAVENOUS

## 2023-08-12 MED ORDER — HEPARIN SODIUM (PORCINE) 1000 UNIT/ML IJ SOLN
INTRAMUSCULAR | Status: DC | PRN
  Administered 2023-08-12: 2000 [IU] via INTRAVENOUS
  Administered 2023-08-12: 10000 [IU] via INTRAVENOUS

## 2023-08-12 MED ORDER — PROTAMINE SULFATE 10 MG/ML IV SOLN
INTRAVENOUS | Status: DC | PRN
  Administered 2023-08-12: 10 mg via INTRAVENOUS
  Administered 2023-08-12: 40 mg via INTRAVENOUS

## 2023-08-12 MED ORDER — SODIUM CHLORIDE 0.9% FLUSH: 3.0000 mL | INTRAVENOUS | Status: DC | PRN

## 2023-08-12 MED ORDER — LIDOCAINE HCL (PF) 1 % IJ SOLN
INTRAMUSCULAR | Status: AC
  Filled 2023-08-12: qty 30

## 2023-08-12 MED ORDER — SODIUM CHLORIDE 0.9% FLUSH
3.0000 mL | Freq: Two times a day (BID) | INTRAVENOUS | Status: DC
  Administered 2023-08-13 – 2023-08-16 (×5): 3 mL via INTRAVENOUS

## 2023-08-12 MED ORDER — MIDAZOLAM HCL 2 MG/2ML IJ SOLN
INTRAMUSCULAR | Status: AC
  Filled 2023-08-12: qty 2

## 2023-08-12 MED ORDER — FENTANYL CITRATE (PF) 100 MCG/2ML IJ SOLN
INTRAMUSCULAR | Status: AC
Start: 2023-08-12 — End: ?
  Filled 2023-08-12: qty 2

## 2023-08-12 MED ORDER — HEPARIN (PORCINE) IN NACL 1000-0.9 UT/500ML-% IV SOLN
INTRAVENOUS | Status: DC | PRN
  Administered 2023-08-12 (×2): 500 mL

## 2023-08-12 MED ORDER — DEXTROSE 50 % IV SOLN
INTRAVENOUS | Status: AC
  Filled 2023-08-12: qty 50

## 2023-08-12 MED ORDER — ENOXAPARIN SODIUM 40 MG/0.4ML IJ SOSY
40.0000 mg | PREFILLED_SYRINGE | INTRAMUSCULAR | Status: DC
  Administered 2023-08-12 – 2023-08-15 (×4): 40 mg via SUBCUTANEOUS
  Filled 2023-08-12 (×4): qty 0.4

## 2023-08-12 MED ORDER — SODIUM CHLORIDE 0.9 % IV SOLN: INTRAVENOUS | Status: DC

## 2023-08-12 MED ORDER — LIDOCAINE HCL (PF) 1 % IJ SOLN
INTRAMUSCULAR | Status: DC | PRN
  Administered 2023-08-12: 10 mL

## 2023-08-12 MED ORDER — ENOXAPARIN SODIUM 30 MG/0.3ML IJ SOSY: 30.0000 mg | PREFILLED_SYRINGE | INTRAMUSCULAR | Status: DC

## 2023-08-12 MED ORDER — FENTANYL CITRATE (PF) 100 MCG/2ML IJ SOLN
INTRAMUSCULAR | Status: DC | PRN
  Administered 2023-08-12 (×2): 50 ug via INTRAVENOUS

## 2023-08-12 MED ORDER — SODIUM CHLORIDE 0.9 % WEIGHT BASED INFUSION
1.0000 mL/kg/h | INTRAVENOUS | Status: AC
  Administered 2023-08-12 – 2023-08-13 (×2): 1 mL/kg/h via INTRAVENOUS

## 2023-08-12 MED ORDER — PROTAMINE SULFATE 10 MG/ML IV SOLN
INTRAVENOUS | Status: AC
  Filled 2023-08-12: qty 5

## 2023-08-12 SURGICAL SUPPLY — 25 items
BALLN MUSTANG 5X150X135 (BALLOONS) ×1 IMPLANT
BALLN MUSTANG 6X150X135 (BALLOONS) ×1 IMPLANT
BALLN STERLING OTW 3X150X150 (BALLOONS) ×1 IMPLANT
BALLOON MUSTANG 5X150X135 (BALLOONS) IMPLANT
BALLOON MUSTANG 6X150X135 (BALLOONS) IMPLANT
BALLOON STERLING OTW 3X150X150 (BALLOONS) IMPLANT
CATH OMNI FLUSH 5F 65CM (CATHETERS) IMPLANT
CATH QUICKCROSS .035X135CM (MICROCATHETER) IMPLANT
CATH QUICKCROSS SUPP .035X90CM (MICROCATHETER) IMPLANT
CATH SHOCKWAVE E8 6X80 (CATHETERS) IMPLANT
GLIDEWIRE ADV .035X260CM (WIRE) IMPLANT
KIT ANGIASSIST CO2 SYSTEM (KITS) IMPLANT
KIT ENCORE 26 ADVANTAGE (KITS) IMPLANT
KIT MICROPUNCTURE NIT STIFF (SHEATH) IMPLANT
SET ATX-X65L (MISCELLANEOUS) IMPLANT
SHEATH CATAPULT 6F 45 MP (SHEATH) IMPLANT
SHEATH PINNACLE 5F 10CM (SHEATH) IMPLANT
SHEATH PROBE COVER 6X72 (BAG) IMPLANT
STENT ELUVIA 7X150X130 (Permanent Stent) IMPLANT
STOPCOCK MORSE 400PSI 3WAY (MISCELLANEOUS) IMPLANT
TRAY PV CATH (CUSTOM PROCEDURE TRAY) ×1 IMPLANT
WIRE AMPLATZ SS-J .035X180CM (WIRE) IMPLANT
WIRE BENTSON .035X145CM (WIRE) IMPLANT
WIRE G V18X300CM (WIRE) IMPLANT
WIRE SPARTACORE .014X300CM (WIRE) IMPLANT

## 2023-08-12 NOTE — Care Management Important Message (Signed)
 Important Message  Patient Details  Name: Eric Choi MRN: [id number] Date of Birth: 06/30/1940   Important Message Given:  Yes - Medicare IM     Dorena Bodo 08/12/2023, 2:52 PM

## 2023-08-12 NOTE — Progress Notes (Signed)
 Late entry-Family asked if I could verify if pt was having his amputation on 3/28. I secure chatted with Dr. Annamary Rummage and he stated it was canceled and be rescheduled at a later date. Family updated.

## 2023-08-12 NOTE — Op Note (Addendum)
 DATE OF SERVICE: 08/12/2023  PATIENT:  Eric Choi  83 y.o. male  PRE-OPERATIVE DIAGNOSIS:  Atherosclerosis of native arteries of right lower extremity causing ulceration  POST-OPERATIVE DIAGNOSIS:  Same  PROCEDURE:   1) Ultrasound guided left common femoral artery access 2) Right lower extremity angiogram with third order cannulation  3) Right femoropopliteal intravascular lithotripsy (5x60mm Shockwave) 3) Right femoropopliteal angioplasty and stenting (7x154mm Eluvia) 4) Conscious sedation (83 minutes)  SURGEON:  Rande Brunt. Lenell Antu, MD  ASSISTANT: none  ANESTHESIA:   local and IV sedation  ESTIMATED BLOOD LOSS: minimal  LOCAL MEDICATIONS USED:  LIDOCAINE   COUNTS: confirmed correct.  PATIENT DISPOSITION:  PACU - hemodynamically stable.   Delay start of Pharmacological VTE agent (>24hrs) due to surgical blood loss or risk of bleeding: no  INDICATION FOR PROCEDURE: [name] is a 83 y.o. 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 After careful discussion of risks, benefits, and alternatives the patient was offered angiography. The patient understood and wished to proceed.  OPERATIVE FINDINGS:   Left renal artery: not studied Right renal artery: not studied  Infrarenal aorta: not studied  Left common iliac artery: not studied Right common iliac artery: not studied  Left internal iliac artery: not studied Right internal iliac artery: not studied  Left external iliac artery: not studied Right external iliac artery: not studied  Left common femoral artery: not studied Right common femoral artery: patent  Left profunda femoris artery: not studied Right profunda femoris artery: patent  Left superficial femoral artery: not studied Right superficial femoral artery: patent proximally; occluded distally at hunter's canal. Heavily calcified plaque distally  Left popliteal artery: not studied Right  popliteal artery: reconstitutes above the knee; patent behind and below the knee  Left anterior tibial artery: not studied Right anterior tibial artery: patent to the foot  Left tibioperoneal trunk: not studied Right tibioperoneal trunk: patent  Left peroneal artery: not studied Right peroneal artery: patent to the ankle  Left posterior tibial artery: not studied Right posterior tibial artery: patent proximally, occludes distally  Left pedal circulation: not studied Right pedal circulation: fills via AT   DESCRIPTION OF PROCEDURE: After identification of the patient in the pre-operative holding area, the patient was transferred to the operating room. The patient was positioned supine on the operating room table. Anesthesia was induced. The groins was prepped and draped in standard fashion. A surgical pause was performed confirming correct patient, procedure, and operative location.  The left groin was anesthetized with subcutaneous injection of 1% lidocaine. Using ultrasound guidance, the left common femoral artery was accessed with micropuncture technique. Fluoroscopy was used to confirm cannulation over the femoral head. The 40F micropuncture sheath was upsized to 47F.   The right common iliac artery was selected with an omniflush catheter and glidewire advantage guidewire. The wire was advanced into the common femoral artery. Over the wire the omni flush catheter was advanced into the external iliac artery. Selective angiography was performed - see above for details.   The decision was made to intervene. The patient was heparinized with 10,000 units of heparin. The 47F sheath was exchanged for a 62F x 45cm sheath. Selective angiography of the left lower extremity was performed prior to intervention.   The case was highly challenging.  His iliac anatomy was very tortuous.  His obese groins made delivering a sheath into the external iliac artery challenging.  Ultimately, I was able to cross the occluded  femoral-popliteal segment with a Glidewire and quick cross catheter.  During my initial intervention, I elected to use a 7 x 150 mm Eluvia to primarily stent the lesion as it was heavily calcified.  The stent fractured during deployment and had to be carefully retrieved.  About 20 mm of the stent remained and deployed, I was able to retrieve the rest.  I then performed balloon assisted intravascular lithotripsy of the lesion.  After this and angioplasty with a 5 x 150 mm Mustang balloon, I was able to cross the lesion without difficulty with an additional 7 x 150 mm Eluvia.  This was postdilated with a 6 x 150 mm Mustang balloon. The previous, fractured stent fragment was completely covered and excluded from the circulation. Completion angiogram showed recanalization of the femoral-popliteal occlusion.  During the course of the case a small hematoma accumulated in the groin.  Elected to remove all endovascular equipment and hold pressure in the room to obtain hemostasis.  Heparin was reversed with protamine.  Conscious sedation was administered with the use of IV fentanyl and midazolam under continuous physician and nurse monitoring.  Heart rate, blood pressure, and oxygen saturation were continuously monitored.  Total sedation time was 83 minutes  Upon completion of the case instrument and sharps counts were confirmed correct. The patient was transferred to the PACU in good condition. I was present for all portions of the procedure.  PLAN: Aspirin, Plavix, statin therapies.  Follow-up with me or Dr. Chestine Spore in 4 weeks with ankle-brachial index and arterial duplex.  Fractured stent is covered by normal, intact stent; no specific attention needed to this in follow-up.  Rande Brunt. Lenell Antu, MD [facility] Phone Number: [phone] 08/12/2023 5:01 PM

## 2023-08-12 NOTE — Progress Notes (Addendum)
  Progress Note    08/12/2023 6:35 AM 1 Day Post-Op  Subjective:  no complaints; wife is happy our practice has been involved in his care.   afebrile  Vitals:   08/12/23 0546 08/12/23 0600  BP: (!) 147/77   Pulse: 97 100  Resp: 20 19  Temp: 97.6 F (36.4 C)   SpO2: 98% 98%    Physical Exam: General:  no distress Cardiac:  regular Lungs:  non labored Incisions:  right groin is soft without hematoma Extremities:  brisk right DP Abdomen:  soft  CBC    Component Value Date/Time   WBC 12.2 (H) 08/09/2023 2003   RBC 4.52 08/09/2023 2003   HGB 13.9 08/11/2023 1455   HCT 41.0 08/11/2023 1455   PLT 279 08/09/2023 2003   MCV 88.1 08/09/2023 2003   MCH 29.0 08/09/2023 2003   MCHC 32.9 08/09/2023 2003   RDW 14.6 08/09/2023 2003   LYMPHSABS 2.2 08/09/2023 1130   MONOABS 1.5 (H) 08/09/2023 1130   EOSABS 0.5 08/09/2023 1130   BASOSABS 0.1 08/09/2023 1130    BMET    Component Value Date/Time   NA 139 08/11/2023 1455   NA 137 10/29/2020 0000   K 4.3 08/11/2023 1455   CL 106 08/11/2023 1455   CO2 21 (L) 08/11/2023 0736   GLUCOSE 108 (H) 08/11/2023 1455   BUN 24 (H) 08/11/2023 1455   CREATININE 2.00 (H) 08/11/2023 1455   CALCIUM 9.4 08/11/2023 0736   GFRNONAA 31 (L) 08/11/2023 0736    INR    Component Value Date/Time   INR 1.1 12/26/2020 0900     Intake/Output Summary (Last 24 hours) at 08/12/2023 1610 Last data filed at 08/12/2023 0400 Gross per 24 hour  Intake 1019.28 ml  Output 550 ml  Net 469.28 ml      Assessment/Plan:  83 y.o. male is s/p:  Aortogram via right CFA with angioplasty and stent of right CIA chronic total occlusion x 2, angioplasty and stent of right EIA high grade stenosis  1 Day Post-Op   -excellent results after stenting of right iliac occlusion.  He has brisk right DP doppler flow.  Groin soft without hematoma -will need staged intervention of right SFA/AK popliteal occlusion as this was not able to be done yesterday due to  contrast volume as well as right femoral sheath in place to treat right iliac artery occlusion.  -plan for angiogram this afternoon.  Discussed npo.   Doreatha Massed, PA-C [facility] [phone] 08/12/2023 6:35 AM   I have seen and evaluated the patient. I agree with the PA note as documented above.  Postprocedure day 1 status post right common iliac stenting x 2 for chronic total occlusion and also right external iliac stenting.  Right femoral pulse palpable.  He states his right foot feels much better today.  Plan to proceed back to the Cath Lab for right SFA intervention with Dr. Lenell Antu.  Updated patient with plan of care.  Creatinine stable today.  Continue aspirin Plavix statin.  After optimization by vascular surgery can proceed with toe amputation.  Cephus Shelling, MD [facility] [place] Office: [phone]

## 2023-08-12 NOTE — Progress Notes (Signed)
 [facility] PROGRESS NOTE    Progress Note  Eric Choi  ZOX:[id number] DOB: 09/30/1940 DOA: 08/09/2023 PCP: Shirline Frees, NP     Brief Narrative:   Eric Choi is an 83 y.o. male past medical history of essential hypertension, hyperlipidemia CAD uncontrolled diabetes mellitus type 2 insulin-dependent sent from the podiatrist office due to right second toe infection and osteomyelitis, care of the foot confirmed lytic lesion of the distal tuft of the second phalange.    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 Started empirically IV doxy and Unasyn. ABIs showed moderate atherosclerotic disease. MRI of the foot showed acute osteomyelitis Vascular surgery was consulted angiogram on 08/11/2023 that showed critical limb ischemia with tissue loss angioplasty and stenting of right common iliac artery and right external iliac artery. Vascular recommended to continue aspirin Plavix and statins.  He was hydrated preprocedure. Vascular recommended angiogram procedure and is going today for intervention of the SFA/AK popliteal occlusion as it cannot be done on 08/11/2023. Podiatry on board awaiting recommendations to see if amputation is needed.  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    Chronic kidney disease stage IIIb: Baseline creatinine around 2, continue bicarbonate tabs. Creatinine stable. Basic metabolic panels pending this morning  CAD/PAD/hyperlipidemia: Continue aspirin and fenofibrate.  Essential hypertension: Hold ACE inhibitor start Norvasc at a lower dose. Continue monitoring blood pressure.   DVT prophylaxis: Lovenox Family Communication:none Status is: Inpatient Remains  inpatient appropriate because: Right foot osteomyelitis    Code Status:     Code Status Orders  (From admission, onward)           Start     Ordered   08/09/23 1822  Full code  Continuous       Question:  By:  Answer:  Consent: discussion documented in EHR   08/09/23 1823           Code Status History     Date Active Date Inactive Code Status Order ID Comments User Context   12/31/2020 1742 01/01/2021 1848 Full Code [id number]  Milinda Antis, PA-C Inpatient         IV Access:   Peripheral IV   Procedures and diagnostic studies:   PERIPHERAL VASCULAR CATHETERIZATION Result Date: 08/11/2023 Images from the original result were not included.   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  Ultrasound-guided access left common femoral artery 2.  CO2 aortogram with catheter selection of aorta 3.  Ultrasound-guided access right common femoral artery for retrograde recanalization of occluded right common iliac artery 4.  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  Angioplasty and stent of the right external iliac artery high-grade stenosis (7 mm x 40 mm Eluvia postdilated with 6 mm and 7 mm Mustang) 6.  Right lower extremity angiogram with CO2 using runoff from right femoral sheath  7.  92 minutes of monitored moderate conscious sedation time  Indications: Patient is an 83 year old male with tissue loss in the right lower extremity with osteomyelitis of his right second toe.  He is scheduled for lower extremity angiogram with possible intervention with a focus on the right leg.  He presents after risk benefits discussed.  Contrast: 50 mL, rest of case with CO2   Findings:  Ultrasound-guided access left common femoral artery.  CO2 aortogram showed patent infrarenal aorta with right common iliac artery occlusion.  The hypogastric on the right was patent.  The right external iliac had a high-grade calcified 90% stenosis.  Distally the right common femoral was patent with some posterior plaque with a patent SFA and profunda bifurcation proximally.  I tried to come antegrade from the aorta with left femoral access to get through the right common iliac occlusion unsuccessfully.  I then accessed the right common femoral artery for retrograde access.  Ultimately I was in a dissection plane and could not get through the true lumen retrograde.  Finally I was able to get a wire down from left femoral access that was using a crossover catheter with a wire in the aorta and got a wire down the right iliac occlusion and the wire was snared from the right femoral sheath for through and through access.  I then placed a 7 [place] Brite tip sheath would not advance through the common iliac occlusion.  The sheath on the right would not advance through the right common iliac artery occlusion.  I had to predilate the right common iliac artery occlsuion with a 4 mm and 5 mm Mustang.  Finally got a Brite tip sheath into the aorta from right femoral access.  I then treated this right common iliac occlusion from the aortic bifurcation down to the hypogastric with a 9 mm x 39 mm VBX and 9 mm x 29 mm VBX.  I then treated the right external iliac high-grade stenosis just distal to the hypogastric with a 7 mm x 40 mm Eluvia initially postdilated to 6 mm and then I upsized to a 7 mm angioplasty balloon to get more luminal gain.  At completion stents were widely patent with filling of the hypogastric.  I did get right lower extremity runoff with CO2 showing distal SFA/ above knee popliteal occlusion and patent below knee popliteal occlusion with two-vessel runoff and anterior tibial and posterior tibial.              Procedure:  The patient was identified in the holding area and taken to room 8.  The patient was then placed supine on the table and prepped and draped in the usual sterile fashion.  A time out was called.  The patient received Versed and fentanyl for conscious moderate sedation.  Vital signs were monitored including heart rate, respiratory rate, oxygenation and blood pressure.  I was present for all moderate sedation.  Ultrasound was used to evaluate the left common femoral artery.  It was patent .  A digital ultrasound image was acquired.  A micropuncture needle was used to access the left common femoral artery under ultrasound guidance.  An 018 wire was advanced without resistance and a micropuncture sheath was placed.  The 018 wire was removed and a benson wire was placed.  The micropuncture sheath was exchanged for a 5 french sheath.  An omniflush catheter was advanced over the wire to the level of L-1.  An abdominal angiogram was obtained with CO2.  This demonstrated right common iliac occlusion as noted above.  I tried to use an Omni Flush catheter with a Glidewire to get down the right iliac occlusion antegrade from left femoral access with a wire and catheter in the aorta.  This initially was unsuccessful.  I then accessed the right common femoral artery with ultrasound using a micro access needle placed a microwire and a microsheath and then a Bentson wire and a short 5 [place] sheath.  I then used a guidewire advantage with a BER 2 catheter to come retrograde and ultimately upsized to a 7 [place] Brite tip sheath.  The patient was given 100 units/kg IV heparin.  ACT was checked during the case.   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  Also tried a glide cath unsuccessfully.  I then came back from our left femoral sheath with a crossover  catheter and a Glidewire and finally I was able to get a wire down through the right common iliac occlusion antegrade into the right external iliac artery.  I then snared this wire from right femoral sheath and had through and through access with a 4 French snare.  I tried advancing the 7 French sheath over the wire from the right groin into the aorta but this would not advance.  I then advanced a bare 2 catheter exchanged for a Glidewire advantage for more support to the right femoral sheath.  I had to treat the right common iliac occlusion with a 4 mm and 5 mm Mustang's before I could get the sheath to advance as this would not advance without predilation given the chronic total occlusion.  Finally I selected a 9 mm x 39 mm VBX deployed in the proximal right common iliac at the aortic bifurcation and had to extend with a second 9 mm x 29 mm VBX with preservation of the hypogastric.  I then got a planning image and went ahead and extended with a 7 mm x 40 mm drug-coated Eluvia in the right external iliac artery just beyond the hypogastric.  This was postdilated with a 6 mm Mustang but I had over 30% residual stenosis so I upsized to a 7 mm Mustang.  Much more satisfied with the results.  After stenting the right iliac, flow down the right iliac was now as quick as the left side with no significant residual stenosis in the iliac.  I then elected to shoot the runoff in the right leg with CO2 given we already used 50 cc of contrast with his chronic kidney disease.  Pertinent findings are noted above.  He will need staged intervention for his right SFA occlusion.  Wires and catheters were removed.  Will be taken holding for sheath removal.  Plan: Excellent results after stenting of right iliac occlusion today.  Aspirin, plavix, statin.  Will need staged intervention on the right SFA/above knee popliteal occlusion.  Unable to do this today due to contrast volume as well as right femoral sheath in place to treat right  iliac occlusion.    Cephus Shelling, MD [facility] [place] Office: [phone]      Medical Consultants:   None.   Subjective:    [name] relates his pain is controlled.  Objective:    Vitals:   08/12/23 0400 08/12/23 0546 08/12/23 0600 08/12/23 1914  BP: 139/82 (!) 147/77  (!) 166/70  Pulse:  97 100   Resp: 16 20 19    Temp: 97.9 F (36.6 C) 97.6 F (36.4 C)  98 F (36.7 C)  TempSrc: Oral Oral  Oral  SpO2:  98% 98%   Weight:      Height:       SpO2: 98 % O2 Flow Rate (L/min): 2 L/min   Intake/Output Summary (Last 24 hours) at 08/12/2023 0951 Last data filed at 08/12/2023 0400 Gross per 24 hour  Intake 1019.28 ml  Output 550 ml  Net 469.28 ml   Filed Weights   08/09/23 1128 08/09/23 1928 08/11/23 1825  Weight: 106.8 kg 107.7 kg 103.7 kg    Exam: General exam: In no acute distress. Respiratory system: Good air movement and clear to auscultation. Cardiovascular system: S1 & S2 heard, RRR. No JVD. Gastrointestinal system: Abdomen is nondistended, soft and nontender.  Extremities: No pedal edema. Skin: No rashes, lesions or ulcers Psychiatry: Judgement and insight appear normal. Mood & affect appropriate.  Data Reviewed:    Labs: Basic Metabolic Panel: Recent Labs  Lab 08/09/23 1130 08/09/23 2003 08/11/23 0736 08/11/23 1455  NA 138  --  139 139  K 4.8  --  4.4 4.3  CL 106  --  108 106  CO2 23  --  21*  --   GLUCOSE 155*  --  79 108*  BUN 28*  --  27* 24*  CREATININE 2.34* 2.34* 2.13* 2.00*  CALCIUM 9.5  --  9.4  --    GFR Estimated Creatinine Clearance: 33.8 mL/min (A) (by C-G formula based on SCr of 2 mg/dL (H)). Liver Function Tests: Recent Labs  Lab 08/09/23 1130  AST 17  ALT 16  ALKPHOS 51  BILITOT 0.5  PROT 7.0  ALBUMIN 3.3*   No results for input(s): "LIPASE", "AMYLASE" in the last 168 hours. No results for input(s): "AMMONIA" in the last 168 hours. Coagulation profile No results for  input(s): "INR", "PROTIME" in the last 168 hours. COVID-19 Labs  No results for input(s): "DDIMER", "FERRITIN", "LDH", "CRP" in the last 72 hours.  Lab Results  Component Value Date   SARSCOV2NAA RESULT: NEGATIVE 12/29/2020    CBC: Recent Labs  Lab 08/09/23 1130 08/09/23 2003 08/11/23 1455  WBC 13.8* 12.2*  --   NEUTROABS 9.5*  --   --   HGB 13.4 13.1 13.9  HCT 42.1 39.8 41.0  MCV 91.1 88.1  --   PLT 309 279  --    Cardiac Enzymes: No results for input(s): "CKTOTAL", "CKMB", "CKMBINDEX", "TROPONINI" in the last 168 hours. BNP (last 3 results) No results for input(s): "PROBNP" in the last 8760 hours. CBG: Recent Labs  Lab 08/11/23 1541 08/11/23 1737 08/11/23 2105 08/12/23 0022 08/12/23 0739  GLUCAP 96 101* 172* 176* 112*   D-Dimer: No results for input(s): "DDIMER" in the last 72 hours. Hgb A1c: No results for input(s): "HGBA1C" in the last 72 hours. Lipid Profile: Recent Labs    08/12/23 0417  CHOL 75  HDL 29*  LDLCALC 24  TRIG 956  CHOLHDL 2.6   Thyroid function studies: No results for input(s): "TSH", "T4TOTAL", "T3FREE", "THYROIDAB" in the last 72 hours.  Invalid input(s): "FREET3" Anemia work up: No results for input(s): "VITAMINB12", "FOLATE", "FERRITIN", "TIBC", "IRON", "RETICCTPCT" in the last 72 hours. Sepsis Labs: Recent Labs  Lab 08/09/23 1130 08/09/23 1145 08/09/23 2003  WBC 13.8*  --  12.2*  LATICACIDVEN  --  0.8  --  Microbiology Recent Results (from the past 240 hours)  MRSA Next Gen by PCR, Nasal     Status: None   Collection Time: 08/09/23  7:44 PM   Specimen: Nasal Mucosa; Nasal Swab  Result Value Ref Range Status   MRSA by PCR Next Gen NOT DETECTED NOT DETECTED Final    Comment: (NOTE) The GeneXpert MRSA Assay (FDA approved for NASAL specimens only), is one component of a comprehensive MRSA colonization surveillance program. It is not intended to diagnose MRSA infection nor to guide or monitor treatment for MRSA  infections. Test performance is not FDA approved in patients less than 6 years old. Performed at [facility], [address], [place], [place] [address]      Medications:    amLODipine  5 mg Oral Daily   aspirin EC  81 mg Oral Daily   atorvastatin  40 mg Oral Daily   cholecalciferol  1,000 Units Oral Daily   clopidogrel  75 mg Oral Q breakfast   enoxaparin (LOVENOX) injection  30 mg Subcutaneous Q24H   ezetimibe  10 mg Oral Daily   gemfibrozil  300 mg Oral Q1400   insulin aspart  0-5 Units Subcutaneous QHS   insulin aspart  0-9 Units Subcutaneous TID WC   insulin aspart  2 Units Subcutaneous TID WC   insulin glargine-yfgn  40 Units Subcutaneous BID   metoprolol tartrate  50 mg Oral Daily   polyethylene glycol  17 g Oral BID   sodium bicarbonate  650 mg Oral BID   sodium chloride flush  3 mL Intravenous Q12H   Continuous Infusions:  sodium chloride     ampicillin-sulbactam (UNASYN) IV 1.5 g (08/12/23 0158)   doxycycline (VIBRAMYCIN) IV Stopped (08/12/23 0117)      LOS: 3 days   Marinda Elk  [facility]  08/12/2023, 9:51 AM

## 2023-08-12 NOTE — Progress Notes (Signed)
   PODIATRY PROGRESS NOTE Patient Name: Eric Choi  DOB [birth year] DOA 08/09/2023  Hospital Day: 4  Assessment:  83 y.o. male with PMHx significant for  hypertension, hyperlipidemia CAD uncontrolled diabetes mellitus type 2 insulin-dependent  with ulceration of the right 2nd toe with underlying osteomyelitis   AF, VSS WBC 12.2 XR foot R: Lytic destruction involving distal tuft of second distal phalanx consistent with  osteomyelitis with overlying soft tissue ulceration. MRI R foot:  Ulceration at the distal aspect of the second toe. Marrow edema throughout the distal phalanx of the second toe with mild distal cortical erosion consistent with acute osteomyelitis.  Plan:  - NPO p MN 3/31 for OR Monday timing TBD for R 2nd toe amputation - Appreciate vascular surgery. Plan for staged intervention today. NPO - Continue IV abx pending toe amp - WBAT Will continue to follow        Corinna Gab, DPM [facility]    Subjective:  Pt seen, discussed plan for amputation tmrw vs Monday. He is aware of plans.   Objective:   Vitals:   08/12/23 0738 08/12/23 0952  BP: (!) 166/70 (!) 163/64  Pulse:  93  Resp:    Temp: 98 F (36.7 C)   SpO2:         Latest Ref Rng & Units 08/11/2023    2:55 PM 08/09/2023    8:03 PM 08/09/2023   11:30 AM  CBC  WBC 4.0 - 10.5 K/uL  12.2  13.8   Hemoglobin 13.0 - 17.0 g/dL 62.9  52.8  41.3   Hematocrit 39.0 - 52.0 % 41.0  39.8  42.1   Platelets 150 - 400 K/uL  279  309        Latest Ref Rng & Units 08/12/2023   10:04 AM 08/11/2023    2:55 PM 08/11/2023    7:36 AM  BMP  Glucose 70 - 99 mg/dL 95  244  79   BUN 8 - 23 mg/dL 24  24  27    Creatinine 0.61 - 1.24 mg/dL 0.10  2.72  5.36   Sodium 135 - 145 mmol/L 141  139  139   Potassium 3.5 - 5.1 mmol/L 4.0  4.3  4.4   Chloride 98 - 111 mmol/L 108  106  108   CO2 22 - 32 mmol/L 21   21   Calcium 8.9 - 10.3 mg/dL 9.1   9.4     General: AAOx3, NAD  Lower Extremity Exam  Right  foot erythema of 2nd toe extending to MPJ level. Pain on palpation. Non palpable DP and pt pulse RLE   ABI PVR Right: Resting right ankle-brachial index indicates moderate right lower  extremity arterial disease. The right toe-brachial index is abnormal.   Left: Resting left ankle-brachial index indicates mild left lower extremity arterial disease. The left toe-brachial index is abnormal.   Radiology:  Results reviewed. See assessment for pertinent imaging results

## 2023-08-13 DIAGNOSIS — M86171 Other acute osteomyelitis, right ankle and foot: Secondary | ICD-10-CM | POA: Diagnosis not present

## 2023-08-13 DIAGNOSIS — N189 Chronic kidney disease, unspecified: Secondary | ICD-10-CM | POA: Diagnosis not present

## 2023-08-13 DIAGNOSIS — M869 Osteomyelitis, unspecified: Secondary | ICD-10-CM | POA: Diagnosis not present

## 2023-08-13 DIAGNOSIS — I739 Peripheral vascular disease, unspecified: Secondary | ICD-10-CM | POA: Diagnosis not present

## 2023-08-13 LAB — BASIC METABOLIC PANEL WITH GFR
Anion gap: 8 (ref 5–15)
BUN: 25 mg/dL — ABNORMAL HIGH (ref 8–23)
CO2: 22 mmol/L (ref 22–32)
Calcium: 8.7 mg/dL — ABNORMAL LOW (ref 8.9–10.3)
Chloride: 110 mmol/L (ref 98–111)
Creatinine, Ser: 2.14 mg/dL — ABNORMAL HIGH (ref 0.61–1.24)
GFR, Estimated: 30 mL/min — ABNORMAL LOW (ref >60)
Glucose, Bld: 116 mg/dL — ABNORMAL HIGH (ref 70–99)
Potassium: 4.3 mmol/L (ref 3.5–5.1)
Sodium: 140 mmol/L (ref 135–145)

## 2023-08-13 LAB — GLUCOSE, CAPILLARY
Glucose-Capillary: 76 mg/dL (ref 70–99)
Glucose-Capillary: 166 mg/dL — ABNORMAL HIGH (ref 70–99)
Glucose-Capillary: 96 mg/dL (ref 70–99)
Glucose-Capillary: 162 mg/dL — ABNORMAL HIGH (ref 70–99)

## 2023-08-13 LAB — LIPID PANEL
Cholesterol: 72 mg/dL (ref 0–200)
HDL: 28 mg/dL — ABNORMAL LOW (ref >40)
LDL Cholesterol: 19 mg/dL (ref 0–99)
Total CHOL/HDL Ratio: 2.6 ratio
Triglycerides: 124 mg/dL (ref <150)
VLDL: 25 mg/dL (ref 0–40)

## 2023-08-13 MED ORDER — INSULIN GLARGINE-YFGN 100 UNIT/ML ~~LOC~~ SOLN
20.0000 [IU] | Freq: Two times a day (BID) | SUBCUTANEOUS | Status: DC
  Administered 2023-08-13 – 2023-08-16 (×7): 20 [IU] via SUBCUTANEOUS
  Filled 2023-08-13 (×8): qty 0.2

## 2023-08-13 NOTE — Progress Notes (Signed)
 [facility] PROGRESS NOTE    Progress Note  Eric Choi  QMV:[id number] DOB: 01/17/1941 DOA: 08/09/2023 PCP: Shirline Frees, NP     Brief Narrative:   Eric Choi is an 83 y.o. male past medical history of essential hypertension, hyperlipidemia CAD uncontrolled diabetes mellitus type 2 insulin-dependent sent from the podiatrist office due to right second toe infection and osteomyelitis, care of the foot confirmed lytic lesion of the distal tuft of the second phalange.    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 Started empirically IV doxy and Unasyn. ABIs showed moderate atherosclerotic disease. MRI of the foot showed acute osteomyelitis Vascular surgery was consulted angiogram and intervention on 08/11/2023 and 08/12/2023, with angioplasty and stenting . Vascular recommended to continue aspirin Plavix and statins.  . 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  Chronic kidney disease stage IIIb: Baseline creatinine around 2, continue bicarbonate tabs. Creatinine has remained stable. Continue to monitor.  CAD/PAD/hyperlipidemia: Continue aspirin and fenofibrate.  Essential hypertension: Hold ACE inhibitor start Norvasc at a lower dose. Continue monitoring blood pressure.   DVT prophylaxis: Lovenox Family Communication:none Status is: Inpatient Remains inpatient appropriate because: Right foot osteomyelitis    Code Status:     Code Status Orders  (From admission, onward)           Start     Ordered   08/09/23 1822  Full code  Continuous       Question:  By:  Answer:  Consent: discussion documented in EHR   08/09/23 1823           Code Status History     Date Active Date  Inactive Code Status Order ID Comments User Context   12/31/2020 1742 01/01/2021 1848 Full Code [id number]  Milinda Antis, PA-C Inpatient         IV Access:   Peripheral IV   Procedures and diagnostic studies:   PERIPHERAL VASCULAR CATHETERIZATION Result Date: 08/12/2023 Table formatting from the original result was not included. DATE OF SERVICE: 08/12/2023  PATIENT:  Eric Choi  83 y.o. male  PRE-OPERATIVE DIAGNOSIS:  Atherosclerosis of native arteries of right lower extremity causing ulceration  POST-OPERATIVE DIAGNOSIS:  Same  PROCEDURE:  1) Ultrasound guided left common femoral artery access 2) Right lower extremity angiogram with third order cannulation 3) Right femoropopliteal intravascular lithotripsy (5x57mm Shockwave) 3) Right femoropopliteal angioplasty and stenting (7x169mm Eluvia) 4) Conscious sedation (83 minutes)  SURGEON:  Rande Brunt. Lenell Antu, MD  ASSISTANT: none  ANESTHESIA:   local and IV sedation  ESTIMATED BLOOD LOSS: minimal  LOCAL MEDICATIONS USED:  LIDOCAINE  COUNTS: confirmed correct.  PATIENT DISPOSITION:  PACU - hemodynamically stable.  Delay start of Pharmacological VTE agent (>24hrs) due to surgical blood loss or risk of bleeding: no  INDICATION FOR PROCEDURE: Eric Choi is a 83 y.o. 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 After careful discussion of risks, benefits, and alternatives the patient was offered angiography. The patient understood and wished to proceed.  OPERATIVE FINDINGS:  Left renal artery: not studied Right renal artery: not studied Infrarenal aorta: not studied Left common iliac artery: not studied Right common iliac artery: not studied Left internal iliac artery: not studied Right internal iliac artery: not studied Left external  iliac artery: not studied Right external iliac artery: not studied Left common femoral artery: not studied Right common femoral artery: patent Left  profunda femoris artery: not studied Right profunda femoris artery: patent Left superficial femoral artery: not studied Right superficial femoral artery: patent proximally; occluded distally at [place]. Heavily calcified plaque distally Left popliteal artery: not studied Right popliteal artery: reconstitutes above the knee; patent behind and below the knee Left anterior tibial artery: not studied Right anterior tibial artery: patent to the foot Left tibioperoneal trunk: not studied Right tibioperoneal trunk: patent Left peroneal artery: not studied Right peroneal artery: patent to the ankle Left posterior tibial artery: not studied Right posterior tibial artery: patent proximally, occludes distally Left pedal circulation: not studied Right pedal circulation: fills via AT  DESCRIPTION OF PROCEDURE: After identification of the patient in the pre-operative holding area, the patient was transferred to the operating room. The patient was positioned supine on the operating room table. Anesthesia was induced. The groins was prepped and draped in standard fashion. A surgical pause was performed confirming correct patient, procedure, and operative location.  The left groin was anesthetized with subcutaneous injection of 1% lidocaine. Using ultrasound guidance, the left common femoral artery was accessed with micropuncture technique. Fluoroscopy was used to confirm cannulation over the femoral head. The 449F micropuncture sheath was upsized to 49F.  The right common iliac artery was selected with an omniflush catheter and glidewire advantage guidewire. The wire was advanced into the common femoral artery. Over the wire the omni flush catheter was advanced into the external iliac artery. Selective angiography was performed - see above for details.  The decision was made to intervene. The patient was heparinized with 10,000 units of heparin. The 49F sheath was exchanged for a 61F x 45cm sheath. Selective angiography of the  left lower extremity was performed prior to intervention.  The case was highly challenging.  His iliac anatomy was very tortuous.  His obese groins made delivering a sheath into the external iliac artery challenging.  Ultimately, I was able to cross the occluded femoral-popliteal segment with a Glidewire and quick cross catheter.  During my initial intervention, I elected to use a 7 x 150 mm Eluvia to primarily stent the lesion as it was heavily calcified.  The stent fractured during deployment and had to be carefully retrieved.  About 20 mm of the stent remained and deployed, I was able to retrieve the rest.  I then performed balloon assisted intravascular lithotripsy of the lesion.  After this and angioplasty with a 5 x 150 mm Mustang balloon, I was able to cross the lesion without difficulty with an additional 7 x 150 mm Eluvia.  This was postdilated with a 6 x 150 mm Mustang balloon.  Completion angiogram showed recanalization of the femoral-popliteal occlusion.  During the course of the case a small hematoma acuminate in the groin.  Elected to remove all investor equipment and hold pressure in the room to obtain hemostasis.  Heparin was reversed with protamine.  Conscious sedation was administered with the use of IV fentanyl and midazolam under continuous physician and nurse monitoring.  Heart rate, blood pressure, and oxygen saturation were continuously monitored.  Total sedation time was 83 minutes  Upon completion of the case instrument and sharps counts were confirmed correct. The patient was transferred to the PACU in good condition. I was present for all portions of the procedure.  PLAN: Aspirin, Plavix, statin therapies.  Follow-up with me or Dr. Chestine Spore in 4  weeks with ankle-brachial index and arterial duplex.  Fractured stent is covered by normal, intact stent; no specific attention needed to this in follow-up.  Rande Brunt. Lenell Antu, MD [facility] Phone Number:  [phone] 08/12/2023 5:01 PM   PERIPHERAL VASCULAR CATHETERIZATION Result Date: 08/11/2023 Images from the original result were not included.   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  Ultrasound-guided access left common femoral artery 2.  CO2 aortogram with catheter selection of aorta 3.  Ultrasound-guided access right common femoral artery for retrograde recanalization of occluded right common iliac artery 4.  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  Angioplasty and stent of the right external iliac artery high-grade stenosis (7 mm x 40 mm Eluvia postdilated with 6 mm and 7 mm Mustang) 6.  Right lower extremity angiogram with CO2 using runoff from right femoral sheath 7.  92 minutes of monitored moderate conscious sedation time  Indications: Patient is an 83 year old male with tissue loss in the right lower extremity with osteomyelitis of his right second toe.  He is scheduled for lower extremity angiogram with possible intervention with a focus on the right leg.  He presents after risk benefits discussed.  Contrast: 50 mL, rest of case with CO2  Findings:  Ultrasound-guided access left common femoral artery.  CO2 aortogram showed patent infrarenal aorta with right common iliac artery occlusion.  The hypogastric on the right was patent.  The right external iliac had a high-grade calcified 90% stenosis.  Distally the right common femoral was patent with some posterior plaque with a patent SFA and profunda bifurcation proximally.  I tried to come antegrade from the aorta with left femoral access to get through the right common iliac occlusion unsuccessfully.  I then accessed the  right common femoral artery for retrograde access.  Ultimately I was in a dissection plane and could not get through the true lumen retrograde.  Finally I was able to get a wire down from left femoral access that was using a crossover catheter with a wire in the aorta and got a wire down the right iliac occlusion and the wire was snared from the right femoral sheath for through and through access.  I then placed a 7 [place] Brite tip sheath would not advance through the common iliac occlusion.  The sheath on the right would not advance through the right common iliac artery occlusion.  I had to predilate the right common iliac artery occlsuion with a 4 mm and 5 mm Mustang.  Finally got a Brite tip sheath into the aorta from right femoral access.  I then treated this right common iliac occlusion from the aortic bifurcation down to the hypogastric with a 9 mm x 39 mm VBX and 9 mm x 29 mm VBX.  I then treated the right external iliac high-grade stenosis just distal to the hypogastric with a 7 mm x 40 mm Eluvia initially postdilated to 6 mm and then I upsized to a 7 mm angioplasty balloon to get more luminal  gain.  At completion stents were widely patent with filling of the hypogastric.  I did get right lower extremity runoff with CO2 showing distal SFA/ above knee popliteal occlusion and patent below knee popliteal occlusion with two-vessel runoff and anterior tibial and posterior tibial.             Procedure:  The patient was identified in the holding area and taken to room 8.  The patient was then placed supine on the table and prepped and draped in the usual sterile fashion.  A time out was called.  The patient received Versed and fentanyl for conscious moderate sedation.  Vital signs were monitored including heart rate, respiratory rate, oxygenation and blood pressure.  I was present for all moderate sedation.  Ultrasound was used to evaluate the left common femoral artery.  It was patent .  A digital ultrasound  image was acquired.  A micropuncture needle was used to access the left common femoral artery under ultrasound guidance.  An 018 wire was advanced without resistance and a micropuncture sheath was placed.  The 018 wire was removed and a benson wire was placed.  The micropuncture sheath was exchanged for a 5 french sheath.  An omniflush catheter was advanced over the wire to the level of L-1.  An abdominal angiogram was obtained with CO2.  This demonstrated right common iliac occlusion as noted above.  I tried to use an Omni Flush catheter with a Glidewire to get down the right iliac occlusion antegrade from left femoral access with a wire and catheter in the aorta.  This initially was unsuccessful.  I then accessed the right common femoral artery with ultrasound using a micro access needle placed a microwire and a microsheath and then a Bentson wire and a short 5 [place] sheath.  I then used a guidewire advantage with a BER 2 catheter to come retrograde and ultimately upsized to a 7 [place] Brite tip sheath.  The patient was given 100 units/kg IV heparin.  ACT was checked during the case.   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  Also tried a glide cath unsuccessfully.  I then came back from our left femoral sheath with a crossover catheter and a Glidewire and finally I was able to get a wire down through the right common iliac occlusion antegrade into the right external iliac artery.  I then snared this wire from right femoral sheath and had through and through access with a 4 French snare.  I tried advancing the 7 French sheath over the wire from the right groin into the aorta but this would not advance.  I then advanced a bare 2 catheter exchanged for a Glidewire advantage for more support to the right femoral sheath.  I had to treat the right common iliac  occlusion with a 4 mm and 5 mm Mustang's before I could get the sheath to advance as this would not advance without predilation given the chronic total occlusion.  Finally I selected a 9 mm x 39 mm VBX deployed in the proximal right common iliac at the aortic bifurcation and had to extend with a second 9 mm x 29 mm VBX with preservation of the hypogastric.  I then got a planning image and went ahead and extended with a 7 mm x 40 mm drug-coated Eluvia in the  right external iliac artery just beyond the hypogastric.  This was postdilated with a 6 mm Mustang but I had over 30% residual stenosis so I upsized to a 7 mm Mustang.  Much more satisfied with the results.  After stenting the right iliac, flow down the right iliac was now as quick as the left side with no significant residual stenosis in the iliac.  I then elected to shoot the runoff in the right leg with CO2 given we already used 50 cc of contrast with his chronic kidney disease.  Pertinent findings are noted above.  He will need staged intervention for his right SFA occlusion.  Wires and catheters were removed.  Will be taken holding for sheath removal.  Plan: Excellent results after stenting of right iliac occlusion today.  Aspirin, plavix, statin.  Will need staged intervention on the right SFA/above knee popliteal occlusion.  Unable to do this today due to contrast volume as well as right femoral sheath in place to treat right iliac occlusion.    Cephus Shelling, MD [facility] [place] Office: [phone]      Medical Consultants:   None.   Subjective:    Eric Choi pain is controlled no complaints  Objective:    Vitals:   08/12/23 1939 08/12/23 2334 08/13/23 0340 08/13/23 0740  BP: 134/65 116/60 (!) 147/65 (!) 140/69  Pulse: 90 83 92 89  Resp: 16 16 19 14   Temp: 97.9 F (36.6 C) 98.4 F (36.9 C) 98 F (36.7 C) 98.5 F (36.9 C)  TempSrc: Oral Oral Oral Oral  SpO2: 96% 97% 99% 100%  Weight:       Height:       SpO2: 100 % O2 Flow Rate (L/min): 2 L/min   Intake/Output Summary (Last 24 hours) at 08/13/2023 1105 Last data filed at 08/13/2023 1102 Gross per 24 hour  Intake 1432.27 ml  Output 1500 ml  Net -67.73 ml   Filed Weights   08/09/23 1128 08/09/23 1928 08/11/23 1825  Weight: 106.8 kg 107.7 kg 103.7 kg    Exam: General exam: In no acute distress. Respiratory system: Good air movement and clear to auscultation. Cardiovascular system: S1 & S2 heard, RRR. No JVD. Gastrointestinal system: Abdomen is nondistended, soft and nontender.  Extremities: No pedal edema. Skin: No rashes, lesions or ulcers Psychiatry: Judgement and insight appear normal. Mood & affect appropriate.  Data Reviewed:    Labs: Basic Metabolic Panel: Recent Labs  Lab 08/09/23 1130 08/09/23 2003 08/11/23 0736 08/11/23 1455 08/12/23 1004 08/13/23 0313  NA 138  --  139 139 141 140  K 4.8  --  4.4 4.3 4.0 4.3  CL 106  --  108 106 108 110  CO2 23  --  21*  --  21* 22  GLUCOSE 155*  --  79 108* 95 116*  BUN 28*  --  27* 24* 24* 25*  CREATININE 2.34* 2.34* 2.13* 2.00* 2.10* 2.14*  CALCIUM 9.5  --  9.4  --  9.1 8.7*   GFR Estimated Creatinine Clearance: 31.6 mL/min (A) (by C-G formula based on SCr of 2.14 mg/dL (H)). Liver Function Tests: Recent Labs  Lab 08/09/23 1130  AST 17  ALT 16  ALKPHOS 51  BILITOT 0.5  PROT 7.0  ALBUMIN 3.3*   No results for input(s): "LIPASE", "AMYLASE" in the last 168 hours. No results for input(s): "AMMONIA" in the last 168 hours. Coagulation profile No results for input(s): "INR", "PROTIME" in the last  168 hours. COVID-19 Labs  No results for input(s): "DDIMER", "FERRITIN", "LDH", "CRP" in the last 72 hours.  Lab Results  Component Value Date   SARSCOV2NAA RESULT: NEGATIVE 12/29/2020    CBC: Recent Labs  Lab 08/09/23 1130 08/09/23 2003 08/11/23 1455  WBC 13.8* 12.2*  --   NEUTROABS 9.5*  --   --   HGB 13.4 13.1 13.9  HCT 42.1 39.8 41.0   MCV 91.1 88.1  --   PLT 309 279  --    Cardiac Enzymes: No results for input(s): "CKTOTAL", "CKMB", "CKMBINDEX", "TROPONINI" in the last 168 hours. BNP (last 3 results) No results for input(s): "PROBNP" in the last 8760 hours. CBG: Recent Labs  Lab 08/12/23 1523 08/12/23 1653 08/12/23 1747 08/12/23 2106 08/13/23 0824  GLUCAP 108* 112* 119* 162* 76   D-Dimer: No results for input(s): "DDIMER" in the last 72 hours. Hgb A1c: No results for input(s): "HGBA1C" in the last 72 hours. Lipid Profile: Recent Labs    08/12/23 0417 08/13/23 0313  CHOL 75 72  HDL 29* 28*  LDLCALC 24 19  TRIG 110 124  CHOLHDL 2.6 2.6   Thyroid function studies: No results for input(s): "TSH", "T4TOTAL", "T3FREE", "THYROIDAB" in the last 72 hours.  Invalid input(s): "FREET3" Anemia work up: No results for input(s): "VITAMINB12", "FOLATE", "FERRITIN", "TIBC", "IRON", "RETICCTPCT" in the last 72 hours. Sepsis Labs: Recent Labs  Lab 08/09/23 1130 08/09/23 1145 08/09/23 2003  WBC 13.8*  --  12.2*  LATICACIDVEN  --  0.8  --    Microbiology Recent Results (from the past 240 hours)  MRSA Next Gen by PCR, Nasal     Status: None   Collection Time: 08/09/23  7:44 PM   Specimen: Nasal Mucosa; Nasal Swab  Result Value Ref Range Status   MRSA by PCR Next Gen NOT DETECTED NOT DETECTED Final    Comment: (NOTE) The GeneXpert MRSA Assay (FDA approved for NASAL specimens only), is one component of a comprehensive MRSA colonization surveillance program. It is not intended to diagnose MRSA infection nor to guide or monitor treatment for MRSA infections. Test performance is not FDA approved in patients less than 70 years old. Performed at [facility], [address] [place], [place] [address]      Medications:    amLODipine  5 mg Oral Daily   aspirin EC  81 mg Oral Daily   atorvastatin  40 mg Oral Daily   cholecalciferol  1,000 Units Oral Daily   clopidogrel  75 mg Oral Q breakfast    enoxaparin (LOVENOX) injection  40 mg Subcutaneous Q24H   ezetimibe  10 mg Oral Daily   gemfibrozil  300 mg Oral Q1400   insulin aspart  0-5 Units Subcutaneous QHS   insulin aspart  0-9 Units Subcutaneous TID WC   insulin aspart  2 Units Subcutaneous TID WC   insulin glargine-yfgn  20 Units Subcutaneous BID   metoprolol tartrate  50 mg Oral Daily   sodium bicarbonate  650 mg Oral BID   sodium chloride flush  3 mL Intravenous Q12H   sodium chloride flush  3 mL Intravenous Q12H   Continuous Infusions:  sodium chloride     ampicillin-sulbactam (UNASYN) IV 1.5 g (08/13/23 0926)   doxycycline (VIBRAMYCIN) IV Stopped (08/13/23 0102)      LOS: 4 days   Marinda Elk  [facility]  08/13/2023, 11:05 AM

## 2023-08-13 NOTE — Progress Notes (Signed)
  Progress Note    08/13/2023 10:59 AM 1 Day Post-Op  Subjective:  no complaints; denies pain. Report foot feels ok  afebrile  Vitals:   08/13/23 0340 08/13/23 0740  BP: (!) 147/65 (!) 140/69  Pulse: 92 89  Resp: 19 14  Temp: 98 F (36.7 C) 98.5 F (36.9 C)  SpO2: 99% 100%    Physical Exam: No acute distress HDS, nonlabored breathing Bilateral groins without hematoma Palpable R DP  CBC    Component Value Date/Time   WBC 12.2 (H) 08/09/2023 2003   RBC 4.52 08/09/2023 2003   HGB 13.9 08/11/2023 1455   HCT 41.0 08/11/2023 1455   PLT 279 08/09/2023 2003   MCV 88.1 08/09/2023 2003   MCH 29.0 08/09/2023 2003   MCHC 32.9 08/09/2023 2003   RDW 14.6 08/09/2023 2003   LYMPHSABS 2.2 08/09/2023 1130   MONOABS 1.5 (H) 08/09/2023 1130   EOSABS 0.5 08/09/2023 1130   BASOSABS 0.1 08/09/2023 1130    BMET    Component Value Date/Time   NA 140 08/13/2023 0313   NA 137 10/29/2020 0000   K 4.3 08/13/2023 0313   CL 110 08/13/2023 0313   CO2 22 08/13/2023 0313   GLUCOSE 116 (H) 08/13/2023 0313   BUN 25 (H) 08/13/2023 0313   CREATININE 2.14 (H) 08/13/2023 0313   CALCIUM 8.7 (L) 08/13/2023 0313   GFRNONAA 30 (L) 08/13/2023 0313    INR    Component Value Date/Time   INR 1.1 12/26/2020 0900     Intake/Output Summary (Last 24 hours) at 08/13/2023 1059 Last data filed at 08/13/2023 0342 Gross per 24 hour  Intake 1432.27 ml  Output 1150 ml  Net 282.27 ml      Assessment/Plan:  83 y.o. male is s/p:  2 days postop from Aortogram via right CFA with angioplasty and stent of right CIA chronic total occlusion x 2, angioplasty and stent of right EIA high grade stenosis  Now 1 Day Post-Op from R SFA/Pop angioplasty and stenting  Recovering well, bilateral groins are fine Inline flow to the foot via the AT, palpable DP on exam Okay to proceed with right toe amputation with podiatry  Daria Pastures MD [facility] Phone Number:  [phone] 08/13/2023 11:02 AM

## 2023-08-13 NOTE — Progress Notes (Signed)
 PHARMACIST LIPID MONITORING   Eric Choi is a 83 y.o. male admitted on 08/09/2023 with osteomyelitis.  Pharmacy has been consulted to optimize lipid-lowering therapy with the indication of secondary prevention for clinical ASCVD.  Recent Labs:  Lipid Panel (last 6 months):   Lab Results  Component Value Date   CHOL 72 08/13/2023   TRIG 124 08/13/2023   HDL 28 (L) 08/13/2023   CHOLHDL 2.6 08/13/2023   VLDL 25 08/13/2023   LDLCALC 19 08/13/2023    Hepatic function panel (last 6 months):   Lab Results  Component Value Date   AST 17 08/09/2023   ALT 16 08/09/2023   ALKPHOS 51 08/09/2023   BILITOT 0.5 08/09/2023    SCr (since admission):   Serum creatinine: 2.14 mg/dL (H) 02/72/53 6644 Estimated creatinine clearance: 31.6 mL/min (A) The ASCVD Risk score (Arnett DK, et al., 2019) failed to calculate for the following reasons:   The 2019 ASCVD risk score is only valid for ages 69 to 49   Current therapy and lipid therapy tolerance Current lipid-lowering therapy: atorvastatin 40mg  daily, ezetimibe 10mg  daily, gemfibrozil 300mg  daily Previous lipid-lowering therapies (if applicable): n/a Documented or reported allergies or intolerances to lipid-lowering therapies (if applicable): n/a  Assessment:   83YOM with PMH of CAD, HLD, PAD, HTN, T2DM, CKD4 presented with osteomyelitis. Pharmacy consulted to assess current lipid therapy.   Patient's LDL 19 at goal.  Current goal <55 due to previous ASCVD event (MI) and PMH of DM, HTN, and former smoker.  Plan:    1.Statin intensity (high intensity recommended for all patients regardless of the LDL):  No statin changes. The patient is already on a high intensity statin. (Atorvastatin 40mg  daily)  2.Add ezetimibe (if any one of the following): patient on ezetimibe PTA  3.Refer to lipid clinic:   No  4.Follow-up with:  Primary care provider - Shirline Frees, NP  5.Follow-up labs after discharge:  No changes in lipid therapy, repeat  a lipid panel in one year.     Stephenie Acres, PharmD PGY1 Pharmacy Resident 08/13/2023 12:17 PM

## 2023-08-13 NOTE — Plan of Care (Signed)

## 2023-08-14 DIAGNOSIS — M869 Osteomyelitis, unspecified: Secondary | ICD-10-CM | POA: Diagnosis not present

## 2023-08-14 DIAGNOSIS — N189 Chronic kidney disease, unspecified: Secondary | ICD-10-CM | POA: Diagnosis not present

## 2023-08-14 DIAGNOSIS — M86171 Other acute osteomyelitis, right ankle and foot: Secondary | ICD-10-CM | POA: Diagnosis not present

## 2023-08-14 DIAGNOSIS — I739 Peripheral vascular disease, unspecified: Secondary | ICD-10-CM | POA: Diagnosis not present

## 2023-08-14 LAB — BASIC METABOLIC PANEL WITH GFR
Anion gap: 7 (ref 5–15)
BUN: 25 mg/dL — ABNORMAL HIGH (ref 8–23)
CO2: 22 mmol/L (ref 22–32)
Calcium: 8.7 mg/dL — ABNORMAL LOW (ref 8.9–10.3)
Chloride: 109 mmol/L (ref 98–111)
Creatinine, Ser: 2.11 mg/dL — ABNORMAL HIGH (ref 0.61–1.24)
GFR, Estimated: 31 mL/min — ABNORMAL LOW (ref >60)
Glucose, Bld: 77 mg/dL (ref 70–99)
Potassium: 3.9 mmol/L (ref 3.5–5.1)
Sodium: 138 mmol/L (ref 135–145)

## 2023-08-14 LAB — GLUCOSE, CAPILLARY
Glucose-Capillary: 75 mg/dL (ref 70–99)
Glucose-Capillary: 186 mg/dL — ABNORMAL HIGH (ref 70–99)
Glucose-Capillary: 186 mg/dL — ABNORMAL HIGH (ref 70–99)
Glucose-Capillary: 190 mg/dL — ABNORMAL HIGH (ref 70–99)

## 2023-08-14 NOTE — Progress Notes (Signed)
 [facility] PROGRESS NOTE    Progress Note  Eric Choi  NWG:[id number] DOB: [birth year] DOA: 08/09/2023 PCP: Shirline Frees, NP     Brief Narrative:   Eric Choi is an 83 y.o. male past medical history of essential hypertension, hyperlipidemia CAD uncontrolled diabetes mellitus type 2 insulin-dependent sent from the podiatrist office due to right second toe infection and osteomyelitis, care of the foot confirmed lytic lesion of the distal tuft of the second phalange. 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 Started empirically IV doxy and Unasyn. ABIs showed moderate atherosclerotic disease. MRI of the foot showed acute osteomyelitis Vascular surgery was consulted angiogram and intervention on 08/11/2023 and 08/12/2023, with angioplasty and stenting . Vascular recommended to continue aspirin Plavix and statins.  . 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  Chronic kidney disease stage IIIb: Baseline creatinine around 2, continue bicarbonate tabs. Creatinine has remained stable. Continue to monitor.  CAD/PAD/hyperlipidemia: Continue aspirin and fenofibrate.  Essential hypertension: Hold ACE inhibitor start Norvasc at a lower dose. Continue monitoring blood pressure.   DVT prophylaxis: Lovenox Family Communication:none Status is: Inpatient Remains inpatient appropriate because: Right foot osteomyelitis    Code Status:     Code Status Orders  (From admission, onward)           Start     Ordered   08/09/23 1822  Full code  Continuous       Question:  By:  Answer:  Consent: discussion documented in EHR   08/09/23 1823           Code Status History     Date Active Date  Inactive Code Status Order ID Comments User Context   12/31/2020 1742 01/01/2021 1848 Full Code [id number]  Milinda Antis, PA-C Inpatient         IV Access:   Peripheral IV   Procedures and diagnostic studies:   PERIPHERAL VASCULAR CATHETERIZATION Result Date: 08/12/2023 Table formatting from the original result was not included. DATE OF SERVICE: 08/12/2023  PATIENT:  Eric Choi  83 y.o. male  PRE-OPERATIVE DIAGNOSIS:  Atherosclerosis of native arteries of right lower extremity causing ulceration  POST-OPERATIVE DIAGNOSIS:  Same  PROCEDURE:  1) Ultrasound guided left common femoral artery access 2) Right lower extremity angiogram with third order cannulation 3) Right femoropopliteal intravascular lithotripsy (5x66mm Shockwave) 3) Right femoropopliteal angioplasty and stenting (7x149mm Eluvia) 4) Conscious sedation (83 minutes)  SURGEON:  Rande Brunt. Lenell Antu, MD  ASSISTANT: none  ANESTHESIA:   local and IV sedation  ESTIMATED BLOOD LOSS: minimal  LOCAL MEDICATIONS USED:  LIDOCAINE  COUNTS: confirmed correct.  PATIENT DISPOSITION:  PACU - hemodynamically stable.  Delay start of Pharmacological VTE agent (>24hrs) due to surgical blood loss or risk of bleeding: no  INDICATION FOR PROCEDURE: Eric Choi is a 83 y.o. 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 After careful discussion of risks, benefits, and alternatives the patient was offered angiography. The patient understood and wished to proceed.  OPERATIVE FINDINGS:  Left renal artery: not studied Right renal artery: not studied Infrarenal aorta: not studied Left common iliac artery: not studied Right common iliac artery: not studied Left internal iliac artery: not studied Right internal iliac artery: not studied Left external iliac artery: not  studied Right external iliac artery: not studied Left common femoral artery: not studied Right common femoral artery: patent Left  profunda femoris artery: not studied Right profunda femoris artery: patent Left superficial femoral artery: not studied Right superficial femoral artery: patent proximally; occluded distally at hunter's canal. Heavily calcified plaque distally Left popliteal artery: not studied Right popliteal artery: reconstitutes above the knee; patent behind and below the knee Left anterior tibial artery: not studied Right anterior tibial artery: patent to the foot Left tibioperoneal trunk: not studied Right tibioperoneal trunk: patent Left peroneal artery: not studied Right peroneal artery: patent to the ankle Left posterior tibial artery: not studied Right posterior tibial artery: patent proximally, occludes distally Left pedal circulation: not studied Right pedal circulation: fills via AT  DESCRIPTION OF PROCEDURE: After identification of the patient in the pre-operative holding area, the patient was transferred to the operating room. The patient was positioned supine on the operating room table. Anesthesia was induced. The groins was prepped and draped in standard fashion. A surgical pause was performed confirming correct patient, procedure, and operative location.  The left groin was anesthetized with subcutaneous injection of 1% lidocaine. Using ultrasound guidance, the left common femoral artery was accessed with micropuncture technique. Fluoroscopy was used to confirm cannulation over the femoral head. The 71F micropuncture sheath was upsized to 31F.  The right common iliac artery was selected with an omniflush catheter and glidewire advantage guidewire. The wire was advanced into the common femoral artery. Over the wire the omni flush catheter was advanced into the external iliac artery. Selective angiography was performed - see above for details.  The decision was made to intervene. The patient was heparinized with 10,000 units of heparin. The 31F sheath was exchanged for a 71F x 45cm sheath. Selective angiography of the  left lower extremity was performed prior to intervention.  The case was highly challenging.  His iliac anatomy was very tortuous.  His obese groins made delivering a sheath into the external iliac artery challenging.  Ultimately, I was able to cross the occluded femoral-popliteal segment with a Glidewire and quick cross catheter.  During my initial intervention, I elected to use a 7 x 150 mm Eluvia to primarily stent the lesion as it was heavily calcified.  The stent fractured during deployment and had to be carefully retrieved.  About 20 mm of the stent remained and deployed, I was able to retrieve the rest.  I then performed balloon assisted intravascular lithotripsy of the lesion.  After this and angioplasty with a 5 x 150 mm Mustang balloon, I was able to cross the lesion without difficulty with an additional 7 x 150 mm Eluvia.  This was postdilated with a 6 x 150 mm Mustang balloon.  Completion angiogram showed recanalization of the femoral-popliteal occlusion.  During the course of the case a small hematoma acuminate in the groin.  Elected to remove all investor equipment and hold pressure in the room to obtain hemostasis.  Heparin was reversed with protamine.  Conscious sedation was administered with the use of IV fentanyl and midazolam under continuous physician and nurse monitoring.  Heart rate, blood pressure, and oxygen saturation were continuously monitored.  Total sedation time was 83 minutes  Upon completion of the case instrument and sharps counts were confirmed correct. The patient was transferred to the PACU in good condition. I was present for all portions of the procedure.  PLAN: Aspirin, Plavix, statin therapies.  Follow-up with me or Dr. Chestine Spore in 4 weeks with ankle-brachial  index and arterial duplex.  Fractured stent is covered by normal, intact stent; no specific attention needed to this in follow-up.  Rande Brunt. Lenell Antu, MD [facility] Phone Number:  [phone] 08/12/2023 5:01 PM     Medical Consultants:   None.   Subjective:    Eric Choi pain is controlled had a bowel movement.  Objective:    Vitals:   08/13/23 0740 08/13/23 1555 08/13/23 2018 08/14/23 0340  BP: (!) 140/69 (!) 137/58 (!) 152/62 (!) 156/68  Pulse: 89 100 97 91  Resp: 14 20 16 16   Temp: 98.5 F (36.9 C) 99.4 F (37.4 C) 99 F (37.2 C) 98.2 F (36.8 C)  TempSrc: Oral Oral Oral Oral  SpO2: 100% 96% 98% 98%  Weight:      Height:       SpO2: 98 % O2 Flow Rate (L/min): 2 L/min   Intake/Output Summary (Last 24 hours) at 08/14/2023 1041 Last data filed at 08/14/2023 0000 Gross per 24 hour  Intake 710 ml  Output 350 ml  Net 360 ml   Filed Weights   08/09/23 1128 08/09/23 1928 08/11/23 1825  Weight: 106.8 kg 107.7 kg 103.7 kg    Exam: General exam: In no acute distress. Respiratory system: Good air movement and clear to auscultation. Cardiovascular system: S1 & S2 heard, RRR. No JVD. Gastrointestinal system: Abdomen is nondistended, soft and nontender.  Extremities: No pedal edema. Skin: No rashes, lesions or ulcers Psychiatry: Judgement and insight appear normal. Mood & affect appropriate.  Data Reviewed:    Labs: Basic Metabolic Panel: Recent Labs  Lab 08/09/23 1130 08/09/23 2003 08/11/23 0736 08/11/23 1455 08/12/23 1004 08/13/23 0313 08/14/23 0443  NA 138  --  139 139 141 140 138  K 4.8  --  4.4 4.3 4.0 4.3 3.9  CL 106  --  108 106 108 110 109  CO2 23  --  21*  --  21* 22 22  GLUCOSE 155*  --  79 108* 95 116* 77  BUN 28*  --  27* 24* 24* 25* 25*  CREATININE 2.34*   < > 2.13* 2.00* 2.10* 2.14* 2.11*  CALCIUM 9.5  --  9.4  --  9.1 8.7* 8.7*   < > = values in this interval not displayed.   GFR Estimated Creatinine Clearance: 32 mL/min (A) (by C-G formula based on SCr of 2.11 mg/dL (H)). Liver Function Tests: Recent Labs  Lab 08/09/23 1130  AST 17  ALT 16  ALKPHOS 51  BILITOT 0.5  PROT 7.0  ALBUMIN 3.3*   No  results for input(s): "LIPASE", "AMYLASE" in the last 168 hours. No results for input(s): "AMMONIA" in the last 168 hours. Coagulation profile No results for input(s): "INR", "PROTIME" in the last 168 hours. COVID-19 Labs  No results for input(s): "DDIMER", "FERRITIN", "LDH", "CRP" in the last 72 hours.  Lab Results  Component Value Date   SARSCOV2NAA RESULT: NEGATIVE 12/29/2020    CBC: Recent Labs  Lab 08/09/23 1130 08/09/23 2003 08/11/23 1455  WBC 13.8* 12.2*  --   NEUTROABS 9.5*  --   --   HGB 13.4 13.1 13.9  HCT 42.1 39.8 41.0  MCV 91.1 88.1  --   PLT 309 279  --    Cardiac Enzymes: No results for input(s): "CKTOTAL", "CKMB", "CKMBINDEX", "TROPONINI" in the last 168 hours. BNP (last 3 results) No results for input(s): "PROBNP" in the last 8760 hours. CBG: Recent Labs  Lab 08/13/23 0824 08/13/23 1242 08/13/23 1724 08/13/23 2102 08/14/23 0748  GLUCAP 76 166* 96 162* 75   D-Dimer: No results for input(s): "DDIMER" in the last 72 hours. Hgb A1c: No results for input(s): "HGBA1C" in the last 72 hours. Lipid Profile: Recent Labs    08/12/23 0417 08/13/23 0313  CHOL 75 72  HDL 29* 28*  LDLCALC 24 19  TRIG 110 124  CHOLHDL 2.6 2.6   Thyroid function studies: No results for input(s): "TSH", "T4TOTAL", "T3FREE", "THYROIDAB" in the last 72 hours.  Invalid input(s): "FREET3" Anemia work up: No results for input(s): "VITAMINB12", "FOLATE", "FERRITIN", "TIBC", "IRON", "RETICCTPCT" in the last 72 hours. Sepsis Labs: Recent Labs  Lab 08/09/23 1130 08/09/23 1145 08/09/23 2003  WBC 13.8*  --  12.2*  LATICACIDVEN  --  0.8  --    Microbiology Recent Results (from the past 240 hours)  MRSA Next Gen by PCR, Nasal     Status: None   Collection Time: 08/09/23  7:44 PM   Specimen: Nasal Mucosa; Nasal Swab  Result Value Ref Range Status   MRSA by PCR Next Gen NOT DETECTED NOT DETECTED Final    Comment: (NOTE) The GeneXpert MRSA Assay (FDA approved for NASAL  specimens only), is one component of a comprehensive MRSA colonization surveillance program. It is not intended to diagnose MRSA infection nor to guide or monitor treatment for MRSA infections. Test performance is not FDA approved in patients less than 6 years old. Performed at [facility], [address], [place], [place] [address]      Medications:    amLODipine  5 mg Oral Daily   aspirin EC  81 mg Oral Daily   atorvastatin  40 mg Oral Daily   cholecalciferol  1,000 Units Oral Daily   clopidogrel  75 mg Oral Q breakfast   enoxaparin (LOVENOX) injection  40 mg Subcutaneous Q24H   ezetimibe  10 mg Oral Daily   gemfibrozil  300 mg Oral Q1400   insulin aspart  0-5 Units Subcutaneous QHS   insulin aspart  0-9 Units Subcutaneous TID WC   insulin aspart  2 Units Subcutaneous TID WC   insulin glargine-yfgn  20 Units Subcutaneous BID   metoprolol tartrate  50 mg Oral Daily   sodium bicarbonate  650 mg Oral BID   sodium chloride flush  3 mL Intravenous Q12H   sodium chloride flush  3 mL Intravenous Q12H   Continuous Infusions:  ampicillin-sulbactam (UNASYN) IV 1.5 g (08/14/23 0911)   doxycycline (VIBRAMYCIN) IV Stopped (08/14/23 0001)      LOS: 5 days   Marinda Elk  [facility]  08/14/2023, 10:41 AM

## 2023-08-14 NOTE — Anesthesia Preprocedure Evaluation (Signed)
 Anesthesia Evaluation  Patient identified by MRN, date of birth, ID band Patient awake    Reviewed: Allergy & Precautions, NPO status , Patient's Chart, lab work & pertinent test results  Airway Mallampati: III  TM Distance: >3 FB Neck ROM: Full    Dental  (+) Edentulous Upper, Missing, Chipped, Dental Advisory Given, Poor Dentition,    Pulmonary shortness of breath and with exertion, former smoker Quit smoking 1993   breath sounds clear to auscultation       Cardiovascular hypertension, Pt. on medications and Pt. 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 Left ventricular ejection fraction, by estimation, is 50%. The left  ventricle has mildly decreased function. The left ventricle demonstrates  regional wall motion abnormalities with mid-apical inferoseptal and  anteroseptal mild hypokinesis. There is  mild left ventricular hypertrophy. Left ventricular diastolic parameters  are consistent with Grade I diastolic dysfunction (impaired relaxation).  2. Right ventricular systolic function is normal. The right ventricular  size is normal. Tricuspid regurgitation signal is inadequate for assessing  PA pressure.  3. The mitral valve is normal in structure. No evidence of mitral valve  regurgitation. No evidence of mitral stenosis.  4. The aortic valve is tricuspid. Aortic valve regurgitation is not  visualized. Mild aortic valve sclerosis is present, with no evidence of  aortic valve stenosis.  5. IVC not visualized.   Stress test 2021:  The left ventricular ejection fraction is normal (55-65%).  Nuclear stress EF: 65%.  There was no ST segment deviation noted during stress.  The study is normal.  This is a low risk study.    Neuro/Psych Carotid US 11/2020: Right  Carotid: Velocities in the right ICA are consistent with a 80-99% stenosis. Non-hemodynamically significant plaque <50% noted in the CCA. The ECA appears >50% stenosed.  Left Carotid: Velocities in the left ICA are consistent with a 40-59% stenosis.   negative psych ROS   GI/Hepatic negative GI ROS, Neg liver ROS,,,  Endo/Other  diabetes, Poorly Controlled, Type 2, Insulin Dependent  Obesity BMI 36 a1c 8.8 FS 118 in preop this AM, no insulin today   Renal/GU Renal InsufficiencyRenal diseaseCr 2.13     Musculoskeletal negative musculoskeletal ROS (+)    Abdominal  (+) + obese  Peds  Hematology negative hematology ROS (+)   Anesthesia Other Findings   Reproductive/Obstetrics                             Anesthesia Physical Anesthesia Plan  ASA: 3  Anesthesia Plan: MAC   Post-op Pain Management: Tylenol PO (pre-op)*   Induction: Intravenous  PONV Risk Score and Plan: 2 and Ondansetron, Dexamethasone, Treatment may vary due to age or medical condition, Propofol infusion and TIVA  Airway Management Planned: Natural Airway  Additional Equipment:   Intra-op Plan:   Post-operative Plan:   Informed Consent: I have reviewed the patients History and Physical, chart, labs and discussed the procedure including the risks, benefits and alternatives for the proposed anesthesia with the patient or authorized representative who has indicated his/her understanding and acceptance.     Dental advisory given  Plan Discussed with: CRNA  Anesthesia Plan Comments:         Anesthesia Quick Evaluation

## 2023-08-14 NOTE — Plan of Care (Signed)

## 2023-08-15 ENCOUNTER — Other Ambulatory Visit: Payer: Self-pay

## 2023-08-15 ENCOUNTER — Inpatient Hospital Stay (HOSPITAL_COMMUNITY): Payer: Self-pay

## 2023-08-15 ENCOUNTER — Inpatient Hospital Stay (HOSPITAL_COMMUNITY)

## 2023-08-15 ENCOUNTER — Encounter (HOSPITAL_COMMUNITY): Payer: Self-pay | Admitting: Internal Medicine

## 2023-08-15 ENCOUNTER — Encounter (HOSPITAL_COMMUNITY)
Admission: EM | Disposition: A | Discharge status: Home / Self Care | Payer: Self-pay | Source: Ambulatory Visit | Attending: Internal Medicine

## 2023-08-15 DIAGNOSIS — Z8639 Personal history of other endocrine, nutritional and metabolic disease: Secondary | ICD-10-CM

## 2023-08-15 DIAGNOSIS — N189 Chronic kidney disease, unspecified: Secondary | ICD-10-CM | POA: Diagnosis not present

## 2023-08-15 DIAGNOSIS — I2510 Atherosclerotic heart disease of native coronary artery without angina pectoris: Secondary | ICD-10-CM

## 2023-08-15 DIAGNOSIS — M869 Osteomyelitis, unspecified: Secondary | ICD-10-CM

## 2023-08-15 DIAGNOSIS — Z87891 Personal history of nicotine dependence: Secondary | ICD-10-CM

## 2023-08-15 DIAGNOSIS — E1169 Type 2 diabetes mellitus with other specified complication: Secondary | ICD-10-CM

## 2023-08-15 DIAGNOSIS — M86171 Other acute osteomyelitis, right ankle and foot: Secondary | ICD-10-CM | POA: Diagnosis not present

## 2023-08-15 HISTORY — PX: AMPUTATION TOE: SHX6595

## 2023-08-15 LAB — BASIC METABOLIC PANEL WITH GFR
Anion gap: 10 (ref 5–15)
Anion gap: 12 (ref 5–15)
BUN: 25 mg/dL — ABNORMAL HIGH (ref 8–23)
BUN: 25 mg/dL — ABNORMAL HIGH (ref 8–23)
CO2: 22 mmol/L (ref 22–32)
CO2: 20 mmol/L — ABNORMAL LOW (ref 22–32)
Calcium: 8.9 mg/dL (ref 8.9–10.3)
Calcium: 9 mg/dL (ref 8.9–10.3)
Chloride: 105 mmol/L (ref 98–111)
Chloride: 105 mmol/L (ref 98–111)
Creatinine, Ser: 2.05 mg/dL — ABNORMAL HIGH (ref 0.61–1.24)
Creatinine, Ser: 2.16 mg/dL — ABNORMAL HIGH (ref 0.61–1.24)
GFR, Estimated: 32 mL/min — ABNORMAL LOW (ref >60)
GFR, Estimated: 30 mL/min — ABNORMAL LOW (ref >60)
Glucose, Bld: 100 mg/dL — ABNORMAL HIGH (ref 70–99)
Glucose, Bld: 172 mg/dL — ABNORMAL HIGH (ref 70–99)
Potassium: 3.8 mmol/L (ref 3.5–5.1)
Potassium: 4.1 mmol/L (ref 3.5–5.1)
Sodium: 137 mmol/L (ref 135–145)
Sodium: 137 mmol/L (ref 135–145)

## 2023-08-15 LAB — GLUCOSE, CAPILLARY
Glucose-Capillary: 114 mg/dL — ABNORMAL HIGH (ref 70–99)
Glucose-Capillary: 105 mg/dL — ABNORMAL HIGH (ref 70–99)
Glucose-Capillary: 119 mg/dL — ABNORMAL HIGH (ref 70–99)
Glucose-Capillary: 173 mg/dL — ABNORMAL HIGH (ref 70–99)
Glucose-Capillary: 201 mg/dL — ABNORMAL HIGH (ref 70–99)
Glucose-Capillary: 223 mg/dL — ABNORMAL HIGH (ref 70–99)

## 2023-08-15 SURGERY — AMPUTATION, TOE
Anesthesia: Monitor Anesthesia Care | Site: Toe | Laterality: Right

## 2023-08-15 MED ORDER — LACTATED RINGERS IV SOLN: INTRAVENOUS | Status: DC | PRN

## 2023-08-15 MED ORDER — CHLORHEXIDINE GLUCONATE 0.12 % MT SOLN: 15.0000 mL | Freq: Once | OROMUCOSAL | Status: AC

## 2023-08-15 MED ORDER — LIDOCAINE 2% (20 MG/ML) 5 ML SYRINGE
INTRAMUSCULAR | Status: AC
Start: 2023-08-15 — End: ?
  Filled 2023-08-15: qty 5

## 2023-08-15 MED ORDER — BUPIVACAINE HCL (PF) 0.5 % IJ SOLN
INTRAMUSCULAR | Status: AC
  Filled 2023-08-15: qty 10

## 2023-08-15 MED ORDER — FENTANYL CITRATE (PF) 250 MCG/5ML IJ SOLN
INTRAMUSCULAR | Status: DC | PRN
  Administered 2023-08-15: 50 ug via INTRAVENOUS

## 2023-08-15 MED ORDER — PHENYLEPHRINE 80 MCG/ML (10ML) SYRINGE FOR IV PUSH (FOR BLOOD PRESSURE SUPPORT)
PREFILLED_SYRINGE | INTRAVENOUS | Status: DC | PRN
  Administered 2023-08-15: 80 ug via INTRAVENOUS

## 2023-08-15 MED ORDER — ONDANSETRON HCL 4 MG/2ML IJ SOLN
INTRAMUSCULAR | Status: AC
  Filled 2023-08-15: qty 2

## 2023-08-15 MED ORDER — PHENYLEPHRINE 80 MCG/ML (10ML) SYRINGE FOR IV PUSH (FOR BLOOD PRESSURE SUPPORT)
PREFILLED_SYRINGE | INTRAVENOUS | Status: AC
  Filled 2023-08-15: qty 10

## 2023-08-15 MED ORDER — LIDOCAINE 2% (20 MG/ML) 5 ML SYRINGE
INTRAMUSCULAR | Status: DC | PRN
  Administered 2023-08-15: 40 mg via INTRAVENOUS

## 2023-08-15 MED ORDER — CHLORHEXIDINE GLUCONATE 0.12 % MT SOLN
OROMUCOSAL | Status: AC
  Administered 2023-08-15: 15 mL via OROMUCOSAL
  Filled 2023-08-15: qty 15

## 2023-08-15 MED ORDER — EPHEDRINE 5 MG/ML INJ
INTRAVENOUS | Status: AC
  Filled 2023-08-15: qty 5

## 2023-08-15 MED ORDER — SODIUM CHLORIDE 0.9 % IR SOLN
Status: DC | PRN
  Administered 2023-08-15: 1000 mL

## 2023-08-15 MED ORDER — PROPOFOL 10 MG/ML IV BOLUS
INTRAVENOUS | Status: DC | PRN
  Administered 2023-08-15: 30 mg via INTRAVENOUS

## 2023-08-15 MED ORDER — DEXMEDETOMIDINE HCL IN NACL 80 MCG/20ML IV SOLN
INTRAVENOUS | Status: AC
  Filled 2023-08-15: qty 20

## 2023-08-15 MED ORDER — LIDOCAINE HCL 1 % IJ SOLN
INTRAMUSCULAR | Status: DC | PRN
  Administered 2023-08-15: 10 mL via INTRAMUSCULAR

## 2023-08-15 MED ORDER — ONDANSETRON HCL 4 MG/2ML IJ SOLN
INTRAMUSCULAR | Status: DC | PRN
  Administered 2023-08-15: 4 mg via INTRAVENOUS

## 2023-08-15 MED ORDER — LIDOCAINE HCL (PF) 1 % IJ SOLN
INTRAMUSCULAR | Status: AC
Start: 2023-08-15 — End: ?
  Filled 2023-08-15: qty 10

## 2023-08-15 MED ORDER — FENTANYL CITRATE (PF) 250 MCG/5ML IJ SOLN
INTRAMUSCULAR | Status: AC
Start: 2023-08-15 — End: ?
  Filled 2023-08-15: qty 5

## 2023-08-15 MED ORDER — PROPOFOL 1000 MG/100ML IV EMUL
INTRAVENOUS | Status: AC
  Filled 2023-08-15: qty 100

## 2023-08-15 MED ORDER — PROPOFOL 500 MG/50ML IV EMUL
INTRAVENOUS | Status: DC | PRN
  Administered 2023-08-15: 80 ug/kg/min via INTRAVENOUS

## 2023-08-15 MED ORDER — ACETAMINOPHEN 500 MG PO TABS
1000.0000 mg | ORAL_TABLET | Freq: Once | ORAL | Status: AC
  Administered 2023-08-15: 1000 mg via ORAL
  Filled 2023-08-15: qty 2

## 2023-08-15 MED ORDER — ORAL CARE MOUTH RINSE: 15.0000 mL | Freq: Once | OROMUCOSAL | Status: AC

## 2023-08-15 SURGICAL SUPPLY — 40 items
BLADE AVERAGE 25X9 (BLADE) IMPLANT
BLADE SURG 10 STRL SS (BLADE) ×1 IMPLANT
BLADE SURG 15 STRL LF DISP TIS (BLADE) ×1 IMPLANT
BNDG COHESIVE 3X5 TAN ST LF (GAUZE/BANDAGES/DRESSINGS) ×1 IMPLANT
BNDG ELASTIC 3INX 5YD STR LF (GAUZE/BANDAGES/DRESSINGS) ×1 IMPLANT
BNDG ELASTIC 4INX 5YD STR LF (GAUZE/BANDAGES/DRESSINGS) IMPLANT
BNDG ESMARK 4X9 LF (GAUZE/BANDAGES/DRESSINGS) ×1 IMPLANT
BNDG GAUZE DERMACEA FLUFF 4 (GAUZE/BANDAGES/DRESSINGS) IMPLANT
CHLORAPREP W/TINT 26 (MISCELLANEOUS) IMPLANT
DRSG ADAPTIC 3X8 NADH LF (GAUZE/BANDAGES/DRESSINGS) IMPLANT
DRSG EMULSION OIL 3X3 NADH (GAUZE/BANDAGES/DRESSINGS) IMPLANT
DRSG XEROFORM 1X8 (GAUZE/BANDAGES/DRESSINGS) IMPLANT
ELECT REM PT RETURN 9FT ADLT (ELECTROSURGICAL) ×1 IMPLANT
ELECTRODE REM PT RTRN 9FT ADLT (ELECTROSURGICAL) ×1 IMPLANT
GAUZE PAD ABD 8X10 STRL (GAUZE/BANDAGES/DRESSINGS) IMPLANT
GAUZE SPONGE 2X2 STRL 8-PLY (GAUZE/BANDAGES/DRESSINGS) IMPLANT
GAUZE SPONGE 4X4 12PLY STRL (GAUZE/BANDAGES/DRESSINGS) ×1 IMPLANT
GAUZE STRETCH 2X75IN STRL (MISCELLANEOUS) ×1 IMPLANT
GAUZE XEROFORM 1X8 LF (GAUZE/BANDAGES/DRESSINGS) ×1 IMPLANT
GLOVE BIO SURGEON STRL SZ7.5 (GLOVE) ×1 IMPLANT
GLOVE BIOGEL PI IND STRL 7.5 (GLOVE) ×1 IMPLANT
GOWN STRL REUS W/ TWL LRG LVL3 (GOWN DISPOSABLE) ×2 IMPLANT
KIT BASIN OR (CUSTOM PROCEDURE TRAY) ×1 IMPLANT
NDL BIOPSY JAMSHIDI 8X6 (NEEDLE) IMPLANT
NDL HYPO 25X1 1.5 SAFETY (NEEDLE) ×1 IMPLANT
NEEDLE BIOPSY JAMSHIDI 8X6 (NEEDLE) IMPLANT
NEEDLE HYPO 25X1 1.5 SAFETY (NEEDLE) ×1 IMPLANT
PACK ORTHO EXTREMITY (CUSTOM PROCEDURE TRAY) ×1 IMPLANT
PADDING CAST ABS COTTON 4X4 ST (CAST SUPPLIES) ×2 IMPLANT
SET HNDPC FAN SPRY TIP SCT (DISPOSABLE) IMPLANT
SPIKE FLUID TRANSFER (MISCELLANEOUS) IMPLANT
STOCKINETTE 4X48 STRL (DRAPES) IMPLANT
SUT ETHILON 3 0 FSLX (SUTURE) IMPLANT
SUT PROLENE 3 0 PS 2 (SUTURE) IMPLANT
SUT PROLENE 4 0 PS 2 18 (SUTURE) IMPLANT
SYR CONTROL 10ML LL (SYRINGE) ×1 IMPLANT
TUBE CONNECTING 12X1/4 (SUCTIONS) IMPLANT
UNDERPAD 30X36 HEAVY ABSORB (UNDERPADS AND DIAPERS) ×1 IMPLANT
WATER STERILE IRR 1000ML POUR (IV SOLUTION) ×1 IMPLANT
YANKAUER SUCT BULB TIP NO VENT (SUCTIONS) IMPLANT

## 2023-08-15 NOTE — Op Note (Signed)
 Full Operative Report  Date of Operation: 8:01 AM, 08/15/2023   Patient: Eric Choi - 83 y.o. 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 Amputation of second toe at mid proximal phalanx, right foot    Anesthesia: Monitor Anesthesia Care  Lewie Loron, MD  Anesthesiologist: Lewie Loron, MD CRNA: Adriana Reams A, CRNA   Estimated Blood Loss: Minimal   Hemostasis: 1) Anatomical dissection, mechanical compression, electrocautery 2) No tourniquet was used  Implants: * No implants in log *  Materials: prolene 3-0   Injectables: 1) Pre-operatively: 10 cc of 50:50 mixture 1%lidocaine plain and 0.5% marcaine plain 2) Post-operatively: None   Specimens: Pathology: Right 2nd toe  Microbiology: bone culture distal phalanx 2nd toe   Antibiotics: IV antibiotics given per schedule on the floor  Drains: None  Complications: Patient tolerated the procedure well without complication.   Operative findings: As below in detailed report  Indications for Procedure: Eric Choi presents to Pilar Plate, DPM with a chief complaint of ulcer with underlying osteomyelitis of the right 2nd toe. The patient has failed conservative treatments of various modalities. At this time the patient has elected to proceed with surgical correction. All alternatives, risks, and complications of the procedures were thoroughly explained to the patient. Patient exhibits appropriate understanding of all discussion points and informed consent was signed and obtained in the chart with no guarantees to surgical outcome given or implied.  Description of Procedure: Patient was brought to the operating room. Patient remained on their hospital bed in the supine position. A surgical timeout was performed and all members of the operating room, the procedure, and the surgical site were identified. anesthesia occurred as per  anesthesia record. Local anesthetic as previously described was then injected about the operative field in a local infiltrative block.  The operative lower extremity as noted above was then prepped and draped in the usual sterile manner. The following procedure then began.  Attention was directed to the 2nd digit on the right foot. A full-thickness incision encompassing the entire digit was made using a #15 blade. Dissection was carried down to bone. The toe was secured with a towel clamp, further dissected in its entirety, and disarticulated at the PIPJ and passed to the back table as a gross specimen. This was then labled and sent to pathology. The bone was noted to be soft and eroded, and consistent with osteomyelitis. A bone culture was harvested with rongeur from distal phalanx and sent for culture. All remaining necrotic and devitalized soft tissue structures were visualized and dissected away using sharp and dull dissection. A sagittal saw was then used to make a transverse ostetomy through the proximal phalanx and the head of proxmial phalanx was sent with path. Care was taken to protect all neurovascular structures throughout the dissection.  Good bleeding at amp site noted. The area was then flushed with copious amounts of sterile saline. Then using the suture materials previously described, the site was closed in anatomic layers and the skin was well approximated under minimal tension.   The surgical site was then dressed with xerform 4x4 kerlix and ace wrap. The patient tolerated both the procedure and anesthesia well with vital signs stable throughout. The patient was transferred in good condition and all vital signs stable  from the OR to recovery under the discretion of anesthesia.  Condition: Vital signs stable, neurovascular status unchanged from preoperative  Surgical plan:  Expect clean margin, WBAT in post op shoe. Rec 7 days doxy and augmentin on DC.  The patient will be WBAT in a  post op  shoe to the operative limb until further instructed. The dressing is to remain clean, dry, and intact. Will continue to follow unless noted elsewhere.   Carlena Hurl, DPM [facility]

## 2023-08-15 NOTE — Anesthesia Postprocedure Evaluation (Signed)
 Anesthesia Post Note  Patient: Eric Choi  Procedure(s) Performed: AMPUTATION, TOE (Right: Toe)     Patient location during evaluation: PACU Anesthesia Type: MAC Level of consciousness: awake and alert Pain management: pain level controlled Vital Signs Assessment: post-procedure vital signs reviewed and stable Respiratory status: spontaneous breathing Cardiovascular status: stable Anesthetic complications: no   No notable events documented.  Last Vitals:  Vitals:   08/15/23 0810 08/15/23 0820  BP: 123/62 130/66  Pulse: 80 81  Resp: (!) 9 17  Temp:  36.6 C  SpO2: 95% 97%    Last Pain:  Vitals:   08/15/23 0820  TempSrc:   PainSc: 0-No pain                 Lewie Loron

## 2023-08-15 NOTE — Progress Notes (Signed)
 History and Physical Interval Note:  08/15/2023 7:21 AM  Eric Choi  has presented today for surgery, with the diagnosis of osteomyelitis of right second toe.  The various methods of treatment have been discussed with the patient and family. After consideration of risks, benefits and other options for treatment, the patient has consented to   Procedure(s) with comments: AMPUTATION, TOE (Right) - R 2nd toe amp as a surgical intervention.  The patient's history has been reviewed, patient examined, no change in status, stable for surgery.  I have reviewed the patient's chart and labs.  Questions were answered to the patient's satisfaction.     Jenelle Mages Yee Gangi

## 2023-08-15 NOTE — Progress Notes (Signed)
 Orthopedic Tech Progress Note Patient Details:  Eric Choi [birth year] [id number] Applied post op shoe per order.  Ortho Devices Type of Ortho Device: Postop shoe/boot Ortho Device/Splint Location: RLE Ortho Device/Splint Interventions: Ordered, Application, Adjustment   Post Interventions Patient Tolerated: Well Instructions Provided: Adjustment of device, Care of device  Blase Mess 08/15/2023, 9:06 AM

## 2023-08-15 NOTE — Plan of Care (Signed)
   Problem: Activity: Goal: Risk for activity intolerance will decrease Outcome: Progressing

## 2023-08-15 NOTE — Progress Notes (Signed)
 [facility] PROGRESS NOTE    Progress Note  Eric Choi  NGE:[id number] DOB: 04/19/1941 DOA: 08/09/2023 PCP: Eric Frees, NP     Brief Narrative:   Eric Choi is an 83 y.o. male past medical history of essential hypertension, hyperlipidemia CAD uncontrolled diabetes mellitus type 2 insulin-dependent sent from the podiatrist office due to right second toe infection and osteomyelitis, care of the foot confirmed lytic lesion of the distal tuft of the second phalange. 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 Started empirically IV doxy and Unasyn. MRI of the foot showed acute osteomyelitis Vascular surgery was consulted angiogram and intervention on 08/11/2023 and 08/12/2023, with angioplasty and stenting . Vascular recommended to continue aspirin Plavix and statins.  . Status post amputation of the second toe in the proximal phalanges of the right foot by podiatrist. Duration and type of antibiotic per podiatrist.  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  Chronic kidney disease stage IIIb: Baseline creatinine around 2, continue bicarbonate tabs. Creatinine has remained stable. Continue to monitor.  CAD/PAD/hyperlipidemia: Continue aspirin and fenofibrate.  Essential hypertension: Hold ACE inhibitor start Norvasc at a lower dose. Continue monitoring blood pressure.   DVT prophylaxis: Lovenox Family Communication:none Status is: Inpatient Remains inpatient appropriate because: Right foot osteomyelitis    Code Status:     Code Status Orders  (From admission, onward)           Start     Ordered   08/09/23 1822  Full code  Continuous       Question:  By:  Answer:  Consent: discussion documented in EHR   08/09/23 1823            Code Status History     Date Active Date Inactive Code Status Order ID Comments User Context   12/31/2020 1742 01/01/2021 1848 Full Code [id number]  Eric Antis, PA-C Inpatient         IV Access:   Peripheral IV   Procedures and diagnostic studies:   No results found.    Medical Consultants:   None.   Subjective:    Eric Choi no complaints today.  Objective:    Vitals:   08/15/23 0652 08/15/23 0810 08/15/23 0820 08/15/23 0848  BP: (!) 155/65 123/62 130/66 (!) 142/64  Pulse: 92 80 81 83  Resp: 20 (!) 9 17 14   Temp: 98.9 F (37.2 C)  97.9 F (36.6 C) 97.9 F (36.6 C)  TempSrc: Oral   Oral  SpO2: 94% 95% 97%   Weight: 106.6 kg     Height: 5\' 8"  (1.727 m)      SpO2: 97 % O2 Flow Rate (L/min): 2 L/min   Intake/Output Summary (Last 24 hours) at 08/15/2023 0947 Last data filed at 08/15/2023 0806 Gross per 24 hour  Intake 500 ml  Output 3 ml  Net 497 ml   Filed Weights   08/09/23 1928 08/11/23 1825 08/15/23 0652  Weight: 107.7 kg 103.7 kg 106.6 kg    Exam: General exam: In no acute distress. Respiratory system: Good air movement and clear to auscultation. Cardiovascular system: S1 & S2 heard, RRR. No JVD. Gastrointestinal system: Abdomen is nondistended, soft and nontender.  Extremities: No pedal edema. Skin: No rashes, lesions or ulcers Psychiatry: Judgement and insight appear normal. Mood & affect appropriate.  Data Reviewed:  Labs: Basic Metabolic Panel: Recent Labs  Lab 08/11/23 0736 08/11/23 1455 08/12/23 1004 08/13/23 0313 08/14/23 0443 08/15/23 0516  NA 139 139 141 140 138 137  K 4.4 4.3 4.0 4.3 3.9 3.8  CL 108 106 108 110 109 105  CO2 21*  --  21* 22 22 22   GLUCOSE 79 108* 95 116* 77 100*  BUN 27* 24* 24* 25* 25* 25*  CREATININE 2.13* 2.00* 2.10* 2.14* 2.11* 2.05*  CALCIUM 9.4  --  9.1 8.7* 8.7* 8.9   GFR Estimated Creatinine Clearance: 33.5 mL/min (A) (by C-G formula based on SCr of 2.05 mg/dL (H)). Liver  Function Tests: Recent Labs  Lab 08/09/23 1130  AST 17  ALT 16  ALKPHOS 51  BILITOT 0.5  PROT 7.0  ALBUMIN 3.3*   No results for input(s): "LIPASE", "AMYLASE" in the last 168 hours. No results for input(s): "AMMONIA" in the last 168 hours. Coagulation profile No results for input(s): "INR", "PROTIME" in the last 168 hours. COVID-19 Labs  No results for input(s): "DDIMER", "FERRITIN", "LDH", "CRP" in the last 72 hours.  Lab Results  Component Value Date   SARSCOV2NAA RESULT: NEGATIVE 12/29/2020    CBC: Recent Labs  Lab 08/09/23 1130 08/09/23 2003 08/11/23 1455  WBC 13.8* 12.2*  --   NEUTROABS 9.5*  --   --   HGB 13.4 13.1 13.9  HCT 42.1 39.8 41.0  MCV 91.1 88.1  --   PLT 309 279  --    Cardiac Enzymes: No results for input(s): "CKTOTAL", "CKMB", "CKMBINDEX", "TROPONINI" in the last 168 hours. BNP (last 3 results) No results for input(s): "PROBNP" in the last 8760 hours. CBG: Recent Labs  Lab 08/14/23 1607 08/14/23 2115 08/15/23 0611 08/15/23 0805 08/15/23 0858  GLUCAP 186* 190* 114* 105* 119*   D-Dimer: No results for input(s): "DDIMER" in the last 72 hours. Hgb A1c: No results for input(s): "HGBA1C" in the last 72 hours. Lipid Profile: Recent Labs    08/13/23 0313  CHOL 72  HDL 28*  LDLCALC 19  TRIG 086  CHOLHDL 2.6   Thyroid function studies: No results for input(s): "TSH", "T4TOTAL", "T3FREE", "THYROIDAB" in the last 72 hours.  Invalid input(s): "FREET3" Anemia work up: No results for input(s): "VITAMINB12", "FOLATE", "FERRITIN", "TIBC", "IRON", "RETICCTPCT" in the last 72 hours. Sepsis Labs: Recent Labs  Lab 08/09/23 1130 08/09/23 1145 08/09/23 2003  WBC 13.8*  --  12.2*  LATICACIDVEN  --  0.8  --    Microbiology Recent Results (from the past 240 hours)  MRSA Next Gen by PCR, Nasal     Status: None   Collection Time: 08/09/23  7:44 PM   Specimen: Nasal Mucosa; Nasal Swab  Result Value Ref Range Status   MRSA by PCR Next Gen NOT  DETECTED NOT DETECTED Final    Comment: (NOTE) The GeneXpert MRSA Assay (FDA approved for NASAL specimens only), is one component of a comprehensive MRSA colonization surveillance program. It is not intended to diagnose MRSA infection nor to guide or monitor treatment for MRSA infections. Test performance is not FDA approved in patients less than 23 years old. Performed at [facility], [address], [place], [place] [address]      Medications:    amLODipine  5 mg Oral Daily   aspirin EC  81 mg Oral Daily   atorvastatin  40 mg Oral Daily   cholecalciferol  1,000 Units Oral Daily   clopidogrel  75 mg Oral Q breakfast  enoxaparin (LOVENOX) injection  40 mg Subcutaneous Q24H   ezetimibe  10 mg Oral Daily   gemfibrozil  300 mg Oral Q1400   insulin aspart  0-5 Units Subcutaneous QHS   insulin aspart  0-9 Units Subcutaneous TID WC   insulin aspart  2 Units Subcutaneous TID WC   insulin glargine-yfgn  20 Units Subcutaneous BID   metoprolol tartrate  50 mg Oral Daily   sodium bicarbonate  650 mg Oral BID   sodium chloride flush  3 mL Intravenous Q12H   sodium chloride flush  3 mL Intravenous Q12H   Continuous Infusions:  ampicillin-sulbactam (UNASYN) IV 1.5 g (08/15/23 0325)   doxycycline (VIBRAMYCIN) IV 100 mg (08/14/23 2117)      LOS: 6 days   Marinda Elk  [facility]  08/15/2023, 9:47 AM

## 2023-08-15 NOTE — Transfer of Care (Signed)
 Immediate Anesthesia Transfer of Care Note  Patient: Eric Choi  Procedure(s) Performed: AMPUTATION, TOE (Right: Toe)  Patient Location: PACU  Anesthesia Type:MAC  Level of Consciousness: awake and alert   Airway & Oxygen Therapy: Patient Spontanous Breathing and Patient connected to face mask oxygen  Post-op Assessment: Report given to RN and Post -op Vital signs reviewed and stable  Post vital signs: Reviewed and stable  Last Vitals:  Vitals Value Taken Time  BP 109/57 08/15/23 0803  Temp    Pulse 79 08/15/23 0806  Resp 22 08/15/23 0806  SpO2 95 % 08/15/23 0806  Vitals shown include unfiled device data.  Last Pain:  Vitals:   08/15/23 0652  TempSrc: Oral  PainSc:       Patients Stated Pain Goal: 0 (08/12/23 0000)  Complications: No notable events documented.

## 2023-08-15 NOTE — TOC Initial Note (Signed)
 Transition of Care [facility]) - Initial/Assessment Note    Patient Details  Name: Eric Choi MRN: [id number] Date of Birth: [birth year]  [facility] CM/SW Contact:    Gala Lewandowsky, RN Phone Number: 08/15/2023, 3:40 PM  Clinical Narrative: Patient presented for right second toe infection-osteomyelitis-post amputation. PTA patient was from home with spouse. Plan to return home once stable. No home needs identified at this time.                Expected Discharge Plan: Home w Home Health Services Barriers to Discharge: No Barriers Identified   Patient Goals and CMS Choice Patient states their goals for this hospitalization and ongoing recovery are:: Plan to return home once stable  Expected Discharge Plan and Services   Discharge Planning Services: CM Consult Post Acute Care Choice: Home Health Living arrangements for the past 2 months: Single Family Home   Prior Living Arrangements/Services Living arrangements for the past 2 months: Single Family Home Lives with:: Spouse Patient language and need for interpreter reviewed:: Yes Do you feel safe going back to the place where you live?: Yes        Criminal Activity/Legal Involvement Pertinent to Current Situation/Hospitalization: No - Comment as needed  Activities of Daily Living   ADL Screening (condition at time of admission) Independently performs ADLs?: Yes (appropriate for developmental age) Is the patient deaf or have difficulty hearing?: No Does the patient have difficulty seeing, even when wearing glasses/contacts?: No Does the patient have difficulty concentrating, remembering, or making decisions?: No  Permission Sought/Granted Permission sought to share information with : Family Supports, Case Manager   Emotional Assessment Appearance:: Appears stated age Attitude/Demeanor/Rapport: Engaged Affect (typically observed): Appropriate Orientation: : Oriented to Self, Oriented to Place,  Oriented to  Time, Oriented to Situation Alcohol / Substance Use: Not Applicable Psych Involvement: No (comment)  Admission diagnosis:  Wound infection [T14.8XXA, L08.9] PAD (peripheral artery disease) (HCC) [I73.9] History of diabetes mellitus [Z86.39] Osteomyelitis of right foot, unspecified type (HCC) [M86.9] Chronic kidney disease, unspecified CKD stage [N18.9] Patient Active Problem List   Diagnosis Date Noted   Osteomyelitis of second toe of right foot (HCC) 08/15/2023   Wound infection 08/09/2023   Osteomyelitis (HCC) 08/09/2023   Toe infection 08/09/2023   Pseudophakia, both eyes 01/28/2022   Stenosis of right carotid artery 12/31/2020   Vascular claudication (HCC) 02/07/2020   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 (HCC) 12/27/2019   Retinal hemorrhage, bilateral 12/27/2019   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   Vitreomacular adhesion of left eye 12/27/2019   Vitreomacular adhesion of right eye 12/27/2019   Obesity 10/19/2018   BPH associated with nocturia 03/12/2015   Dysplastic nevi 02/25/2014   Abdominal pain, left upper quadrant 07/26/2013   CAD (coronary artery disease) 10/01/2011   Uncontrolled type 2 diabetes mellitus with circulatory disorder, with long-term current use of insulin 07/13/2010   FOOT PAIN, RIGHT 06/01/2007   Hyperlipidemia 11/03/2006   Essential hypertension 11/03/2006   PCP:  Shirline Frees, NP Pharmacy:   [facility] [id number] - [place], [place] - [address] [place] [address] Phone: [phone] Fax: [phone]  [place] [facility] [address] [place] [address] Phone: [phone] Fax: [phone]  Social Drivers of Health (SDOH) Social History: SDOH Screenings   Food Insecurity: No Food Insecurity (08/09/2023)  Housing: Low Risk  (08/09/2023)  Transportation Needs:  No  Transportation Needs (08/09/2023)  Utilities: Not At Risk (08/09/2023)  Depression (PHQ2-9): Low Risk  (06/17/2023)  Financial Resource Strain: Low Risk  (01/31/2020)  Social Connections: Socially Isolated (08/09/2023)  Tobacco Use: Medium Risk (08/15/2023)   Readmission Risk Interventions     No data to display

## 2023-08-16 ENCOUNTER — Other Ambulatory Visit (HOSPITAL_COMMUNITY): Payer: Self-pay

## 2023-08-16 ENCOUNTER — Encounter (HOSPITAL_COMMUNITY): Payer: Self-pay | Admitting: Podiatry

## 2023-08-16 DIAGNOSIS — N189 Chronic kidney disease, unspecified: Secondary | ICD-10-CM | POA: Diagnosis not present

## 2023-08-16 DIAGNOSIS — M869 Osteomyelitis, unspecified: Secondary | ICD-10-CM | POA: Diagnosis not present

## 2023-08-16 DIAGNOSIS — M86171 Other acute osteomyelitis, right ankle and foot: Secondary | ICD-10-CM | POA: Diagnosis not present

## 2023-08-16 DIAGNOSIS — N401 Enlarged prostate with lower urinary tract symptoms: Secondary | ICD-10-CM | POA: Diagnosis not present

## 2023-08-16 LAB — BASIC METABOLIC PANEL WITH GFR
Anion gap: 10 (ref 5–15)
BUN: 29 mg/dL — ABNORMAL HIGH (ref 8–23)
CO2: 20 mmol/L — ABNORMAL LOW (ref 22–32)
Calcium: 8.9 mg/dL (ref 8.9–10.3)
Chloride: 106 mmol/L (ref 98–111)
Creatinine, Ser: 2.11 mg/dL — ABNORMAL HIGH (ref 0.61–1.24)
GFR, Estimated: 31 mL/min — ABNORMAL LOW (ref >60)
Glucose, Bld: 182 mg/dL — ABNORMAL HIGH (ref 70–99)
Potassium: 4.3 mmol/L (ref 3.5–5.1)
Sodium: 136 mmol/L (ref 135–145)

## 2023-08-16 LAB — GLUCOSE, CAPILLARY: Glucose-Capillary: 160 mg/dL — ABNORMAL HIGH (ref 70–99)

## 2023-08-16 LAB — SURGICAL PATHOLOGY

## 2023-08-16 MED ORDER — AMOXICILLIN-POT CLAVULANATE 875-125 MG PO TABS
1.0000 | ORAL_TABLET | Freq: Two times a day (BID) | ORAL | Status: DC
  Administered 2023-08-16: 1 via ORAL
  Filled 2023-08-16: qty 1

## 2023-08-16 MED ORDER — CLOPIDOGREL BISULFATE 75 MG PO TABS
75.0000 mg | ORAL_TABLET | Freq: Every day | ORAL | 1 refills | Status: DC
  Filled 2023-08-16: qty 30, 30d supply, fill #0

## 2023-08-16 MED ORDER — DOXYCYCLINE HYCLATE 100 MG PO TABS
100.0000 mg | ORAL_TABLET | Freq: Two times a day (BID) | ORAL | 0 refills | Status: AC
Start: 2023-08-16 — End: 2023-08-23
  Filled 2023-08-16: qty 14, 7d supply, fill #0

## 2023-08-16 MED ORDER — AMOXICILLIN-POT CLAVULANATE 875-125 MG PO TABS
1.0000 | ORAL_TABLET | Freq: Two times a day (BID) | ORAL | 0 refills | Status: AC
  Filled 2023-08-16: qty 14, 7d supply, fill #0

## 2023-08-16 MED ORDER — DOXYCYCLINE HYCLATE 100 MG PO TABS
100.0000 mg | ORAL_TABLET | Freq: Two times a day (BID) | ORAL | Status: DC
  Administered 2023-08-16: 100 mg via ORAL
  Filled 2023-08-16: qty 1

## 2023-08-16 NOTE — Care Management Important Message (Signed)
 Important Message  Patient Details  Name: Eric Choi MRN: [id number] Date of Birth: 08/25/1940   Important Message Given:  Yes - Medicare IM     Renie Ora 08/16/2023, 10:24 AM

## 2023-08-16 NOTE — Plan of Care (Signed)
  Problem: Education: Goal: Knowledge of General Education information will improve Description: Including pain rating scale, medication(s)/side effects and non-pharmacologic comfort measures Outcome: Adequate for Discharge   Problem: Health Behavior/Discharge Planning: Goal: Ability to manage health-related needs will improve Outcome: Adequate for Discharge   Problem: Clinical Measurements: Goal: Ability to maintain clinical measurements within normal limits will improve Outcome: Adequate for Discharge Goal: Will remain free from infection Outcome: Adequate for Discharge Goal: Diagnostic test results will improve Outcome: Adequate for Discharge Goal: Respiratory complications will improve Outcome: Adequate for Discharge Goal: Cardiovascular complication will be avoided Outcome: Adequate for Discharge   Problem: Activity: Goal: Risk for activity intolerance will decrease Outcome: Adequate for Discharge   Problem: Nutrition: Goal: Adequate nutrition will be maintained Outcome: Adequate for Discharge   Problem: Elimination: Goal: Will not experience complications related to bowel motility Outcome: Adequate for Discharge Goal: Will not experience complications related to urinary retention Outcome: Adequate for Discharge   Problem: Pain Managment: Goal: General experience of comfort will improve and/or be controlled Outcome: Adequate for Discharge   Problem: Safety: Goal: Ability to remain free from injury will improve Outcome: Adequate for Discharge   Problem: Skin Integrity: Goal: Risk for impaired skin integrity will decrease Outcome: Adequate for Discharge   Problem: Education: Goal: Ability to describe self-care measures that may prevent or decrease complications (Diabetes Survival Skills Education) will improve Outcome: Adequate for Discharge Goal: Individualized Educational Video(s) Outcome: Adequate for Discharge   Problem: Fluid Volume: Goal: Ability to  maintain a balanced intake and output will improve Outcome: Adequate for Discharge   Problem: Health Behavior/Discharge Planning: Goal: Ability to identify and utilize available resources and services will improve Outcome: Adequate for Discharge Goal: Ability to manage health-related needs will improve Outcome: Adequate for Discharge   Problem: Metabolic: Goal: Ability to maintain appropriate glucose levels will improve Outcome: Adequate for Discharge   Problem: Nutritional: Goal: Maintenance of adequate nutrition will improve Outcome: Adequate for Discharge Goal: Progress toward achieving an optimal weight will improve Outcome: Adequate for Discharge   Problem: Tissue Perfusion: Goal: Adequacy of tissue perfusion will improve Outcome: Adequate for Discharge   Problem: Education: Goal: Understanding of CV disease, CV risk reduction, and recovery process will improve Outcome: Adequate for Discharge Goal: Individualized Educational Video(s) Outcome: Adequate for Discharge   Problem: Cardiovascular: Goal: Ability to achieve and maintain adequate cardiovascular perfusion will improve Outcome: Adequate for Discharge Goal: Vascular access site(s) Level 0-1 will be maintained Outcome: Adequate for Discharge

## 2023-08-16 NOTE — Discharge Summary (Signed)
 Physician Discharge Summary  Eric Choi ZOX:[id number] DOB: 22-Jun-1940 DOA: 08/09/2023  PCP: Shirline Frees, NP  Admit date: 08/09/2023 Discharge date: 08/16/2023  Admitted From: Home Disposition:  home  Recommendations for Outpatient Follow-up:  Follow up with podiatrist in 1-2 weeks Please obtain BMP/CBC in one week   Home Health:No Equipment/Devices:None  Discharge Condition:Stable CODE STATUS:Full Diet recommendation: Heart Healthy  Brief/Interim Summary:  83 y.o. male past medical history of essential hypertension, hyperlipidemia CAD uncontrolled diabetes mellitus type 2 insulin-dependent sent from the podiatrist office due to right second toe infection and osteomyelitis, care of the foot confirmed lytic lesion of the distal tuft of the second phalange.   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 Start empirically on antibiotics. Vascular surgery was consulted and recommended angiogram with intervention on 08/11/2023 and 08/12/2023 with angioplasty and stenting the procedure was divided into 2 procedures due to the contrast load.  Vascular recommended to continue aspirin Plavix and statins. Podiatrist was consulted to perform amputation of the second toe. They recommended continue Augmentin and doxycycline for 7 days postdischarge.  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  Chronic kidney see stage  IIIb: His creatinine remained baseline around 2 continue bicarbonate tablets no changes made to his medication.  CAD/PAD/hyperlipidemia:  continue statin, aspirin Plavix and fenofibrate.  Essential hypertension: No changes made to his medication we will continue his regimen at home.  Discharge Instructions  Discharge Instructions     Diet - low sodium heart healthy   Complete by: As directed    Increase activity slowly   Complete by: As directed    No wound care   Complete by: As directed       Allergies as of 08/16/2023   No Known Allergies      Medication List     STOP taking these medications    Semaglutide (1 MG/DOSE) 4 MG/3ML Sopn       TAKE these medications    acetaminophen 500 MG tablet Commonly known as: TYLENOL Take 500 mg by mouth every 6 (six) hours as needed for moderate pain.   amLODipine 10 MG tablet Commonly known as: NORVASC TAKE 1 TABLET BY MOUTH DAILY   amoxicillin-clavulanate 875-125 MG tablet Commonly known as: AUGMENTIN Take 1 tablet by mouth every 12 (twelve) hours for 7 days.   aspirin 81 MG tablet Take 81 mg by mouth daily.   atorvastatin 40 MG tablet Commonly known as: LIPITOR Take 1 tablet (40 mg total) by mouth daily.   cholecalciferol 25 MCG (1000 UNIT) tablet Commonly known as: VITAMIN D3 Take 1,000 Units by mouth daily.   clopidogrel 75 MG tablet Commonly known as: PLAVIX Take 1 tablet (75 mg total) by mouth daily with breakfast. Start taking on: August 17, 2023   doxycycline 100 MG tablet Commonly known as: VIBRA-TABS Take 1 tablet (100 mg total) by mouth every 12 (twelve) hours for 7 days.   ezetimibe 10 MG tablet Commonly known as: ZETIA TAKE 1  TABLET BY MOUTH DAILY   famotidine 20 MG tablet Commonly known as: PEPCID Take 20 mg by mouth daily as needed for heartburn or indigestion.   FreeStyle Libre 2 Reader [employer] Use as instructed   [employer] 2 Sensor Misc USE AND CHANGE EVERY 14 DAYS AS DIRECTED    gemfibrozil 600 MG tablet Commonly known as: LOPID TAKE 1 TABLET BY MOUTH DAILY AT 2:00 P.M. What changed: See the new instructions.   Insupen Pen Needles 31G X 5 MM Misc Generic drug: Insulin Pen Needle Use as directed with insulin.   lisinopril 20 MG tablet Commonly known as: ZESTRIL Take 20 mg by mouth daily.   METAMUCIL FIBER PO Take 1 Dose by mouth daily. 1 dose= 1 tablespoon   metoprolol tartrate 50 MG tablet Commonly known as: LOPRESSOR TAKE 1 TABLET BY MOUTH DAILY   NEEDLE (DISP) 30 G 30G X 1" Misc by Does not apply route as directed.   NovoLOG FlexPen 100 UNIT/ML FlexPen Generic drug: insulin aspart Inject 15-35 Units into the skin 3 (three) times daily before meals.   sodium bicarbonate 650 MG tablet Take 650 mg by mouth 2 (two) times daily.   Evaristo Bury FlexTouch 100 UNIT/ML FlexTouch Pen Generic drug: insulin degludec Inject 56 Units into the skin 2 (two) times daily.        No Known Allergies  Consultations: Vascular surgery Podiatrist   Procedures/Studies: DG Foot 2 Views Right Result Date: 08/15/2023 CLINICAL DATA:  Postoperative state. EXAM: RIGHT FOOT - 2 VIEW COMPARISON:  Right foot radiograph dated 08/09/2023. FINDINGS: Amputation of the midportion of the proximal phalanx of the second digit. No acute fracture or dislocation. Vascular calcifications noted. IMPRESSION: Amputation of the midportion of the proximal phalanx of the second digit. Electronically Signed   By: Elgie Collard M.D.   On: 08/15/2023 12:23   PERIPHERAL VASCULAR CATHETERIZATION Result Date: 08/12/2023 Table formatting from the original result was not included. DATE OF SERVICE: 08/12/2023  PATIENT:  Eric Choi  83 y.o. male  PRE-OPERATIVE DIAGNOSIS:  Atherosclerosis of native arteries of right lower extremity causing ulceration  POST-OPERATIVE DIAGNOSIS:  Same  PROCEDURE:  1) Ultrasound guided left common femoral artery access 2) Right lower extremity angiogram with third  order cannulation 3) Right femoropopliteal intravascular lithotripsy (5x19mm Shockwave) 3) Right femoropopliteal angioplasty and stenting (7x147mm Eluvia) 4) Conscious sedation (83 minutes)  SURGEON:  Rande Brunt. Lenell Antu, MD  ASSISTANT: none  ANESTHESIA:   local and IV sedation  ESTIMATED BLOOD LOSS: minimal  LOCAL MEDICATIONS USED:  LIDOCAINE  COUNTS: confirmed correct.  PATIENT DISPOSITION:  PACU - hemodynamically stable.  Delay start of Pharmacological VTE agent (>24hrs) due to surgical blood loss or risk of bleeding: no  INDICATION FOR PROCEDURE: Eric Choi is a 83 y.o. 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 After careful discussion of risks, benefits, and alternatives the patient was offered angiography. The patient understood and wished to proceed.  OPERATIVE FINDINGS:  Left renal artery: not studied Right renal artery: not studied Infrarenal aorta: not studied Left common iliac artery: not studied Right common iliac artery: not studied Left internal iliac artery: not studied Right internal iliac artery: not studied Left external iliac artery: not studied Right external iliac artery: not studied Left common femoral artery: not studied Right common femoral artery: patent Left profunda femoris artery: not studied Right profunda femoris artery: patent Left superficial femoral artery: not studied Right superficial femoral artery: patent proximally;  occluded distally at [place]. Heavily calcified plaque distally Left popliteal artery: not studied Right popliteal artery: reconstitutes above the knee; patent behind and below the knee Left anterior tibial artery: not studied Right anterior tibial artery: patent to the foot Left tibioperoneal trunk: not studied Right tibioperoneal trunk: patent Left peroneal artery: not studied Right peroneal artery: patent to the ankle Left posterior tibial artery: not studied Right posterior tibial  artery: patent proximally, occludes distally Left pedal circulation: not studied Right pedal circulation: fills via AT  DESCRIPTION OF PROCEDURE: After identification of the patient in the pre-operative holding area, the patient was transferred to the operating room. The patient was positioned supine on the operating room table. Anesthesia was induced. The groins was prepped and draped in standard fashion. A surgical pause was performed confirming correct patient, procedure, and operative location.  The left groin was anesthetized with subcutaneous injection of 1% lidocaine. Using ultrasound guidance, the left common femoral artery was accessed with micropuncture technique. Fluoroscopy was used to confirm cannulation over the femoral head. The 70F micropuncture sheath was upsized to 323F.  The right common iliac artery was selected with an omniflush catheter and glidewire advantage guidewire. The wire was advanced into the common femoral artery. Over the wire the omni flush catheter was advanced into the external iliac artery. Selective angiography was performed - see above for details.  The decision was made to intervene. The patient was heparinized with 10,000 units of heparin. The 323F sheath was exchanged for a 23F x 45cm sheath. Selective angiography of the left lower extremity was performed prior to intervention.  The case was highly challenging.  His iliac anatomy was very tortuous.  His obese groins made delivering a sheath into the external iliac artery challenging.  Ultimately, I was able to cross the occluded femoral-popliteal segment with a Glidewire and quick cross catheter.  During my initial intervention, I elected to use a 7 x 150 mm Eluvia to primarily stent the lesion as it was heavily calcified.  The stent fractured during deployment and had to be carefully retrieved.  About 20 mm of the stent remained and deployed, I was able to retrieve the rest.  I then performed balloon assisted intravascular  lithotripsy of the lesion.  After this and angioplasty with a 5 x 150 mm Mustang balloon, I was able to cross the lesion without difficulty with an additional 7 x 150 mm Eluvia.  This was postdilated with a 6 x 150 mm Mustang balloon.  Completion angiogram showed recanalization of the femoral-popliteal occlusion.  During the course of the case a small hematoma acuminate in the groin.  Elected to remove all investor equipment and hold pressure in the room to obtain hemostasis.  Heparin was reversed with protamine.  Conscious sedation was administered with the use of IV fentanyl and midazolam under continuous physician and nurse monitoring.  Heart rate, blood pressure, and oxygen saturation were continuously monitored.  Total sedation time was 83 minutes  Upon completion of the case instrument and sharps counts were confirmed correct. The patient was transferred to the PACU in good condition. I was present for all portions of the procedure.  PLAN: Aspirin, Plavix, statin therapies.  Follow-up with me or Dr. Chestine Spore in 4 weeks with ankle-brachial index and arterial duplex.  Fractured stent is covered by normal, intact stent; no specific attention needed to this in follow-up.  Rande Brunt. Lenell Antu, MD [facility] Phone Number: [phone] 08/12/2023 5:01 PM  PERIPHERAL VASCULAR CATHETERIZATION Result Date: 08/11/2023 Images from the original result were not included.   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  Ultrasound-guided access left common femoral artery 2.  CO2 aortogram with catheter selection of aorta 3.  Ultrasound-guided access right common femoral artery for retrograde recanalization of occluded right common iliac  artery 4.  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  Angioplasty and stent of the right external iliac artery high-grade stenosis (7 mm x 40 mm Eluvia postdilated with 6 mm and 7 mm Mustang) 6.  Right lower extremity angiogram with CO2 using runoff from right femoral sheath 7.  92 minutes of monitored moderate conscious sedation time  Indications: Patient is an 83 year old male with tissue loss in the right lower extremity with osteomyelitis of his right second toe.  He is scheduled for lower extremity angiogram with possible intervention with a focus on the right leg.  He presents after risk benefits discussed.  Contrast: 50 mL, rest of case with CO2  Findings:  Ultrasound-guided access left common femoral artery.  CO2 aortogram showed patent infrarenal aorta with right common iliac artery occlusion.  The hypogastric on the right was patent.  The right external iliac had a high-grade calcified 90% stenosis.  Distally the right common femoral was patent with some posterior plaque with a patent SFA and profunda bifurcation proximally.  I tried to come antegrade from the aorta with left femoral access to get through the right common iliac occlusion unsuccessfully.  I then accessed the right common femoral artery for retrograde access.  Ultimately I was in a dissection plane and could not get through the true lumen retrograde.  Finally I was able to get a wire down from left femoral access that was using a crossover catheter with a wire in the aorta and got a wire down the right iliac occlusion and the wire was snared from the right femoral sheath for through and through access.  I then placed a 7 [place] Brite tip sheath would not advance through the common iliac occlusion.  The sheath on the right would not advance through the right common iliac artery occlusion.  I had to predilate the right common iliac artery occlsuion with a 4 mm and  5 mm Mustang.  Finally got a Brite tip sheath into the aorta from right femoral access.  I then treated this right common iliac occlusion from the aortic bifurcation down to the hypogastric with a 9 mm x 39 mm VBX and 9 mm x 29 mm VBX.  I then treated the right external iliac high-grade stenosis just distal to the hypogastric with a 7 mm x 40 mm Eluvia initially postdilated to 6 mm and then I upsized to a 7 mm angioplasty balloon to get more luminal gain.  At completion stents were widely patent with filling of the hypogastric.  I did get right lower extremity runoff with CO2 showing distal SFA/ above knee popliteal occlusion and patent below knee popliteal occlusion with two-vessel runoff and anterior tibial and posterior tibial.  Procedure:  The patient was identified in the holding area and taken to room 8.  The patient was then placed supine on the table and prepped and draped in the usual sterile fashion.  A time out was called.  The patient received Versed and fentanyl for conscious moderate sedation.  Vital signs were monitored including heart rate, respiratory rate, oxygenation and blood pressure.  I was present for all moderate sedation.  Ultrasound was used to evaluate the left common femoral artery.  It was patent .  A digital ultrasound image was acquired.  A micropuncture needle was used to access the left common femoral artery under ultrasound guidance.  An 018 wire was advanced without resistance and a micropuncture sheath was placed.  The 018 wire was removed and a benson wire was placed.  The micropuncture sheath was exchanged for a 5 french sheath.  An omniflush catheter was advanced over the wire to the level of L-1.  An abdominal angiogram was obtained with CO2.  This demonstrated right common iliac occlusion as noted above.  I tried to use an Omni Flush catheter with a Glidewire to get down the right iliac occlusion antegrade from left femoral access with a wire and catheter in the  aorta.  This initially was unsuccessful.  I then accessed the right common femoral artery with ultrasound using a micro access needle placed a microwire and a microsheath and then a Bentson wire and a short 5 [place] sheath.  I then used a guidewire advantage with a BER 2 catheter to come retrograde and ultimately upsized to a 7 [place] Brite tip sheath.  The patient was given 100 units/kg IV heparin.  ACT was checked during the case.   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  Also tried a glide cath unsuccessfully.  I then came back from our left femoral sheath with a crossover catheter and a Glidewire and finally I was able to get a wire down through the right common iliac occlusion antegrade into the right external iliac artery.  I then snared this wire from right femoral sheath and had through and through access with a 4 French snare.  I tried advancing the 7 French sheath over the wire from the right groin into the aorta but this would not advance.  I then advanced a bare 2 catheter exchanged for a Glidewire advantage for more support to the right femoral sheath.  I had to treat the right common iliac occlusion with a 4 mm and 5 mm Mustang's before I could get the sheath to advance as this would not advance without predilation given the chronic total occlusion.  Finally I selected a 9 mm x 39 mm VBX deployed in the proximal right common iliac at the aortic bifurcation and had to extend with a second 9 mm x 29 mm VBX with preservation of the hypogastric.  I then got a planning image and went ahead and extended with a 7 mm x 40 mm drug-coated Eluvia in the right external iliac artery just beyond the hypogastric.  This was postdilated with a 6 mm Mustang but I had over 30% residual stenosis so I upsized to a 7 mm Mustang.  Much more satisfied with the  results.  After stenting the right iliac, flow down the right iliac was now as quick as the left  side with no significant residual stenosis in the iliac.  I then elected to shoot the runoff in the right leg with CO2 given we already used 50 cc of contrast with his chronic kidney disease.  Pertinent findings are noted above.  He will need staged intervention for his right SFA occlusion.  Wires and catheters were removed.  Will be taken holding for sheath removal.  Plan: Excellent results after stenting of right iliac occlusion today.  Aspirin, plavix, statin.  Will need staged intervention on the right SFA/above knee popliteal occlusion.  Unable to do this today due to contrast volume as well as right femoral sheath in place to treat right iliac occlusion.    Cephus Shelling, MD [facility] [place] Office: [phone]    MR FOOT RIGHT WO CONTRAST Result Date: 08/10/2023 CLINICAL DATA:  Infection.  Evaluate for osteomyelitis. EXAM: MRI OF THE RIGHT FOREFOOT WITHOUT CONTRAST TECHNIQUE: Multiplanar, multisequence MR imaging of the right forefoot was performed. No intravenous contrast was administered. COMPARISON:  Right foot radiographs 08/09/2023 (multiple studies) FINDINGS: Bones/Joint/Cartilage There is marrow edema seen throughout the distal phalanx of the second toe (sagittal series image 18 and coronal series 5, image 15). Mild erosion of the distal aspect of the distal phalanx of the second toe as seen on yesterday's radiographs (coronal series 6, image 15). Moderate dorsal second tarsometatarsal joint space narrowing, cartilage thinning, subchondral marrow edema, and lateral aspect of the intermediate cuneiform subchondral cystic change. Similar moderate third and mild-to-moderate first and fourth tarsometatarsal osteoarthritis. The fifth tarsometatarsal joint is not imaged. Mild great toe metatarsophalangeal joint effusion. Ligaments The Lisfranc ligament complex is intact. The  metatarsophalangeal and interphalangeal collateral ligaments appear intact. The great toe sesamoid phalangeal ligaments are intact. Muscles and Tendons There is mild edema within the intrinsic interosseous midfoot and forefoot musculature. No tendon tear is seen. Soft tissues There is again ulceration seen at the distal aspect of second toe (sagittal series 7, image 18). Moderate distal and mild proximal second toe soft tissue swelling and edema. Mild dorsal midfoot subcutaneous fat edema and swelling. No walled-off abscess is seen. IMPRESSION: 1. Ulceration at the distal aspect of the second toe. Marrow edema throughout the distal phalanx of the second toe with mild distal cortical erosion consistent with acute osteomyelitis. 2. Moderate second and third tarsometatarsal osteoarthritis. Electronically Signed   By: Neita Garnet M.D.   On: 08/10/2023 09:56   VAS [place] ABI WITH/WO TBI Result Date: 08/09/2023  LOWER EXTREMITY DOPPLER STUDY Patient Name:  Eric Choi  Date of Exam:   08/09/2023 Medical Rec #: [id number]       Accession #:    [id number] Date of Birth: 09/27/1940       Patient Gender: M Patient Age:   52 years Exam Location:  [facility] Procedure:      VAS [place] ABI WITH/WO TBI Referring Phys: BRITNI HENDERLY --------------------------------------------------------------------------------  Indications: Ulceration. High Risk Factors: Hypertension, hyperlipidemia, Diabetes, past history of                    smoking, prior MI, coronary artery disease.  Vascular Interventions: Hx of RT CEA (12/31/2020). Comparison Study: Previous exam 04/08/23 Performing Technologist: Ernestene Mention RVT/RDMS  Examination Guidelines: A complete evaluation includes at minimum, Doppler waveform signals and systolic blood pressure reading at the level of bilateral brachial, anterior tibial, and posterior tibial arteries, when vessel segments are accessible. Bilateral testing is considered an integral part of a complete  examination. Photoelectric Plethysmograph (PPG) waveforms and toe systolic pressure readings are included as required and additional duplex testing as needed. Limited examinations for reoccurring indications may be performed as noted.  ABI Findings: +---------+------------------+-----+----------+--------+ Right    Rt Pressure (mmHg)IndexWaveform  Comment  +---------+------------------+-----+----------+--------+ Brachial 159                    triphasic          +---------+------------------+-----+----------+--------+ PTA      95                0.56 monophasic         +---------+------------------+-----+----------+--------+ DP       80                0.47 monophasic         +---------+------------------+-----+----------+--------+ Great Toe27                0.16 Abnormal           +---------+------------------+-----+----------+--------+ +---------+------------------+-----+----------+-------+ Left     Lt Pressure (mmHg)IndexWaveform  Comment +---------+------------------+-----+----------+-------+ Brachial 169                    triphasic         +---------+------------------+-----+----------+-------+ PTA      140               0.83 biphasic          +---------+------------------+-----+----------+-------+ DP       95                0.56 monophasic        +---------+------------------+-----+----------+-------+ Great Toe60                0.36 Abnormal          +---------+------------------+-----+----------+-------+ +-------+-----------+-----------+------------+------------+ ABI/TBIToday's ABIToday's TBIPrevious ABIPrevious TBI +-------+-----------+-----------+------------+------------+ Right  0.56       0.16       0.68        0.41         +-------+-----------+-----------+------------+------------+ Left   0.83       0.36       0.85        0.58         +-------+-----------+-----------+------------+------------+  Right ABIs appear decreased.  Bilateral TBIs appear decreased.  Summary: Right: Resting right ankle-brachial index indicates moderate right lower extremity arterial disease. The right toe-brachial index is abnormal. Left: Resting left ankle-brachial index indicates mild left lower extremity arterial disease. The left toe-brachial index is abnormal. *See table(s) above for measurements and observations.  Electronically signed by Carolynn Sayers on 08/09/2023 at 3:01:55 PM.    Final    DG Foot Complete Right Result Date: 08/09/2023 CLINICAL DATA:  Second toe ulcer. EXAM: RIGHT FOOT COMPLETE - 3+ VIEW COMPARISON:  Radiographs of same day. FINDINGS: There is lytic destruction involving distal tuft of second distal phalanx consistent with osteomyelitis, with overlying soft tissue ulceration. Mild posterior calcaneal spurring is noted. Vascular calcifications are noted. No fracture or dislocation is noted. IMPRESSION: Lytic destruction involving distal tuft of second distal phalanx consistent with osteomyelitis with overlying soft tissue ulceration. Electronically Signed   By: Lupita Raider M.D.   On: 08/09/2023 15:01   DG Foot Complete Right Result Date: 08/09/2023 Please see detailed radiograph report in office note.    Subjective: No complaints  Discharge Exam: Vitals:   08/16/23 0355 08/16/23 0734  BP:  (!) 159/69  Pulse:  85  Resp:  18  Temp: 98.6 F (37 C) 98.2 F (36.8 C)  SpO2:  98%   Vitals:   08/15/23 2021 08/16/23 0340 08/16/23 0355 08/16/23 0734  BP: (!) 145/60 (!) 153/66  (!) 159/69  Pulse: 90 87  85  Resp: 20   18  Temp: 98.1 F (36.7 C) 98.1 F (36.7 C) 98.6 F (37 C) 98.2 F (36.8 C)  TempSrc: Oral Oral Oral Oral  SpO2:  96%  98%  Weight:      Height:        General: Pt is alert, awake, not in acute distress Cardiovascular: RRR, S1/S2 +, no rubs, no gallops Respiratory: CTA bilaterally, no wheezing, no rhonchi Abdominal: Soft, NT, ND, bowel sounds + Extremities: no edema, no  cyanosis    The results of significant diagnostics from this hospitalization (including imaging, microbiology, ancillary and laboratory) are listed below for reference.     Microbiology: Recent Results (from the past 240 hours)  MRSA Next Gen by PCR, Nasal     Status: None   Collection Time: 08/09/23  7:44 PM   Specimen: Nasal Mucosa; Nasal Swab  Result Value Ref Range Status   MRSA by PCR Next Gen NOT DETECTED NOT DETECTED Final    Comment: (NOTE) The GeneXpert MRSA Assay (FDA approved for NASAL specimens only), is one component of a comprehensive MRSA colonization surveillance program. It is not intended to diagnose MRSA infection nor to guide or monitor treatment for MRSA infections. Test performance is not FDA approved in patients less than 60 years old. Performed at [facility], [address], [place], [place] [address]   Aerobic/Anaerobic Culture w Gram Stain (surgical/deep wound)     Status: None (Preliminary result)   Collection Time: 08/15/23  7:49 AM   Specimen: PATH Digit amputation; Tissue  Result Value Ref Range Status   Specimen Description TISSUE  Final   Special Requests NONE  Final   Gram Stain   Final    RARE WBC PRESENT,BOTH PMN AND MONONUCLEAR RARE GRAM POSITIVE COCCI IN PAIRS    Culture   Final    FEW STAPHYLOCOCCUS AUREUS SUSCEPTIBILITIES TO FOLLOW Performed at [facility], [address], [place], [place] [address]    Report Status PENDING  Incomplete     Labs: BNP (last 3 results) No results for input(s): "BNP" in the last 8760 hours. Basic Metabolic Panel: Recent Labs  Lab 08/13/23 0313 08/14/23 0443 08/15/23 0516 08/15/23 1009 08/16/23 0547  NA 140 138 137 137 136  K 4.3 3.9 3.8 4.1 4.3  CL 110 109 105 105 106  CO2 22 22 22  20* 20*  GLUCOSE 116* 77 100* 172* 182*  BUN 25* 25* 25* 25* 29*  CREATININE 2.14* 2.11* 2.05* 2.16* 2.11*  CALCIUM 8.7* 8.7* 8.9 9.0 8.9   Liver Function Tests: Recent Labs  Lab 08/09/23 1130   AST 17  ALT 16  ALKPHOS 51  BILITOT 0.5  PROT 7.0  ALBUMIN 3.3*   No results for input(s): "LIPASE", "AMYLASE" in the last 168 hours. No results for input(s): "AMMONIA" in the last 168 hours. CBC: Recent Labs  Lab 08/09/23 1130 08/09/23 2003 08/11/23 1455  WBC 13.8* 12.2*  --   NEUTROABS 9.5*  --   --   HGB 13.4 13.1 13.9  HCT 42.1 39.8 41.0  MCV 91.1 88.1  --   PLT 309 279  --    Cardiac Enzymes: No results for input(s): "CKTOTAL", "CKMB", "CKMBINDEX", "TROPONINI" in the  last 168 hours. BNP: Invalid input(s): "POCBNP" CBG: Recent Labs  Lab 08/15/23 0858 08/15/23 1147 08/15/23 1619 08/15/23 2135 08/16/23 0732  GLUCAP 119* 173* 201* 223* 160*   D-Dimer No results for input(s): "DDIMER" in the last 72 hours. Hgb A1c No results for input(s): "HGBA1C" in the last 72 hours. Lipid Profile No results for input(s): "CHOL", "HDL", "LDLCALC", "TRIG", "CHOLHDL", "LDLDIRECT" in the last 72 hours. Thyroid function studies No results for input(s): "TSH", "T4TOTAL", "T3FREE", "THYROIDAB" in the last 72 hours.  Invalid input(s): "FREET3" Anemia work up No results for input(s): "VITAMINB12", "FOLATE", "FERRITIN", "TIBC", "IRON", "RETICCTPCT" in the last 72 hours. Urinalysis    Component Value Date/Time   COLORURINE YELLOW 08/09/2023 1356   APPEARANCEUR CLEAR 08/09/2023 1356   LABSPEC 1.015 08/09/2023 1356   PHURINE 5.0 08/09/2023 1356   GLUCOSEU 50 (A) 08/09/2023 1356   HGBUR SMALL (A) 08/09/2023 1356   HGBUR negative 12/27/2008 0000   BILIRUBINUR NEGATIVE 08/09/2023 1356   BILIRUBINUR n 03/05/2016 0936   KETONESUR NEGATIVE 08/09/2023 1356   PROTEINUR 30 (A) 08/09/2023 1356   UROBILINOGEN 0.2 03/05/2016 0936   UROBILINOGEN 0.2 12/27/2008 0000   NITRITE NEGATIVE 08/09/2023 1356   LEUKOCYTESUR NEGATIVE 08/09/2023 1356   Sepsis Labs Recent Labs  Lab 08/09/23 1130 08/09/23 2003  WBC 13.8* 12.2*   Microbiology Recent Results (from the past 240 hours)  MRSA Next  Gen by PCR, Nasal     Status: None   Collection Time: 08/09/23  7:44 PM   Specimen: Nasal Mucosa; Nasal Swab  Result Value Ref Range Status   MRSA by PCR Next Gen NOT DETECTED NOT DETECTED Final    Comment: (NOTE) The GeneXpert MRSA Assay (FDA approved for NASAL specimens only), is one component of a comprehensive MRSA colonization surveillance program. It is not intended to diagnose MRSA infection nor to guide or monitor treatment for MRSA infections. Test performance is not FDA approved in patients less than 9 years old. Performed at [facility], [address], [place], [place] [address]   Aerobic/Anaerobic Culture w Gram Stain (surgical/deep wound)     Status: None (Preliminary result)   Collection Time: 08/15/23  7:49 AM   Specimen: PATH Digit amputation; Tissue  Result Value Ref Range Status   Specimen Description TISSUE  Final   Special Requests NONE  Final   Gram Stain   Final    RARE WBC PRESENT,BOTH PMN AND MONONUCLEAR RARE GRAM POSITIVE COCCI IN PAIRS    Culture   Final    FEW STAPHYLOCOCCUS AUREUS SUSCEPTIBILITIES TO FOLLOW Performed at [facility], [address] [place], [place] [address]    Report Status PENDING  Incomplete     Time coordinating discharge: Over 35 minutes  SIGNED:   Marinda Elk, MD  [facility] 08/16/2023, 10:17 AM Pager   If 7PM-7AM, please contact night-coverage [email or web address] Password [email or web address]

## 2023-08-16 NOTE — TOC Transition Note (Addendum)
 Transition of Care [facility]) - Discharge Note   Patient Details  Name: Eric Choi MRN: [id number] Date of Birth: 1940-07-17  Transition of Care [facility]) CM/SW Contact:  Gala Lewandowsky, RN Phone Number: 08/16/2023, 10:32 AM   Clinical Narrative: Patient will transition home today. Patient declined home health services at this time. Patient to follow up with [facility]. No further needs identified.  Final next level of care: Home/Self Care Barriers to Discharge: No Barriers Identified   Patient Goals and CMS Choice Patient states their goals for this hospitalization and ongoing recovery are:: Plan to return home once stable  Discharge Plan and Services Additional resources added to the After Visit Summary for     Discharge Planning Services: CM Consult Post Acute Care Choice: Home Health            Social Drivers of Health (SDOH) Interventions SDOH Screenings   Food Insecurity: No Food Insecurity (08/09/2023)  Housing: Low Risk  (08/09/2023)  Transportation Needs: No Transportation Needs (08/09/2023)  Utilities: Not At Risk (08/09/2023)  Depression (PHQ2-9): Low Risk  (06/17/2023)  Financial Resource Strain: Low Risk  (01/31/2020)  Social Connections: Socially Isolated (08/09/2023)  Tobacco Use: Medium Risk (08/15/2023)   Readmission Risk Interventions     No data to display

## 2023-08-17 ENCOUNTER — Telehealth: Payer: Self-pay

## 2023-08-17 NOTE — Transitions of Care (Post Inpatient/ED Visit) (Signed)
 08/17/2023  Name: Eric Choi MRN: [id number] DOB: March 12, 1941  Today's TOC FU Call Status: Today's TOC FU Call Status:: Successful TOC FU Call Completed TOC FU Call Complete Date: 08/17/23 Patient's Name and Date of Birth confirmed.  Transition Care Management Follow-up Telephone Call Date of Discharge: 08/16/23 Discharge Facility: [facility]) Type of Discharge: Inpatient Admission Primary Inpatient Discharge Diagnosis:: Osteomyelitis How have you been since you were released from the hospital?: Better (Reports no pain. states feels well) Any questions or concerns?: No  Items Reviewed: Did you receive and understand the discharge instructions provided?: Yes Medications obtained,verified, and reconciled?: Yes (Medications Reviewed) Any new allergies since your discharge?: No Dietary orders reviewed?: Yes Type of Diet Ordered:: low sodium heart healthy diet. Do you have support at home?: Yes People in Home: spouse Name of Support/Comfort Primary Source: Harriett Sine  Medications Reviewed Today: Medications Reviewed Today     Reviewed by Earlie Server, RN (Registered Nurse) on 08/17/23 at 1203  Med List Status: <None>   Medication Order Taking? Sig Documenting Provider Last Dose Status Informant  acetaminophen (TYLENOL) 500 MG tablet [id number] Yes Take 500 mg by mouth every 6 (six) hours as needed for moderate pain. [provider] Taking Active Spouse/Significant Other, Self, Pharmacy Records  amLODipine (NORVASC) 10 MG tablet [id number] Yes TAKE 1 TABLET BY MOUTH DAILY Nafziger, Kandee Keen, NP Taking Active Self, Spouse/Significant Other, Pharmacy Records  amoxicillin-clavulanate (AUGMENTIN) 875-125 MG tablet [id number] Yes Take 1 tablet by mouth every 12 (twelve) hours for 7 days. Marinda Elk, MD Taking Active   aspirin 81 MG tablet [id number] Yes Take 81 mg by mouth daily. [provider] Taking Active Spouse/Significant Other, Self, Pharmacy Records   atorvastatin (LIPITOR) 40 MG tablet [id number] Yes Take 1 tablet (40 mg total) by mouth daily. Nafziger, Kandee Keen, NP Taking Active Self, Spouse/Significant Other, Pharmacy Records  cholecalciferol (VITAMIN D3) 25 MCG (1000 UNIT) tablet [id number] Yes Take 1,000 Units by mouth daily. [provider] Taking Active Self, Spouse/Significant Other, Pharmacy Records  clopidogrel (PLAVIX) 75 MG tablet [id number] Yes Take 1 tablet (75 mg total) by mouth daily with breakfast. Marinda Elk, MD Taking Active   Continuous Blood Gluc Receiver (FREESTYLE LIBRE 2 READER) Hardie Pulley [id number] Yes Use as instructed Carlus Pavlov, MD Taking Active Self, Spouse/Significant Other, Pharmacy Records  Continuous Glucose Sensor (FREESTYLE LIBRE 2 SENSOR) [place] [id number] Yes USE AND CHANGE EVERY 14 DAYS AS DIRECTED Carlus Pavlov, MD Taking Active Self, Spouse/Significant Other, Pharmacy Records  doxycycline (VIBRA-TABS) 100 MG tablet [id number] Yes Take 1 tablet (100 mg total) by mouth every 12 (twelve) hours for 7 days. Marinda Elk, MD Taking Active   ezetimibe (ZETIA) 10 MG tablet [id number] Yes TAKE 1 TABLET BY MOUTH DAILY Nafziger, Kandee Keen, NP Taking Active Self, Spouse/Significant Other, Pharmacy Records  famotidine (PEPCID) 20 MG tablet [id number] Yes Take 20 mg by mouth daily as needed for heartburn or indigestion. [provider] Taking Active Self, Spouse/Significant Other, Pharmacy Records  gemfibrozil (LOPID) 600 MG tablet [id number] Yes TAKE 1 TABLET BY MOUTH DAILY AT 2:00 P.M.  Patient taking differently: Take 300 mg by mouth daily at 2 PM.   Shirline Frees, NP Taking Active Self, Spouse/Significant Other, Pharmacy Records  insulin aspart (NOVOLOG FLEXPEN) 100 UNIT/ML FlexPen [id number] Yes Inject 15-35 Units into the skin 3 (three) times daily before meals. Carlus Pavlov, MD Taking Active Self, Spouse/Significant Other, Pharmacy Records           Med Note [place], SEBASTIAN  Tue Aug 09, 2023  3:35 PM) Patient typically takes 30-35 units with breakfast, 15 with lunch, and 30-35 with dinner   insulin degludec (TRESIBA FLEXTOUCH) 100 UNIT/ML FlexTouch Pen [id number] Yes Inject 56 Units into the skin 2 (two) times daily. Carlus Pavlov, MD Taking Active Self, Spouse/Significant Other, Pharmacy Records           Med Note (ROSE, Rhayne Chatwin U   Wed Aug 17, 2023 12:02 PM) Takes 25 units twice  a day.   Insulin Pen Needle (TECHLITE PEN NEEDLES) 31G X 5 MM MISC [id number] Yes Use as directed with insulin. Carlus Pavlov, MD Taking Active Self, Spouse/Significant Other, Pharmacy Records  lisinopril (ZESTRIL) 20 MG tablet [id number] Yes Take 20 mg by mouth daily. [provider] Taking Active Self, Spouse/Significant Other, Pharmacy Records  METAMUCIL FIBER PO [id number] Yes Take 1 Dose by mouth daily. 1 dose= 1 tablespoon [provider] Taking Active Spouse/Significant Other, Self, Pharmacy Records  metoprolol tartrate (LOPRESSOR) 50 MG tablet [id number] Yes TAKE 1 TABLET BY MOUTH DAILY Nafziger, Kandee Keen, NP Taking Active Self, Spouse/Significant Other, Pharmacy Records  NEEDLE, DISP, 30 G 30G X 1" MISC [id number] Yes by Does not apply route as directed. [provider] Taking Active Spouse/Significant Other, Self, Pharmacy Records  sodium bicarbonate 650 MG tablet [id number] Yes Take 650 mg by mouth 2 (two) times daily. [provider] Taking Active Self, Spouse/Significant Other, Pharmacy Records            Home Care and Equipment/Supplies: Were Home Health Services Ordered?: No (refused) Any new equipment or medical supplies ordered?: No  Functional Questionnaire: Do you need assistance with bathing/showering or dressing?: No Do you need assistance with meal preparation?: No Do you need assistance with eating?: No Do you have difficulty maintaining continence: No Do you need assistance with getting out of bed/getting out of a chair/moving?: No Do you  have difficulty managing or taking your medications?: No  Follow up appointments reviewed: PCP Follow-up appointment confirmed?: No (Encouraged patient to call MD and make an appointment) MD Provider Line [phone] Given: No Specialist Hospital Follow-up appointment confirmed?: Yes Date of Specialist follow-up appointment?: 08/23/23 Follow-Up Specialty Provider:: Dr. Barry Dienes Do you need transportation to your follow-up appointment?: No Do you understand care options if your condition(s) worsen?: Yes-patient verbalized understanding  SDOH Interventions Today    Flowsheet Row Most Recent Value  SDOH Interventions   Food Insecurity Interventions Intervention Not Indicated  Housing Interventions Intervention Not Indicated  Transportation Interventions Intervention Not Indicated  Utilities Interventions Intervention Not Indicated       Interventions Today    Flowsheet Row Most Recent Value  Chronic Disease   Chronic disease during today's visit Other  [Osteomyelitis]  General Interventions   General Interventions Discussed/Reviewed General Interventions Discussed, General Interventions Reviewed, Doctor Visits  Doctor Visits Discussed/Reviewed Doctor Visits Discussed, Doctor Visits Reviewed, PCP, Specialist  PCP/Specialist Visits Compliance with follow-up visit  Exercise Interventions   Exercise Discussed/Reviewed Physical Activity  [encouraged patient to follow MD instructions for ambualtion]  Education Interventions   Education Provided Provided Education  [reviewed imporance of DM control and follow up with PCP]  Provided Verbal Education On Nutrition, Medication, When to see the doctor  Nutrition Interventions   Nutrition Discussed/Reviewed Nutrition Discussed, Carbohydrate meal planning, Decreasing salt  Pharmacy Interventions   Pharmacy Dicussed/Reviewed Medications and their functions      TOC Interventions Today    Flowsheet Row Most Recent Value  TOC  Interventions   TOC Interventions  Discussed/Reviewed TOC Interventions Discussed, TOC Interventions Reviewed, S/S of infection, Post op wound/incision care  [denies any wound care., refused home health]       Spoke with patient today and he reports that he is doing well. Reports that he has his follow up planned with his surgeon. No PCP follow up. Encouraged patient to call and make an appointment.  Patient reports that he is walking with shoe he was provided at the hospital. Reports taking all medications as prescribed, Reports CBG level at home since discharge is 142-170. Reports that he is taking his insulin. He self adjust his medications on his own.  Reviewed and offer 30 day TOC program and patient declined.   Lonia Chimera, RN, BSN, CEN [employer]- Transition of Care Team.  [facility] [phone]

## 2023-08-20 LAB — AEROBIC/ANAEROBIC CULTURE W GRAM STAIN (SURGICAL/DEEP WOUND)

## 2023-08-23 ENCOUNTER — Ambulatory Visit (INDEPENDENT_AMBULATORY_CARE_PROVIDER_SITE_OTHER): Admitting: Podiatry

## 2023-08-23 ENCOUNTER — Encounter: Payer: Self-pay | Admitting: Podiatry

## 2023-08-23 VITALS — Ht 68.0 in | Wt 235.0 lb

## 2023-08-23 DIAGNOSIS — Z89421 Acquired absence of other right toe(s): Secondary | ICD-10-CM | POA: Diagnosis not present

## 2023-08-23 NOTE — Progress Notes (Signed)
  Subjective:  Patient ID: Eric Choi, male    DOB: 07-08-1940,  MRN: [id number]  Chief Complaint  Patient presents with   Routine Post Op    Pt is here for 1st post op visit since surgery on 3/31 amputation to right second toe, pt states everything is going well and has no complaints at the moment.    DOS: 08/15/2023 Procedure: Right second toe amputation  83 y.o. male seen for post op check.  Patient seen for first office follow-up status post above procedure approximately 1 week postop.  Denies any concerns has left the dressing clean dry and intact as instructed walking in postop shoe.  Review of Systems: Negative except as noted in the HPI. Denies N/V/F/Ch.   Objective:  There were no vitals filed for this visit. Body mass index is 35.73 kg/m. Constitutional Well developed. Well nourished.  Vascular Foot warm and well perfused. Capillary refill normal to all digits.   No calf pain with palpation  Neurologic Normal speech. Oriented to person, place, and time. Epicritic sensation diminished  Dermatologic Amputation site second toe healing very well no erythema or drainage.  Well coapted no dehiscence   Orthopedic: Status post right second toe amputation   Radiographs: Amputation of the midportion of the proximal phalanx of the second digit  Pathology: A. TOE, RIGHT SECOND, AMPUTATION:  - Benign bone with mild chronic inflammation  - Margin appears viable    Micro: MSSA  Assessment:   1. Status post amputation of toe of right foot (HCC)    Plan:  Patient was evaluated and treated and all questions answered.  POD # 8 s/p R 2nd toe amputation -Progressing as expected postoperatively, amp site healing well no dehiscence no evidence of residual infection -XR: Expected postop changes -WB Status: Weightbearing as tolerated in postop shoe -Sutures: Remain intact. -Medications/ABX: Finish course of p.o. antibiotics -Foot redressed. - Return in 1.5 weeks         Corinna Gab, DPM [facility] / [facility]

## 2023-08-25 ENCOUNTER — Other Ambulatory Visit: Payer: Self-pay | Admitting: *Deleted

## 2023-08-25 DIAGNOSIS — I6523 Occlusion and stenosis of bilateral carotid arteries: Secondary | ICD-10-CM

## 2023-08-29 ENCOUNTER — Other Ambulatory Visit: Payer: Self-pay

## 2023-08-29 DIAGNOSIS — I739 Peripheral vascular disease, unspecified: Secondary | ICD-10-CM

## 2023-08-29 DIAGNOSIS — I6523 Occlusion and stenosis of bilateral carotid arteries: Secondary | ICD-10-CM

## 2023-09-01 ENCOUNTER — Ambulatory Visit: Admitting: Podiatry

## 2023-09-01 DIAGNOSIS — E1142 Type 2 diabetes mellitus with diabetic polyneuropathy: Secondary | ICD-10-CM

## 2023-09-01 DIAGNOSIS — Z794 Long term (current) use of insulin: Secondary | ICD-10-CM

## 2023-09-01 DIAGNOSIS — I739 Peripheral vascular disease, unspecified: Secondary | ICD-10-CM

## 2023-09-01 DIAGNOSIS — Z89421 Acquired absence of other right toe(s): Secondary | ICD-10-CM | POA: Diagnosis not present

## 2023-09-01 NOTE — Progress Notes (Signed)
  Subjective:  Patient ID: Eric Choi, male    DOB: November 03, 1940,  MRN: [id number]  Chief Complaint  Patient presents with   Post-op Follow-up    Sutures intact. No excessive pain. Minimal swelling and no drainage.    DOS: 08/15/2023 Procedure: Right second toe amputation  83 y.o. male seen for post op check.  2.5 weeks status post above procedure.  Doing well wearing postop shoe has left dressing clean dry and intact as instructed denies pain.  Review of Systems: Negative except as noted in the HPI. Denies N/V/F/Ch.   Objective:  There were no vitals filed for this visit. There is no height or weight on file to calculate BMI. Constitutional Well developed. Well nourished.  Vascular Foot warm and well perfused. Capillary refill normal to all digits.   No calf pain with palpation  Neurologic Normal speech. Oriented to person, place, and time. Epicritic sensation diminished  Dermatologic Amputation site second toe nearly fully healed minimal eschar no edema no erythema    Orthopedic: Status post right second toe amputation   Radiographs: Amputation of the midportion of the proximal phalanx of the second digit  Pathology: A. TOE, RIGHT SECOND, AMPUTATION:  - Benign bone with mild chronic inflammation  - Margin appears viable    Micro: MSSA  Assessment:   1. Status post amputation of toe of right foot (HCC)   2. PAD (peripheral artery disease) (HCC)   3. Controlled type 2 diabetes mellitus with diabetic polyneuropathy, with long-term current use of insulin (HCC)     Plan:  Patient was evaluated and treated and all questions answered.   2.5 wk s/p R 2nd toe amputation -Progressing as expected postoperatively, amp site healing well no dehiscence no evidence of residual infection -XR: Expected postop changes -WB Status: Weightbearing as tolerated in postop shoe, okay to transition back to regular non tight fitting shoe -Sutures: Removed in total at this  visit -Medications/ABX: Monitor off abx -Foot redressed Band-Aid.  He is now okay to get the foot wet in the shower and then dry off after and apply Band-Aid - Return in 3 weeks for final amp site recheck        Maridee Shoemaker, DPM [facility] / [facility]

## 2023-09-05 ENCOUNTER — Ambulatory Visit (HOSPITAL_COMMUNITY)
Admission: RE | Admit: 2023-09-05 | Discharge: 2023-09-05 | Disposition: A | Discharge status: Home / Self Care | Payer: PPO | Source: Ambulatory Visit | Attending: Surgery | Admitting: Surgery

## 2023-09-05 ENCOUNTER — Ambulatory Visit: Payer: PPO | Admitting: Physician Assistant

## 2023-09-05 VITALS — BP 158/76 | HR 70 | Temp 98.4°F | Ht 68.0 in | Wt 233.6 lb

## 2023-09-05 DIAGNOSIS — I6523 Occlusion and stenosis of bilateral carotid arteries: Secondary | ICD-10-CM | POA: Diagnosis not present

## 2023-09-05 NOTE — Progress Notes (Signed)
 Office Note   History of Present Illness   Eric Choi is a 83 y.o. (10/24/1940) male who presents for surveillance of carotid artery stenosis.  He has a history of right carotid endarterectomy on 12/31/2020 by Dr. Charlotte Cookey.  He also has a history of right common and external iliac artery angioplasty and stenting on 08/11/2023 by Dr. Fulton Job.  He also recently underwent right femoropopliteal lithotripsy, angioplasty, and stenting on 08/12/2023 by Dr. Edgardo Goodwill.  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  He subsequently underwent right second toe amputation on 08/15/2023 with podiatry.  He returns today for follow-up.  He says that he is doing well.  He denies any strokelike symptoms such as slurred speech, facial droop, sudden weakness/numbness, or sudden visual changes.  He also says that his right second toe amputation is healing well.  He takes a daily aspirin , Plavix , and statin.  Current Outpatient Medications  Medication Sig Dispense Refill   acetaminophen  (TYLENOL ) 500 MG tablet Take 500 mg by mouth every 6 (six) hours as needed for moderate pain.     amLODipine  (NORVASC ) 10 MG tablet TAKE 1 TABLET BY MOUTH DAILY 90 tablet 3   aspirin  81 MG tablet Take 81 mg by mouth daily.     atorvastatin  (LIPITOR) 40 MG tablet Take 1 tablet (40 mg total) by mouth daily. 90 tablet 3   cholecalciferol  (VITAMIN D3) 25 MCG (1000 UNIT) tablet Take 1,000 Units by mouth daily.     clopidogrel  (PLAVIX ) 75 MG tablet Take 1 tablet (75 mg total) by mouth daily with breakfast. 30 tablet 1   Continuous Blood Gluc Receiver (FREESTYLE LIBRE 2 READER) DEVI Use as instructed 1 each 0   Continuous Glucose Sensor (FREESTYLE LIBRE 2 SENSOR) MISC USE AND CHANGE EVERY 14 DAYS AS DIRECTED 6 each 3   ezetimibe  (ZETIA ) 10 MG tablet TAKE 1 TABLET BY MOUTH DAILY 90 tablet 1   famotidine  (PEPCID ) 20 MG tablet Take 20 mg by mouth daily as needed for heartburn or indigestion.     gemfibrozil  (LOPID ) 600 MG tablet  TAKE 1 TABLET BY MOUTH DAILY AT 2:00 P.M. (Patient taking differently: Take 300 mg by mouth daily at 2 PM.) 90 tablet 0   insulin  aspart (NOVOLOG  FLEXPEN) 100 UNIT/ML FlexPen Inject 15-35 Units into the skin 3 (three) times daily before meals. 45 mL 1   insulin  degludec (TRESIBA  FLEXTOUCH) 100 UNIT/ML FlexTouch Pen Inject 56 Units into the skin 2 (two) times daily. 30 mL 3   Insulin  Pen Needle (TECHLITE PEN NEEDLES) 31G X 5 MM MISC Use as directed with insulin . 100 each 5   lisinopril  (ZESTRIL ) 20 MG tablet Take 20 mg by mouth daily.     METAMUCIL FIBER PO Take 1 Dose by mouth daily. 1 dose= 1 tablespoon     metoprolol  tartrate (LOPRESSOR ) 50 MG tablet TAKE 1 TABLET BY MOUTH DAILY 90 tablet 3   NEEDLE, DISP, 30 G 30G X 1" MISC by Does not apply route as directed.     sodium bicarbonate  650 MG tablet Take 650 mg by mouth 2 (two) times daily.     No current facility-administered medications for this visit.    REVIEW OF SYSTEMS (negative unless checked):   Cardiac:  []  Chest pain or chest pressure? []  Shortness of breath upon activity? []  Shortness of breath when lying flat? []  Irregular heart rhythm?  Vascular:  []  Pain in calf, thigh, or hip brought on by walking? []  Pain in  feet at night that wakes you up from your sleep? []  Blood clot in your veins? []  Leg swelling?  Pulmonary:  []  Oxygen at home? []  Productive cough? []  Wheezing?  Neurologic:  []  Sudden weakness in arms or legs? []  Sudden numbness in arms or legs? []  Sudden onset of difficult speaking or slurred speech? []  Temporary loss of vision in one eye? []  Problems with dizziness?  Gastrointestinal:  []  Blood in stool? []  Vomited blood?  Genitourinary:  []  Burning when urinating? []  Blood in urine?  Psychiatric:  []  Major depression  Hematologic:  []  Bleeding problems? []  Problems with blood clotting?  Dermatologic:  []  Rashes or ulcers?  Constitutional:  []  Fever or chills?  Ear/Nose/Throat:  []   Change in hearing? []  Nose bleeds? []  Sore throat?  Musculoskeletal:  []  Back pain? []  Joint pain? []  Muscle pain?   Physical Examination   Vitals:   09/05/23 0833 09/05/23 0835  BP: (!) 172/76 (!) 158/76  Pulse: 70   Temp: 98.4 F (36.9 C)   TempSrc: Temporal   SpO2: 95%   Weight: 233 lb 9.6 oz (106 kg)   Height: 5\' 8"  (1.727 m)    Body mass index is 35.52 kg/m.  General:  WDWN in NAD; vital signs documented above Gait: Not observed HENT: WNL, normocephalic Pulmonary: normal non-labored breathing , without rales, rhonchi,  wheezing Cardiac: regular Abdomen: soft, NT, no masses Skin: without rashes Vascular Exam/Pulses: palpable radial pulses bilaterally Extremities: Healing right second toe amputation Musculoskeletal: no muscle wasting or atrophy  Neurologic: A&O X 3;  No focal weakness or paresthesias are detected Psychiatric:  The pt has Normal affect.  Non-Invasive Vascular Imaging   Bilateral Carotid Duplex (09/05/2023):  R ICA stenosis:  1-39% R VA:  patent and antegrade L ICA stenosis:  1-39%, last year was 40-59% L VA:  patent and antegrade   Medical Decision Making   Eric Choi is a 83 y.o. male who presents for surveillance of carotid artery stenosis  Based on the patient's vascular studies, his carotid artery stenosis is unchanged bilaterally.  His right ICA stenosis is 1 to 39%.  His left ICA stenosis appears borderline between 1 to 39% and 40 to 59% He denies any strokelike symptoms such as slurred speech, facial droop, sudden visual changes, or sudden weakness/numbness.  He has no neurological deficits on exam.  He has palpable and equal radial pulses bilaterally He has a follow-up with our office in 1 week for PAD.  We can repeat carotid duplex in 1 year   Deneise Finlay PA-C [facility] Office: [phone]  Clinic MD: Charlotte Cookey

## 2023-09-06 ENCOUNTER — Ambulatory Visit (HOSPITAL_COMMUNITY)
Admission: RE | Admit: 2023-09-06 | Discharge: 2023-09-06 | Disposition: A | Discharge status: Home / Self Care | Source: Ambulatory Visit | Attending: Vascular Surgery | Admitting: Vascular Surgery

## 2023-09-06 ENCOUNTER — Ambulatory Visit (HOSPITAL_BASED_OUTPATIENT_CLINIC_OR_DEPARTMENT_OTHER)
Admit: 2023-09-06 | Discharge: 2023-09-06 | Disposition: A | Discharge status: Home / Self Care | Attending: Vascular Surgery | Admitting: Vascular Surgery

## 2023-09-06 DIAGNOSIS — I739 Peripheral vascular disease, unspecified: Secondary | ICD-10-CM

## 2023-09-06 LAB — VAS US ABI WITH/WO TBI
Left ABI: 0.79
Right ABI: 1.23

## 2023-09-07 ENCOUNTER — Encounter: Payer: Self-pay | Admitting: Adult Health

## 2023-09-07 ENCOUNTER — Ambulatory Visit (INDEPENDENT_AMBULATORY_CARE_PROVIDER_SITE_OTHER): Admitting: Adult Health

## 2023-09-07 VITALS — BP 130/60 | HR 89 | Temp 97.3°F | Ht 68.0 in | Wt 237.0 lb

## 2023-09-07 DIAGNOSIS — I10 Essential (primary) hypertension: Secondary | ICD-10-CM

## 2023-09-07 DIAGNOSIS — E1165 Type 2 diabetes mellitus with hyperglycemia: Secondary | ICD-10-CM | POA: Diagnosis not present

## 2023-09-07 DIAGNOSIS — L03116 Cellulitis of left lower limb: Secondary | ICD-10-CM

## 2023-09-07 DIAGNOSIS — I2510 Atherosclerotic heart disease of native coronary artery without angina pectoris: Secondary | ICD-10-CM

## 2023-09-07 DIAGNOSIS — N1832 Chronic kidney disease, stage 3b: Secondary | ICD-10-CM | POA: Diagnosis not present

## 2023-09-07 DIAGNOSIS — E782 Mixed hyperlipidemia: Secondary | ICD-10-CM | POA: Diagnosis not present

## 2023-09-07 DIAGNOSIS — M869 Osteomyelitis, unspecified: Secondary | ICD-10-CM

## 2023-09-07 DIAGNOSIS — Z7985 Long-term (current) use of injectable non-insulin antidiabetic drugs: Secondary | ICD-10-CM | POA: Diagnosis not present

## 2023-09-07 DIAGNOSIS — I739 Peripheral vascular disease, unspecified: Secondary | ICD-10-CM | POA: Diagnosis not present

## 2023-09-07 LAB — CBC
HCT: 37.2 % — ABNORMAL LOW (ref 39.0–52.0)
Hemoglobin: 12.2 g/dL — ABNORMAL LOW (ref 13.0–17.0)
MCHC: 32.7 g/dL (ref 30.0–36.0)
MCV: 89.7 fl (ref 78.0–100.0)
Platelets: 294 10*3/uL (ref 150.0–400.0)
RBC: 4.15 Mil/uL — ABNORMAL LOW (ref 4.22–5.81)
RDW: 16.6 % — ABNORMAL HIGH (ref 11.5–15.5)
WBC: 9.7 10*3/uL (ref 4.0–10.5)

## 2023-09-07 LAB — BASIC METABOLIC PANEL WITH GFR
BUN: 28 mg/dL — ABNORMAL HIGH (ref 6–23)
CO2: 22 meq/L (ref 19–32)
Calcium: 9.1 mg/dL (ref 8.4–10.5)
Chloride: 106 meq/L (ref 96–112)
Creatinine, Ser: 2.36 mg/dL — ABNORMAL HIGH (ref 0.40–1.50)
GFR: 25.24 mL/min — ABNORMAL LOW (ref >60.00)
Glucose, Bld: 233 mg/dL — ABNORMAL HIGH (ref 70–99)
Potassium: 4.5 meq/L (ref 3.5–5.1)
Sodium: 139 meq/L (ref 135–145)

## 2023-09-07 MED ORDER — DOXYCYCLINE HYCLATE 100 MG PO CAPS: 100.0000 mg | ORAL_CAPSULE | Freq: Two times a day (BID) | ORAL | 0 refills | Status: AC

## 2023-09-07 NOTE — Progress Notes (Signed)
 Subjective:    Patient ID: Eric Choi, male    DOB: May 04, 1941, 83 y.o.   MRN: [id number]  HPI 83 year old male who  has a past medical history of CAD (coronary artery disease), Colon polyp, DIABETES MELLITUS, TYPE II (11/03/2006), Dyspnea, HYPERLIPIDEMIA (11/03/2006), HYPERTENSION (11/03/2006), Myocardial infarction (HCC), and PAD (peripheral artery disease) (HCC).  He presents to the office today for TCM visit   Admit Date 08/09/2023 Discharge Date 08/16/2023  Sent to the ER from his podiatrist office due to right second toe infection and osteomyelitis  Hospital Course   - MRI of the foot was done that showed acute osteomyelitis.  He was started empirically on antibiotics.  Vascular surgery was consulted and recommended angiogram with intervention on 08/11/2023 and 08/12/2023 with angioplasty and stenting.  His procedure was divided into 2 procedures due to the contrast load.  Vascular recommended to continue aspirin , Plavix , and statins.  Podiatry was consulted to perform amputation of the second toe.  He was placed on Augmentin  and doxycycline  for 7 days postdischarge.  Uncontrolled diabetes mellitus type 2 with peripheral vascular disease and retinopathy - Continue long-acting insulin  plus sliding scale, no changes were made during hospitalization.  He has to follow-up with endocrinology as directed  CKD stage IIIb - His creatinine remained baseline around 2, continue bicarb tablets no changes made to his medication  CAD/PAD/hyperlipidemia - Continued with statin, aspirin , Plavix , and fenofibrate   Hypertension  - Continued with home dose of Norvasc  10 mg daily, lisinopril  20 mg daily, and metoprolol  tartrate 50 mg daily.  Since being discharged from the hospital he has been seen by vascular surgery and had a right sided ultrasound of lower extremity arterial duplex done yesterday which showed  Right: 50-74% stenosis noted in the common femoral artery. 50-74% stenosis  is noted  distal to SFA/popliteal artery stent.   And VAS ABI's done which showed Right: Resting right ankle-brachial index is within normal range. The  right toe-brachial index is abnormal.   Left: Resting left ankle-brachial index indicates moderate left lower  extremity arterial disease. The left toe-brachial index is abnormal.   He feels as though surgical wound has healed well.  He denies any chest pain, shortness of breath, neurological deficits, fevers, chills since being in the hospital.  He does have a concern of redness, warmth, pain to his left lower extremity.  Reports that this has been present since his hospital discharge.  Review of Systems  Constitutional: Negative.   HENT: Negative.    Eyes: Negative.   Respiratory: Negative.    Cardiovascular: Negative.   Gastrointestinal: Negative.   Endocrine: Negative.   Genitourinary: Negative.   Musculoskeletal: Negative.   Skin:  Positive for rash and wound.  Allergic/Immunologic: Negative.   Neurological: Negative.   Hematological: Negative.   Psychiatric/Behavioral: Negative.    All other systems reviewed and are negative.  Past Medical History:  Diagnosis Date   CAD (coronary artery disease)    Colon polyp    DIABETES MELLITUS, TYPE II 11/03/2006   Dyspnea    HYPERLIPIDEMIA 11/03/2006   HYPERTENSION 11/03/2006   Myocardial infarction [facility])    PAD (peripheral artery disease) (HCC)     Social History   Socioeconomic History   Marital status: Married    Spouse name: Not on file   Number of children: 2   Years of education: Not on file   Highest education level: Not on file  Occupational History   Occupation: Retired-Truck driver  Tobacco  Use   Smoking status: Former    Current packs/day: 0.00    Types: Cigarettes    Quit date: 05/18/1991    Years since quitting: 32.3    Passive exposure: Never   Smokeless tobacco: Never  Vaping Use   Vaping status: Never Used  Substance and Sexual Activity   Alcohol use: No    Drug use: No   Sexual activity: Not Currently  Other Topics Concern   Not on file  Social History Narrative   Works 3rd shift   Regular Exercise-yes   Social Drivers of Corporate investment banker Strain: Low Risk  (01/31/2020)   Overall Financial Resource Strain (CARDIA)    Difficulty of Paying Living Expenses: Not hard at all  Food Insecurity: No Food Insecurity (08/17/2023)   Hunger Vital Sign    Worried About Running Out of Food in the Last Year: Never true    Ran Out of Food in the Last Year: Never true  Transportation Needs: No Transportation Needs (08/17/2023)   PRAPARE - Administrator, Civil Service (Medical): No    Lack of Transportation (Non-Medical): No  Physical Activity: Not on file  Stress: Not on file  Social Connections: Socially Isolated (08/09/2023)   Social Connection and Isolation Panel [NHANES]    Frequency of Communication with Friends and Family: Once a week    Frequency of Social Gatherings with Friends and Family: Once a week    Attends Religious Services: Never    Database administrator or Organizations: No    Attends Banker Meetings: Never    Marital Status: Married  Catering manager Violence: Not At Risk (08/17/2023)   Humiliation, Afraid, Rape, and Kick questionnaire    Fear of Current or Ex-Partner: No    Emotionally Abused: No    Physically Abused: No    Sexually Abused: No    Past Surgical History:  Procedure Laterality Date   ABDOMINAL AORTOGRAM N/A 08/11/2023   Procedure: ABDOMINAL AORTOGRAM;  Surgeon: Young Hensen, MD;  Location: [facility] IN[facility]ASIVE [facility];  Service: Cardiovascular;  Laterality: N/A;   AMPUTATION TOE Right 08/15/2023   Procedure: AMPUTATION, TOE;  Surgeon: Evertt Hoe, DPM;  Location: [facility] OR;  Service: Orthopedics/Podiatry;  Laterality: Right;  R 2nd toe amp   ANGIOPLASTY  20 years ago   percutaneous transluminal    CATARACT EXTRACTION  2002, 2005   bilateral   ENDARTERECTOMY Right  12/31/2020   Procedure: RIGHT CAROTID ENDARTERECTOMY;  Surgeon: Margherita Shell, MD;  Location: [facility] OR;  Service: Vascular;  Laterality: Right;   LOWER EXTREMITY ANGIOGRAPHY Right 08/11/2023   Procedure: Lower Extremity Angiography;  Surgeon: Young Hensen, MD;  Location: [facility] INVASIVE CV LAB;  Service: Cardiovascular;  Laterality: Right;   LOWER EXTREMITY ANGIOGRAPHY Right 08/12/2023   Procedure: Lower Extremity Angiography;  Surgeon: Carlene Che, MD;  Location: [facility] INVASIVE CV LAB;  Service: Cardiovascular;  Laterality: Right;   LOWER EXTREMITY INTERVENTION Right 08/11/2023   Procedure: LOWER EXTREMITY INTERVENTION;  Surgeon: Young Hensen, MD;  Location: [facility];  Service: Cardiovascular;  Laterality: Right;   LOWER EXTREMITY INTERVENTION Right 08/12/2023   Procedure: LOWER EXTREMITY INTERVENTION;  Surgeon: Carlene Che, MD;  Location: [facility];  Service: Cardiovascular;  Laterality: Right;   PATCH ANGIOPLASTY Right 12/31/2020   Procedure: PATCH ANGIOPLASTY OF RIGHT CAROTID ARTERY USING XENOSURE BOVINE PERICARDIUM PATCH;  Surgeon: Margherita Shell, MD;  Location: [facility] OR;  Service:  Vascular;  Laterality: Right;   PERIPHERAL INTRAVASCULAR LITHOTRIPSY Right 08/12/2023   Procedure: PERIPHERAL INTRAVASCULAR LITHOTRIPSY;  Surgeon: Carlene Che, MD;  Location: [facility];  Service: Cardiovascular;  Laterality: Right;    Family History  Problem Relation Age of Onset   COPD Father    Alcohol abuse Father    Diabetes Father    Cancer Sister        breast   Stroke Mother    Colon cancer Neg Hx     No Known Allergies  Current Outpatient Medications on File Prior to Visit  Medication Sig Dispense Refill   acetaminophen  (TYLENOL ) 500 MG tablet Take 500 mg by mouth every 6 (six) hours as needed for moderate pain.     amLODipine  (NORVASC ) 10 MG tablet TAKE 1 TABLET BY MOUTH DAILY 90 tablet 3   aspirin  81 MG tablet Take 81 mg by mouth daily.      atorvastatin  (LIPITOR) 40 MG tablet Take 1 tablet (40 mg total) by mouth daily. 90 tablet 3   cholecalciferol  (VITAMIN D3) 25 MCG (1000 UNIT) tablet Take 1,000 Units by mouth daily.     clopidogrel  (PLAVIX ) 75 MG tablet Take 1 tablet (75 mg total) by mouth daily with breakfast. 30 tablet 1   Continuous Blood Gluc Receiver (FREESTYLE LIBRE 2 READER) DEVI Use as instructed 1 each 0   Continuous Glucose Sensor (FREESTYLE LIBRE 2 SENSOR) MISC USE AND CHANGE EVERY 14 DAYS AS DIRECTED 6 each 3   ezetimibe  (ZETIA ) 10 MG tablet TAKE 1 TABLET BY MOUTH DAILY 90 tablet 1   famotidine  (PEPCID ) 20 MG tablet Take 20 mg by mouth daily as needed for heartburn or indigestion.     gemfibrozil  (LOPID ) 600 MG tablet TAKE 1 TABLET BY MOUTH DAILY AT 2:00 P.M. (Patient taking differently: Take 300 mg by mouth daily at 2 PM.) 90 tablet 0   insulin  aspart (NOVOLOG  FLEXPEN) 100 UNIT/ML FlexPen Inject 15-35 Units into the skin 3 (three) times daily before meals. 45 mL 1   insulin  degludec (TRESIBA  FLEXTOUCH) 100 UNIT/ML FlexTouch Pen Inject 56 Units into the skin 2 (two) times daily. 30 mL 3   Insulin  Pen Needle (TECHLITE PEN NEEDLES) 31G X 5 MM MISC Use as directed with insulin . 100 each 5   lisinopril  (ZESTRIL ) 20 MG tablet Take 20 mg by mouth daily.     METAMUCIL FIBER PO Take 1 Dose by mouth daily. 1 dose= 1 tablespoon     metoprolol  tartrate (LOPRESSOR ) 50 MG tablet TAKE 1 TABLET BY MOUTH DAILY 90 tablet 3   NEEDLE, DISP, 30 G 30G X 1" MISC by Does not apply route as directed.     sodium bicarbonate  650 MG tablet Take 650 mg by mouth 2 (two) times daily.     No current facility-administered medications on file prior to visit.    BP 130/60   Pulse 89   Temp (!) 97.3 F (36.3 C) (Oral)   Ht 5\' 8"  (1.727 m)   Wt 237 lb (107.5 kg)   SpO2 97%   BMI 36.04 kg/m       Objective:   Physical Exam Vitals and nursing note reviewed.  Constitutional:      Appearance: Normal appearance. He is obese.  Cardiovascular:      Rate and Rhythm: Normal rate and regular rhythm.     Pulses: Normal pulses.          Popliteal pulses are 2+ on the right side and 2+  on the left side.       Dorsalis pedis pulses are 2+ on the right side and 2+ on the left side.       Posterior tibial pulses are 2+ on the right side and 2+ on the left side.     Heart sounds: Normal heart sounds.  Pulmonary:     Effort: Pulmonary effort is normal.     Breath sounds: Normal breath sounds.  Musculoskeletal:     Comments: Surgical wound after amputation of right second toe appears to be healed well.  No signs of infection noted  Skin:    General: Skin is warm and dry.     Findings: Erythema present.       Neurological:     General: No focal deficit present.     Mental Status: He is alert and oriented to person, place, and time.  Psychiatric:        Mood and Affect: Mood normal.        Behavior: Behavior normal.        Thought Content: Thought content normal.        Judgment: Judgment normal.        Assessment & Plan:  1. 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  All questions answered to the best of my ability.  Advise follow-up with specialist as directed.  Notify any of us  for signs or symptoms of infection from surgical incision of right second toe. - CBC; Future - Basic Metabolic Panel; Future - Basic Metabolic Panel - CBC  2. Uncontrolled type 2 diabetes mellitus with hyperglycemia (HCC) -ok  to go back on Ozempic .  Follow-up with endocrinology as directed - CBC; Future - Basic Metabolic Panel; Future - Basic Metabolic Panel - CBC  3. CKD stage 3b, GFR 30-44 ml/min (HCC) -Avoid nephrotoxic agents.  Follow-up with nephrology as directed.  Stay hydrated and keep blood pressure under control - CBC; Future - Basic Metabolic Panel; Future - Basic Metabolic Panel - CBC  4. Coronary  artery disease involving native coronary artery of native heart without angina pectoris - Continue Plavix  and statin  - CBC; Future - Basic Metabolic Panel; Future - Basic Metabolic Panel - CBC  5. PAD (peripheral artery disease) (HCC) - Per vascular surgery.  - Continue Plavix  and Asa - CBC; Future - Basic Metabolic Panel; Future - Basic Metabolic Panel - CBC  6. Mixed hyperlipidemia - continue statin and fenofibrate   - CBC; Future - Basic Metabolic Panel; Future - Basic Metabolic Panel - CBC  7. Essential hypertension - No change in medication  - CBC; Future - Basic Metabolic Panel; Future - Basic Metabolic Panel - CBC  8. Cellulitis of left lower extremity - Will cover for suspected cellulitis.  He does have a follow-up appointment with vascular surgery in about a week.  Advised to have them take a look at site of suspected cellulitis and if needed he can follow-up in our office in about 7 to 10 days unless symptoms worsening - doxycycline  (VIBRAMYCIN ) 100 MG capsule; Take 1 capsule (100 mg total) by mouth 2 (two) times daily.  Dispense: 20 capsule; Refill: 0 - CBC; Future - Basic Metabolic Panel; Future - Basic Metabolic Panel - CBC  Alto Atta, NP

## 2023-09-12 NOTE — Progress Notes (Unsigned)
 Office Note   History of Present Illness   Eric Choi is a 83 y.o. (12/01/1940) male who presents for surveillance of carotid artery stenosis.  He has a history of right carotid endarterectomy on 12/31/2020 by Dr. Charlotte Cookey.  He also has a history of right common and external iliac artery angioplasty and stenting on 08/11/2023 by Dr. Fulton Job.  He also recently underwent right femoropopliteal lithotripsy, angioplasty, and stenting on 08/12/2023 by Dr. Edgardo Goodwill.  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  He subsequently underwent right second toe amputation on 08/15/2023 with podiatry.  He returns today for follow-up.  He says that he is doing well.  He denies any strokelike symptoms such as slurred speech, facial droop, sudden weakness/numbness, or sudden visual changes.  He also says that his right second toe amputation is healing well.  He takes a daily aspirin , Plavix , and statin.  Current Outpatient Medications  Medication Sig Dispense Refill   acetaminophen  (TYLENOL ) 500 MG tablet Take 500 mg by mouth every 6 (six) hours as needed for moderate pain.     amLODipine  (NORVASC ) 10 MG tablet TAKE 1 TABLET BY MOUTH DAILY 90 tablet 3   aspirin  81 MG tablet Take 81 mg by mouth daily.     atorvastatin  (LIPITOR) 40 MG tablet Take 1 tablet (40 mg total) by mouth daily. 90 tablet 3   cholecalciferol  (VITAMIN D3) 25 MCG (1000 UNIT) tablet Take 1,000 Units by mouth daily.     clopidogrel  (PLAVIX ) 75 MG tablet Take 1 tablet (75 mg total) by mouth daily with breakfast. 30 tablet 1   Continuous Blood Gluc Receiver (FREESTYLE LIBRE 2 READER) DEVI Use as instructed 1 each 0   Continuous Glucose Sensor (FREESTYLE LIBRE 2 SENSOR) MISC USE AND CHANGE EVERY 14 DAYS AS DIRECTED 6 each 3   doxycycline  (VIBRAMYCIN ) 100 MG capsule Take 1 capsule (100 mg total) by mouth 2 (two) times daily. 20 capsule 0   ezetimibe  (ZETIA ) 10 MG tablet TAKE 1 TABLET BY MOUTH DAILY 90 tablet 1   famotidine  (PEPCID ) 20  MG tablet Take 20 mg by mouth daily as needed for heartburn or indigestion.     gemfibrozil  (LOPID ) 600 MG tablet TAKE 1 TABLET BY MOUTH DAILY AT 2:00 P.M. (Patient taking differently: Take 300 mg by mouth daily at 2 PM.) 90 tablet 0   insulin  aspart (NOVOLOG  FLEXPEN) 100 UNIT/ML FlexPen Inject 15-35 Units into the skin 3 (three) times daily before meals. 45 mL 1   insulin  degludec (TRESIBA  FLEXTOUCH) 100 UNIT/ML FlexTouch Pen Inject 56 Units into the skin 2 (two) times daily. 30 mL 3   Insulin  Pen Needle (TECHLITE PEN NEEDLES) 31G X 5 MM MISC Use as directed with insulin . 100 each 5   lisinopril  (ZESTRIL ) 20 MG tablet Take 20 mg by mouth daily.     METAMUCIL FIBER PO Take 1 Dose by mouth daily. 1 dose= 1 tablespoon     metoprolol  tartrate (LOPRESSOR ) 50 MG tablet TAKE 1 TABLET BY MOUTH DAILY 90 tablet 3   NEEDLE, DISP, 30 G 30G X 1" MISC by Does not apply route as directed.     sodium bicarbonate  650 MG tablet Take 650 mg by mouth 2 (two) times daily.     No current facility-administered medications for this visit.    REVIEW OF SYSTEMS (negative unless checked):   Cardiac:  []  Chest pain or chest pressure? []  Shortness of breath upon activity? []  Shortness of breath when lying  flat? []  Irregular heart rhythm?  Vascular:  []  Pain in calf, thigh, or hip brought on by walking? []  Pain in feet at night that wakes you up from your sleep? []  Blood clot in your veins? []  Leg swelling?  Pulmonary:  []  Oxygen at home? []  Productive cough? []  Wheezing?  Neurologic:  []  Sudden weakness in arms or legs? []  Sudden numbness in arms or legs? []  Sudden onset of difficult speaking or slurred speech? []  Temporary loss of vision in one eye? []  Problems with dizziness?  Gastrointestinal:  []  Blood in stool? []  Vomited blood?  Genitourinary:  []  Burning when urinating? []  Blood in urine?  Psychiatric:  []  Major depression  Hematologic:  []  Bleeding problems? []  Problems with blood  clotting?  Dermatologic:  []  Rashes or ulcers?  Constitutional:  []  Fever or chills?  Ear/Nose/Throat:  []  Change in hearing? []  Nose bleeds? []  Sore throat?  Musculoskeletal:  []  Back pain? []  Joint pain? []  Muscle pain?   Physical Examination   There were no vitals filed for this visit.  There is no height or weight on file to calculate BMI.  General:  WDWN in NAD; vital signs documented above Gait: Not observed HENT: WNL, normocephalic Pulmonary: normal non-labored breathing , without rales, rhonchi,  wheezing Cardiac: regular Abdomen: soft, NT, no masses Skin: without rashes Vascular Exam/Pulses: palpable radial pulses bilaterally Extremities: Healing right second toe amputation Musculoskeletal: no muscle wasting or atrophy  Neurologic: A&O X 3;  No focal weakness or paresthesias are detected Psychiatric:  The pt has Normal affect.  Non-Invasive Vascular Imaging   Bilateral Carotid Duplex (09/05/2023):  R ICA stenosis:  1-39% R VA:  patent and antegrade L ICA stenosis:  1-39%, last year was 40-59% L VA:  patent and antegrade   Medical Decision Making   Eric Choi is a 83 y.o. male who presents for surveillance of carotid artery stenosis  Based on the patient's vascular studies, his carotid artery stenosis is unchanged bilaterally.  His right ICA stenosis is 1 to 39%.  His left ICA stenosis appears borderline between 1 to 39% and 40 to 59% He denies any strokelike symptoms such as slurred speech, facial droop, sudden visual changes, or sudden weakness/numbness.  He has no neurological deficits on exam.  He has palpable and equal radial pulses bilaterally He has a follow-up with our office in 1 week for PAD.  We can repeat carotid duplex in 1 year   Deneise Finlay PA-C [facility] Office: [phone]  Clinic MD: Charlotte Cookey

## 2023-09-13 ENCOUNTER — Encounter: Payer: Self-pay | Admitting: Vascular Surgery

## 2023-09-13 ENCOUNTER — Ambulatory Visit: Attending: Vascular Surgery | Admitting: Vascular Surgery

## 2023-09-13 VITALS — BP 132/63 | HR 76 | Temp 98.3°F | Resp 20 | Ht 68.0 in | Wt 235.0 lb

## 2023-09-13 DIAGNOSIS — I739 Peripheral vascular disease, unspecified: Secondary | ICD-10-CM

## 2023-09-13 MED ORDER — CLOPIDOGREL BISULFATE 75 MG PO TABS: 75.0000 mg | ORAL_TABLET | Freq: Every day | ORAL | 11 refills | Status: AC

## 2023-09-14 ENCOUNTER — Other Ambulatory Visit: Payer: Self-pay | Admitting: Adult Health

## 2023-09-15 ENCOUNTER — Telehealth: Payer: Self-pay

## 2023-09-15 NOTE — Telephone Encounter (Signed)
 Patient Assistance  Medication:Tresiba   Dosage:U200 Quantity:6  Medication:Ozempic  Dosage:1 mg Quantity:4  Medication: Novolog   Dosage:U100 Quantity:9

## 2023-09-15 NOTE — Telephone Encounter (Signed)
 Pt wife picked up his patient assistance medication today.

## 2023-09-22 ENCOUNTER — Ambulatory Visit: Admitting: Podiatry

## 2023-09-22 DIAGNOSIS — Z91199 Patient's noncompliance with other medical treatment and regimen due to unspecified reason: Secondary | ICD-10-CM

## 2023-09-22 NOTE — Progress Notes (Signed)
NS for apt

## 2023-09-26 ENCOUNTER — Other Ambulatory Visit: Payer: Self-pay | Admitting: Internal Medicine

## 2023-09-26 ENCOUNTER — Other Ambulatory Visit (HOSPITAL_COMMUNITY): Payer: Self-pay

## 2023-09-26 ENCOUNTER — Other Ambulatory Visit: Payer: Self-pay | Admitting: Adult Health

## 2023-09-26 MED ORDER — INSUPEN PEN NEEDLES 31G X 5 MM MISC
5 refills | Status: DC
  Filled 2023-09-26 – 2024-01-26 (×2): qty 100, 100d supply, fill #0

## 2023-09-27 ENCOUNTER — Other Ambulatory Visit (HOSPITAL_COMMUNITY): Payer: Self-pay

## 2023-10-06 ENCOUNTER — Encounter: Payer: Self-pay | Admitting: Podiatry

## 2023-10-06 ENCOUNTER — Ambulatory Visit (INDEPENDENT_AMBULATORY_CARE_PROVIDER_SITE_OTHER): Payer: PPO | Admitting: Podiatry

## 2023-10-06 DIAGNOSIS — B351 Tinea unguium: Secondary | ICD-10-CM | POA: Diagnosis not present

## 2023-10-06 DIAGNOSIS — Z89421 Acquired absence of other right toe(s): Secondary | ICD-10-CM | POA: Diagnosis not present

## 2023-10-06 NOTE — Progress Notes (Signed)
 This patient returns to my office for at risk foot care.  This patient requires this care by a professional since this patient will be at risk due to having amputation second toe right foot.  This patient is unable to cut nails himself since the patient cannot reach his nails.These nails are painful walking and wearing shoes. He presents to the office with male caregiver.  General Appearance  Alert, conversant and in no acute stress.  Vascular  Dorsalis pedis and posterior tibial  pulses are weakly  palpable  bilaterally.  Capillary return is within normal limits  bilaterally. Temperature is within normal limits  bilaterally.  Neurologic  Senn-Weinstein monofilament wire test within normal limits  bilaterally. Muscle power within normal limits bilaterally.  Nails Thick disfigured discolored nails with subungual debris  from hallux to fifth toes bilaterally. No evidence of bacterial infection or drainage bilaterally.   Orthopedic  No limitations of motion  feet .  No crepitus or effusions noted.  No bony pathology or digital deformities noted. Amputation second toe right foot  Skin  normotropic skin with no porokeratosis noted bilaterally.  No signs of infections or ulcers noted.     Onychomycosis  Pain in right toes  Pain in left toes  Consent was obtained for treatment procedures.   Mechanical debridement of nails 1-5  bilaterally performed with a nail nipper.  Filed with dremel without incident.    Return office visit     3 months                 Told patient to return for periodic foot care and evaluation due to potential at risk complications.   Ruffin Cotton DPM

## 2023-10-31 DIAGNOSIS — N184 Chronic kidney disease, stage 4 (severe): Secondary | ICD-10-CM | POA: Diagnosis not present

## 2023-11-07 DIAGNOSIS — E872 Acidosis, unspecified: Secondary | ICD-10-CM | POA: Diagnosis not present

## 2023-11-07 DIAGNOSIS — E559 Vitamin D deficiency, unspecified: Secondary | ICD-10-CM | POA: Diagnosis not present

## 2023-11-07 DIAGNOSIS — E1122 Type 2 diabetes mellitus with diabetic chronic kidney disease: Secondary | ICD-10-CM | POA: Diagnosis not present

## 2023-11-07 DIAGNOSIS — D631 Anemia in chronic kidney disease: Secondary | ICD-10-CM | POA: Diagnosis not present

## 2023-11-07 DIAGNOSIS — N184 Chronic kidney disease, stage 4 (severe): Secondary | ICD-10-CM | POA: Diagnosis not present

## 2023-11-07 DIAGNOSIS — I129 Hypertensive chronic kidney disease with stage 1 through stage 4 chronic kidney disease, or unspecified chronic kidney disease: Secondary | ICD-10-CM | POA: Diagnosis not present

## 2023-11-07 DIAGNOSIS — E875 Hyperkalemia: Secondary | ICD-10-CM | POA: Diagnosis not present

## 2023-12-05 ENCOUNTER — Encounter: Payer: Self-pay | Admitting: Internal Medicine

## 2023-12-05 ENCOUNTER — Ambulatory Visit: Admitting: Internal Medicine

## 2023-12-05 VITALS — BP 130/70 | HR 79 | Ht 68.0 in | Wt 234.4 lb

## 2023-12-05 DIAGNOSIS — E1159 Type 2 diabetes mellitus with other circulatory complications: Secondary | ICD-10-CM | POA: Diagnosis not present

## 2023-12-05 DIAGNOSIS — R7989 Other specified abnormal findings of blood chemistry: Secondary | ICD-10-CM | POA: Diagnosis not present

## 2023-12-05 DIAGNOSIS — Z794 Long term (current) use of insulin: Secondary | ICD-10-CM | POA: Diagnosis not present

## 2023-12-05 DIAGNOSIS — Z7985 Long-term (current) use of injectable non-insulin antidiabetic drugs: Secondary | ICD-10-CM | POA: Diagnosis not present

## 2023-12-05 DIAGNOSIS — E785 Hyperlipidemia, unspecified: Secondary | ICD-10-CM | POA: Diagnosis not present

## 2023-12-05 DIAGNOSIS — E1165 Type 2 diabetes mellitus with hyperglycemia: Secondary | ICD-10-CM | POA: Diagnosis not present

## 2023-12-05 DIAGNOSIS — E66812 Obesity, class 2: Secondary | ICD-10-CM | POA: Diagnosis not present

## 2023-12-05 LAB — POCT GLYCOSYLATED HEMOGLOBIN (HGB A1C): Hemoglobin A1C: 8.7 % — AB (ref 4.0–5.6)

## 2023-12-05 NOTE — Progress Notes (Signed)
 Patient ID: Eric Choi, male   DOB: August 02, 1940, 83 y.o.   MRN: [id number]  HPI: Eric Choi is a 83 y.o.-year-old male, returning for f/u for DM2 dx 1990, insulin -dependent since 2005, uncontrolled, with complications (CAD, CKD, PN, DR, PVD). Last visit was 4 months ago.  Interim history: No increased urination, nausea, chest pain.   He has blurry vision-transitional lenses.   Since last visit, he developed a right 2nd toe ulcer with osteomyelitis and had to have a toe amputation 08/15/2023. He mentions that he developed pain and weeping at the site after he had a callus scraped by podiatry.  Reviewed HbA1c levels: Lab Results  Component Value Date   HGBA1C 7.4 (A) 08/04/2023   HGBA1C 7.3 (A) 03/23/2023   HGBA1C 8.1 11/19/2022   He was previously on: Insulin  Before breakfast Before lunch Before dinner Bedtime  Regular 30-35 units 15-20 units 35-38 units -  NPH 35 units -  - 45 units  - Ozempic  0.5 mg weekly (PAP)  We changed to (all through PAP): - Ozempic  1 mg weekly  - Tresiba  >> Levemir  70 units at bedtime >> 40 >> Tresiba  70 >> 56 >> 48-52 >> 44 >> 40 units 2x a day - NovoLog  - 15 min before a meal 35 units before each meal >> 25-35 >> 35-40 >> 30-35 (40) units 15-20 min before each meal  (but only 15 to 20 units if you take it after the meal) We tried U500 insulin  but this was too expensive. He did not start Trulicity  due to price - 145$.  Pt checks his sugars  more than 4 times a day with his freestyle libre 2 CGM:  Prev.:  Previously:  Lowest CBG: 42 x1 >> ... 55 >> 58 >> 55 >> 50; he has hypoglycemia awareness in the 90s. Highest CBG: 444 >> ...  310 >> 369 >> 300s.  Pt's meals are: - Breakfast: cereal + milk + eggs + bacon/sausage - Lunch: egg sandwich - Dinner: meat + veggies + starch - Snacks: 1-2: including after dinner; pears + mayonnaise, milk + crackers; cake  -+ CKD - sees Dr. Jerrye with nephrology, last BUN/creatinine:  Lab Results  Component Value  Date   BUN 28 (H) 09/07/2023   CREATININE 2.36 (H) 09/07/2023  02/03/2023: 31/2.33, GFR 28, glucose 163  Lab Results  Component Value Date   MICRALBCREAT 5.5 06/09/2012   MICRALBCREAT 49.5 (H) 12/27/2008   -+ HL; last set of lipids: Lab Results  Component Value Date   CHOL 72 08/13/2023   HDL 28 (L) 08/13/2023   LDLCALC 19 08/13/2023   LDLDIRECT 69.0 09/05/2020   TRIG 124 08/13/2023   CHOLHDL 2.6 08/13/2023  On Lipitor 40, gemfibrozil  600 bid, Zetia  10.    - last eye exam was in 04/2023: + DR reportedly. Dr. Loretha.    - + numbness and tingling in his feet.  Latest foot exam: 10/06/2023 by Dr. Loreda.  He also has HTN. He had right carotid endarterectomy 12/31/2020. He has a history of claudication, which improved.  TSH is slightly elevated in the past but it normalized: Lab Results  Component Value Date   TSH 3.71 06/17/2023   TSH 3.54 06/15/2022   TSH 3.22 06/12/2021   TSH 3.67 08/26/2020   TSH 4.52 (H) 06/11/2020   TSH 4.18 06/05/2019   TSH 3.61 09/08/2018   TSH 4.64 (H) 03/09/2018   TSH 2.90 03/11/2017   TSH 2.69 03/05/2016   TSH 1.62 03/12/2015  TSH 4.04 02/01/2014   TSH 2.38 10/06/2010   TSH 2.80 12/27/2008   He lost his twin brother to cancer 05/2018.  ROS: see HPI  I reviewed pt's medications, allergies, PMH, social hx, family hx, and changes were documented in the history of present illness. Otherwise, unchanged from my initial visit note.  Past Medical History:  Diagnosis Date   CAD (coronary artery disease)    Colon polyp    DIABETES MELLITUS, TYPE II 11/03/2006   Dyspnea    HYPERLIPIDEMIA 11/03/2006   HYPERTENSION 11/03/2006   Myocardial infarction [facility])    PAD (peripheral artery disease) (HCC)    Past Surgical History:  Procedure Laterality Date   ABDOMINAL AORTOGRAM N/A 08/11/2023   Procedure: ABDOMINAL AORTOGRAM;  Surgeon: Gretta Lonni PARAS, MD;  Location: [facility] INVASIVE CV LAB;  Service: Cardiovascular;  Laterality: N/A;   AMPUTATION  TOE Right 08/15/2023   Procedure: AMPUTATION, TOE;  Surgeon: Malvin Marsa FALCON, DPM;  Location: [facility] OR;  Service: Orthopedics/Podiatry;  Laterality: Right;  R 2nd toe amp   ANGIOPLASTY  20 years ago   percutaneous transluminal    CATARACT EXTRACTION  2002, 2005   bilateral   ENDARTERECTOMY Right 12/31/2020   Procedure: RIGHT CAROTID ENDARTERECTOMY;  Surgeon: Serene Gaile ORN, MD;  Location: [facility] OR;  Service: Vascular;  Laterality: Right;   LOWER EXTREMITY ANGIOGRAPHY Right 08/11/2023   Procedure: Lower Extremity Angiography;  Surgeon: Gretta Lonni PARAS, MD;  Location: [facility] INVASIVE CV LAB;  Service: Cardiovascular;  Laterality: Right;   LOWER EXTREMITY ANGIOGRAPHY Right 08/12/2023   Procedure: Lower Extremity Angiography;  Surgeon: Magda Debby SAILOR, MD;  Location: [facility] INVASIVE CV LAB;  Service: Cardiovascular;  Laterality: Right;   LOWER EXTREMITY INTERVENTION Right 08/11/2023   Procedure: LOWER EXTREMITY INTERVENTION;  Surgeon: Gretta Lonni PARAS, MD;  Location: [facility];  Service: Cardiovascular;  Laterality: Right;   LOWER EXTREMITY INTERVENTION Right 08/12/2023   Procedure: LOWER EXTREMITY INTERVENTION;  Surgeon: Magda Debby SAILOR, MD;  Location: [facility];  Service: Cardiovascular;  Laterality: Right;   PATCH ANGIOPLASTY Right 12/31/2020   Procedure: PATCH ANGIOPLASTY OF RIGHT CAROTID ARTERY USING XENOSURE BOVINE PERICARDIUM PATCH;  Surgeon: Serene Gaile ORN, MD;  Location: [facility] OR;  Service: Vascular;  Laterality: Right;   PERIPHERAL INTRAVASCULAR LITHOTRIPSY Right 08/12/2023   Procedure: PERIPHERAL INTRAVASCULAR LITHOTRIPSY;  Surgeon: Magda Debby SAILOR, MD;  Location: [facility] IN[facility];  Service: Cardiovascular;  Laterality: Right;   Social History   Socioeconomic History   Marital status: Married    Spouse name: Not on file   Number of children: 2   Years of education: Not on file   Highest education level: Not on file  Occupational History   Occupation:  Retired-Truck driver  Tobacco Use   Smoking status: Former    Current packs/day: 0.00    Types: Cigarettes    Quit date: 05/18/1991    Years since quitting: 32.5    Passive exposure: Never   Smokeless tobacco: Never  Vaping Use   Vaping status: Never Used  Substance and Sexual Activity   Alcohol use: No   Drug use: No   Sexual activity: Not Currently  Other Topics Concern   Not on file  Social History Narrative   Works 3rd shift   Regular Exercise-yes   Social Drivers of Health   Financial Resource Strain: Low Risk  (01/31/2020)   Overall Financial Resource Strain (CARDIA)    Difficulty of Paying Living Expenses: Not hard at all  Food Insecurity: No Food Insecurity (08/17/2023)   Hunger Vital Sign    Worried About Running Out of Food in the Last Year: Never true    Ran Out of Food in the Last Year: Never true  Transportation Needs: No Transportation Needs (08/17/2023)   PRAPARE - Administrator, Civil Service (Medical): No    Lack of Transportation (Non-Medical): No  Physical Activity: Not on file  Stress: Not on file  Social Connections: Socially Isolated (08/09/2023)   Social Connection and Isolation Panel    Frequency of Communication with Friends and Family: Once a week    Frequency of Social Gatherings with Friends and Family: Once a week    Attends Religious Services: Never    Database administrator or Organizations: No    Attends Banker Meetings: Never    Marital Status: Married  Catering manager Violence: Not At Risk (08/17/2023)   Humiliation, Afraid, Rape, and Kick questionnaire    Fear of Current or Ex-Partner: No    Emotionally Abused: No    Physically Abused: No    Sexually Abused: No   Current Outpatient Medications on File Prior to Visit  Medication Sig Dispense Refill   acetaminophen  (TYLENOL ) 500 MG tablet Take 500 mg by mouth every 6 (six) hours as needed for moderate pain.     amLODipine  (NORVASC ) 10 MG tablet TAKE 1 TABLET BY  MOUTH DAILY 90 tablet 3   aspirin  81 MG tablet Take 81 mg by mouth daily.     atorvastatin  (LIPITOR) 40 MG tablet Take 1 tablet (40 mg total) by mouth daily. 90 tablet 3   cholecalciferol  (VITAMIN D3) 25 MCG (1000 UNIT) tablet Take 1,000 Units by mouth daily.     clopidogrel  (PLAVIX ) 75 MG tablet Take 1 tablet (75 mg total) by mouth daily with breakfast. 30 tablet 11   Continuous Blood Gluc Receiver (FREESTYLE LIBRE 2 READER) DEVI Use as instructed 1 each 0   Continuous Glucose Sensor (FREESTYLE LIBRE 2 SENSOR) MISC USE AND CHANGE EVERY 14 DAYS AS DIRECTED 6 each 3   doxycycline  (VIBRAMYCIN ) 100 MG capsule Take 1 capsule (100 mg total) by mouth 2 (two) times daily. 20 capsule 0   ezetimibe  (ZETIA ) 10 MG tablet TAKE 1 TABLET BY MOUTH DAILY 90 tablet 1   famotidine  (PEPCID ) 20 MG tablet Take 20 mg by mouth daily as needed for heartburn or indigestion.     gemfibrozil  (LOPID ) 600 MG tablet TAKE 1 TABLET BY MOUTH DAILY AT 2 P.M. 90 tablet 0   insulin  aspart (NOVOLOG  FLEXPEN) 100 UNIT/ML FlexPen Inject 15-35 Units into the skin 3 (three) times daily before meals. 45 mL 1   insulin  degludec (TRESIBA  FLEXTOUCH) 100 UNIT/ML FlexTouch Pen Inject 56 Units into the skin 2 (two) times daily. 30 mL 3   Insulin  Pen Needle (INSUPEN PEN NEEDLES) 31G X 5 MM MISC Use as directed with insulin . 100 each 5   lisinopril  (ZESTRIL ) 20 MG tablet Take 20 mg by mouth daily.     METAMUCIL FIBER PO Take 1 Dose by mouth daily. 1 dose= 1 tablespoon     metoprolol  tartrate (LOPRESSOR ) 50 MG tablet TAKE 1 TABLET BY MOUTH DAILY 90 tablet 3   NEEDLE, DISP, 30 G 30G X 1 MISC by Does not apply route as directed.     sodium bicarbonate  650 MG tablet Take 650 mg by mouth 2 (two) times daily.     No current facility-administered medications on file prior to  visit.   No Known Allergies Family History  Problem Relation Age of Onset   COPD Father    Alcohol abuse Father    Diabetes Father    Cancer Sister        breast   Stroke  Mother    Colon cancer Neg Hx    PE: BP 130/70   Pulse 79   Ht 5' 8 (1.727 m)   Wt 234 lb 6.4 oz (106.3 kg)   SpO2 96%   BMI 35.64 kg/m    Wt Readings from Last 3 Encounters:  12/05/23 234 lb 6.4 oz (106.3 kg)  09/13/23 235 lb (106.6 kg)  09/07/23 237 lb (107.5 kg)   Constitutional: overweight, in NAD Eyes: no exophthalmos ENT: no masses palpated in neck, no cervical lymphadenopathy Cardiovascular: RRR, No MRG Respiratory: CTA B Musculoskeletal: no deformities Skin:  no rashes Neurological: no tremor with outstretched hands  ASSESSMENT: 1. DM2, uncontrolled, insulin -dep, with complications - CAD - CKD - PN - DR  - Tried to obtain U500 insulin  but this was too expensive >> we had to go back to N and R insulin   2. HL  3.  Obesity class 1  4.  Elevated TSH  PLAN:  1. Patient with longstanding, uncontrolled, type 2 diabetes, on basal-bolus insulin  regimen and weekly GLP-1 receptor agonist, with fairly good control.  Of note, sugars improved significantly after switching from Levemir  to Tresiba .  He gets his medications through the patient assistance program.  At last visit, HbA1c was 7.4%, higher, but this also appeared to be higher than expected from his sugars at home. - At last visit, sugars were fluctuating within the target range with only 1 blood sugar in the 300s.  He had occasional high blood sugars after dinner if he forgot to take the insulin  before the meal and also had low blood sugars after the meals related to taking insulin  too late.  We did discuss about taking less insulin  for meals if he had to take it after he started eating. CGM interpretation: -At today's visit, we reviewed his CGM downloads: It appears that 61% of values are in target range (goal >70%), while 35% are higher than 180 (goal <25%), and 4% are lower than 70 (goal <4%).  The calculated average blood sugar is 160.  The projected HbA1c for the next 3 months (GMI) is 7.1%. -Reviewing the CGM  trends, sugars appear better than expected from his HbA1c today, fluctuating mainly within the target range but especially in the last week, with more hyperglycemic excursions.  Upon questioning, he forgets to take the NovoLog  before the meal and takes it after the meal.  Therefore, sugars are variable, sometimes increasing but sometimes decreasing after the meals, depending on whether he is able to take the insulin  on time.  For now, I did not suggest a change in regimen but discussed about the absolute importance of taking NovoLog  15 to 20 minutes before the meal. -I advised him to: Patient Instructions  Please continue: - Ozempic  1 mg weekly - NovoLog  - 15-20 min before a meal: 30-35 units (but only 15 to 20 units if you take it after the meal) - Tresiba  44 units 2x a day  Please return for another visit in 3-4 months.   - we checked his HbA1c: 8.7% (higher) - advised to check sugars at different times of the day - 4x a day, rotating check times - advised for yearly eye exams >> he is UTD - he  has decreased circulation in his feet (known PAD), also, almost no sensation to monofilament and onychomycosis.  He saw Dr. Loreda 07/04/2023. - I will see him back in 3 to 4 months  2. HL - Latest lipid panel from 07/2023 showed fractions at goal, except for a low HDL: Lab Results  Component Value Date   CHOL 72 08/13/2023   HDL 28 (L) 08/13/2023   LDLCALC 19 08/13/2023   LDLDIRECT 69.0 09/05/2020   TRIG 124 08/13/2023   CHOLHDL 2.6 08/13/2023  -He was previously seen in the lipid clinic.  Currently on Lipitor 40 mg daily, gemfibrozil  600 mg twice a day, and Zetia  10 mg daily.  These are tolerated well.  3.  Obesity class 2 -Before the coronavirus pandemic, he was exercising at [employer], but he stopped exercising afterwards due to back pain + sciatica and also leg weakness. -will continue Ozempic  which should also help with weight loss - He gained 7 pounds before the last 2 visits  combined  4.  Elevated TSH -He had an elevated TSH in 05/2020, however his TSH normalized afterwards: Lab Results  Component Value Date   TSH 3.71 06/17/2023  -No thyroid  symptoms - Continue to keep an eye on his TFTs  Lela Fendt, MD PhD [facility]

## 2023-12-05 NOTE — Patient Instructions (Addendum)
 Please continue: - Ozempic  1 mg weekly - NovoLog  - 15-20 min before a meal 30-35 units (but only 15 to 20 units if you take it after the meal) - Tresiba  40 units 2x a day  Please return for another visit in 3-4 months.

## 2023-12-24 ENCOUNTER — Other Ambulatory Visit: Payer: Self-pay | Admitting: Adult Health

## 2024-01-09 ENCOUNTER — Encounter: Payer: Self-pay | Admitting: Podiatry

## 2024-01-09 ENCOUNTER — Ambulatory Visit: Admitting: Podiatry

## 2024-01-09 DIAGNOSIS — Z89421 Acquired absence of other right toe(s): Secondary | ICD-10-CM

## 2024-01-09 DIAGNOSIS — B351 Tinea unguium: Secondary | ICD-10-CM

## 2024-01-09 NOTE — Progress Notes (Signed)
 This patient returns to my office for at risk foot care.  This patient requires this care by a professional since this patient will be at risk due to having amputation second toe right foot.  This patient is unable to cut nails himself since the patient cannot reach his nails.These nails are painful walking and wearing shoes. He presents to the office with male caregiver.  General Appearance  Alert, conversant and in no acute stress.  Vascular  Dorsalis pedis and posterior tibial  pulses are weakly  palpable  bilaterally.  Capillary return is within normal limits  bilaterally. Temperature is within normal limits  bilaterally.  Neurologic  Senn-Weinstein monofilament wire test within normal limits  bilaterally. Muscle power within normal limits bilaterally.  Nails Thick disfigured discolored nails with subungual debris  from hallux to fifth toes bilaterally. No evidence of bacterial infection or drainage bilaterally.   Orthopedic  No limitations of motion  feet .  No crepitus or effusions noted.  No bony pathology or digital deformities noted. Amputation second toe right foot  Skin  normotropic skin with no porokeratosis noted bilaterally.  No signs of infections or ulcers noted.     Onychomycosis  Pain in right toes  Pain in left toes  Consent was obtained for treatment procedures.   Mechanical debridement of nails 1-5  bilaterally performed with a nail nipper.  Filed with dremel without incident.    Return office visit     3 months                 Told patient to return for periodic foot care and evaluation due to potential at risk complications.   Ruffin Cotton DPM

## 2024-01-25 ENCOUNTER — Telehealth: Payer: Self-pay

## 2024-01-25 NOTE — Telephone Encounter (Signed)
 Patient Assistance  Medication:Novolog  Dosage:U100 Quantity:9 boxes.  Medication:Tresiba  Dosage:U200 Quantity:6  Medication:Ozempic  Dosage: 1 mg Quantity: 4:   Doloris Servantes,RMA

## 2024-01-26 ENCOUNTER — Other Ambulatory Visit: Payer: Self-pay

## 2024-01-26 ENCOUNTER — Telehealth: Payer: Self-pay | Admitting: Dietician

## 2024-01-26 MED ORDER — INSULIN SYRINGE-NEEDLE U-100 31G X 5/16" 1 ML MISC
12 refills | Status: AC
  Filled 2024-01-26: qty 100, 33d supply, fill #0

## 2024-01-26 NOTE — Telephone Encounter (Signed)
 Patient's wife left a message stating that her husband needs patient assistance for 3 of his medications and needed to reach [facility] the CMA.    I called patient's wife and she states that the issue has been taken care of.  Leita Constable, RD, LDN, CDCES, DipACLM

## 2024-01-26 NOTE — Telephone Encounter (Addendum)
 Patient picked up patient assistance - Log Noted

## 2024-02-06 DIAGNOSIS — N184 Chronic kidney disease, stage 4 (severe): Secondary | ICD-10-CM | POA: Diagnosis not present

## 2024-02-13 DIAGNOSIS — E1122 Type 2 diabetes mellitus with diabetic chronic kidney disease: Secondary | ICD-10-CM | POA: Diagnosis not present

## 2024-02-13 DIAGNOSIS — D631 Anemia in chronic kidney disease: Secondary | ICD-10-CM | POA: Diagnosis not present

## 2024-02-13 DIAGNOSIS — N184 Chronic kidney disease, stage 4 (severe): Secondary | ICD-10-CM | POA: Diagnosis not present

## 2024-02-13 DIAGNOSIS — I129 Hypertensive chronic kidney disease with stage 1 through stage 4 chronic kidney disease, or unspecified chronic kidney disease: Secondary | ICD-10-CM | POA: Diagnosis not present

## 2024-02-13 DIAGNOSIS — E875 Hyperkalemia: Secondary | ICD-10-CM | POA: Diagnosis not present

## 2024-02-13 DIAGNOSIS — E872 Acidosis, unspecified: Secondary | ICD-10-CM | POA: Diagnosis not present

## 2024-02-13 DIAGNOSIS — E559 Vitamin D deficiency, unspecified: Secondary | ICD-10-CM | POA: Diagnosis not present

## 2024-02-20 DIAGNOSIS — E113393 Type 2 diabetes mellitus with moderate nonproliferative diabetic retinopathy without macular edema, bilateral: Secondary | ICD-10-CM | POA: Diagnosis not present

## 2024-02-20 DIAGNOSIS — H43821 Vitreomacular adhesion, right eye: Secondary | ICD-10-CM | POA: Diagnosis not present

## 2024-02-20 DIAGNOSIS — H35043 Retinal micro-aneurysms, unspecified, bilateral: Secondary | ICD-10-CM | POA: Diagnosis not present

## 2024-02-20 DIAGNOSIS — H43822 Vitreomacular adhesion, left eye: Secondary | ICD-10-CM | POA: Diagnosis not present

## 2024-02-20 LAB — HM DIABETES EYE EXAM

## 2024-03-09 ENCOUNTER — Other Ambulatory Visit: Payer: Self-pay | Admitting: Adult Health

## 2024-04-09 ENCOUNTER — Ambulatory Visit (INDEPENDENT_AMBULATORY_CARE_PROVIDER_SITE_OTHER): Admitting: Podiatry

## 2024-04-09 ENCOUNTER — Encounter: Payer: Self-pay | Admitting: Podiatry

## 2024-04-09 DIAGNOSIS — Z89421 Acquired absence of other right toe(s): Secondary | ICD-10-CM | POA: Diagnosis not present

## 2024-04-09 DIAGNOSIS — B351 Tinea unguium: Secondary | ICD-10-CM

## 2024-04-09 NOTE — Progress Notes (Signed)
 This patient returns to my office for at risk foot care.  This patient requires this care by a professional since this patient will be at risk due to having amputation second toe right foot.  This patient is unable to cut nails himself since the patient cannot reach his nails.These nails are painful walking and wearing shoes. He presents to the office with male caregiver.  General Appearance  Alert, conversant and in no acute stress.  Vascular  Dorsalis pedis and posterior tibial  pulses are weakly  palpable  bilaterally.  Capillary return is within normal limits  bilaterally. Temperature is within normal limits  bilaterally.  Neurologic  Senn-Weinstein monofilament wire test within normal limits  bilaterally. Muscle power within normal limits bilaterally.  Nails Thick disfigured discolored nails with subungual debris  from hallux to fifth toes bilaterally. No evidence of bacterial infection or drainage bilaterally.   Orthopedic  No limitations of motion  feet .  No crepitus or effusions noted.  No bony pathology or digital deformities noted. Amputation second toe right foot  Skin  normotropic skin with no porokeratosis noted bilaterally.  No signs of infections or ulcers noted.     Onychomycosis  Pain in right toes  Pain in left toes  Consent was obtained for treatment procedures.   Mechanical debridement of nails 1-5  bilaterally performed with a nail nipper.  Filed with dremel without incident.    Return office visit     3 months                 Told patient to return for periodic foot care and evaluation due to potential at risk complications.   Ruffin Cotton DPM

## 2024-04-17 ENCOUNTER — Ambulatory Visit: Admitting: Internal Medicine

## 2024-04-17 ENCOUNTER — Encounter: Payer: Self-pay | Admitting: Internal Medicine

## 2024-04-17 VITALS — BP 136/76 | HR 72 | Resp 18 | Ht 66.0 in | Wt 238.4 lb

## 2024-04-17 DIAGNOSIS — Z794 Long term (current) use of insulin: Secondary | ICD-10-CM | POA: Diagnosis not present

## 2024-04-17 DIAGNOSIS — E785 Hyperlipidemia, unspecified: Secondary | ICD-10-CM

## 2024-04-17 DIAGNOSIS — E1159 Type 2 diabetes mellitus with other circulatory complications: Secondary | ICD-10-CM

## 2024-04-17 DIAGNOSIS — R7989 Other specified abnormal findings of blood chemistry: Secondary | ICD-10-CM

## 2024-04-17 DIAGNOSIS — E66812 Obesity, class 2: Secondary | ICD-10-CM

## 2024-04-17 DIAGNOSIS — E1165 Type 2 diabetes mellitus with hyperglycemia: Secondary | ICD-10-CM | POA: Diagnosis not present

## 2024-04-17 DIAGNOSIS — Z7985 Long-term (current) use of injectable non-insulin antidiabetic drugs: Secondary | ICD-10-CM

## 2024-04-17 LAB — POCT GLYCOSYLATED HEMOGLOBIN (HGB A1C): Hemoglobin A1C: 8.1 % — AB (ref 4.0–5.6)

## 2024-04-17 NOTE — Patient Instructions (Addendum)
 Please continue: - Ozempic  1 mg weekly  Change: - Tresiba  60 units daily at night - NovoLog  25-35 units before meals (inject 15 min before meals) Only 15 to 20 units if you take it after the meal or if you plan to be active after the meal  Please return for another visit in 3-4 months.

## 2024-04-17 NOTE — Progress Notes (Signed)
 Patient ID: Eric Choi, male   DOB: July 03, 1940, 83 y.o.   MRN: [id number]  HPI: Eric Choi is a 83 y.o.-year-old male, returning for f/u for DM2 dx 1990, insulin -dependent since 2005, uncontrolled, with complications (CAD, CKD, PN, DR, PVD). Last visit was 5 months ago.  Interim history: No increased urination, nausea, chest pain.   He has blurry vision-transitional lenses.    Reviewed HbA1c levels: Lab Results  Component Value Date   HGBA1C 8.7 (A) 12/05/2023   HGBA1C 7.4 (A) 08/04/2023   HGBA1C 7.3 (A) 03/23/2023   He was previously on: Insulin  Before breakfast Before lunch Before dinner Bedtime  Regular 30-35 units 15-20 units 35-38 units -  NPH 35 units -  - 45 units  - Ozempic  0.5 mg weekly (PAP)  We changed to (all through PAP): - Ozempic  1 mg weekly  - Tresiba  >> Levemir  70 units at bedtime >> 40 >> Tresiba  70 >> .SABRA. 44 >> 40 >> 36 units 2x a day - NovoLog  - 15 min before a meal 35 units before each meal >> 25-35 >> 35-40 >> 30-35 (40) units 15-20 min before each meal  (but only 15 to 20 units if you take it after the meal) We tried U500 insulin  but this was too expensive. He did not start Trulicity  due to price - 145$.  Pt checks his sugars  more than 4 times a day with his freestyle libre 2 CGM:  Previously:  Prev.:   Lowest CBG: 42 x1 >> ... 50 >> 56; he has hypoglycemia awareness in the 90s. Highest CBG: 444 >> ...  369 >> 300s >> 295.  Pt's meals are: - Breakfast: cereal + milk + eggs + bacon/sausage - Lunch: egg sandwich - Dinner: meat + veggies + starch - Snacks: 1-2: including after dinner; pears + mayonnaise, milk + crackers; cake  -+ CKD - sees Eric Choi with nephrology, last BUN/creatinine:  Lab Results  Component Value Date   BUN 28 (H) 09/07/2023   CREATININE 2.36 (H) 09/07/2023  02/03/2023: 31/2.33, GFR 28, glucose 163  Lab Results  Component Value Date   MICRALBCREAT 5.5 06/09/2012   MICRALBCREAT 49.5 (H) 12/27/2008   -+ HL; last  set of lipids: Lab Results  Component Value Date   CHOL 72 08/13/2023   HDL 28 (L) 08/13/2023   LDLCALC 19 08/13/2023   LDLDIRECT 69.0 09/05/2020   TRIG 124 08/13/2023   CHOLHDL 2.6 08/13/2023  On Lipitor 40, gemfibrozil  600 bid, Zetia  10.    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 Eric Choi.    - + numbness and tingling in his feet.  Latest foot exam: 04/09/2024 by Eric Choi.  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  He also has HTN. He had right carotid endarterectomy 12/31/2020. He has a history of claudication, which improved.  TSH is slightly elevated in the past but it normalized: Lab Results  Component Value Date   TSH 3.71 06/17/2023   TSH 3.54 06/15/2022   TSH 3.22 06/12/2021   TSH 3.67 08/26/2020   TSH 4.52 (H) 06/11/2020   TSH 4.18 06/05/2019   TSH 3.61 09/08/2018   TSH 4.64 (H) 03/09/2018   TSH 2.90 03/11/2017   TSH 2.69 03/05/2016   TSH 1.62 03/12/2015   TSH 4.04 02/01/2014   TSH 2.38 10/06/2010   TSH 2.80 12/27/2008   He lost his twin  brother to cancer 05/2018.  ROS: see HPI  I reviewed pt's medications, allergies, PMH, social hx, family hx, and changes were documented in the history of present illness. Otherwise, unchanged from my initial visit note.  Past Medical History:  Diagnosis Date   CAD (coronary artery disease)    Colon polyp    DIABETES MELLITUS, TYPE II 11/03/2006   Dyspnea    HYPERLIPIDEMIA 11/03/2006   HYPERTENSION 11/03/2006   Myocardial infarction [facility])    PAD (peripheral artery disease)    Past Surgical History:  Procedure Laterality Date   ABDOMINAL AORTOGRAM N/A 08/11/2023   Procedure: ABDOMINAL AORTOGRAM;  Surgeon: Gretta Lonni PARAS, MD;  Location: [facility] INVASIVE CV LAB;  Service: Cardiovascular;  Laterality: N/A;   AMPUTATION TOE Right 08/15/2023   Procedure: AMPUTATION, TOE;  Surgeon: Malvin Marsa FALCON, DPM;  Location: [facility]  OR;  Service: Orthopedics/Podiatry;  Laterality: Right;  R 2nd toe amp   ANGIOPLASTY  20 years ago   percutaneous transluminal    CATARACT EXTRACTION  2002, 2005   bilateral   ENDARTERECTOMY Right 12/31/2020   Procedure: RIGHT CAROTID ENDARTERECTOMY;  Surgeon: Serene Gaile ORN, MD;  Location: [facility] OR;  Service: Vascular;  Laterality: Right;   LOWER EXTREMITY ANGIOGRAPHY Right 08/11/2023   Procedure: Lower Extremity Angiography;  Surgeon: Gretta Lonni PARAS, MD;  Location: [facility] INVASIVE CV LAB;  Service: Cardiovascular;  Laterality: Right;   LOWER EXTREMITY ANGIOGRAPHY Right 08/12/2023   Procedure: Lower Extremity Angiography;  Surgeon: Magda Debby SAILOR, MD;  Location: [facility] INVASIVE CV LAB;  Service: Cardiovascular;  Laterality: Right;   LOWER EXTREMITY INTERVENTION Right 08/11/2023   Procedure: LOWER EXTREMITY INTERVENTION;  Surgeon: Gretta Lonni PARAS, MD;  Location: [facility] INVASIVE CV LAB;  Service: Cardiovascular;  Laterality: Right;   LOWER EXTREMITY INTERVENTION Right 08/12/2023   Procedure: LOWER EXTREMITY INTERVENTION;  Surgeon: Magda Debby SAILOR, MD;  Location: [facility] INVASIVE CV LAB;  Service: Cardiovascular;  Laterality: Right;   PATCH ANGIOPLASTY Right 12/31/2020   Procedure: PATCH ANGIOPLASTY OF RIGHT CAROTID ARTERY USING XENOSURE BOVINE PERICARDIUM PATCH;  Surgeon: Serene Gaile ORN, MD;  Location: [facility] OR;  Service: Vascular;  Laterality: Right;   PERIPHERAL INTRAVASCULAR LITHOTRIPSY Right 08/12/2023   Procedure: PERIPHERAL INTRAVASCULAR LITHOTRIPSY;  Surgeon: Magda Debby SAILOR, MD;  Location: [facility] INVASIVE CV LAB;  Service: Cardiovascular;  Laterality: Right;   Social History   Socioeconomic History   Marital status: Married    Spouse name: Not on file   Number of children: 2   Years of education: Not on file   Highest education level: Not on file  Occupational History   Occupation: Retired-Truck driver  Tobacco Use   Smoking status: Former    Current packs/day: 0.00    Types: Cigarettes     Quit date: 05/18/1991    Years since quitting: 32.9    Passive exposure: Never   Smokeless tobacco: Never  Vaping Use   Vaping status: Never Used  Substance and Sexual Activity   Alcohol use: No   Drug use: No   Sexual activity: Not Currently  Other Topics Concern   Not on file  Social History Narrative   Works 3rd shift   Regular Exercise-yes   Social Drivers of Corporate Investment Banker Strain: Low Risk  (01/31/2020)   Overall Financial Resource Strain (CARDIA)    Difficulty of Paying Living Expenses: Not hard at all  Food Insecurity: No Food Insecurity (08/17/2023)   Hunger Vital Sign    Worried About Running Out of Food  in the Last Year: Never true    Ran Out of Food in the Last Year: Never true  Transportation Needs: No Transportation Needs (08/17/2023)   PRAPARE - Administrator, Civil Service (Medical): No    Lack of Transportation (Non-Medical): No  Physical Activity: Not on file  Stress: Not on file  Social Connections: Socially Isolated (08/09/2023)   Social Connection and Isolation Panel    Frequency of Communication with Friends and Family: Once a week    Frequency of Social Gatherings with Friends and Family: Once a week    Attends Religious Services: Never    Database Administrator or Organizations: No    Attends Banker Meetings: Never    Marital Status: Married  Catering Manager Violence: Not At Risk (08/17/2023)   Humiliation, Afraid, Rape, and Kick questionnaire    Fear of Current or Ex-Partner: No    Emotionally Abused: No    Physically Abused: No    Sexually Abused: No   Current Outpatient Medications on File Prior to Visit  Medication Sig Dispense Refill   acetaminophen  (TYLENOL ) 500 MG tablet Take 500 mg by mouth every 6 (six) hours as needed for moderate pain.     amLODipine  (NORVASC ) 10 MG tablet TAKE 1 TABLET BY MOUTH DAILY 90 tablet 3   aspirin  81 MG tablet Take 81 mg by mouth daily.     atorvastatin  (LIPITOR) 40 MG tablet  Take 1 tablet (40 mg total) by mouth daily. 90 tablet 3   cholecalciferol  (VITAMIN D3) 25 MCG (1000 UNIT) tablet Take 1,000 Units by mouth daily.     clopidogrel  (PLAVIX ) 75 MG tablet Take 1 tablet (75 mg total) by mouth daily with breakfast. 30 tablet 11   Continuous Blood Gluc Receiver (FREESTYLE LIBRE 2 READER) DEVI Use as instructed 1 each 0   Continuous Glucose Sensor (FREESTYLE LIBRE 2 SENSOR) MISC USE AND CHANGE EVERY 14 DAYS AS DIRECTED 6 each 3   doxycycline  (VIBRAMYCIN ) 100 MG capsule Take 1 capsule (100 mg total) by mouth 2 (two) times daily. 20 capsule 0   ezetimibe  (ZETIA ) 10 MG tablet TAKE 1 TABLET BY MOUTH DAILY 90 tablet 1   famotidine  (PEPCID ) 20 MG tablet Take 20 mg by mouth daily as needed for heartburn or indigestion.     gemfibrozil  (LOPID ) 600 MG tablet TAKE 1 TABLET BY MOUTH DAILY AT 2 P.M. 90 tablet 0   insulin  aspart (NOVOLOG  FLEXPEN) 100 UNIT/ML FlexPen Inject 15-35 Units into the skin 3 (three) times daily before meals. 45 mL 1   insulin  degludec (TRESIBA  FLEXTOUCH) 100 UNIT/ML FlexTouch Pen Inject 56 Units into the skin 2 (two) times daily. 30 mL 3   Insulin  Pen Needle (INSUPEN PEN NEEDLES) 31G X 5 MM MISC Use as directed with insulin . 100 each 5   lisinopril  (ZESTRIL ) 20 MG tablet Take 20 mg by mouth daily.     METAMUCIL FIBER PO Take 1 Dose by mouth daily. 1 dose= 1 tablespoon     metoprolol  tartrate (LOPRESSOR ) 50 MG tablet TAKE 1 TABLET BY MOUTH DAILY 90 tablet 3   Insulin  Syringe-Needle U-100 (DROPLET INSULIN  SYRINGE) 31G X 5/16 1 ML MISC Use three times daily. 300 each 12   sodium bicarbonate  650 MG tablet Take 650 mg by mouth 2 (two) times daily.     No current facility-administered medications on file prior to visit.   No Known Allergies Family History  Problem Relation Age of Onset   COPD Father  Alcohol abuse Father    Diabetes Father    Cancer Sister        breast   Stroke Mother    Colon cancer Neg Hx    PE: BP 136/76   Pulse 72   Resp 18    Ht 5' 6 (1.676 m)   Wt 238 lb 6.4 oz (108.1 kg)   SpO2 96%   BMI 38.48 kg/m    Wt Readings from Last 3 Encounters:  04/17/24 238 lb 6.4 oz (108.1 kg)  12/05/23 234 lb 6.4 oz (106.3 kg)  09/13/23 235 lb (106.6 kg)   Constitutional: overweight, in NAD Eyes: no exophthalmos ENT: no masses palpated in neck, no cervical lymphadenopathy Cardiovascular: RRR, No MRG Respiratory: CTA B Musculoskeletal: no deformities Skin:  no rashes Neurological: no tremor with outstretched hands  ASSESSMENT: 1. DM2, uncontrolled, insulin -dep, with complications - CAD - CKD - PN - DR  - Tried to obtain U500 insulin  but this was too expensive >> we had to go back to N and R insulin   2. HL  3.  Obesity class 1  4.  Elevated TSH  PLAN:  1. Patient with longstanding, uncontrolled, type 2 diabetes, on basal-bolus insulin  regimen and weekly GLP-1 receptor agonist, with fairly good control.  Of note, sugars improved significantly after switching from Levemir  to Tresiba .  He gets his medications through the patient assistance program.   At last visit, HbA1c was higher, at 8.7%, however, per review of his CGM downloads, he is sugars appeared better than expected from his HbA1c, fluctuating mainly within the target range but with hyperglycemic excursions in the week prior to the appointment.  Upon questioning, he was forgetting to take the NovoLog  before meals and he was taking it after meals.  We discussed that this is likely the source of variability.  We did not change his medication doses but I did advise him to try to take the NovoLog  15 to 20 minutes before each meal. CGM interpretation: -At today's visit, we reviewed his CGM downloads: It appears that 76% of values are in target range (goal >70%), while 22% are higher than 180 (goal <25%), and 2% are lower than 70 (goal <4%).  The calculated average blood sugar is 149.  The projected HbA1c for the next 3 months (GMI) is 6.9%. -Reviewing the CGM trends,  sugars appear to be fluctuating mainly within the target range but increasing overnight, with a peak around 7 AM.  Later in the day sugars are fluctuating, occasionally increasing after meals but also dropping after an initial postprandial peak.  This happens in instances when he has to take his NovoLog  before meals and takes it after the meals.  He still forgets to decrease the dose of insulin  if he has to take it later.  I again iterated this and I also advised him to reduce the dose of NovoLog  even if he takes it before meals.  We discussed that when he plans to be more active after a meal, to again decrease the dose of NovoLog .  In the meantime, to improve his blood sugars overnight we decided to move the entire dose of Tresiba  at night and we will slightly reduce the dose to avoid hypoglycemia overnight. -I advised him to: Patient Instructions  Please continue: - Ozempic  1 mg weekly  Change: - Tresiba  60 units daily at night - NovoLog  25-35 units before meals (inject 15 min before meals) Only 15 to 20 units if you take it after the  meal or if you plan to be active after the meal  Please return for another visit in 3-4 months.   - we checked his HbA1c: 8.1% (lower, but higher than expected from his CGM) - advised to check sugars at different times of the day - 4x a day, rotating check times - advised for yearly eye exams >> he is UTD - he has decreased circulation in his feet (known PAD), also, almost no sensation to monofilament and onychomycosis.  He ees Eric Choi with podiatry, last visit 04/09/2024. - return to clinic in 3-4 months  2. HL - Today visited [place] in 07/2023 showed an excellent LDL, a low HDL and triglycerides at goal: Lab Results  Component Value Date   CHOL 72 08/13/2023   HDL 28 (L) 08/13/2023   LDLCALC 19 08/13/2023   LDLDIRECT 69.0 09/05/2020   TRIG 124 08/13/2023   CHOLHDL 2.6 08/13/2023  - He was previously seen in the lipid clinic.  Currently on Lipitor 40 mg  daily, gemfibrozil  600 mg twice a day and Zetia  10 mg daily.  He tolerates these well.  3.  Obesity class 2 -Before the coronavirus pandemic, he was exercising at [employer], but he stopped exercising afterwards due to back pain + sciatica and also leg weakness. - He gained 4 pounds since last visit - Will continue Ozempic  for now  4.  Elevated TSH -He had an elevated TSH in 05/2020, low, TSH normalized afterwards: Lab Results  Component Value Date   TSH 3.71 06/17/2023  - No hypothyroid symptoms - Continue to keep an eye on his TFTs  Lela Fendt, MD PhD [facility]

## 2024-05-01 ENCOUNTER — Telehealth: Payer: Self-pay

## 2024-05-01 NOTE — Telephone Encounter (Signed)
 Patient Assistance  Medication:Ozempic  Dosage:1 mg Quantity:4   Pt has been notified by detailed voicemail.

## 2024-05-07 ENCOUNTER — Telehealth: Payer: Self-pay | Admitting: Internal Medicine

## 2024-05-07 NOTE — Telephone Encounter (Signed)
 Patient's wife, Deny Chevez, came in to office today and picked up 4 boxes of patient assistance Ozempic .

## 2024-05-07 NOTE — Telephone Encounter (Signed)
 Patient's wife, Rashidi Loh, came in to office today and asked if it is time for new patient assistance paper work for [facility] Tresiba  and Novolog .

## 2024-05-08 NOTE — Telephone Encounter (Signed)
 From the looks of the Approval in his chart it said 2026. I have faxed over a refill form for his insulins.

## 2024-05-18 NOTE — Progress Notes (Signed)
 Eric Choi                                          MRN: [id number]   05/18/2024   The [facility]BCI Quality Team Specialist reviewed this patient medical record for the purposes of chart review for care gap closure. The following were reviewed: chart review for care gap closure-kidney health evaluation for diabetes:eGFR  and uACR.    VBCI Quality Team

## 2024-05-23 NOTE — Telephone Encounter (Signed)
 I contacted [employer] at [phone] for a status update on the patients insulin  medications. The patient has been approved to receive Tresiba  U-100 and Novolog  100 U/mL, with coverage confirmed through August 21, 2024.

## 2024-05-24 ENCOUNTER — Other Ambulatory Visit (HOSPITAL_COMMUNITY): Payer: Self-pay

## 2024-05-24 MED ORDER — DAPAGLIFLOZIN PROPANEDIOL 10 MG PO TABS
10.0000 mg | ORAL_TABLET | Freq: Every day | ORAL | 3 refills | Status: AC
  Filled 2024-05-24: qty 30, 30d supply, fill #0

## 2024-05-25 ENCOUNTER — Telehealth: Payer: Self-pay

## 2024-05-25 DIAGNOSIS — E1159 Type 2 diabetes mellitus with other circulatory complications: Secondary | ICD-10-CM

## 2024-05-25 MED ORDER — TRESIBA FLEXTOUCH 100 UNIT/ML ~~LOC~~ SOPN: 60.0000 [IU] | PEN_INJECTOR | Freq: Every day | SUBCUTANEOUS | 0 refills | Status: AC

## 2024-05-25 MED ORDER — NOVOLOG FLEXPEN 100 UNIT/ML ~~LOC~~ SOPN: 25.0000 [IU] | PEN_INJECTOR | Freq: Three times a day (TID) | SUBCUTANEOUS | 0 refills | Status: AC

## 2024-05-25 NOTE — Telephone Encounter (Signed)
 Pt needed 30 day supply sent to pharmacy since pt assistance has not arrived due to the holiday season.

## 2024-05-25 NOTE — Telephone Encounter (Signed)
 Called [employer] at [phone] and spoke with Lorenza, who confirmed that the patients application was accepted and the medication is pending shipment within 10-14 days from 05/09/2024. However, holiday closures may have caused delays. The patient can contact [employer] Immediate Supply Voucher line at [phone] to request a 30-day supply at their convenience. Spoke with the patients wife, Inocente, and informed her of this information. Inocente verbalized understanding.

## 2024-05-31 ENCOUNTER — Other Ambulatory Visit (HOSPITAL_COMMUNITY): Payer: Self-pay

## 2024-06-04 ENCOUNTER — Telehealth: Payer: Self-pay

## 2024-06-04 NOTE — Telephone Encounter (Signed)
 Patient Assistance  Medication:Tresiba   Dosage:U100 Quantity:5  Medication: Novolog  Dosage: U100 Quantity: 9

## 2024-06-05 ENCOUNTER — Telehealth: Payer: Self-pay | Admitting: Internal Medicine

## 2024-06-05 NOTE — Telephone Encounter (Signed)
 MEDICATION: Insulin  pen needles  PHARMACY:   [facility] [address], [place] [address] Phone: [phone]  Fax: [phone]    HAS THE PATIENT CONTACTED THEIR PHARMACY?  Yes  LAST REFILL:  @@LASTREFILL @  IS THIS A 90 DAY SUPPLY :   IS PATIENT OUT OF MEDICATION:   IF NOT; HOW MUCH IS LEFT:   LAST APPOINTMENT DATE: @1 /19/2026  NEXT APPOINTMENT DATE:@4 /11/2024  DO WE HAVE YOUR PERMISSION TO LEAVE A DETAILED MESSAGE?: Yes  OTHER COMMENTS:    **Let patient know to contact pharmacy at the end of the day to make sure medication is ready. **  ** Please notify patient to allow 48-72 hours to process**  **Encourage patient to contact the pharmacy for refills or they can request refills through [facility]**

## 2024-06-05 NOTE — Telephone Encounter (Signed)
 Patient picked up patient assistance - Log Noted Tresiba  and Novolog  on 06/05/24

## 2024-06-06 ENCOUNTER — Other Ambulatory Visit: Payer: Self-pay

## 2024-06-06 MED ORDER — INSUPEN PEN NEEDLES 31G X 5 MM MISC
12 refills | Status: AC
  Filled 2024-06-06: qty 400, 100d supply, fill #0

## 2024-06-07 ENCOUNTER — Other Ambulatory Visit: Payer: Self-pay

## 2024-06-09 ENCOUNTER — Other Ambulatory Visit: Payer: Self-pay | Admitting: Adult Health

## 2024-06-09 DIAGNOSIS — E785 Hyperlipidemia, unspecified: Secondary | ICD-10-CM

## 2024-06-19 ENCOUNTER — Encounter: Payer: PPO | Admitting: Adult Health

## 2024-06-22 ENCOUNTER — Encounter: Admitting: Adult Health

## 2024-06-28 ENCOUNTER — Encounter: Admitting: Adult Health

## 2024-07-09 ENCOUNTER — Ambulatory Visit: Admitting: Podiatry

## 2024-08-21 ENCOUNTER — Ambulatory Visit: Admitting: Internal Medicine
